# Patient Record
Sex: Male | Born: 1950 | Race: Black or African American | Hispanic: No | Marital: Married | Smoking: Former smoker
Health system: Southern US, Community
[De-identification: ages and names within clinical notes are randomized; demographics above are authoritative.]

## PROBLEM LIST (undated history)

## (undated) DIAGNOSIS — Z972 Presence of dental prosthetic device (complete) (partial): Secondary | ICD-10-CM

## (undated) DIAGNOSIS — R7303 Prediabetes: Secondary | ICD-10-CM

## (undated) DIAGNOSIS — K219 Gastro-esophageal reflux disease without esophagitis: Secondary | ICD-10-CM

## (undated) DIAGNOSIS — M1990 Unspecified osteoarthritis, unspecified site: Secondary | ICD-10-CM

## (undated) DIAGNOSIS — E7800 Pure hypercholesterolemia, unspecified: Secondary | ICD-10-CM

## (undated) DIAGNOSIS — E669 Obesity, unspecified: Secondary | ICD-10-CM

## (undated) DIAGNOSIS — I10 Essential (primary) hypertension: Secondary | ICD-10-CM

## (undated) DIAGNOSIS — K259 Gastric ulcer, unspecified as acute or chronic, without hemorrhage or perforation: Secondary | ICD-10-CM

## (undated) DIAGNOSIS — F1910 Other psychoactive substance abuse, uncomplicated: Secondary | ICD-10-CM

## (undated) DIAGNOSIS — H269 Unspecified cataract: Secondary | ICD-10-CM

## (undated) DIAGNOSIS — M2550 Pain in unspecified joint: Secondary | ICD-10-CM

## (undated) DIAGNOSIS — R1310 Dysphagia, unspecified: Secondary | ICD-10-CM

## (undated) DIAGNOSIS — K469 Unspecified abdominal hernia without obstruction or gangrene: Secondary | ICD-10-CM

## (undated) DIAGNOSIS — M549 Dorsalgia, unspecified: Secondary | ICD-10-CM

## (undated) DIAGNOSIS — T7840XA Allergy, unspecified, initial encounter: Secondary | ICD-10-CM

## (undated) DIAGNOSIS — F1010 Alcohol abuse, uncomplicated: Secondary | ICD-10-CM

## (undated) HISTORY — DX: Essential (primary) hypertension: I10

## (undated) HISTORY — DX: Other psychoactive substance abuse, uncomplicated: F19.10

## (undated) HISTORY — PX: JOINT REPLACEMENT: SHX530

## (undated) HISTORY — DX: Gastro-esophageal reflux disease without esophagitis: K21.9

## (undated) HISTORY — PX: REPLACEMENT TOTAL KNEE: SUR1224

## (undated) HISTORY — DX: Pain in unspecified joint: M25.50

## (undated) HISTORY — PX: UMBILICAL HERNIA REPAIR: SHX196

## (undated) HISTORY — DX: Prediabetes: R73.03

## (undated) HISTORY — DX: Unspecified osteoarthritis, unspecified site: M19.90

## (undated) HISTORY — DX: Dysphagia, unspecified: R13.10

## (undated) HISTORY — DX: Alcohol abuse, uncomplicated: F10.10

## (undated) HISTORY — DX: Gastric ulcer, unspecified as acute or chronic, without hemorrhage or perforation: K25.9

## (undated) HISTORY — PX: SHOULDER SURGERY: SHX246

## (undated) HISTORY — DX: Presence of dental prosthetic device (complete) (partial): Z97.2

## (undated) HISTORY — DX: Dorsalgia, unspecified: M54.9

## (undated) HISTORY — DX: Allergy, unspecified, initial encounter: T78.40XA

## (undated) HISTORY — PX: HERNIA REPAIR: SHX51

## (undated) HISTORY — DX: Unspecified abdominal hernia without obstruction or gangrene: K46.9

## (undated) HISTORY — PX: OTHER SURGICAL HISTORY: SHX169

---
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
## 2002-03-07 ENCOUNTER — Encounter: Payer: Self-pay | Admitting: Specialist

## 2002-03-11 ENCOUNTER — Inpatient Hospital Stay (HOSPITAL_COMMUNITY): Admission: RE | Admit: 2002-03-11 | Discharge: 2002-03-13 | Payer: Self-pay | Admitting: Orthopedic Surgery

## 2003-11-13 ENCOUNTER — Ambulatory Visit (HOSPITAL_COMMUNITY): Admission: RE | Admit: 2003-11-13 | Discharge: 2003-11-13 | Payer: Self-pay | Admitting: Gastroenterology

## 2003-11-13 ENCOUNTER — Encounter (INDEPENDENT_AMBULATORY_CARE_PROVIDER_SITE_OTHER): Payer: Self-pay | Admitting: *Deleted

## 2004-09-10 ENCOUNTER — Ambulatory Visit (HOSPITAL_COMMUNITY): Admission: RE | Admit: 2004-09-10 | Discharge: 2004-09-10 | Payer: Self-pay

## 2004-09-10 ENCOUNTER — Ambulatory Visit (HOSPITAL_BASED_OUTPATIENT_CLINIC_OR_DEPARTMENT_OTHER): Admission: RE | Admit: 2004-09-10 | Discharge: 2004-09-10 | Payer: Self-pay

## 2005-02-07 ENCOUNTER — Emergency Department (HOSPITAL_COMMUNITY): Admission: EM | Admit: 2005-02-07 | Discharge: 2005-02-07 | Payer: Self-pay | Admitting: Emergency Medicine

## 2005-04-07 ENCOUNTER — Encounter: Admission: RE | Admit: 2005-04-07 | Discharge: 2005-04-07 | Payer: Self-pay | Admitting: Orthopedic Surgery

## 2008-12-28 ENCOUNTER — Inpatient Hospital Stay (HOSPITAL_COMMUNITY): Admission: RE | Admit: 2008-12-28 | Discharge: 2008-12-31 | Payer: Self-pay | Admitting: Orthopedic Surgery

## 2010-03-01 ENCOUNTER — Emergency Department (HOSPITAL_COMMUNITY)
Admission: EM | Admit: 2010-03-01 | Discharge: 2010-03-01 | Payer: Self-pay | Source: Home / Self Care | Admitting: Family Medicine

## 2010-03-04 LAB — POCT I-STAT, CHEM 8
BUN: 12 mg/dL (ref 6–23)
Calcium, Ion: 1.19 mmol/L (ref 1.12–1.32)
Chloride: 104 mEq/L (ref 96–112)
Creatinine, Ser: 1 mg/dL (ref 0.4–1.5)
Glucose, Bld: 96 mg/dL (ref 70–99)
HCT: 46 % (ref 39.0–52.0)
Hemoglobin: 15.6 g/dL (ref 13.0–17.0)
Potassium: 4 mEq/L (ref 3.5–5.1)
Sodium: 141 mEq/L (ref 135–145)
TCO2: 27 mmol/L (ref 0–100)

## 2010-05-22 LAB — URINALYSIS, ROUTINE W REFLEX MICROSCOPIC
Bilirubin Urine: NEGATIVE
Glucose, UA: NEGATIVE mg/dL
Hgb urine dipstick: NEGATIVE
Ketones, ur: NEGATIVE mg/dL
Nitrite: NEGATIVE
Protein, ur: NEGATIVE mg/dL
Specific Gravity, Urine: 1.017 (ref 1.005–1.030)
Urobilinogen, UA: 0.2 mg/dL (ref 0.0–1.0)
pH: 5.5 (ref 5.0–8.0)

## 2010-05-22 LAB — CBC
HCT: 31.5 % — ABNORMAL LOW (ref 39.0–52.0)
HCT: 32.2 % — ABNORMAL LOW (ref 39.0–52.0)
HCT: 35.5 % — ABNORMAL LOW (ref 39.0–52.0)
HCT: 44.2 % (ref 39.0–52.0)
Hemoglobin: 10.8 g/dL — ABNORMAL LOW (ref 13.0–17.0)
Hemoglobin: 11.3 g/dL — ABNORMAL LOW (ref 13.0–17.0)
Hemoglobin: 12.2 g/dL — ABNORMAL LOW (ref 13.0–17.0)
Hemoglobin: 15 g/dL (ref 13.0–17.0)
MCHC: 33.9 g/dL (ref 30.0–36.0)
MCHC: 34.4 g/dL (ref 30.0–36.0)
MCHC: 34.4 g/dL (ref 30.0–36.0)
MCHC: 35.1 g/dL (ref 30.0–36.0)
MCV: 84.5 fL (ref 78.0–100.0)
MCV: 84.9 fL (ref 78.0–100.0)
MCV: 85.8 fL (ref 78.0–100.0)
MCV: 86.1 fL (ref 78.0–100.0)
Platelets: 159 10*3/uL (ref 150–400)
Platelets: 174 10*3/uL (ref 150–400)
Platelets: 174 10*3/uL (ref 150–400)
Platelets: 176 10*3/uL (ref 150–400)
RBC: 3.67 MIL/uL — ABNORMAL LOW (ref 4.22–5.81)
RBC: 3.81 MIL/uL — ABNORMAL LOW (ref 4.22–5.81)
RBC: 4.12 MIL/uL — ABNORMAL LOW (ref 4.22–5.81)
RBC: 5.2 MIL/uL (ref 4.22–5.81)
RDW: 13 % (ref 11.5–15.5)
RDW: 13.1 % (ref 11.5–15.5)
RDW: 13.1 % (ref 11.5–15.5)
RDW: 14 % (ref 11.5–15.5)
WBC: 13 10*3/uL — ABNORMAL HIGH (ref 4.0–10.5)
WBC: 13.3 10*3/uL — ABNORMAL HIGH (ref 4.0–10.5)
WBC: 13.9 10*3/uL — ABNORMAL HIGH (ref 4.0–10.5)
WBC: 8.1 10*3/uL (ref 4.0–10.5)

## 2010-05-22 LAB — BASIC METABOLIC PANEL
BUN: 9 mg/dL (ref 6–23)
BUN: 9 mg/dL (ref 6–23)
CO2: 28 mEq/L (ref 19–32)
CO2: 29 mEq/L (ref 19–32)
Calcium: 8.4 mg/dL (ref 8.4–10.5)
Calcium: 8.7 mg/dL (ref 8.4–10.5)
Chloride: 102 mEq/L (ref 96–112)
Chloride: 103 mEq/L (ref 96–112)
Creatinine, Ser: 0.98 mg/dL (ref 0.4–1.5)
Creatinine, Ser: 0.99 mg/dL (ref 0.4–1.5)
GFR calc Af Amer: >60 mL/min (ref >60)
GFR calc Af Amer: >60 mL/min (ref >60)
GFR calc non Af Amer: >60 mL/min (ref >60)
GFR calc non Af Amer: >60 mL/min (ref >60)
Glucose, Bld: 136 mg/dL — ABNORMAL HIGH (ref 70–99)
Glucose, Bld: 171 mg/dL — ABNORMAL HIGH (ref 70–99)
Potassium: 4.1 mEq/L (ref 3.5–5.1)
Potassium: 4.9 mEq/L (ref 3.5–5.1)
Sodium: 133 mEq/L — ABNORMAL LOW (ref 135–145)
Sodium: 137 mEq/L (ref 135–145)

## 2010-05-22 LAB — COMPREHENSIVE METABOLIC PANEL
ALT: 29 U/L (ref 0–53)
AST: 30 U/L (ref 0–37)
Albumin: 4.5 g/dL (ref 3.5–5.2)
Alkaline Phosphatase: 68 U/L (ref 39–117)
BUN: 11 mg/dL (ref 6–23)
CO2: 29 mEq/L (ref 19–32)
Calcium: 9.7 mg/dL (ref 8.4–10.5)
Chloride: 104 mEq/L (ref 96–112)
Creatinine, Ser: 1.02 mg/dL (ref 0.4–1.5)
GFR calc Af Amer: >60 mL/min (ref >60)
GFR calc non Af Amer: >60 mL/min (ref >60)
Glucose, Bld: 122 mg/dL — ABNORMAL HIGH (ref 70–99)
Potassium: 4 mEq/L (ref 3.5–5.1)
Sodium: 139 mEq/L (ref 135–145)
Total Bilirubin: 1 mg/dL (ref 0.3–1.2)
Total Protein: 7.8 g/dL (ref 6.0–8.3)

## 2010-05-22 LAB — PROTIME-INR
INR: 1.08 (ref 0.00–1.49)
INR: 1.18 (ref 0.00–1.49)
INR: 1.81 — ABNORMAL HIGH (ref 0.00–1.49)
INR: 1.95 — ABNORMAL HIGH (ref 0.00–1.49)
Prothrombin Time: 13.9 seconds (ref 11.6–15.2)
Prothrombin Time: 14.9 seconds (ref 11.6–15.2)
Prothrombin Time: 20.8 seconds — ABNORMAL HIGH (ref 11.6–15.2)
Prothrombin Time: 22.1 seconds — ABNORMAL HIGH (ref 11.6–15.2)

## 2010-05-22 LAB — TYPE AND SCREEN
ABO/RH(D): O POS
Antibody Screen: NEGATIVE

## 2010-05-22 LAB — APTT: aPTT: 26 seconds (ref 24–37)

## 2010-05-22 LAB — ABO/RH: ABO/RH(D): O POS

## 2010-07-05 NOTE — H&P (Signed)
NAME:  Marvin [name], Marvin [name] NO.:  [id number]   MEDICAL RECORD NO.:  [id number]                   PATIENT TYPE:   LOCATION:                                       FACILITY:   PHYSICIAN:  Ronnell Guadalajara, M.D.                DATE OF BIRTH:   DATE OF ADMISSION:  03/11/2002  DATE OF DISCHARGE:                                HISTORY & PHYSICAL   CHIEF COMPLAINT:  Right knee pain.   HISTORY OF PRESENT ILLNESS:  The patient is a 60 year old male with a long  history of right knee pain.  He had surgery on his right knee back in 1980,  where he had chips removed from his knee arthroscopically.  He has had  continued pain since then.  He has pain constantly, and it has been worse in  the last 3-4 years.  Risks and benefits of the surgery have been discussed  with the patient, and the patient wishes to proceed.   PAST MEDICAL HISTORY:  1. 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  2. Narcotic addiction, none recently.  3. Hypertension.  4. Hypercholesterolemia.  5. Peptic ulcer disease.   PAST SURGICAL HISTORY:  1. Right knee.  2. Right shoulder ruptured biceps.   MEDICATIONS:  1. Lisinopril/HCTZ 20/25 mg 1 p.o. daily  2. Lipitor 10 mg 1 p.o. daily.   ALLERGIES:  No known drug allergies.  The patient states that he does not  want any narcotics due to the previous addiction.   SOCIAL HISTORY:  The patient denies any alcohol or tobacco use.  He is  separated.  He lives in a [place] house with two steps entering the house.   FAMILY HISTORY:  Mother with hypertension.  Father alcoholism.   REVIEW OF SYSTEMS:  GENERAL:  Denies fever, chills, night sweats, bleeding  tendencies.  CNS:  Positive blurry vision.  Denies seizures, headaches,  paralysis, and double vision.  RESPIRATORY:  Denies shortness of breath,  productive cough, hemoptysis.  CV:  Positive chest pain with anxiety.  Denies angina, orthopnea.  GI:  Denies nausea, vomiting, diarrhea,  constipation, or bloody stools.  GU:  Denies dysuria, hematuria, or  discharge.  MUSCULOSKELETAL:  As pertinent to HPI.   PHYSICAL EXAMINATION:  VITAL SIGNS:  Blood pressure 90/50, pulse 88,  respirations 16.  GENERAL:  Well-developed, well-nourished, 60 year old black male.  HEENT:  Normocephalic, atraumatic.  Pupils equal, round, and reactive to  light.  NECK:  Supple.  No carotid bruit noted.  CHEST:  Clear to auscultation bilateral.  No wheezes or crackles.  HEART:  Regular rate and rhythm.  No murmurs, rubs, or gallops.  ABDOMEN:  Soft, nontender, nondistended, positive bowel sounds x 4.  EXTREMITIES:  The patient has bilateral varus knees, Lachman negative, range  of motion to full extension.  No flexion contracture _________ in the  patellofemoral joint region.  SKIN:  No rashes  or lesions.   LABORATORY DATA:  MRI reveals advanced degenerative arthropathy in the  medial compartment with complete hyaline cartilage loss, prominent  osteophyte formation, and marrow edema affecting the medial femoral condyle  and medial tibial plateau.  The patient has had extensive medial  meniscectomy.  Mild chondromalacia in the lateral compartment.  Mild to  moderate chondromalacia in the patellofemoral joint.  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  Intraarticular loose body.   IMPRESSION:  1. Osteoarthritis of the left knee.  2. Narcotics addiction, cocaine addiction.  3. Hypertension.  4. Hypercholesterolemia.  5. Peptic ulcer disease.   PLAN:  The patient will be admitted to [facility] on March 11, 2002, and undergo right Repicci unicondylar knee arthroplasty by Ronnell Guadalajara, M.D.     Clarene Reamer, P.A.-C.                   Ronnell Guadalajara, M.D.    SW/MEDQ  D:  02/28/2002  T:  02/28/2002  Job:  [id number]

## 2010-07-05 NOTE — Op Note (Signed)
NAMEGIFFORD, Marvin [name]             ACCOUNT NO.:  [id number]   MEDICAL RECORD NO.:  [id number]          PATIENT TYPE:  AMB   LOCATION:  ENDO                         FACILITY:  [facility]   PHYSICIAN:  Anselmo Rod, M.D.  DATE OF BIRTH:  04-02-1950   DATE OF PROCEDURE:  11/13/2003  DATE OF DISCHARGE:                                 OPERATIVE REPORT   PROCEDURE PERFORMED:  Colonoscopy with cold biopsies times three.   ENDOSCOPIST:  Charna Elizabeth, M.D.   INSTRUMENT USED:  Olympus video colonoscope.   INDICATIONS FOR PROCEDURE:  The patient is a 60 year old African-American  male undergoing screening colonoscopy to rule out colonic polyps, masses,  etc.   PREPROCEDURE PREPARATION:  Informed consent was procured from the patient.  The patient was fasted for eight hours prior to the procedure and prepped  with a bottle of magnesium citrate and a gallon of GoLYTELY the night prior  to the procedure.   PREPROCEDURE PHYSICAL:  The patient had stable vital signs.  Neck supple.  Chest clear to auscultation.  S1 and S2 regular.  Abdomen soft with normal  bowel sounds.   DESCRIPTION OF PROCEDURE:  The patient was placed in left lateral decubitus  position and sedated with 100 mg of Demerol and 10 mg of Versed in slow  incremental doses.  Once the patient was adequately sedated and maintained  on low flow oxygen and continuous cardiac monitoring, the Olympus video  colonoscope was advanced from the rectum to the cecum.  The appendicular  orifice and ileocecal valve were clearly visualized and photographed.  There  was a significant amount of residual stool in the colon.  Multiple washes  were done.  There was evidence of sigmoid diverticulosis.  Three small  sessile polyps were biopsied from the rectosigmoid colon.  Small internal  hemorrhoids were seen on retroflexion.  No large masses or polyps were  identified.   IMPRESSION:  1.  Small nonbleeding internal hemorrhoids.  2.  Three small  sessile polyps biopsied from rectosigmoid colon.  3.  Sigmoid diverticulosis.   RECOMMENDATIONS:  1.  Await pathology results.  2.  Avoid all nonsteroidals including aspirin for the next two weeks.  3.  Outpatient followup in the next two weeks or earlier if need be.  4.  Brochures on diverticulosis along with a high fiber diet have been given      to the patient for his education.       JNM/MEDQ  D:  11/13/2003  T:  11/13/2003  Job:  [id number]   cc:   Olene Craven, M.D.  [address]  [address]  [place]  [place] [address]  Fax: [phone]

## 2010-07-05 NOTE — Op Note (Signed)
NAME:  OSEI, ANGER NO.:  [id number]   MEDICAL RECORD NO.:  [id number]                   PATIENT TYPE:  INP   LOCATION:  X007                                 FACILITY:  [facility]   PHYSICIAN:  Ronnell Guadalajara, M.D.                DATE OF BIRTH:  1950-12-13   DATE OF PROCEDURE:  03/11/2002  DATE OF DISCHARGE:                                 OPERATIVE REPORT   PREOPERATIVE DIAGNOSIS:  Degenerative arthritis, right knee, primarily  medial compartment.   POSTOPERATIVE DIAGNOSIS:  Degenerative arthritis, right knee, primarily  medial compartment.   OPERATIVE PROCEDURE:  Repicci Unicondylar knee replacement, medial joint.   SURGEON:  Deloris Ping. Montez Morita, M.D.   ASSISTANT:  Georges Lynch. Gioffre, M.D.   DESCRIPTION OF PROCEDURE:  After suitable general anesthesia, he was  positioned in the arthroscopy leg holder and prepped and draped routinely.  After exsanguination with an Esmarch, an upper thigh tourniquet was inflated  to 375 degrees of mercury.  An anteromedial incision is made, parapatellar  in nature.  Skin and subcutaneous tissue was elevated.  A capsule incision  follows that, and a one inch incision towards the medial side of the vastus  medialis proximally to help in folding back the flap.  A very tight and  thick medial collateral ligament throughout and a very degenerated medial  meniscus partially removed right off in the front.  The patella was then  freed up with periosteum and soft tissue and the medial facet removed with a  saw.  A posterior 9 mm of distal femur were removed with a saw.  Remaining  meniscus was removed under direct vision with the aid of a cauterizing  Bovie.  At this point, drill hole was made in the femur and then the tibia  at a 45 degree angle with distraction device inserted and applied.  The end  of the femur was rounded.  The burs were removed; the spurs were removed,  and then we proceeded to bur out the proximal  tibia, electing to take a 37  mm wide as the ideal size.  After it was burred, considerable adjustments, a  nice fit was achieved.  We then checked the alignment on the femur where it  would hit the tibial prosthesis in extension and flexion communicating with  the blue line between, did a trial with the femur, felt that the 54 mm was  ideal, put the guide on and positioned, put the two fixing pins in and used  it as a chisel to deepen its position.  Prior to doing that, we did debride  the femur back an additional 2-3 mm throughout.  A central hole was then  drilled in the guide, and a trial reduction was then carried out, putting  the femur in first and then slipping the two component in.  We tried both  the 6.5 mm thick and a 7.5 and  went with the 6.5.  The wound was then  thoroughly irrigated.  The back of the posterior capsule was injected with  0.25% Marcaine with adrenalin.  There was noted to be a large spur that had  been knocked off the back of the femur, and this was removed.  The cement  was mixed; components were cemented with the tibia first with the knee in a  good bit of flexion to get good fixation into it, positioning into it with a  sponge pack posteriorly to catch the extra cement and the femur second.  After the cement was removed, it was brought out and held in full extension  for a count of 15 and brought back and cement was then removed.  Towards the  end, a large piece of cement was noted to be outside posteromedially and was  removed, all of the excess cement being removed during this setting stage.  Sponge was removed from posterior as well as one that had been placed in the  suprapatellar pouch.  The femur was checked for patella cracking and looked  quite good and had beautiful stability and excellent extension and flexion,  moved quite well.  Thorough  irrigation of the wound with antibiotic solution, repair of the cut in the  vastus medialis and the capsule  with #1 PDS, 2-0 in the subcu, and staples  in the skin.  Marcaine 25% was injected throughout the tissue.  Wound was  closed over a Hemovac.  Ice packs, compression dressing, and was to go  recovery in good condition.                                               Ronnell Guadalajara, M.D.    PC/MEDQ  D:  03/11/2002  T:  03/11/2002  Job:  [id number]

## 2010-07-05 NOTE — Op Note (Signed)
[name], Marvin [name] NO.:  [id number]   MEDICAL RECORD NO.:  [id number]          PATIENT TYPE:  AMB   LOCATION:  NESC                         FACILITY:  [facility]   PHYSICIAN:  Lorre Munroe., M.D.DATE OF BIRTH:  1950-08-17   DATE OF PROCEDURE:  09/10/2004  DATE OF DISCHARGE:                                 OPERATIVE REPORT   PREOPERATIVE DIAGNOSES:  Epigastric and umbilical hernia.   POSTOPERATIVE DIAGNOSES:  Epigastric and umbilical hernia.   OPERATION:  Repair of epigastric and umbilical hernia.   SURGEON:  Lebron Conners, M.D.   ANESTHESIA:  General and local.   DESCRIPTION OF PROCEDURE:  After the patient was monitored and anesthetized  and had routine preparation and draping of the upper abdomen, I made a short  midline incision just above the umbilicus long enough to get above the  palpable epigastric hernia as well as approached the umbilical hernia. I cut  down through the fat until I came to the hernias and found this with very  complex epigastric hernia with three separate defects and an umbilical  hernia as well. I separated all of the hernias from the surrounding  subcutaneous tissues and then connected all of the incisions together  turning the defect into one long defect. I then mobilized the skin and  subcutaneous tissues in the plane just above the fascia for adequate  distance laterally and superiorly and caudad as well so that I could close  the defect with a running 2-0 Prolene suture with minimal tension. There was  good fascia present at all dissected areas. I then cut a piece of  polypropylene mesh to fit the dissected defect and sewed that in place with  running basting 2-0 Prolene suture. I felt that produced a good hernia  repair. I thoroughly anesthetized the operative area with long-acting local  anesthetic. I sewed the umbilical skin down to the mesh with a single 3-0  Vicryl suture. I placed a suction drain through a separate  small stab  incision and laid it on top of the mesh. I then closed the subcutaneous  tissues with running 3-0 Vicryl suture and closed the skin with interrupted  buried intracuticular 4-0 Vicryl and Steri-Strips. He tolerated the  operation well.       WB/MEDQ  D:  09/10/2004  T:  09/10/2004  Job:  [id number]   cc:   Olene Craven, M.D.  [address]  [address]  [place]  [place] [address]  Fax: [phone]

## 2010-08-15 ENCOUNTER — Encounter (HOSPITAL_COMMUNITY)
Admission: RE | Admit: 2010-08-15 | Discharge: 2010-08-15 | Disposition: A | Discharge status: Home / Self Care | Payer: 59 | Source: Ambulatory Visit | Attending: General Surgery | Admitting: General Surgery

## 2010-08-15 ENCOUNTER — Other Ambulatory Visit (INDEPENDENT_AMBULATORY_CARE_PROVIDER_SITE_OTHER): Payer: Self-pay | Admitting: General Surgery

## 2010-08-15 ENCOUNTER — Ambulatory Visit (HOSPITAL_COMMUNITY)
Admission: RE | Admit: 2010-08-15 | Discharge: 2010-08-15 | Disposition: A | Discharge status: Home / Self Care | Payer: 59 | Source: Ambulatory Visit | Attending: General Surgery | Admitting: General Surgery

## 2010-08-15 DIAGNOSIS — K432 Incisional hernia without obstruction or gangrene: Secondary | ICD-10-CM

## 2010-08-15 DIAGNOSIS — Z01812 Encounter for preprocedural laboratory examination: Secondary | ICD-10-CM | POA: Insufficient documentation

## 2010-08-15 DIAGNOSIS — Z01818 Encounter for other preprocedural examination: Secondary | ICD-10-CM | POA: Insufficient documentation

## 2010-08-15 DIAGNOSIS — Z01811 Encounter for preprocedural respiratory examination: Secondary | ICD-10-CM | POA: Insufficient documentation

## 2010-08-15 LAB — BASIC METABOLIC PANEL
BUN: 16 mg/dL (ref 6–23)
CO2: 25 mEq/L (ref 19–32)
Calcium: 8.8 mg/dL (ref 8.4–10.5)
Chloride: 105 mEq/L (ref 96–112)
Creatinine, Ser: 1.05 mg/dL (ref 0.50–1.35)
GFR calc Af Amer: >60 mL/min (ref >60)
GFR calc non Af Amer: >60 mL/min (ref >60)
Glucose, Bld: 98 mg/dL (ref 70–99)
Potassium: 4.5 mEq/L (ref 3.5–5.1)
Sodium: 140 mEq/L (ref 135–145)

## 2010-08-15 LAB — SURGICAL PCR SCREEN
MRSA, PCR: NEGATIVE
Staphylococcus aureus: NEGATIVE

## 2010-08-15 LAB — CBC
HCT: 43 % (ref 39.0–52.0)
Hemoglobin: 15.4 g/dL (ref 13.0–17.0)
MCH: 28.8 pg (ref 26.0–34.0)
MCHC: 35.8 g/dL (ref 30.0–36.0)
MCV: 80.4 fL (ref 78.0–100.0)
Platelets: 203 10*3/uL (ref 150–400)
RBC: 5.35 MIL/uL (ref 4.22–5.81)
RDW: 13.8 % (ref 11.5–15.5)
WBC: 8.8 10*3/uL (ref 4.0–10.5)

## 2010-08-15 LAB — DIFFERENTIAL
Basophils Absolute: 0 10*3/uL (ref 0.0–0.1)
Basophils Relative: 0 % (ref 0–1)
Eosinophils Absolute: 0.2 10*3/uL (ref 0.0–0.7)
Eosinophils Relative: 2 % (ref 0–5)
Lymphocytes Relative: 22 % (ref 12–46)
Lymphs Abs: 1.9 10*3/uL (ref 0.7–4.0)
Monocytes Absolute: 0.5 10*3/uL (ref 0.1–1.0)
Monocytes Relative: 6 % (ref 3–12)
Neutro Abs: 6.2 10*3/uL (ref 1.7–7.7)
Neutrophils Relative %: 71 % (ref 43–77)

## 2010-08-19 ENCOUNTER — Ambulatory Visit (HOSPITAL_COMMUNITY)
Admission: RE | Admit: 2010-08-19 | Discharge: 2010-08-19 | Disposition: A | Discharge status: Home / Self Care | Payer: 59 | Source: Ambulatory Visit | Attending: General Surgery | Admitting: General Surgery

## 2010-08-19 DIAGNOSIS — K430 Incisional hernia with obstruction, without gangrene: Secondary | ICD-10-CM | POA: Insufficient documentation

## 2010-08-19 DIAGNOSIS — M109 Gout, unspecified: Secondary | ICD-10-CM | POA: Insufficient documentation

## 2010-08-19 DIAGNOSIS — K432 Incisional hernia without obstruction or gangrene: Secondary | ICD-10-CM

## 2010-08-19 DIAGNOSIS — Z01818 Encounter for other preprocedural examination: Secondary | ICD-10-CM | POA: Insufficient documentation

## 2010-08-19 DIAGNOSIS — Z87891 Personal history of nicotine dependence: Secondary | ICD-10-CM | POA: Insufficient documentation

## 2010-08-19 DIAGNOSIS — I10 Essential (primary) hypertension: Secondary | ICD-10-CM | POA: Insufficient documentation

## 2010-08-19 DIAGNOSIS — Z01812 Encounter for preprocedural laboratory examination: Secondary | ICD-10-CM | POA: Insufficient documentation

## 2010-08-19 DIAGNOSIS — Z01810 Encounter for preprocedural cardiovascular examination: Secondary | ICD-10-CM | POA: Insufficient documentation

## 2010-08-20 NOTE — Op Note (Signed)
NAMELAVARR, PRESIDENT NO.:  [id number]  MEDICAL RECORD NO.:  [id number]  LOCATION:  5151                         FACILITY:  [facility]  PHYSICIAN:  Cherylynn Ridges, M.D.    DATE OF BIRTH:  09-Jul-1950  DATE OF PROCEDURE:  08/19/2010 DATE OF DISCHARGE:  08/19/2010                              OPERATIVE REPORT   PREOPERATIVE DIAGNOSIS:  Incisional ventral hernia.  POSTOPERATIVE DIAGNOSIS:  Incisional ventral hernia.  PROCEDURE:  Laparoscopic ventral hernia repair x2.  SURGEON:  Cherylynn Ridges, MD  Physio mesh was used in repair.  ASSISTANT:  Dr. Andrey Campanile  ANESTHESIA:  General endotracheal.  ESTIMATED BLOOD LOSS:  Less than 50 mL.  COMPLICATIONS:  None.  CONDITION:  Stable.  FINDINGS:  The primary defect was in the midportion of the incision about 3 cm defect and in the superior aspect of the abdominal wall near the falciform ligament.  There was also 2 cm defect that was repaired separately.  INDICATIONS FOR OPERATION:  The patient is a 60 year old gentleman with a ventral hernia that is symptomatic with incarcerated fat.  OPERATION:  The patient was taken to the operating room, placed on table in supine position.  After an adequate general and endotracheal anesthetic was administered, he was prepped and draped in usual sterile manner exposing his entire abdomen.  After appropriate time-out was performed identifying the patient and the procedure to be performed, we made our initial left upper quadrant transverse incision about a 1.5 cm long into the subcutaneous tissue. We then used an OptiVu cannula 11 and 12 mm cannula with an inserter, laparoscope was attached with camera in light source and slowly entered the peritoneal cavity safely using the OptiVu.  Once we were in the peritoneal cavity, we insufflated carbon dioxide gas up to a pressure of 50 mmHg.  The right side was tilted down somewhat.  After we inserted the laparoscope with attached camera  in light source, we placed our right lower quadrant 5-mm cannula and the left lower quadrant 5-mm cannula under direct vision into the peritoneal cavity. We then dissected out the omentum and fat that was in the hernia defect in the midportion of the incision.  Electrocautery and laparoscopic scissors were used for this procedure.  Once we had done so, we also took down the falciform ligament up towards the liver in order to gain adequate access while placement of mesh.  After we identified the defect, there was still some omentum and fatty tissue attached to the posterior portion of what was previously placed a piece of mesh.  We dissected this and subsequently retrieved using an EndoCatch bag from the cannula site in the left upper quadrant.  Once we had cleaned off the previous fascia and also the defect, we measured the edges of the previous fascia using a spinal needle, marking pen and came out that we needed a 15 x 20 cm piece of Physio mesh in order to cover the defect.  It was not noticed at that time just above where we had measured there was a small 2 cm defect that we subsequently had to cover.  We used 0 Novafil sutures in aid, equally spaced placed on  the edge of the Physio mesh and then inserted it through the left upper quadrant cannula.  We then used a suture retriever through equally spaced transverse incision about 0.5 cm in size and pulled it up to the abdominal wall.  We tied these down and subsequently tacked it using a secure strap tacker circumferentially.  It was during the portion of the tacking in the upper portion that we noticed 2 cm defect.  This was indeed a separate hernia defect from the previous one.  We used a 6 x 4 cm piece of Physio mesh in order to cover this defect without any retention sutures.  We passed it through the left upper quadrant site and then tacked it superior lateral and circumferentially around the defect using a secure strap tacker.   With this all hernia defects were covered.  There was minimal bleeding.  As mentioned previously, we retrieved the fat from the left upper quadrant site.  Once that was done, we allowed the gas to escape and we closed.  The only skin suture was in the left upper quadrant site what we closed using running subcuticular stitch of 4-0 Monocryl.  We injected 0.25% Marcaine with epi at all sites.  We used Dermabond, Steri-Strips and Tegaderm to complete all dressings.  All needle counts, sponge counts and instrument counts were correct.     Cherylynn Ridges, M.D.     JOW/MEDQ  D:  08/19/2010  T:  08/19/2010  Job:  [id number]  cc:   Merlene Laughter. Renae Gloss, M.D.  Electronically Signed by Jimmye Norman M.D. on 08/20/2010 08:20:47 AM

## 2010-08-27 ENCOUNTER — Telehealth (INDEPENDENT_AMBULATORY_CARE_PROVIDER_SITE_OTHER): Payer: Self-pay

## 2010-08-27 MED ORDER — HYDROCODONE-ACETAMINOPHEN 5-325 MG PO TABS: 1.0000 | ORAL_TABLET | Freq: Four times a day (QID) | ORAL | Status: AC | PRN

## 2010-08-27 NOTE — Telephone Encounter (Signed)
Per protocol faxed to [facility] [phone]

## 2010-09-03 ENCOUNTER — Ambulatory Visit (INDEPENDENT_AMBULATORY_CARE_PROVIDER_SITE_OTHER): Payer: 59 | Admitting: General Surgery

## 2010-09-03 ENCOUNTER — Encounter (INDEPENDENT_AMBULATORY_CARE_PROVIDER_SITE_OTHER): Payer: Self-pay | Admitting: General Surgery

## 2010-09-03 VITALS — BP 170/116 | HR 88 | Temp 96.2°F

## 2010-09-03 DIAGNOSIS — Z09 Encounter for follow-up examination after completed treatment for conditions other than malignant neoplasm: Secondary | ICD-10-CM | POA: Insufficient documentation

## 2010-09-03 NOTE — Progress Notes (Signed)
HPI The patient is status post laparoscopic ventral hernia repair. On examination he has no evidence of recurrence. At the time of surgery he had 2 hernias one around the umbilicus and one in the subxiphoid area.  PE His exam is lifting for no evidence of recurrent hernia. He felt as on the right side the inferior periumbilical area they had a bulge of right did not see evidence of recurrent hernia in that area. I will prescribe him an abdominal binder again.  Studiy review There were no studies to review today.  Assessment Status post laparoscopic ventral hernia repair with no evidence for recurrence.  Plan Prescribed the patient another abdominal binder. He is to return to see me on a p.r.n. basis

## 2010-09-05 ENCOUNTER — Other Ambulatory Visit (INDEPENDENT_AMBULATORY_CARE_PROVIDER_SITE_OTHER): Payer: Self-pay

## 2010-09-05 DIAGNOSIS — K469 Unspecified abdominal hernia without obstruction or gangrene: Secondary | ICD-10-CM

## 2010-09-09 MED ORDER — OXYCODONE-ACETAMINOPHEN 5-325 MG PO TABS: 1.0000 | ORAL_TABLET | Freq: Four times a day (QID) | ORAL | Status: DC | PRN

## 2010-10-01 ENCOUNTER — Encounter (INDEPENDENT_AMBULATORY_CARE_PROVIDER_SITE_OTHER): Payer: Self-pay

## 2011-01-06 ENCOUNTER — Other Ambulatory Visit: Payer: Self-pay

## 2011-01-06 ENCOUNTER — Encounter (HOSPITAL_COMMUNITY): Payer: Self-pay | Admitting: *Deleted

## 2011-01-06 ENCOUNTER — Emergency Department (HOSPITAL_COMMUNITY): Payer: 59

## 2011-01-06 ENCOUNTER — Emergency Department (HOSPITAL_COMMUNITY)
Admission: EM | Admit: 2011-01-06 | Discharge: 2011-01-06 | Disposition: A | Discharge status: Home / Self Care | Payer: 59 | Attending: Emergency Medicine | Admitting: Emergency Medicine

## 2011-01-06 DIAGNOSIS — I10 Essential (primary) hypertension: Secondary | ICD-10-CM | POA: Insufficient documentation

## 2011-01-06 DIAGNOSIS — R079 Chest pain, unspecified: Secondary | ICD-10-CM | POA: Insufficient documentation

## 2011-01-06 DIAGNOSIS — R0602 Shortness of breath: Secondary | ICD-10-CM | POA: Insufficient documentation

## 2011-01-06 DIAGNOSIS — E7800 Pure hypercholesterolemia, unspecified: Secondary | ICD-10-CM | POA: Insufficient documentation

## 2011-01-06 HISTORY — DX: Pure hypercholesterolemia, unspecified: E78.00

## 2011-01-06 LAB — POCT I-STAT TROPONIN I
Troponin i, poc: 0 ng/mL (ref 0.00–0.08)
Troponin i, poc: 0.04 ng/mL (ref 0.00–0.08)
Troponin i, poc: 0.01 ng/mL (ref 0.00–0.08)

## 2011-01-06 LAB — BASIC METABOLIC PANEL
BUN: 14 mg/dL (ref 6–23)
CO2: 26 mEq/L (ref 19–32)
Calcium: 9.5 mg/dL (ref 8.4–10.5)
Chloride: 106 mEq/L (ref 96–112)
Creatinine, Ser: 0.97 mg/dL (ref 0.50–1.35)
GFR calc Af Amer: >90 mL/min (ref >90)
GFR calc non Af Amer: 90 mL/min — ABNORMAL LOW (ref >90)
Glucose, Bld: 96 mg/dL (ref 70–99)
Potassium: 4 mEq/L (ref 3.5–5.1)
Sodium: 139 mEq/L (ref 135–145)

## 2011-01-06 LAB — CBC
HCT: 40.9 % (ref 39.0–52.0)
Hemoglobin: 13.9 g/dL (ref 13.0–17.0)
MCH: 27.9 pg (ref 26.0–34.0)
MCHC: 34 g/dL (ref 30.0–36.0)
MCV: 82 fL (ref 78.0–100.0)
Platelets: 177 10*3/uL (ref 150–400)
RBC: 4.99 MIL/uL (ref 4.22–5.81)
RDW: 14.6 % (ref 11.5–15.5)
WBC: 9.5 10*3/uL (ref 4.0–10.5)

## 2011-01-06 LAB — APTT: aPTT: 27 seconds (ref 24–37)

## 2011-01-06 LAB — PRO B NATRIURETIC PEPTIDE: Pro B Natriuretic peptide (BNP): 17.1 pg/mL (ref 0–125)

## 2011-01-06 LAB — PROTIME-INR
INR: 1.06 (ref 0.00–1.49)
Prothrombin Time: 14 seconds (ref 11.6–15.2)

## 2011-01-06 LAB — D-DIMER, QUANTITATIVE: D-Dimer, Quant: 1.25 ug/mL-FEU — ABNORMAL HIGH (ref 0.00–0.48)

## 2011-01-06 MED ORDER — IOHEXOL 300 MG/ML  SOLN
100.0000 mL | Freq: Once | INTRAMUSCULAR | Status: AC | PRN
  Administered 2011-01-06: 100 mL via INTRAVENOUS

## 2011-01-06 MED ORDER — FENTANYL CITRATE 0.05 MG/ML IJ SOLN
50.0000 ug | Freq: Once | INTRAMUSCULAR | Status: AC
Start: 2011-01-06 — End: 2011-01-06
  Administered 2011-01-06: 50 ug via INTRAVENOUS
  Filled 2011-01-06: qty 2

## 2011-01-06 MED ORDER — ASPIRIN 81 MG PO CHEW
162.0000 mg | CHEWABLE_TABLET | Freq: Once | ORAL | Status: AC
  Administered 2011-01-06: 162 mg via ORAL
  Filled 2011-01-06: qty 2

## 2011-01-06 MED ORDER — OXYCODONE-ACETAMINOPHEN 5-325 MG PO TABS: ORAL_TABLET | ORAL | Status: DC

## 2011-01-06 MED ORDER — NITROGLYCERIN 0.4 MG SL SUBL
0.4000 mg | SUBLINGUAL_TABLET | SUBLINGUAL | Status: DC | PRN
  Administered 2011-01-06 (×2): 0.4 mg via SUBLINGUAL
  Filled 2011-01-06: qty 25

## 2011-01-06 MED ORDER — KETOROLAC TROMETHAMINE 30 MG/ML IJ SOLN
30.0000 mg | Freq: Once | INTRAMUSCULAR | Status: AC
  Administered 2011-01-06: 30 mg via INTRAVENOUS
  Filled 2011-01-06: qty 1

## 2011-01-06 MED ORDER — SODIUM CHLORIDE 0.9 % IV SOLN
Freq: Once | INTRAVENOUS | Status: AC
  Administered 2011-01-06 (×2): via INTRAVENOUS

## 2011-01-06 MED ORDER — NITROGLYCERIN 0.4 MG SL SUBL
SUBLINGUAL_TABLET | SUBLINGUAL | Status: AC
  Administered 2011-01-06: 0.4 mg via SUBLINGUAL
  Filled 2011-01-06: qty 25

## 2011-01-06 NOTE — Discharge Instructions (Signed)
 Chest Pain (Nonspecific) It is often hard to give a specific diagnosis for the cause of chest pain. There is always a chance that your pain could be related to something serious, such as a heart attack or a blood clot in the lungs. You need to follow up with your caregiver for further evaluation. CAUSES   Heartburn.   Pneumonia or bronchitis.   Anxiety and stress.   Inflammation around your heart (pericarditis) or lung (pleuritis or pleurisy).   A blood clot in the lung.   A collapsed lung (pneumothorax). It can develop suddenly on its own (spontaneous pneumothorax) or from injury (trauma) to the chest.  The chest wall is composed of bones, muscles, and cartilage. Any of these can be the source of the pain.  The bones can be bruised by injury.   The muscles or cartilage can be strained by coughing or overwork.   The cartilage can be affected by inflammation and become sore (costochondritis).  DIAGNOSIS  Lab tests or other studies, such as X-rays, an EKG, stress testing, or cardiac imaging, may be needed to find the cause of your pain.  TREATMENT   Treatment depends on what may be causing your chest pain. Treatment may include:   Acid blockers for heartburn.   Anti-inflammatory medicine.   Pain medicine for inflammatory conditions.   Antibiotics if an infection is present.   You may be advised to change lifestyle habits. This includes stopping smoking and avoiding caffeine and chocolate.   You may be advised to keep your head raised (elevated) when sleeping. This reduces the chance of acid going backward from your stomach into your esophagus.   Most of the time, nonspecific chest pain will improve within 2 to 3 days with rest and mild pain medicine.  HOME CARE INSTRUCTIONS   If antibiotics were prescribed, take the full amount even if you start to feel better.   For the next few days, avoid physical activities that bring on chest pain. Continue physical activities as  directed.   Do not smoke cigarettes or drink alcohol  until your symptoms are gone.   Only take over-the-counter or prescription medicine for pain, discomfort, or fever as directed by your caregiver.   Follow your caregiver's suggestions for further testing if your chest pain does not go away.   Keep any follow-up appointments you made. If you do not go to an appointment, you could develop lasting (chronic) problems with pain. If there is any problem keeping an appointment, you must call to reschedule.  SEEK MEDICAL CARE IF:   You think you are having problems from the medicine you are taking. Read your medicine instructions carefully.   Your chest pain does not go away, even after treatment.   You develop a rash with blisters on your chest.  SEEK IMMEDIATE MEDICAL CARE IF:   You have increased chest pain or pain that spreads to your arm, neck, jaw, back, or belly (abdomen).   You develop shortness of breath, an increasing cough, or you are coughing up blood.   You have severe back or abdominal pain, feel sick to your stomach (nauseous) or throw up (vomit).   You develop severe weakness, fainting, or chills.   You have an oral temperature above 102 F (38.9 C), not controlled by medicine.  THIS IS AN EMERGENCY. Do not wait to see if the pain will go away. Get medical help at once. Call your local emergency services (911 in [place] Do not drive yourself to  the hospital. MAKE SURE YOU:   Understand these instructions.   Will watch your condition.   Will get help right away if you are not doing well or get worse.  Document Released: 11/13/2004 Document Revised: 10/16/2010 Document Reviewed: 09/09/2007 [facility] Patient Information 2012 [place], [place].     Narcotic and benzodiazepine use may cause drowsiness, slowed breathing or dependence.  Please use with caution and do not drive, operate machinery or watch young children alone while taking them.  Taking combinations of these  medications or drinking alcohol  will potentiate these effects.      Your blood tests for heart damage and your CT scan showed no immediate abnormalities.

## 2011-01-06 NOTE — ED Notes (Signed)
Patient transported to X-ray 

## 2011-01-06 NOTE — ED Notes (Signed)
Pt c/o chest pain starting yesterday evening, shortness of breath starting @ midnight. Pt describes pain as sharp and shooting, radiating from lateral left chest to back.

## 2011-01-06 NOTE — ED Notes (Signed)
Ghim, MD at bedside. 

## 2011-01-06 NOTE — ED Notes (Signed)
Pt reports left upper chest pain radiating to back that began yesterday evening approx 22:00 - pt was at rest - admits to increased pain w/ movement and inspiration. Pt denies n/v, diaphoresis, or dizziness. Pt admits to recent domestic travel. Pt resting comfortably on bed in no acute distress, family at bedside.

## 2011-01-06 NOTE — ED Provider Notes (Addendum)
History     CSN: [id number] Arrival date & time: 01/06/2011  3:09 AM   First MD Initiated Contact with Patient 01/06/11 [phone]      Chief Complaint  Patient presents with  . Chest Pain    reproducible w/ palpation and inspiration  . Shortness of Breath    (Consider location/radiation/quality/duration/timing/severity/associated sxs/prior treatment) HPI Comments: Patient presents to the emergency department due to intermittent sharp left-sided chest pains. It does not radiate. He reports shortness of breath. There is no pleuritic component. No nausea vomiting or diarrhea. Patient has had some mild constipation for 2 days which is not any different from usual. Of note he did travel to [facility] to attend a funeral on Friday and arrived back home yesterday. He denies any calf tenderness, lower extremity with numbness or swelling. He denies history of cardiac disease. He denies any recent URI symptoms, cough. He denies any back pain. He did take one baby aspirin prior to arrival. He also recently had a ventral hernia surgery in July of this past year. He had a right knee replacement about 2 years ago. He denies smoking. He has significant history of hypertension and hyperlipidemia. He reports that his grandfather had a heart attack when he was approximately 60 years old but no other family members with a history of cardiac disease.  The history is provided by the patient.    Past Medical History  Diagnosis Date  . Wears dentures     bottom front  . Hypertension   . Hernia   . Arthritis   . Hypercholesteremia   . Gout     Past Surgical History  Procedure Date  . Shoulder surgery     right  . Replacement total knee     partial  . Hernia repair     History reviewed. No pertinent family history.  History  Substance Use Topics  . Smoking status: Former Games developer  . Smokeless tobacco: Not on file  . Alcohol Use: No      Review of Systems  Constitutional: Negative.   HENT:  Negative for congestion and rhinorrhea.   Respiratory: Positive for shortness of breath. Negative for cough.   Cardiovascular: Positive for chest pain. Negative for leg swelling.  Musculoskeletal: Negative for back pain.  All other systems reviewed and are negative.    Allergies  Morphine and related  Home Medications   Current Outpatient Rx  Name Route Sig Dispense Refill  . AMLODIPINE BESYLATE 5 MG PO TABS Oral Take 5 mg by mouth daily.      . ASPIRIN 81 MG PO TABS Oral Take 81 mg by mouth daily.      Marland Kitchen SIMVASTATIN 40 MG PO TABS Oral Take 40 mg by mouth at bedtime.      . ALLOPURINOL PO Oral Take by mouth daily.      Marland Kitchen LOTREL PO Oral Take by mouth daily.      . AMOXICILLIN 500 MG PO CAPS      . VYTORIN PO Oral Take by mouth daily.      Marland Kitchen FLUTICASONE PROPIONATE 50 MCG/ACT NA SUSP      . OXYCODONE-ACETAMINOPHEN 5-325 MG PO TABS Oral Take 1 tablet by mouth every 6 (six) hours as needed for pain. 20 tablet 0    BP 129/91  Pulse 58  Resp 23  Ht 5\' 11"  (1.803 m)  Wt 246 lb 0.5 oz (111.6 kg)  BMI 34.31 kg/m2  SpO2 100%  Physical Exam  Nursing note and  vitals reviewed. Constitutional: He is oriented to person, place, and time. He appears well-developed and well-nourished. No distress.  HENT:  Head: Normocephalic and atraumatic.  Eyes: Pupils are equal, round, and reactive to light.  Neck: Neck supple.  Cardiovascular: Normal rate and regular rhythm.   Pulmonary/Chest: Effort normal. He has no wheezes. He has no rales.  Abdominal: Soft. There is no tenderness.  Musculoskeletal: He exhibits no edema and no tenderness.  Neurological: He is alert and oriented to person, place, and time.  Skin: Skin is warm and dry. No rash noted. He is not diaphoretic.  Psychiatric: He has a normal mood and affect.    ED Course  Procedures (including critical care time)   Labs Reviewed  CBC  BASIC METABOLIC PANEL  PRO B NATRIURETIC PEPTIDE  I-STAT TROPONIN I  D-DIMER, QUANTITATIVE    APTT  PROTIME-INR   Dg Chest 2 View  01/06/2011  *RADIOLOGY REPORT*  Clinical Data: Sudden onset of chest pain.  CHEST - 2 VIEW  Comparison: 08/15/2010  Findings: No significant change from prior.  Hypoaeration results in interstitial crowding.  Mildly tortuous thoracic aorta.  The cardiac contour upper normal limits to mildly enlarged.  Elevated left hemidiaphragm.  No pleural effusion or pneumothorax.  Plate like opacity within the right mid lung may reflect atelectasis.  No acute osseous abnormality.  IMPRESSION: Hypoaeration results in interstitial crowding and mild atelectasis.  Heart size upper normal limits to mildly enlarged.  Original Report Authenticated By: Waneta Martins, M.D.     No diagnosis found.    MDM   Are reviewed patient's chest x-ray. Per radiologist there is no acute abnormality except for mild atelectasis. Heart size was noted to be slightly enlarged. Radiologist also mentioned a mildly tortuous thoracic aorta. Patient's symptoms are atypical in that it is sharp in nature. It occurred while he was at rest. His EKG here shows no acute abnormalities. The EKG was done at 03:17. It shows a sinus rhythm at rate 59 with normal axis, normal intervals, no ST or T wave changes. There no prior EKGs noted. This is a normal EKG. Although there is no pleuritic pain given his recent long distance travel, d-dimer is also added. His room air saturation was 100% which is normal. Patient is given additional aspirin and nitroglycerin and will continue to monitor him. He'll be reassessed for pain improvement. His primary care physician is Dr. Andi Devon.      6:52 AM CT chest angio shows no PE.  Initial troponin is neg.  Pt with some improvement with NTG and IV fentanyl.  Continued mild CP.  Some risk factors for CAD.  Pt's pain is improved and now seems worse with movement.  Pain seems to be more reproducible.  I think given this and the sharp nature of the pain, this is more  likely muscular.  Will get a second troponin, if neg, will d/c home and close follow up with PCP.     08:00 AM Pt's second troponin i stat is up from 0.0 to 0.04.  Slight rise, will get third troponin.  Suspicion is low as it is reproducible.  Pt discussed with Dr. Hyman Hopes.    Gavin Pound. Oletta Lamas, MD 01/08/11 1055  Gavin Pound. Oletta Lamas, MD 03/06/11 2027

## 2011-01-06 NOTE — ED Notes (Signed)
Assumption of care from Dr. Oletta Lamas.  60 y.o. male presents with  Chest pain unclear etiology. Dispo pending 3rd troponin. Trop negative x 3. Will discharge with plan set forth by Dr. Oletta Lamas- Percocet, PMD FU  BP 135/71  Pulse 60  Temp 97.6 F (36.4 C)  Resp 19  Ht 5\' 11"  (1.803 m)  Wt 246 lb 0.5 oz (111.6 kg)  BMI 34.31 kg/m2  SpO2 100%  Results for orders placed during the hospital encounter of 01/06/11  CBC      Component Value Range   WBC 9.5  4.0 - 10.5 (K/uL)   RBC 4.99  4.22 - 5.81 (MIL/uL)   Hemoglobin 13.9  13.0 - 17.0 (g/dL)   HCT 46.9  62.9 - 52.8 (%)   MCV 82.0  78.0 - 100.0 (fL)   MCH 27.9  26.0 - 34.0 (pg)   MCHC 34.0  30.0 - 36.0 (g/dL)   RDW 41.3  24.4 - 01.0 (%)   Platelets 177  150 - 400 (K/uL)  BASIC METABOLIC PANEL      Component Value Range   Sodium 139  135 - 145 (mEq/L)   Potassium 4.0  3.5 - 5.1 (mEq/L)   Chloride 106  96 - 112 (mEq/L)   CO2 26  19 - 32 (mEq/L)   Glucose, Bld 96  70 - 99 (mg/dL)   BUN 14  6 - 23 (mg/dL)   Creatinine, Ser 2.72  0.50 - 1.35 (mg/dL)   Calcium 9.5  8.4 - 53.6 (mg/dL)   GFR calc non Af Amer 90 (*) >90 (mL/min)   GFR calc Af Amer >90  >90 (mL/min)  PRO B NATRIURETIC PEPTIDE      Component Value Range   BNP, POC 17.1  0 - 125 (pg/mL)  D-DIMER, QUANTITATIVE      Component Value Range   D-Dimer, Quant 1.25 (*) 0.00 - 0.48 (ug/mL-FEU)  APTT      Component Value Range   aPTT 27  24 - 37 (seconds)  PROTIME-INR      Component Value Range   Prothrombin Time 14.0  11.6 - 15.2 (seconds)   INR 1.06  0.00 - 1.49   POCT I-STAT TROPONIN I      Component Value Range   Troponin i, poc 0.00  0.00 - 0.08 (ng/mL)   Comment 3           POCT I-STAT TROPONIN I      Component Value Range   Troponin i, poc 0.04  0.00 - 0.08 (ng/mL)   Comment 3           POCT I-STAT TROPONIN I      Component Value Range   Troponin i, poc 0.01  0.00 - 0.08 (ng/mL)   Comment 3            Dg Chest 2 View  01/06/2011  *RADIOLOGY REPORT*  Clinical  Data: Sudden onset of chest pain.  CHEST - 2 VIEW  Comparison: 08/15/2010  Findings: No significant change from prior.  Hypoaeration results in interstitial crowding.  Mildly tortuous thoracic aorta.  The cardiac contour upper normal limits to mildly enlarged.  Elevated left hemidiaphragm.  No pleural effusion or pneumothorax.  Plate like opacity within the right mid lung may reflect atelectasis.  No acute osseous abnormality.  IMPRESSION: Hypoaeration results in interstitial crowding and mild atelectasis.  Heart size upper normal limits to mildly enlarged.  Original Report Authenticated By: Waneta Martins, M.D.   Ct Angio  Chest W/cm &/or Wo Cm  01/06/2011  *RADIOLOGY REPORT*  Clinical Data:  Chest pain, shortness of breath.  CT ANGIOGRAPHY CHEST WITH CONTRAST  Technique:  Multidetector CT imaging of the chest was performed using the standard protocol during bolus administration of intravenous contrast.  Multiplanar CT image reconstructions including MIPs were obtained to evaluate the vascular anatomy.  Contrast: OMNIPAQUE IOHEXOL 300 MG/ML IV SOLN  Comparison:  01/06/2011 radiograph  Findings:   No main or lobar branch pulmonary embolism.  Suboptimal evaluation of segmental and subsegmental branches.  Tortuous thoracic aorta with scattered atherosclerotic calcification.  Heart size upper normal limits to mildly enlarged. Mild coronary artery calcification.  No pleural effusion.  No pericardial effusion.  No intrathoracic lymphadenopathy.  Central airways are patent.  Dependent atelectasis.  Mild interlobular septal prominence.  No pneumothorax. Mild apical emphysematous changes.  Limited images through the upper abdomen show no acute abnormality.  No acute osseous abnormality.  Review of the MIP images confirms the above findings.  IMPRESSION: No pulmonary embolism identified. Segmental and subsegmental branches are suboptimally evaluated due to contrast bolus timing.  Mild interlobular septal  prominence may represent chronic changes or mild edema pattern.  Mild bibasilar atelectasis.  Original Report Authenticated By: Waneta Martins, M.D.        Forbes Cellar, MD 01/06/11 1118

## 2011-01-06 NOTE — ED Notes (Signed)
Pt alert and oriented x4. Respirations even and unlabored, bilateral symmetrical rise and fall of chest. Skin warm and dry. In no acute distress. Denies needs.   

## 2011-01-06 NOTE — ED Notes (Signed)
Patient transported to CT 

## 2011-05-08 ENCOUNTER — Encounter (HOSPITAL_COMMUNITY): Payer: Self-pay

## 2011-05-08 ENCOUNTER — Emergency Department (INDEPENDENT_AMBULATORY_CARE_PROVIDER_SITE_OTHER)
Admission: EM | Admit: 2011-05-08 | Discharge: 2011-05-08 | Disposition: A | Discharge status: Home / Self Care | Payer: 59 | Source: Home / Self Care | Attending: Family Medicine | Admitting: Family Medicine

## 2011-05-08 DIAGNOSIS — L03019 Cellulitis of unspecified finger: Secondary | ICD-10-CM

## 2011-05-08 DIAGNOSIS — IMO0001 Reserved for inherently not codable concepts without codable children: Secondary | ICD-10-CM

## 2011-05-08 MED ORDER — DOXYCYCLINE HYCLATE 100 MG PO CAPS: 100.0000 mg | ORAL_CAPSULE | Freq: Two times a day (BID) | ORAL | Status: AC

## 2011-05-08 NOTE — ED Provider Notes (Signed)
History     CSN: [id number]  Arrival date & time 05/08/11  4098   First MD Initiated Contact with Patient 05/08/11 [phone]      Chief Complaint  Patient presents with  . Nail Problem    (Consider location/radiation/quality/duration/timing/severity/associated sxs/prior treatment) HPI Comments: The patient reports pulling a hangnail 2-3 days ago. Has swelling and pain of his left index finger tip x 2 days. Has been soaking in warm water. Denies injury. No hx of mrsa  The history is provided by the patient.    Past Medical History  Diagnosis Date  . Wears dentures     bottom front  . Hypertension   . Hernia   . Arthritis   . Hypercholesteremia   . Gout     Past Surgical History  Procedure Date  . Shoulder surgery     right  . Replacement total knee     partial  . Hernia repair     History reviewed. No pertinent family history.  History  Substance Use Topics  . Smoking status: Former Games developer  . Smokeless tobacco: Not on file  . Alcohol Use: No      Review of Systems  Constitutional: Negative.   HENT: Negative.   Respiratory: Negative.   Cardiovascular: Negative.   Gastrointestinal: Negative.   Genitourinary: Negative.     Allergies  Morphine and related  Home Medications   Current Outpatient Rx  Name Route Sig Dispense Refill  . AMLODIPINE BESYLATE 5 MG PO TABS Oral Take 5 mg by mouth daily.      . ASPIRIN 81 MG PO TABS Oral Take 81 mg by mouth daily.      Marland Kitchen FLUTICASONE PROPIONATE 50 MCG/ACT NA SUSP      . SIMVASTATIN 40 MG PO TABS Oral Take 40 mg by mouth at bedtime.      . ALLOPURINOL 300 MG PO TABS Oral Take 300 mg by mouth daily as needed. For gout episode     . LOTREL PO Oral Take by mouth daily.      . AMOXICILLIN 500 MG PO CAPS Oral Take 500 mg by mouth daily as needed. Before going to the dentist    . DOXYCYCLINE HYCLATE 100 MG PO CAPS Oral Take 1 capsule (100 mg total) by mouth 2 (two) times daily. 20 capsule 0  . VYTORIN PO Oral Take by  mouth daily.      . OXYCODONE-ACETAMINOPHEN 5-325 MG PO TABS  1-2 tablets po q 6 hours prn pain 20 tablet 0    BP 143/101  Pulse 87  Temp(Src) 98.9 F (37.2 C) (Oral)  Resp 16  SpO2 98%  Physical Exam  Nursing note and vitals reviewed. Constitutional: He appears well-developed and well-nourished.       Feel bp elevated due to pain and anxiety .  Cardiovascular: Normal rate.   Pulmonary/Chest: Effort normal and breath sounds normal.  Musculoskeletal:       Swelling and pain left index finger consistent with a paronychia. Opened with a sterile needle. Moderated amt of purulent discharge expressed. tol well. Dressing applied.     ED Course  Procedures (including critical care time)  Labs Reviewed - No data to display No results found.   1. Paronychia of second finger, left       MDM          Randa Spike, MD 05/08/11 [phone]

## 2011-05-08 NOTE — ED Notes (Signed)
C/o infection to left index finger for 2 days.  States red, painful and swollen.

## 2011-05-08 NOTE — Discharge Instructions (Signed)
Soak in warm water twice daily x 15 min. Apply a  Dressing as discussed. Follow up if any complication. Paronychia Paronychia is an inflammatory reaction involving the folds of the skin surrounding the fingernail. This is commonly caused by an infection in the skin around a nail. The most common cause of paronychia is frequent wetting of the hands (as seen with bartenders, food servers, nurses or others who wet their hands). This makes the skin around the fingernail susceptible to infection by bacteria (germs) or fungus. Other predisposing factors are:  Aggressive manicuring.   Nail biting.   Thumb sucking.  The most common cause is a staphylococcal (a type of germ) infection, or a fungal (Candida) infection. When caused by a germ, it usually comes on suddenly with redness, swelling, pus and is often painful. It may get under the nail and form an abscess (collection of pus), or form an abscess around the nail. If the nail itself is infected with a fungus, the treatment is usually prolonged and may require oral medicine for up to one year. Your caregiver will determine the length of time treatment is required. The paronychia caused by bacteria (germs) may largely be avoided by not pulling on hangnails or picking at cuticles. When the infection occurs at the tips of the finger it is called felon. When the cause of paronychia is from the herpes simplex virus (HSV) it is called herpetic whitlow. TREATMENT  When an abscess is present treatment is often incision and drainage. This means that the abscess must be cut open so the pus can get out. When this is done, the following home care instructions should be followed. HOME CARE INSTRUCTIONS   It is important to keep the affected fingers very dry. Rubber or plastic gloves over cotton gloves should be used whenever the hand must be placed in water.   Keep wound clean, dry and dressed as suggested by your caregiver between warm soaks or warm compresses.    Soak in warm water for fifteen to twenty minutes three to four times per day for bacterial infections. Fungal infections are very difficult to treat, so often require treatment for long periods of time.   For bacterial (germ) infections take antibiotics (medicine which kill germs) as directed and finish the prescription, even if the problem appears to be solved before the medicine is gone.   Only take over-the-counter or prescription medicines for pain, discomfort, or fever as directed by your caregiver.  SEEK IMMEDIATE MEDICAL CARE IF:  You have redness, swelling, or increasing pain in the wound.   You notice pus coming from the wound.   You have a fever.   You notice a bad smell coming from the wound or dressing.  Document Released: 07/30/2000 Document Revised: 01/23/2011 Document Reviewed: 03/31/2008 [facility] Patient Information 2012 [place], [place].

## 2011-10-09 ENCOUNTER — Encounter (HOSPITAL_COMMUNITY): Payer: Self-pay | Admitting: Emergency Medicine

## 2011-10-09 ENCOUNTER — Emergency Department (HOSPITAL_COMMUNITY): Payer: 59

## 2011-10-09 ENCOUNTER — Emergency Department (HOSPITAL_COMMUNITY)
Admission: EM | Admit: 2011-10-09 | Discharge: 2011-10-09 | Disposition: A | Discharge status: Home / Self Care | Payer: 59 | Attending: Emergency Medicine | Admitting: Emergency Medicine

## 2011-10-09 DIAGNOSIS — Z8739 Personal history of other diseases of the musculoskeletal system and connective tissue: Secondary | ICD-10-CM | POA: Insufficient documentation

## 2011-10-09 DIAGNOSIS — Z96659 Presence of unspecified artificial knee joint: Secondary | ICD-10-CM | POA: Insufficient documentation

## 2011-10-09 DIAGNOSIS — M109 Gout, unspecified: Secondary | ICD-10-CM | POA: Insufficient documentation

## 2011-10-09 DIAGNOSIS — R079 Chest pain, unspecified: Secondary | ICD-10-CM | POA: Insufficient documentation

## 2011-10-09 DIAGNOSIS — Z7982 Long term (current) use of aspirin: Secondary | ICD-10-CM | POA: Insufficient documentation

## 2011-10-09 DIAGNOSIS — Z79899 Other long term (current) drug therapy: Secondary | ICD-10-CM | POA: Insufficient documentation

## 2011-10-09 DIAGNOSIS — I10 Essential (primary) hypertension: Secondary | ICD-10-CM | POA: Insufficient documentation

## 2011-10-09 DIAGNOSIS — E7800 Pure hypercholesterolemia, unspecified: Secondary | ICD-10-CM | POA: Insufficient documentation

## 2011-10-09 LAB — BASIC METABOLIC PANEL
BUN: 15 mg/dL (ref 6–23)
CO2: 25 mEq/L (ref 19–32)
Calcium: 9 mg/dL (ref 8.4–10.5)
Chloride: 103 mEq/L (ref 96–112)
Creatinine, Ser: 0.94 mg/dL (ref 0.50–1.35)
GFR calc Af Amer: >90 mL/min (ref >90)
GFR calc non Af Amer: >90 mL/min (ref >90)
Glucose, Bld: 110 mg/dL — ABNORMAL HIGH (ref 70–99)
Potassium: 3.8 mEq/L (ref 3.5–5.1)
Sodium: 137 mEq/L (ref 135–145)

## 2011-10-09 LAB — CBC
HCT: 40.4 % (ref 39.0–52.0)
Hemoglobin: 14.2 g/dL (ref 13.0–17.0)
MCH: 28.2 pg (ref 26.0–34.0)
MCHC: 35.1 g/dL (ref 30.0–36.0)
MCV: 80.3 fL (ref 78.0–100.0)
Platelets: 187 10*3/uL (ref 150–400)
RBC: 5.03 MIL/uL (ref 4.22–5.81)
RDW: 14 % (ref 11.5–15.5)
WBC: 8.3 10*3/uL (ref 4.0–10.5)

## 2011-10-09 LAB — URINALYSIS, ROUTINE W REFLEX MICROSCOPIC
Bilirubin Urine: NEGATIVE
Glucose, UA: NEGATIVE mg/dL
Hgb urine dipstick: NEGATIVE
Ketones, ur: NEGATIVE mg/dL
Leukocytes, UA: NEGATIVE
Nitrite: NEGATIVE
Protein, ur: NEGATIVE mg/dL
Specific Gravity, Urine: 1.018 (ref 1.005–1.030)
Urobilinogen, UA: 0.2 mg/dL (ref 0.0–1.0)
pH: 5.5 (ref 5.0–8.0)

## 2011-10-09 LAB — TROPONIN I: Troponin I: <0.3 ng/mL (ref <0.30)

## 2011-10-09 LAB — LIPASE, BLOOD: Lipase: 15 U/L (ref 11–59)

## 2011-10-09 MED ORDER — PANTOPRAZOLE SODIUM 20 MG PO TBEC: 20.0000 mg | DELAYED_RELEASE_TABLET | Freq: Every day | ORAL | Status: DC

## 2011-10-09 MED ORDER — SODIUM CHLORIDE 0.9 % IV BOLUS (SEPSIS)
1000.0000 mL | Freq: Once | INTRAVENOUS | Status: AC
  Administered 2011-10-09: 1000 mL via INTRAVENOUS

## 2011-10-09 MED ORDER — FAMOTIDINE IN NACL 20-0.9 MG/50ML-% IV SOLN
20.0000 mg | Freq: Once | INTRAVENOUS | Status: AC
  Administered 2011-10-09: 20 mg via INTRAVENOUS
  Filled 2011-10-09: qty 50

## 2011-10-09 MED ORDER — GI COCKTAIL ~~LOC~~
30.0000 mL | Freq: Once | ORAL | Status: AC
  Administered 2011-10-09: 30 mL via ORAL
  Filled 2011-10-09: qty 30

## 2011-10-09 NOTE — ED Notes (Signed)
Pt wife states pt has been having chest pain for a couple of days. Tonight pt woke up 0230 from the pain which started in back between the shoulder blades and moved anterior central chest. Denies lightheadedness, N/V/D. Pt a little SOB. Pain does not radiate to anything.

## 2011-10-09 NOTE — ED Notes (Signed)
Pt transported to Xray. 

## 2011-10-09 NOTE — ED Notes (Signed)
Pt back from X-ray.  

## 2011-10-09 NOTE — ED Provider Notes (Signed)
History    60yM with CP. Intermittent. Onset about 3d ago. Notices when getting up from laying down for awhile or when bending over. Improves through out day. Pain is achy to sharp. Lower sternal area and sometimes feels in back. No othe appreciable exacerbating or relieving factors. No n/v. Denies significant etoh use. Triage note reviewed, but pt denying CP. No dizziness or lightheadedness. No unusual leg pain or swelling.  CSN: [id number]  Arrival date & time 10/09/11  6962   First MD Initiated Contact with Patient 10/09/11 0701      Chief Complaint  Patient presents with  . Chest Pain    (Consider location/radiation/quality/duration/timing/severity/associated sxs/prior treatment) HPI  Past Medical History  Diagnosis Date  . Wears dentures     bottom front  . Hypertension   . Hernia   . Arthritis   . Hypercholesteremia   . Gout     Past Surgical History  Procedure Date  . Shoulder surgery     right  . Replacement total knee     partial  . Hernia repair     No family history on file.  History  Substance Use Topics  . Smoking status: Former Games developer  . Smokeless tobacco: Not on file  . Alcohol Use: No      Review of Systems   Review of symptoms negative unless otherwise noted in HPI.   Allergies  Morphine and related  Home Medications   Current Outpatient Rx  Name Route Sig Dispense Refill  . ALLOPURINOL 300 MG PO TABS Oral Take 300 mg by mouth daily as needed. For gout episode     . AMLODIPINE BESYLATE 5 MG PO TABS Oral Take 5 mg by mouth daily.      . AMOXICILLIN 500 MG PO CAPS Oral Take 500 mg by mouth daily as needed. Before going to the dentist    . ASPIRIN 81 MG PO TABS Oral Take 81 mg by mouth daily.      . CYCLOBENZAPRINE HCL 10 MG PO TABS Oral Take 10 mg by mouth 2 (two) times daily as needed. Muscle relaxer    . FLUTICASONE PROPIONATE 50 MCG/ACT NA SUSP Nasal Place 2 sprays into the nose daily.     Marland Kitchen SIMVASTATIN 40 MG PO TABS Oral Take 40 mg  by mouth at bedtime.        BP 135/92  Pulse 61  Temp 98 F (36.7 C)  Resp 19  SpO2 97%  Physical Exam  Nursing note and vitals reviewed. Constitutional: He appears well-developed and well-nourished. No distress.  HENT:  Head: Normocephalic and atraumatic.  Mouth/Throat: Oropharynx is clear and moist.  Eyes: Conjunctivae are normal. Right eye exhibits no discharge. Left eye exhibits no discharge.  Neck: Neck supple.  Cardiovascular: Normal rate, regular rhythm and normal heart sounds.  Exam reveals no gallop and no friction rub.   No murmur heard. Pulmonary/Chest: Effort normal and breath sounds normal. No respiratory distress. He exhibits no tenderness.  Abdominal: Soft. He exhibits no distension. There is tenderness. There is no rebound and no guarding.       Moderate epigastric tenderness  Genitourinary:       No cva tenderness  Musculoskeletal: He exhibits no edema and no tenderness.       Lower extremities symmetric as compared to each other. No calf tenderness. Negative Homan's. No palpable cords.   Neurological: He is alert.  Skin: Skin is warm and dry.  Psychiatric: He has a normal mood and  affect. His behavior is normal. Thought content normal.    ED Course  Procedures (including critical care time)  Labs Reviewed  BASIC METABOLIC PANEL - Abnormal; Notable for the following:    Glucose, Bld 110 (*)     All other components within normal limits  CBC  TROPONIN I  URINALYSIS, ROUTINE W REFLEX MICROSCOPIC  LIPASE, BLOOD  LAB REPORT - SCANNED   Dg Chest 2 View  10/09/2011  *RADIOLOGY REPORT*  Clinical Data: Mid chest pain.  High blood pressure.  Nonsmoker.  CHEST - 2 VIEW  Comparison: 01/06/2011  Findings: Shallow inspiration with elevation of the left hemidiaphragm.  Heart size is obscured by the hemidiaphragms but appears prominent.  Vascular crowding without significant vascular congestion.  Linear atelectasis in the lung bases.  Tortuous and calcified aorta.  No  focal consolidation.  No pneumothorax. Similar appearance in comparison to previous study.  IMPRESSION: Shallow inspiration with elevation of the left hemidiaphragm and linear atelectasis in the lung bases.   Original Report Authenticated By: Marlon Pel, M.D.    EKG:  Rhythm: sinus brady Vent. rate 58 BPM PR interval 204 ms QRS duration 96 ms QT/QTc 436/428 ms Intervals/conduction: normal ST segments: NS ST changes. t wave flattening inferiorly and laterally.   1. Chest pain       MDM  60yM with CP. Suspect GI etiology given reproducibility with palpation in epigastrium. Consider ACS but doubt. EKG with no ischemic changes. Consider dissection with radiation to back but doubt. Doubt infectious. Plan PPI and close outpt fu. Return precautions discussed.        Raeford Razor, MD 10/12/11 [phone]

## 2011-10-09 NOTE — ED Notes (Signed)
Pt alert, arrives from home, c/o mid sternal sharp chest pain, onset several days ago, denies n/v, states "a little SOB", resp even unlabored, skin pwd, pain changes with movement

## 2011-10-09 NOTE — ED Notes (Signed)
Tia, PA informed pt has not been seen.

## 2011-12-08 ENCOUNTER — Emergency Department (INDEPENDENT_AMBULATORY_CARE_PROVIDER_SITE_OTHER)
Admission: EM | Admit: 2011-12-08 | Discharge: 2011-12-08 | Disposition: A | Discharge status: Home / Self Care | Payer: BC Managed Care – PPO | Source: Home / Self Care | Attending: Emergency Medicine | Admitting: Emergency Medicine

## 2011-12-08 ENCOUNTER — Encounter (HOSPITAL_COMMUNITY): Payer: Self-pay

## 2011-12-08 DIAGNOSIS — L42 Pityriasis rosea: Secondary | ICD-10-CM

## 2011-12-08 DIAGNOSIS — I10 Essential (primary) hypertension: Secondary | ICD-10-CM

## 2011-12-08 MED ORDER — TRIAMCINOLONE ACETONIDE 0.1 % EX CREA: TOPICAL_CREAM | Freq: Two times a day (BID) | CUTANEOUS | Status: DC

## 2011-12-08 NOTE — ED Notes (Signed)
Patient complains of a rash all over, started one week ago

## 2011-12-08 NOTE — ED Provider Notes (Signed)
History     CSN: [id number]  Arrival date & time 12/08/11  4098   First MD Initiated Contact with Patient 12/08/11 [phone]      Chief Complaint  Patient presents with  . Rash    (Consider location/radiation/quality/duration/timing/severity/associated sxs/prior treatment) HPI Comments: Patient presents urgent care describing for about a week he's been noticing a rash that has gotten progressively worse as he has developed new lesions on his upper torso and upper extremities. Describes minimal itchiness, denies any further symptoms when asked such as fevers, myalgias, arthralgias, changes in appetite or an intentional weight loss. He denies any history of having been working outdoors recently. Describes as some of the rashes have some type of "darkening aspect in the middle".  Patient is a 61 y.o. male presenting with rash. The history is provided by the patient.  Rash  This is a new problem. The current episode started more than 1 week ago. The problem has not changed since onset.The problem is associated with nothing. There has been no fever. The rash is present on the torso, right arm and left arm. He has tried nothing for the symptoms. The treatment provided no relief.    Past Medical History  Diagnosis Date  . Wears dentures     bottom front  . Hypertension   . Hernia   . Arthritis   . Hypercholesteremia   . Gout     Past Surgical History  Procedure Date  . Shoulder surgery     right  . Replacement total knee     partial  . Hernia repair     No family history on file.  History  Substance Use Topics  . Smoking status: Former Games developer  . Smokeless tobacco: Not on file  . Alcohol Use: No      Review of Systems  Constitutional: Negative for fever, chills, appetite change, fatigue and unexpected weight change.  HENT: Negative for drooling, mouth sores, trouble swallowing, neck stiffness, voice change, postnasal drip and sinus pressure.   Respiratory: Negative for  shortness of breath.   Musculoskeletal: Negative for arthralgias.  Skin: Positive for rash. Negative for color change and wound.    Allergies  Morphine and related  Home Medications   Current Outpatient Rx  Name Route Sig Dispense Refill  . ALLOPURINOL 300 MG PO TABS Oral Take 300 mg by mouth daily as needed. For gout episode     . AMLODIPINE BESYLATE 5 MG PO TABS Oral Take 5 mg by mouth daily.      . ASPIRIN 81 MG PO TABS Oral Take 81 mg by mouth daily.      . CYCLOBENZAPRINE HCL 10 MG PO TABS Oral Take 10 mg by mouth 2 (two) times daily as needed. Muscle relaxer    . FLUTICASONE PROPIONATE 50 MCG/ACT NA SUSP Nasal Place 2 sprays into the nose daily.     Marland Kitchen PANTOPRAZOLE SODIUM 20 MG PO TBEC Oral Take 1 tablet (20 mg total) by mouth daily. 30 tablet 0  . SIMVASTATIN 40 MG PO TABS Oral Take 40 mg by mouth at bedtime.      . TRIAMCINOLONE ACETONIDE 0.1 % EX CREA Topical Apply topically 2 (two) times daily. Apply bid affected areas for 2 weeks 30 g 0    BP 153/102  Pulse 88  Temp 98.6 F (37 C) (Oral)  Resp 20  SpO2 96%  Physical Exam  Nursing note and vitals reviewed. Constitutional: Vital signs are normal. He appears well-developed and well-nourished.  Non-toxic appearance. He does not have a sickly appearance. He does not appear ill. No distress.  Neurological: He is alert.  Skin: Rash noted. Rash is macular and maculopapular. No erythema. No pallor.       ED Course  Procedures (including critical care time)  Labs Reviewed - No data to display No results found.   1. Pityriasis rosea   2. Hypertension       MDM  Rash consistent with PDS process. Patient noted to be hypertensive. Describe that his doctors modified recently changes blood pressure medicine. We discussed tomorrow monitoring and followup with his primary care Dr. for blood pressure medicine adjustment if necessary. Patient will provide a blood pressure diary. Denies any symptoms besides mild pruritus  associated with this new onset rash. Was prescribed a triamcinolone cream to be used for pruritus if needed      Jimmie Molly, MD 12/08/11 1027

## 2012-04-04 ENCOUNTER — Emergency Department (INDEPENDENT_AMBULATORY_CARE_PROVIDER_SITE_OTHER): Payer: BC Managed Care – PPO

## 2012-04-04 ENCOUNTER — Emergency Department (HOSPITAL_COMMUNITY)
Admission: EM | Admit: 2012-04-04 | Discharge: 2012-04-04 | Disposition: A | Discharge status: Home / Self Care | Payer: BC Managed Care – PPO | Source: Home / Self Care

## 2012-04-04 ENCOUNTER — Encounter (HOSPITAL_COMMUNITY): Payer: Self-pay | Admitting: Emergency Medicine

## 2012-04-04 DIAGNOSIS — M25519 Pain in unspecified shoulder: Secondary | ICD-10-CM

## 2012-04-04 DIAGNOSIS — M25512 Pain in left shoulder: Secondary | ICD-10-CM

## 2012-04-04 MED ORDER — OXYCODONE-ACETAMINOPHEN 5-325 MG PO TABS: 1.0000 | ORAL_TABLET | Freq: Four times a day (QID) | ORAL | Status: DC | PRN

## 2012-04-04 NOTE — ED Notes (Signed)
Pt states that he slipped on ice this a.m around 8:30/9 a.m falling into car hitting left side. Pt c/o left shoulder pain. Pt states "heard tearing noise".

## 2012-04-04 NOTE — ED Provider Notes (Signed)
Medical screening examination/treatment/procedure(s) were performed by a resident physician and as supervising physician I was immediately available for consultation/collaboration.  Leslee Home, M.D.  Reuben Likes, MD 04/04/12 [phone]

## 2012-04-04 NOTE — ED Notes (Signed)
Waiting discharge papers 

## 2012-04-04 NOTE — ED Provider Notes (Signed)
Marvin [name] is a 62 y.o. male who presents to Urgent Care today for left shoulder pain starting this morning. Patient was getting out of the car slipped and fell landing with his lateral aspect of his upper arm.  He noted immediate left shoulder pain with inability to abduct or forward flex his arm.  He denies any radiating pain weakness or numbness fevers or chills. Has a pertinent orthopedic history for right rotator shoulder cuff repair by Dr. Lequita Halt in 2012.    PMH reviewed. Hypertension, hypercholesterolemia, gout.  History  Substance Use Topics  . Smoking status: Former Games developer  . Smokeless tobacco: Not on file  . Alcohol Use: No   ROS as above Medications reviewed. No current facility-administered medications for this encounter.   Current Outpatient Prescriptions  Medication Sig Dispense Refill  . amLODipine (NORVASC) 5 MG tablet Take 5 mg by mouth daily.        Marland Kitchen aspirin 81 MG tablet Take 81 mg by mouth daily.        . pantoprazole (PROTONIX) 20 MG tablet Take 1 tablet (20 mg total) by mouth daily.  30 tablet  0  . simvastatin (ZOCOR) 40 MG tablet Take 40 mg by mouth at bedtime.        Marland Kitchen allopurinol (ZYLOPRIM) 300 MG tablet Take 300 mg by mouth daily as needed. For gout episode       . cyclobenzaprine (FLEXERIL) 10 MG tablet Take 10 mg by mouth 2 (two) times daily as needed. Muscle relaxer      . fluticasone (FLONASE) 50 MCG/ACT nasal spray Place 2 sprays into the nose daily.       Marland Kitchen oxyCODONE-acetaminophen (ROXICET) 5-325 MG per tablet Take 1-2 tablets by mouth every 6 (six) hours as needed for pain.  15 tablet  0  . triamcinolone cream (KENALOG) 0.1 % Apply topically 2 (two) times daily. Apply bid affected areas for 2 weeks  30 g  0    Exam:  BP 159/101  Pulse 80  Temp(Src) 97.6 F (36.4 C) (Oral)  Resp 18  SpO2 100% Gen: Well NAD Lt shoulder : Normal-appearing tender over the bicipital groove Active abduction limited to about 70 active forward flexion limited to  about 70. Passive abduction 150 with a positive drop arm sign.  Negative Yergason's and speeds tests Grip strength is intact distally as are pulses capillary refill and sensation.   No results found for this or any previous visit (from the past 24 hour(s)). Dg Shoulder Left  04/04/2012  *RADIOLOGY REPORT*  Clinical Data: Left shoulder pain following fall.  LEFT SHOULDER - 2+ VIEW  Comparison: None  Findings: There is no evidence of fracture, subluxation or dislocation. No focal bony lesions are identified. Minimal degenerative changes of the [facility] joint noted. The visualized left bony thorax is unremarkable.  IMPRESSION: No evidence of acute bony abnormality.   Original Report Authenticated By: Harmon Pier, M.D.     Assessment and Plan: 62 y.o. male with acute left shoulder injury suspicious for rotator cuff tear.  Plan: Sling for comfort, Percocet for comfort, followup with [facility] orthopedics in one or 2 days for further evaluation and management.  Discuss my suspicion for acute rotator cuff tear. Patient expresses understanding and agreement.     Rodolph Bong, MD 04/04/12 [phone]

## 2012-07-12 ENCOUNTER — Emergency Department (HOSPITAL_COMMUNITY): Payer: BC Managed Care – PPO

## 2012-07-12 ENCOUNTER — Encounter (HOSPITAL_COMMUNITY): Payer: Self-pay | Admitting: Emergency Medicine

## 2012-07-12 ENCOUNTER — Emergency Department (HOSPITAL_COMMUNITY)
Admission: EM | Admit: 2012-07-12 | Discharge: 2012-07-12 | Disposition: A | Discharge status: Home / Self Care | Payer: BC Managed Care – PPO | Attending: Emergency Medicine | Admitting: Emergency Medicine

## 2012-07-12 DIAGNOSIS — Z7982 Long term (current) use of aspirin: Secondary | ICD-10-CM | POA: Insufficient documentation

## 2012-07-12 DIAGNOSIS — R071 Chest pain on breathing: Secondary | ICD-10-CM | POA: Insufficient documentation

## 2012-07-12 DIAGNOSIS — I10 Essential (primary) hypertension: Secondary | ICD-10-CM | POA: Insufficient documentation

## 2012-07-12 DIAGNOSIS — R0789 Other chest pain: Secondary | ICD-10-CM

## 2012-07-12 DIAGNOSIS — Z8739 Personal history of other diseases of the musculoskeletal system and connective tissue: Secondary | ICD-10-CM | POA: Insufficient documentation

## 2012-07-12 DIAGNOSIS — Z79899 Other long term (current) drug therapy: Secondary | ICD-10-CM | POA: Insufficient documentation

## 2012-07-12 DIAGNOSIS — Z8639 Personal history of other endocrine, nutritional and metabolic disease: Secondary | ICD-10-CM | POA: Insufficient documentation

## 2012-07-12 DIAGNOSIS — Z87891 Personal history of nicotine dependence: Secondary | ICD-10-CM | POA: Insufficient documentation

## 2012-07-12 DIAGNOSIS — Z87898 Personal history of other specified conditions: Secondary | ICD-10-CM | POA: Insufficient documentation

## 2012-07-12 DIAGNOSIS — Z862 Personal history of diseases of the blood and blood-forming organs and certain disorders involving the immune mechanism: Secondary | ICD-10-CM | POA: Insufficient documentation

## 2012-07-12 DIAGNOSIS — E7800 Pure hypercholesterolemia, unspecified: Secondary | ICD-10-CM | POA: Insufficient documentation

## 2012-07-12 DIAGNOSIS — M542 Cervicalgia: Secondary | ICD-10-CM | POA: Insufficient documentation

## 2012-07-12 LAB — CBC
HCT: 40.8 % (ref 39.0–52.0)
Hemoglobin: 14.1 g/dL (ref 13.0–17.0)
MCH: 27.6 pg (ref 26.0–34.0)
MCHC: 34.6 g/dL (ref 30.0–36.0)
MCV: 79.8 fL (ref 78.0–100.0)
Platelets: 204 10*3/uL (ref 150–400)
RBC: 5.11 MIL/uL (ref 4.22–5.81)
RDW: 14.3 % (ref 11.5–15.5)
WBC: 10.2 10*3/uL (ref 4.0–10.5)

## 2012-07-12 LAB — POCT I-STAT, CHEM 8
BUN: 11 mg/dL (ref 6–23)
Calcium, Ion: 1.23 mmol/L (ref 1.12–1.23)
Chloride: 106 mEq/L (ref 96–112)
Creatinine, Ser: 1 mg/dL (ref 0.50–1.35)
Glucose, Bld: 104 mg/dL — ABNORMAL HIGH (ref 70–99)
HCT: 45 % (ref 39.0–52.0)
Hemoglobin: 15.3 g/dL (ref 13.0–17.0)
Potassium: 4.1 mEq/L (ref 3.5–5.1)
Sodium: 140 mEq/L (ref 135–145)
TCO2: 28 mmol/L (ref 0–100)

## 2012-07-12 LAB — POCT I-STAT TROPONIN I: Troponin i, poc: 0.01 ng/mL (ref 0.00–0.08)

## 2012-07-12 LAB — D-DIMER, QUANTITATIVE (NOT AT ARMC): D-Dimer, Quant: 0.7 ug/mL-FEU — ABNORMAL HIGH (ref 0.00–0.48)

## 2012-07-12 MED ORDER — IOHEXOL 350 MG/ML SOLN
100.0000 mL | Freq: Once | INTRAVENOUS | Status: AC | PRN
  Administered 2012-07-12: 100 mL via INTRAVENOUS

## 2012-07-12 MED ORDER — OXYCODONE-ACETAMINOPHEN 5-325 MG PO TABS: 2.0000 | ORAL_TABLET | Freq: Four times a day (QID) | ORAL | Status: DC | PRN

## 2012-07-12 MED ORDER — OXYCODONE-ACETAMINOPHEN 5-325 MG PO TABS
2.0000 | ORAL_TABLET | Freq: Once | ORAL | Status: AC
  Administered 2012-07-12: 2 via ORAL
  Filled 2012-07-12: qty 2

## 2012-07-12 NOTE — ED Notes (Signed)
Pt alert and oriented x4. Respirations even and unlabored. Bilateral rise and fall of chest. Skin warm and dry. In no acute distress. Denies needs.  

## 2012-07-12 NOTE — ED Provider Notes (Signed)
History     CSN: [id number]  Arrival date & time 07/12/12  1021   First MD Initiated Contact with Patient 07/12/12 1052      Chief Complaint  Patient presents with  . Chest Pain  . Neck Pain    (Consider location/radiation/quality/duration/timing/severity/associated sxs/prior treatment) HPI Complains of left anterior chest pain onset gradually 1 PM on 07/10/2012. Pain is worse with yawning changing positions or moving his neck. Pain radiates to his posterior neck. He states he has to rotate his entire body when he wants to look to the side of the neck pain. He denies fever denies cough denies shortness of breath. No treatment prior to coming here. Pain is nonexertional Past Medical History  Diagnosis Date  . Wears dentures     bottom front  . Hypertension   . Hernia   . Arthritis   . Hypercholesteremia   . Gout     Past Surgical History  Procedure Laterality Date  . Shoulder surgery      right  . Replacement total knee      partial  . Hernia repair      No family history on file.  History  Substance Use Topics  . Smoking status: Former Games developer  . Smokeless tobacco: Not on file  . Alcohol Use: No      Review of Systems  Constitutional: Negative.   HENT: Positive for neck pain.   Respiratory: Negative.   Cardiovascular: Positive for chest pain.  Gastrointestinal: Negative.   Skin: Negative.   Neurological: Negative.   Psychiatric/Behavioral: Negative.   All other systems reviewed and are negative.    Allergies  Morphine and related  Home Medications   Current Outpatient Rx  Name  Route  Sig  Dispense  Refill  . allopurinol (ZYLOPRIM) 300 MG tablet   Oral   Take 300 mg by mouth daily as needed. For gout episode          . amLODipine (NORVASC) 5 MG tablet   Oral   Take 5 mg by mouth daily.           Marland Kitchen aspirin 81 MG tablet   Oral   Take 81 mg by mouth daily.           . cyclobenzaprine (FLEXERIL) 10 MG tablet   Oral   Take 10 mg by  mouth 2 (two) times daily as needed. Muscle relaxer         . fluticasone (FLONASE) 50 MCG/ACT nasal spray   Nasal   Place 2 sprays into the nose daily.          Marland Kitchen oxyCODONE-acetaminophen (ROXICET) 5-325 MG per tablet   Oral   Take 1-2 tablets by mouth every 6 (six) hours as needed for pain.   15 tablet   0   . pantoprazole (PROTONIX) 20 MG tablet   Oral   Take 1 tablet (20 mg total) by mouth daily.   30 tablet   0   . simvastatin (ZOCOR) 40 MG tablet   Oral   Take 40 mg by mouth at bedtime.           . triamcinolone cream (KENALOG) 0.1 %   Topical   Apply topically 2 (two) times daily. Apply bid affected areas for 2 weeks   30 g   0     BP 147/92  Pulse 72  Temp(Src) 97.9 F (36.6 C) (Oral)  Resp 19  SpO2 95%  Physical Exam  Nursing note  and vitals reviewed. Constitutional: He appears well-developed and well-nourished.  HENT:  Head: Normocephalic and atraumatic.  Eyes: Conjunctivae are normal. Pupils are equal, round, and reactive to light.  Neck: Neck supple. No tracheal deviation present. No thyromegaly present.  Cardiovascular: Normal rate and regular rhythm.   No murmur heard. Pulmonary/Chest: Effort normal and breath sounds normal.  Chest wall is exquisitely tender reproducing pain exactly pain is also reproduced when he sits up in bed from a supine position  Abdominal: Soft. Bowel sounds are normal. He exhibits no distension. There is no tenderness.  Musculoskeletal: Normal range of motion. He exhibits no edema and no tenderness.  Neurological: He is alert. Coordination normal.  Skin: Skin is warm and dry. No rash noted.  Psychiatric: He has a normal mood and affect.    ED Course  Procedures (including critical care time)  Labs Reviewed  CBC   No results found.  Date: 07/12/2012  Rate: 70  Rhythm: normal sinus rhythm  QRS Axis: normal  Intervals: normal  ST/T Wave abnormalities: nonspecific T wave changes  Conduction Disutrbances:none   Narrative Interpretation:   Old EKG Reviewed: Unchanged from 10/09/2011 interpreted by me  Results for orders placed during the hospital encounter of 07/12/12  CBC      Result Value Range   WBC 10.2  4.0 - 10.5 K/uL   RBC 5.11  4.22 - 5.81 MIL/uL   Hemoglobin 14.1  13.0 - 17.0 g/dL   HCT 81.1  91.4 - 78.2 %   MCV 79.8  78.0 - 100.0 fL   MCH 27.6  26.0 - 34.0 pg   MCHC 34.6  30.0 - 36.0 g/dL   RDW 95.6  21.3 - 08.6 %   Platelets 204  150 - 400 K/uL  D-DIMER, QUANTITATIVE      Result Value Range   D-Dimer, Quant 0.70 (*) 0.00 - 0.48 ug/mL-FEU  POCT I-STAT, CHEM 8      Result Value Range   Sodium 140  135 - 145 mEq/L   Potassium 4.1  3.5 - 5.1 mEq/L   Chloride 106  96 - 112 mEq/L   BUN 11  6 - 23 mg/dL   Creatinine, Ser 5.78  0.50 - 1.35 mg/dL   Glucose, Bld 469 (*) 70 - 99 mg/dL   Calcium, Ion 6.29  5.28 - 1.23 mmol/L   TCO2 28  0 - 100 mmol/L   Hemoglobin 15.3  13.0 - 17.0 g/dL   HCT 41.3  24.4 - 01.0 %  POCT I-STAT TROPONIN I      Result Value Range   Troponin i, poc 0.01  0.00 - 0.08 ng/mL   Comment 3            Dg Chest 2 View  07/12/2012   *RADIOLOGY REPORT*  Clinical Data: Cough.  Left-sided chest pain.  Current history of hypertension.  Former smoker.  CHEST - 2 VIEW  Comparison: Two-view chest x-ray 10/09/2011, 01/06/2011, 08/15/2010.  CTA chest 01/06/2011.  Findings: Cardiac silhouette mildly enlarged but stable.  Thoracic aorta tortuous, unchanged.  Hilar and mediastinal contours otherwise unremarkable.  Stable chronic elevation of the left hemidiaphragm and chronic scar/atelectasis in the left lower lobe and lingula.  New mild atelectasis in the right lower lobe. Pulmonary vascularity normal without evidence pulmonary edema. Lungs otherwise clear.  No visible pleural effusions.  Mild degenerative changes involving the thoracic spine.  IMPRESSION: Mild atelectasis in the right lower lobe.  No acute cardiopulmonary disease otherwise.  Stable chronic  elevation of the left  hemidiaphragm and chronic scar/atelectasis in the left lower lobe and lingula.  Stable mild cardiomegaly without pulmonary edema.   Original Report Authenticated By: Hulan Saas, M.D.   Ct Angio Chest Pe W/cm &/or Wo Cm  07/12/2012   *RADIOLOGY REPORT*  Clinical Data: Left pleuritic chest pain radiating to the back for 2 days.  History of left shoulder surgery 1 month ago.  Question of pulmonary embolus.  Hypertension.  CT ANGIOGRAPHY CHEST  Technique:  Multidetector CT imaging of the chest using the standard protocol during bolus administration of intravenous contrast. Multiplanar reconstructed images including MIPs were obtained and reviewed to evaluate the vascular anatomy.  Contrast: OMNIPAQUE IOHEXOL 350 MG/ML SOLN  Comparison: 01/06/2011  Findings: Pulmonary arteries are well opacified.  There is no evidence for acute pulmonary embolus.  There is elevation of the left hemidiaphragm, stable in appearance.  The heart is mildly enlarged.  There is minimal pulmonary edema.  Trace bilateral pleural effusions are present.  No focal consolidations are identified.  No pulmonary nodules. No mediastinal, hilar, or axillary adenopathy.  The visualized portion of the thyroid gland has a normal appearance.  IMPRESSION:  1.  Technically adequate exam showing no pulmonary embolus. 2.  Mild changes of pulmonary edema. 3.  Stable elevation of the left hemidiaphragm.   Original Report Authenticated By: Norva Pavlov, M.D.   Chest xray viewed by me No diagnosis found.  3:50 PM pain improved after treatment with Percocet.  MDM  Side doubt acute coronary syndrome highly atypical symptoms. Pain is easily reproducible on exam with palpation or changing position. Doubt pulmonary edema patient denies shortness of breath is able to lie flat without dyspnea has no neck vein distention, normal respiratory call socks and lung exam. Exam and history most consistent with musculoskeletal chest pain Plan prescription  Percocet Followup with Dr. Renae Gloss as needed in 3 days Diagnosis#1 musculoskeletal chest pain #2 neck pain        Doug Sou, MD 07/12/12 1605

## 2012-07-12 NOTE — ED Notes (Signed)
Pt escorted to discharge window. Verbalized understanding discharge instructions. In no acute distress. Vitals reviewed and WDL.  

## 2012-07-12 NOTE — ED Notes (Signed)
Patient transported to CT 

## 2012-07-12 NOTE — ED Notes (Signed)
Pt states that he has been having left chest pain radiating to his neck x 2 days.  Had surgery on his left shoulder about a month ago.  Denies NVD.  Denies heart problems.

## 2012-11-06 IMAGING — CR DG CHEST 2V
2 series · 2 of 2 positions shown · non-contrast
Comparison: 08/15/2010

CLINICAL DATA: Sudden onset of chest pain.

CHEST - 2 VIEW

[w chest pa]
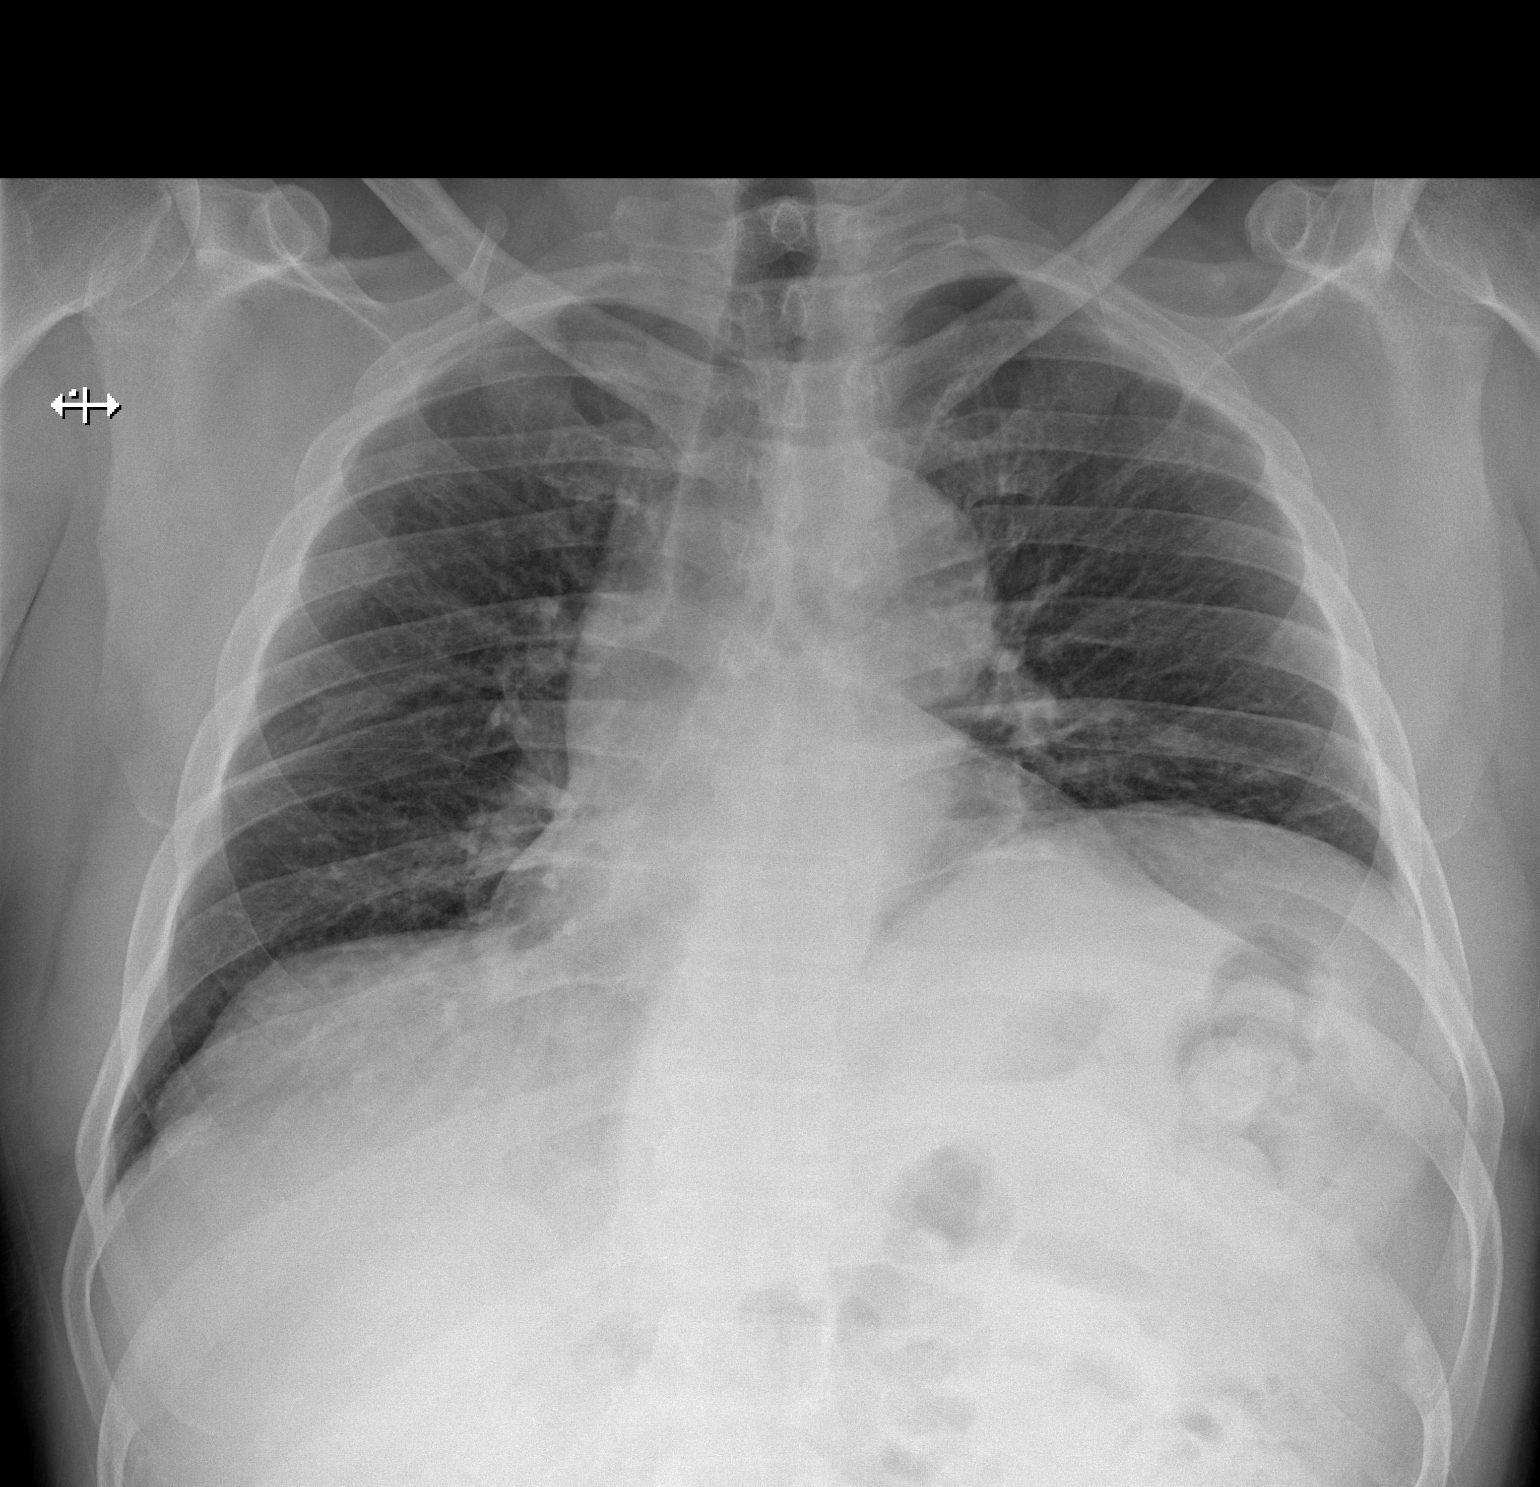

[w chest lat]
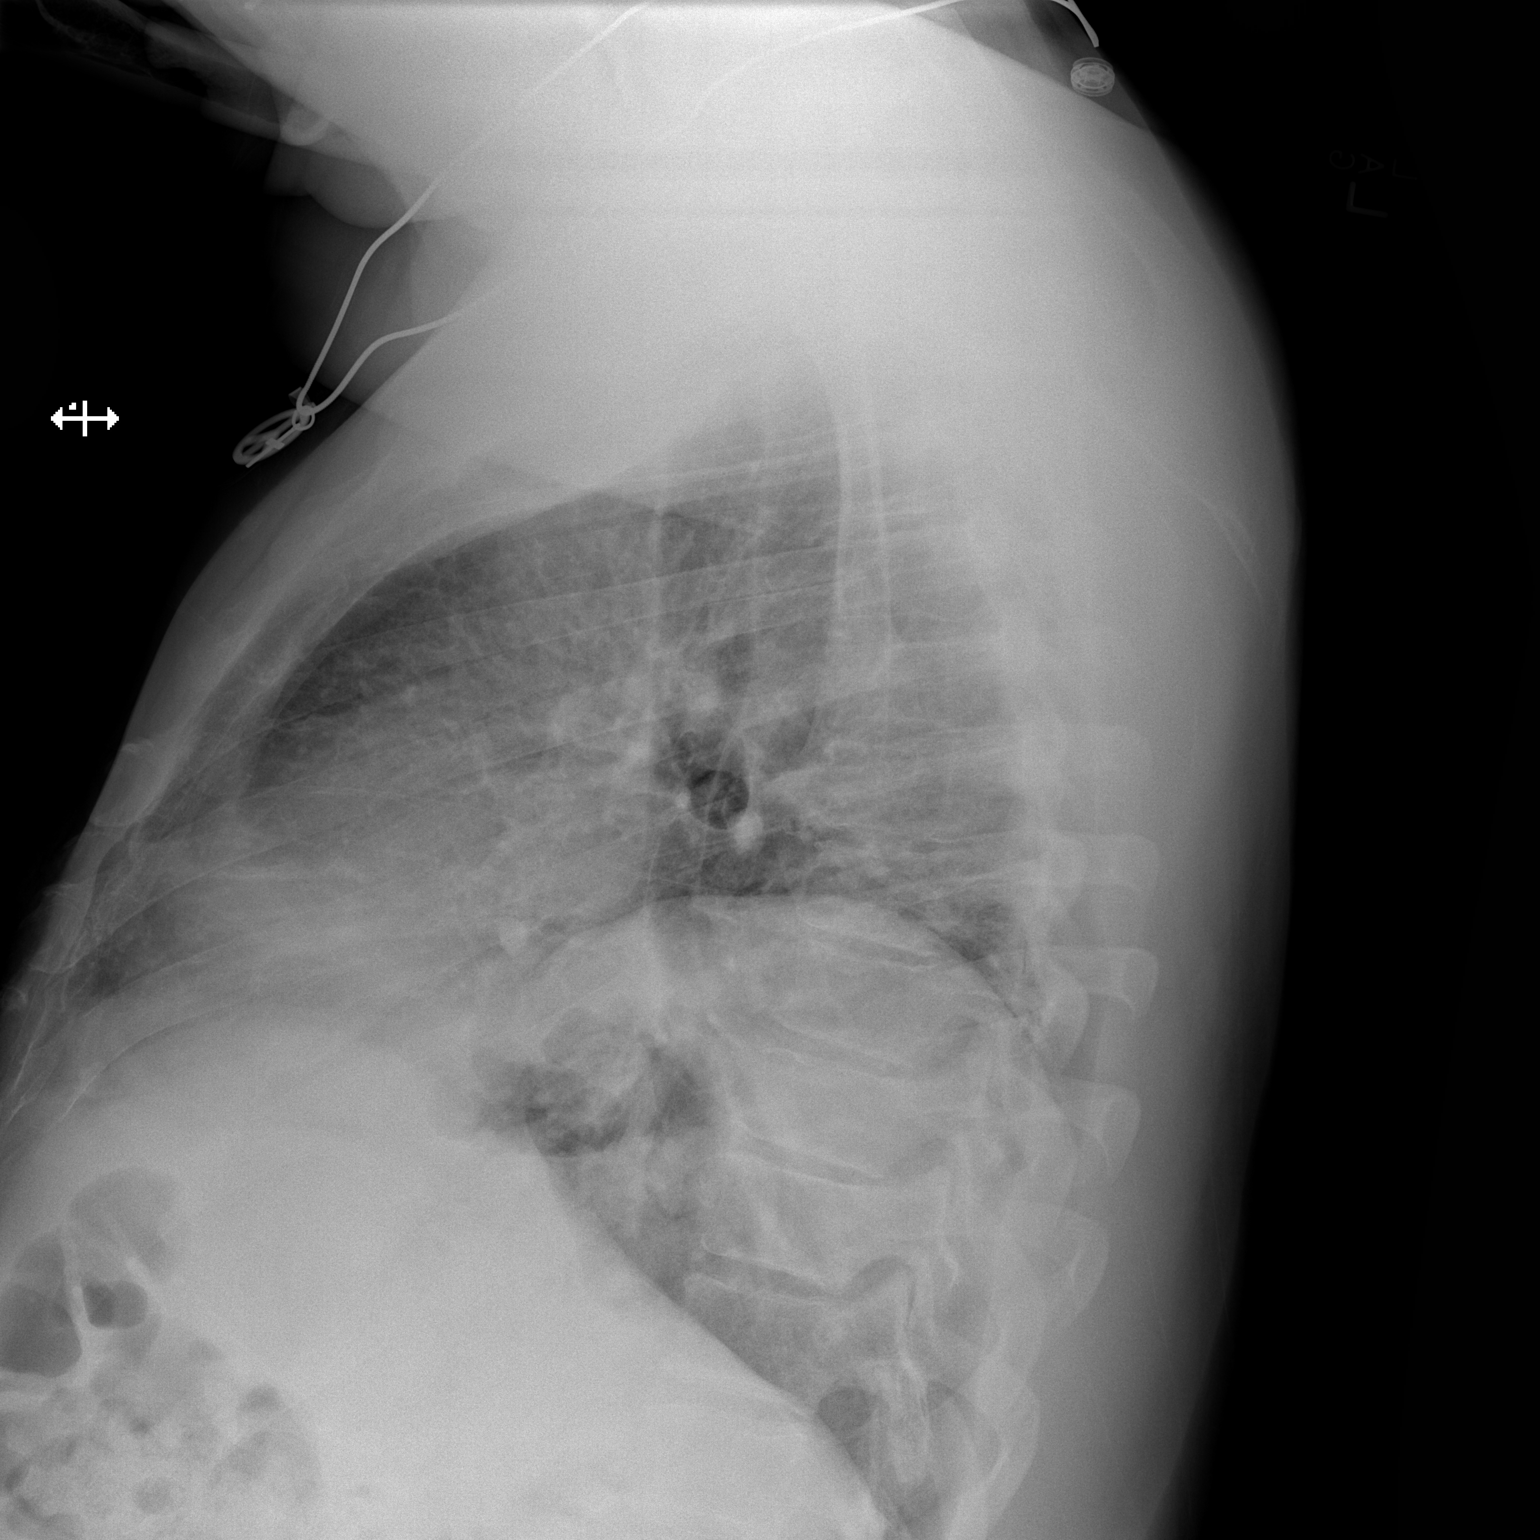

[2 of 2 positions shown; findings below may reference images not displayed]

FINDINGS: No significant change from prior.  Hypoaeration results
in interstitial crowding.  Mildly tortuous thoracic aorta.  The
cardiac contour upper normal limits to mildly enlarged.  Elevated
left hemidiaphragm.  No pleural effusion or pneumothorax.  Plate
like opacity within the right mid lung may reflect atelectasis.  No
acute osseous abnormality.
IMPRESSION: Hypoaeration results in interstitial crowding and mild atelectasis.

Heart size upper normal limits to mildly enlarged.

## 2013-01-06 ENCOUNTER — Emergency Department (HOSPITAL_BASED_OUTPATIENT_CLINIC_OR_DEPARTMENT_OTHER)
Admission: EM | Admit: 2013-01-06 | Discharge: 2013-01-06 | Disposition: A | Discharge status: Home / Self Care | Payer: BC Managed Care – PPO | Attending: Emergency Medicine | Admitting: Emergency Medicine

## 2013-01-06 ENCOUNTER — Encounter (HOSPITAL_BASED_OUTPATIENT_CLINIC_OR_DEPARTMENT_OTHER): Payer: Self-pay | Admitting: Emergency Medicine

## 2013-01-06 DIAGNOSIS — E7800 Pure hypercholesterolemia, unspecified: Secondary | ICD-10-CM | POA: Insufficient documentation

## 2013-01-06 DIAGNOSIS — M109 Gout, unspecified: Secondary | ICD-10-CM

## 2013-01-06 DIAGNOSIS — M129 Arthropathy, unspecified: Secondary | ICD-10-CM | POA: Insufficient documentation

## 2013-01-06 DIAGNOSIS — IMO0002 Reserved for concepts with insufficient information to code with codable children: Secondary | ICD-10-CM | POA: Insufficient documentation

## 2013-01-06 DIAGNOSIS — Z87891 Personal history of nicotine dependence: Secondary | ICD-10-CM | POA: Insufficient documentation

## 2013-01-06 DIAGNOSIS — Z7982 Long term (current) use of aspirin: Secondary | ICD-10-CM | POA: Insufficient documentation

## 2013-01-06 DIAGNOSIS — Z79899 Other long term (current) drug therapy: Secondary | ICD-10-CM | POA: Insufficient documentation

## 2013-01-06 DIAGNOSIS — I10 Essential (primary) hypertension: Secondary | ICD-10-CM | POA: Insufficient documentation

## 2013-01-06 MED ORDER — COLCHICINE 0.6 MG PO TABS: 0.6000 mg | ORAL_TABLET | Freq: Every day | ORAL | Status: DC

## 2013-01-06 MED ORDER — PREDNISONE 50 MG PO TABS: ORAL_TABLET | ORAL | Status: DC

## 2013-01-06 MED ORDER — TRIAMCINOLONE ACETONIDE 40 MG/ML IJ SUSP
60.0000 mg | Freq: Once | INTRAMUSCULAR | Status: AC
  Administered 2013-01-06: 60 mg via INTRAMUSCULAR
  Filled 2013-01-06: qty 5

## 2013-01-06 MED ORDER — IBUPROFEN 800 MG PO TABS: 800.0000 mg | ORAL_TABLET | Freq: Three times a day (TID) | ORAL | Status: DC | PRN

## 2013-01-06 NOTE — ED Provider Notes (Signed)
CSN: [id number]     Arrival date & time 01/06/13  3086 History   First MD Initiated Contact with Patient 01/06/13 0945     Chief Complaint  Patient presents with  . Gout   (Consider location/radiation/quality/duration/timing/severity/associated sxs/prior Treatment) Patient is a 62 y.o. male presenting with ankle pain.  Ankle Pain Location:  Ankle Time since incident:  2 days Injury: no   Ankle location:  L ankle Pain details:    Quality:  Aching, throbbing and pressure   Radiates to:  Does not radiate   Severity:  Severe   Onset quality:  Gradual   Duration:  2 days   Timing:  Constant   Progression:  Worsening Chronicity:  Recurrent Dislocation: no   Foreign body present:  No foreign bodies Prior injury to area:  No Relieved by:  Nothing Worsened by:  Adduction, abduction, bearing weight, exercise, rotation, flexion, extension and activity Ineffective treatments:  NSAIDs, ice and rest (allopurinol) Associated symptoms: decreased ROM   Associated symptoms: no back pain, no fatigue, no fever, no itching, no muscle weakness, no neck pain, no numbness, no stiffness, no swelling and no tingling    Patient has history of gout. 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 After this episode he restarted his allopurinol.  Past Medical History  Diagnosis Date  . Wears dentures     bottom front  . Hypertension   . Hernia   . Arthritis   . Hypercholesteremia   . Gout    Past Surgical History  Procedure Laterality Date  . Shoulder surgery      right  . Replacement total knee      partial  . Hernia repair     History reviewed. No pertinent family history. History  Substance Use Topics  . Smoking status: Former Games developer  . Smokeless tobacco: Not on file  . Alcohol Use: No    Review of Systems  Constitutional: Negative for fever and fatigue.  Musculoskeletal: Negative for back pain, neck pain and stiffness.  Skin: Negative for itching.     Allergies  Morphine and related  Home Medications   Current Outpatient Rx  Name  Route  Sig  Dispense  Refill  . allopurinol (ZYLOPRIM) 300 MG tablet   Oral   Take 300 mg by mouth daily as needed. For gout episode          . amLODipine (NORVASC) 5 MG tablet   Oral   Take 5 mg by mouth every morning.          Marland Kitchen aspirin EC 81 MG tablet   Oral   Take 81 mg by mouth every morning.         . fluticasone (FLONASE) 50 MCG/ACT nasal spray   Nasal   Place 2 sprays into the nose daily as needed for allergies.          Marland Kitchen HYDROcodone-acetaminophen (NORCO/VICODIN) 5-325 MG per tablet   Oral   Take 1 tablet by mouth every 4 (four) hours as needed for pain.         . methocarbamol (ROBAXIN) 500 MG tablet   Oral   Take 500 mg by mouth every 6 (six) hours as needed (FOR MUSCLE PAIN).         Marland Kitchen oxyCODONE-acetaminophen (PERCOCET/ROXICET) 5-325 MG per tablet   Oral   Take 2 tablets by mouth every 6 (six) hours as needed for pain.   10 tablet  0   . pantoprazole (PROTONIX) 40 MG tablet   Oral   Take 40 mg by mouth 2 (two) times daily.         . simvastatin (ZOCOR) 40 MG tablet   Oral   Take 40 mg by mouth every other day.           BP 144/75  Pulse 74  Temp(Src) 98.7 F (37.1 C) (Oral)  Resp 20  Ht 6' (1.829 m)  Wt 246 lb (111.585 kg)  BMI 33.36 kg/m2  SpO2 98% Physical Exam  Constitutional: He is oriented to person, place, and time. He appears well-developed and well-nourished. No distress.  HENT:  Head: Normocephalic.  Mouth/Throat: Oropharynx is clear and moist. No oropharyngeal exudate.  Eyes: EOM are normal. Pupils are equal, round, and reactive to light.  Cardiovascular: Normal rate, regular rhythm, normal heart sounds and intact distal pulses.  Exam reveals no gallop and no friction rub.   No murmur heard. Pulmonary/Chest: Effort normal and breath sounds normal. No respiratory distress. He has no wheezes. He has no rales.  Abdominal: Soft.  Bowel sounds are normal.  Musculoskeletal: He exhibits tenderness.  Swollen left ankle joint with effusion. Tender to palpation decreased ROM 2/2 pain. Warm to touch.  Neurological: He is alert and oriented to person, place, and time.  Skin: He is not diaphoretic.  Psychiatric: He has a normal mood and affect. His behavior is normal.    ED Course  Procedures (including critical care time) Labs Review Labs Reviewed - No data to display Imaging Review No results found.  EKG Interpretation   None       MDM   1. Gout     1. Gout  The patients ankle pain is likely secondary to gout. Septic joint is possible but unlikely. Administered steroids IM and discharged on short course of steroids, colchicine, allopurinol, and NSAIDs. Patient reports no history of Recommended f/u with PCP in 1 week. Instructed patient to return to ED for new or worsening symptoms.     Pleas Koch, MD 01/06/13 1112  Pleas Koch, MD 01/06/13 1112

## 2013-01-06 NOTE — ED Notes (Signed)
Pt to room 2 in w/c, able to stand and walk to bed. Pt laughing and smiling, reports 2 weeks of left toe pain, now in his left ankle. Pt states this is his usual gout presentation. Left ankle is swollen, warm to touch, painful and tender. Pt denies any injury, fevers or any other c/o.

## 2013-01-06 NOTE — ED Provider Notes (Signed)
I saw and evaluated the patient, reviewed the resident's note and I agree with the findings and plan.   .Face to face Exam:  General:  Awake HEENT:  Atraumatic Resp:  Normal effort Abd:  Nondistended Neuro:No focal weakness  Oakley Orban L Adore Kithcart, MD 01/06/13 1129 

## 2015-04-03 ENCOUNTER — Emergency Department (HOSPITAL_BASED_OUTPATIENT_CLINIC_OR_DEPARTMENT_OTHER): Payer: BLUE CROSS/BLUE SHIELD

## 2015-04-03 ENCOUNTER — Encounter (HOSPITAL_BASED_OUTPATIENT_CLINIC_OR_DEPARTMENT_OTHER): Payer: Self-pay | Admitting: *Deleted

## 2015-04-03 ENCOUNTER — Emergency Department (HOSPITAL_BASED_OUTPATIENT_CLINIC_OR_DEPARTMENT_OTHER)
Admission: EM | Admit: 2015-04-03 | Discharge: 2015-04-03 | Disposition: A | Discharge status: Home / Self Care | Payer: BLUE CROSS/BLUE SHIELD | Attending: Emergency Medicine | Admitting: Emergency Medicine

## 2015-04-03 DIAGNOSIS — Z79899 Other long term (current) drug therapy: Secondary | ICD-10-CM | POA: Diagnosis not present

## 2015-04-03 DIAGNOSIS — M1990 Unspecified osteoarthritis, unspecified site: Secondary | ICD-10-CM | POA: Diagnosis not present

## 2015-04-03 DIAGNOSIS — Z7982 Long term (current) use of aspirin: Secondary | ICD-10-CM | POA: Insufficient documentation

## 2015-04-03 DIAGNOSIS — Z87891 Personal history of nicotine dependence: Secondary | ICD-10-CM | POA: Insufficient documentation

## 2015-04-03 DIAGNOSIS — Z8719 Personal history of other diseases of the digestive system: Secondary | ICD-10-CM | POA: Insufficient documentation

## 2015-04-03 DIAGNOSIS — R05 Cough: Secondary | ICD-10-CM | POA: Diagnosis present

## 2015-04-03 DIAGNOSIS — M109 Gout, unspecified: Secondary | ICD-10-CM | POA: Insufficient documentation

## 2015-04-03 DIAGNOSIS — Z972 Presence of dental prosthetic device (complete) (partial): Secondary | ICD-10-CM | POA: Insufficient documentation

## 2015-04-03 DIAGNOSIS — Z7951 Long term (current) use of inhaled steroids: Secondary | ICD-10-CM | POA: Diagnosis not present

## 2015-04-03 DIAGNOSIS — J209 Acute bronchitis, unspecified: Secondary | ICD-10-CM

## 2015-04-03 DIAGNOSIS — E7800 Pure hypercholesterolemia, unspecified: Secondary | ICD-10-CM | POA: Insufficient documentation

## 2015-04-03 DIAGNOSIS — I10 Essential (primary) hypertension: Secondary | ICD-10-CM | POA: Insufficient documentation

## 2015-04-03 MED ORDER — AEROCHAMBER PLUS W/MASK MISC: Status: DC

## 2015-04-03 MED ORDER — ALBUTEROL SULFATE HFA 108 (90 BASE) MCG/ACT IN AERS: 2.0000 | INHALATION_SPRAY | RESPIRATORY_TRACT | Status: DC | PRN

## 2015-04-03 MED FILL — VENTOLIN HFA 90 MCG INHALER: 108 (90 BAS | 30 days supply | Qty: 18 | Fill #0

## 2015-04-03 NOTE — ED Provider Notes (Signed)
CSN: [id number]     Arrival date & time 04/03/15  1417 History   First MD Initiated Contact with Patient 04/03/15 1647     Chief Complaint  Patient presents with  . URI     (Consider location/radiation/quality/duration/timing/severity/associated sxs/prior Treatment) HPI Planes of cough productive of yellow sputum onset 4 days ago. Cough has since become nonproductive. Also complains of nasal congestion and sneezing. Denies any fever. No treatment prior to coming here. Other associated symptoms include shortness of breath, mild. Nothing makes symptoms better or worse. No other associated symptoms. Past Medical History  Diagnosis Date  . Wears dentures     bottom front  . Hypertension   . Hernia   . Arthritis   . Hypercholesteremia   . Gout    Past Surgical History  Procedure Laterality Date  . Shoulder surgery      right  . Replacement total knee      partial  . Hernia repair     No family history on file. Social History  Substance Use Topics  . Smoking status: Former Games developer  . Smokeless tobacco: None  . Alcohol Use: No   no alcohol tobacco or illegal drugs for 17 years  Review of Systems  Constitutional: Negative.   HENT: Positive for congestion and sneezing.   Respiratory: Positive for cough and shortness of breath.   Cardiovascular: Negative.   Gastrointestinal: Negative.   Musculoskeletal: Negative.   Skin: Negative.   Neurological: Negative.   Psychiatric/Behavioral: Negative.   All other systems reviewed and are negative.     Allergies  Morphine and related  Home Medications   Prior to Admission medications   Medication Sig Start Date End Date Taking? Authorizing Provider  allopurinol (ZYLOPRIM) 300 MG tablet Take 300 mg by mouth daily as needed. For gout episode     Historical Provider, MD  amLODipine (NORVASC) 5 MG tablet Take 5 mg by mouth every morning.     Historical Provider, MD  aspirin EC 81 MG tablet Take 81 mg by mouth every morning.     Historical Provider, MD  colchicine 0.6 MG tablet Take 1 tablet (0.6 mg total) by mouth daily. 01/06/13   Bobbye Charleston, MD  fluticasone (FLONASE) 50 MCG/ACT nasal spray Place 2 sprays into the nose daily as needed for allergies.  07/16/10   Historical Provider, MD  HYDROcodone-acetaminophen (NORCO/VICODIN) 5-325 MG per tablet Take 1 tablet by mouth every 4 (four) hours as needed for pain.    Historical Provider, MD  ibuprofen (ADVIL,MOTRIN) 800 MG tablet Take 1 tablet (800 mg total) by mouth every 8 (eight) hours as needed. 01/06/13   Bobbye Charleston, MD  methocarbamol (ROBAXIN) 500 MG tablet Take 500 mg by mouth every 6 (six) hours as needed (FOR MUSCLE PAIN).    Historical Provider, MD  oxyCODONE-acetaminophen (PERCOCET/ROXICET) 5-325 MG per tablet Take 2 tablets by mouth every 6 (six) hours as needed for pain. 07/12/12   Doug Sou, MD  pantoprazole (PROTONIX) 40 MG tablet Take 40 mg by mouth 2 (two) times daily.    Historical Provider, MD  predniSONE (DELTASONE) 50 MG tablet Once daily for 7 days 01/06/13   Bobbye Charleston, MD  simvastatin (ZOCOR) 40 MG tablet Take 40 mg by mouth every other day.     Historical Provider, MD   BP 139/90 mmHg  Pulse 69  Temp(Src) 98.2 F (36.8 C) (Oral)  Resp 20  Ht  (1.803 m)  Wt 254 lb (115.214 kg)  BMI 35.44 kg/m2  SpO2 99% Physical Exam  Constitutional: He appears well-developed and well-nourished. No distress.  HENT:  Head: Normocephalic and atraumatic.  Right Ear: External ear normal.  Left Ear: External ear normal.  Mouth/Throat: Oropharynx is clear and moist. No oropharyngeal exudate.  Bilateral tympanic membranes normal nasal congestion  Eyes: Conjunctivae are normal. Pupils are equal, round, and reactive to light.  Neck: Neck supple. No tracheal deviation present. No thyromegaly present.  Cardiovascular: Normal rate and regular rhythm.   No murmur heard. Pulmonary/Chest: Effort normal and breath sounds  normal.  Abdominal: Soft. Bowel sounds are normal. He exhibits no distension. There is no tenderness.  Musculoskeletal: Normal range of motion. He exhibits no edema or tenderness.  Neurological: He is alert. Coordination normal.  Skin: Skin is warm and dry. No rash noted.  Psychiatric: He has a normal mood and affect.  Nursing note and vitals reviewed.   ED Course  Procedures (including critical care time) Labs Review Labs Reviewed - No data to display  Imaging Review No results found. I have personally reviewed and evaluated these images and lab results as part of my medical decision-making.   EKG Interpretation None     Chest x-ray reviewed by me Results for orders placed or performed during the hospital encounter of 07/12/12  CBC  Result Value Ref Range   WBC 10.2 4.0 - 10.5 K/uL   RBC 5.11 4.22 - 5.81 MIL/uL   Hemoglobin 14.1 13.0 - 17.0 g/dL   HCT 56.2 13.0 - 86.5 %   MCV 79.8 78.0 - 100.0 fL   MCH 27.6 26.0 - 34.0 pg   MCHC 34.6 30.0 - 36.0 g/dL   RDW 78.4 69.6 - 29.5 %   Platelets 204 150 - 400 K/uL  D-dimer, quantitative  Result Value Ref Range   D-Dimer, Quant 0.70 (H) 0.00 - 0.48 ug/mL-FEU  I-STAT, chem 8  Result Value Ref Range   Sodium 140 135 - 145 mEq/L   Potassium 4.1 3.5 - 5.1 mEq/L   Chloride 106 96 - 112 mEq/L   BUN 11 6 - 23 mg/dL   Creatinine, Ser 2.84 0.50 - 1.35 mg/dL   Glucose, Bld 132 (H) 70 - 99 mg/dL   Calcium, Ion 4.40 1.02 - 1.23 mmol/L   TCO2 28 0 - 100 mmol/L   Hemoglobin 15.3 13.0 - 17.0 g/dL   HCT 72.5 36.6 - 44.0 %  POCT i-Stat troponin I  Result Value Ref Range   Troponin i, poc 0.01 0.00 - 0.08 ng/mL   Comment 3           Dg Chest 2 View  04/03/2015  CLINICAL DATA:  Cough, headache and runny nose EXAM: CHEST  2 VIEW COMPARISON:  07/12/2012. FINDINGS: There is asymmetric elevation of left hemidiaphragm. There is no pleural effusion or edema identified. The lungs are clear. No airspace consolidation. No focal bone abnormality  noted. IMPRESSION: 1. No acute cardiopulmonary abnormalities. 2. 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 Electronically Signed   By: Signa Kell M.D.   On: 04/03/2015 16:47   Chest xray viewed by me MDM  Plan prescription albuterol HFA with spacer to use 2 puffs every 4 hours when necessary shortness of breath or cough. Return if needed more than every 4 hours or if condition worsens. Follow-up with primary care physician if not better in 10 days Diagnosis acute bronchitis Final diagnoses:  None        Doug Sou, MD 04/03/15 1701

## 2015-04-03 NOTE — Discharge Instructions (Signed)
Acute Bronchitis Use your inhaler 2 puffs every 4 hours as needed for cough or shortness of breath. Return if bleeding more than every 4 hours or if concern for any reason. See your primary care physician if not getting better in 10 days. Bronchitis is when the airways that extend from the windpipe into the lungs get red, puffy, and painful (inflamed). Bronchitis often causes thick spit (mucus) to develop. This leads to a cough. A cough is the most common symptom of bronchitis. In acute bronchitis, the condition usually begins suddenly and goes away over time (usually in 2 weeks). Smoking, allergies, and asthma can make bronchitis worse. Repeated episodes of bronchitis may cause more lung problems. HOME CARE  Rest.  Drink enough fluids to keep your pee (urine) clear or pale yellow (unless you need to limit fluids as told by your doctor).  Only take over-the-counter or prescription medicines as told by your doctor.  Avoid smoking and secondhand smoke. These can make bronchitis worse. If you are a smoker, think about using nicotine gum or skin patches. Quitting smoking will help your lungs heal faster.  Reduce the chance of getting bronchitis again by:  Washing your hands often.  Avoiding people with cold symptoms.  Trying not to touch your hands to your mouth, nose, or eyes.  Follow up with your doctor as told. GET HELP IF: Your symptoms do not improve after 1 week of treatment. Symptoms include:  Cough.  Fever.  Coughing up thick spit.  Body aches.  Chest congestion.  Chills.  Shortness of breath.  Sore throat. GET HELP RIGHT AWAY IF:   You have an increased fever.  You have chills.  You have severe shortness of breath.  You have bloody thick spit (sputum).  You throw up (vomit) often.  You lose too much body fluid (dehydration).  You have a severe headache.  You faint. MAKE SURE YOU:   Understand these instructions.  Will watch your condition.  Will  get help right away if you are not doing well or get worse.   This information is not intended to replace advice given to you by your health care provider. Make sure you discuss any questions you have with your health care provider.   Document Released: 07/23/2007 Document Revised: 10/06/2012 Document Reviewed: 07/27/2012 Elsevier Interactive Patient Education [employer]

## 2015-04-03 NOTE — ED Notes (Signed)
Cough, headache, runny nose and sneezing.

## 2015-04-03 NOTE — ED Notes (Signed)
cxr offered at triage but pt states he will wait and see if the MD feels it is necessary.

## 2015-09-11 DIAGNOSIS — E785 Hyperlipidemia, unspecified: Secondary | ICD-10-CM | POA: Insufficient documentation

## 2015-09-11 DIAGNOSIS — I10 Essential (primary) hypertension: Secondary | ICD-10-CM | POA: Insufficient documentation

## 2015-09-11 DIAGNOSIS — E1169 Type 2 diabetes mellitus with other specified complication: Secondary | ICD-10-CM | POA: Insufficient documentation

## 2016-05-20 DIAGNOSIS — M109 Gout, unspecified: Secondary | ICD-10-CM

## 2016-05-20 HISTORY — DX: Gout, unspecified: M10.9

## 2017-01-26 ENCOUNTER — Ambulatory Visit: Payer: BLUE CROSS/BLUE SHIELD | Admitting: Nurse Practitioner

## 2017-02-23 ENCOUNTER — Ambulatory Visit (INDEPENDENT_AMBULATORY_CARE_PROVIDER_SITE_OTHER): Payer: Managed Care, Other (non HMO) | Admitting: Nurse Practitioner

## 2017-02-23 ENCOUNTER — Encounter: Payer: Self-pay | Admitting: Nurse Practitioner

## 2017-02-23 VITALS — BP 114/76 | HR 75 | Temp 98.0°F | Ht 71.0 in | Wt 263.0 lb

## 2017-02-23 DIAGNOSIS — Z125 Encounter for screening for malignant neoplasm of prostate: Secondary | ICD-10-CM | POA: Diagnosis not present

## 2017-02-23 DIAGNOSIS — Z0001 Encounter for general adult medical examination with abnormal findings: Secondary | ICD-10-CM

## 2017-02-23 DIAGNOSIS — E790 Hyperuricemia without signs of inflammatory arthritis and tophaceous disease: Secondary | ICD-10-CM

## 2017-02-23 DIAGNOSIS — M109 Gout, unspecified: Secondary | ICD-10-CM | POA: Insufficient documentation

## 2017-02-23 DIAGNOSIS — I10 Essential (primary) hypertension: Secondary | ICD-10-CM | POA: Diagnosis not present

## 2017-02-23 DIAGNOSIS — E782 Mixed hyperlipidemia: Secondary | ICD-10-CM

## 2017-02-23 DIAGNOSIS — Z1211 Encounter for screening for malignant neoplasm of colon: Secondary | ICD-10-CM

## 2017-02-23 DIAGNOSIS — K219 Gastro-esophageal reflux disease without esophagitis: Secondary | ICD-10-CM | POA: Insufficient documentation

## 2017-02-23 DIAGNOSIS — R1013 Epigastric pain: Secondary | ICD-10-CM | POA: Insufficient documentation

## 2017-02-23 DIAGNOSIS — E119 Type 2 diabetes mellitus without complications: Secondary | ICD-10-CM | POA: Diagnosis not present

## 2017-02-23 MED ORDER — PANTOPRAZOLE SODIUM 40 MG PO TBEC: 40.0000 mg | DELAYED_RELEASE_TABLET | Freq: Every day | ORAL | 1 refills | Status: DC

## 2017-02-23 MED ORDER — OMEPRAZOLE 20 MG PO CPDR: 20.0000 mg | DELAYED_RELEASE_CAPSULE | Freq: Every day | ORAL | 3 refills | Status: DC

## 2017-02-23 NOTE — Patient Instructions (Addendum)
Normal cbc, TSH, and PSA. CMP indicates normal renal and liver function, but elevated glucose with HgbA1c of 6.5. This indicates diabetes. I recommend to continue heart healthy diet and regular exercise. Will labs in 6months, if no improvement, will need medication. Uric acid is elevated at 8.6. Need to take allopurinol 100mg  twice a day. New rx sent. Lipid panel indicates elevated LDL and low HDL. Need to continue simvastatin  Health Maintenance, Male A healthy lifestyle and preventive care is important for your health and wellness. Ask your health care provider about what schedule of regular examinations is right for you. What should I know about weight and diet? Eat a Healthy Diet  Eat plenty of vegetables, fruits, whole grains, low-fat dairy products, and lean protein.  Do not eat a lot of foods high in solid fats, added sugars, or salt.  Maintain a Healthy Weight Regular exercise can help you achieve or maintain a healthy weight. You should:  Do at least 150 minutes of exercise each week. The exercise should increase your heart rate and make you sweat (moderate-intensity exercise).  Do strength-training exercises at least twice a week.  Watch Your Levels of Cholesterol and Blood Lipids  Have your blood tested for lipids and cholesterol every 5 years starting at 65 years of age. If you are at high risk for heart disease, you should start having your blood tested when you are 65 years old. You may need to have your cholesterol levels checked more often if: ? Your lipid or cholesterol levels are high. ? You are older than 65 years of age. ? You are at high risk for heart disease.  What should I know about cancer screening? Many types of cancers can be detected early and may often be prevented. Lung Cancer  You should be screened every year for lung cancer if: ? You are a current smoker who has smoked for at least 30 years. ? You are a former smoker who has quit within the past 15  years.  Talk to your health care provider about your screening options, when you should start screening, and how often you should be screened.  Colorectal Cancer  Routine colorectal cancer screening usually begins at 65 years of age and should be repeated every 5-10 years until you are 65 years old. You may need to be screened more often if early forms of precancerous polyps or small growths are found. Your health care provider may recommend screening at an earlier age if you have risk factors for colon cancer.  Your health care provider may recommend using home test kits to check for hidden blood in the stool.  A small camera at the end of a tube can be used to examine your colon (sigmoidoscopy or colonoscopy). This checks for the earliest forms of colorectal cancer.  Prostate and Testicular Cancer  Depending on your age and overall health, your health care provider may do certain tests to screen for prostate and testicular cancer.  Talk to your health care provider about any symptoms or concerns you have about testicular or prostate cancer.  Skin Cancer  Check your skin from head to toe regularly.  Tell your health care provider about any new moles or changes in moles, especially if: ? There is a change in a mole's size, shape, or color. ? You have a mole that is larger than a pencil eraser.  Always use sunscreen. Apply sunscreen liberally and repeat throughout the day.  Protect yourself by wearing long sleeves,  pants, a wide-brimmed hat, and sunglasses when outside.  What should I know about heart disease, diabetes, and high blood pressure?  If you are 70-66 years of age, have your blood pressure checked every 3-5 years. If you are 55 years of age or older, have your blood pressure checked every year. You should have your blood pressure measured twice-once when you are at a hospital or clinic, and once when you are not at a hospital or clinic. Record the average of the two  measurements. To check your blood pressure when you are not at a hospital or clinic, you can use: ? An automated blood pressure machine at a pharmacy. ? A home blood pressure monitor.  Talk to your health care provider about your target blood pressure.  If you are between 16-58 years old, ask your health care provider if you should take aspirin to prevent heart disease.  Have regular diabetes screenings by checking your fasting blood sugar level. ? If you are at a normal weight and have a low risk for diabetes, have this test once every three years after the age of 83. ? If you are overweight and have a high risk for diabetes, consider being tested at a younger age or more often.  A one-time screening for abdominal aortic aneurysm (AAA) by ultrasound is recommended for men aged 23-75 years who are current or former smokers. What should I know about preventing infection? Hepatitis B If you have a higher risk for hepatitis B, you should be screened for this virus. Talk with your health care provider to find out if you are at risk for hepatitis B infection. Hepatitis C Blood testing is recommended for:  Everyone born from [birth year] through 1963.  Anyone with known risk factors for hepatitis C.  Sexually Transmitted Diseases (STDs)  You should be screened each year for STDs including gonorrhea and chlamydia if: ? You are sexually active and are younger than 65 years of age. ? You are older than 65 years of age and your health care provider tells you that you are at risk for this type of infection. ? Your sexual activity has changed since you were last screened and you are at an increased risk for chlamydia or gonorrhea. Ask your health care provider if you are at risk.  Talk with your health care provider about whether you are at high risk of being infected with HIV. Your health care provider may recommend a prescription medicine to help prevent HIV infection.  What else can I do?  Schedule  regular health, dental, and eye exams.  Stay current with your vaccines (immunizations).  Do not use any tobacco products, such as cigarettes, chewing tobacco, and e-cigarettes. If you need help quitting, ask your health care provider.  Limit alcohol intake to no more than 2 drinks per day. One drink equals 12 ounces of beer, 5 ounces of wine, or 1 ounces of hard liquor.  Do not use street drugs.  Do not share needles.  Ask your health care provider for help if you need support or information about quitting drugs.  Tell your health care provider if you often feel depressed.  Tell your health care provider if you have ever been abused or do not feel safe at home. This information is not intended to replace advice given to you by your health care provider. Make sure you discuss any questions you have with your health care provider. Document Released: 08/02/2007 Document Revised: 10/03/2015 Document Reviewed: 11/07/2014 Elsevier  Interactive Patient Education  [employer].

## 2017-02-23 NOTE — Progress Notes (Signed)
Subjective:    Patient ID: Marvin [name], male    DOB: [birth year], 67 y.o.   MRN: [id number]  Patient presents today for complete physical  HPI  No CPE in  2years. Previous pcp with [facility]: Dr. Olga Millers.  HTN: Stable per patient Does not check BP at home. BP Readings from Last 3 Encounters:  02/23/17 114/76  04/03/15 139/90  01/06/13 122/79    GERD: Epigastric pain with use of NSAIDs. Has not been using protonix as prescribed.  Gout: No joint pain or swelling since 05/2016. Use of colchicine prn.  Immunizations: (TDAP, Hep C screen, Pneumovax, Influenza, zoster)  Health Maintenance  Topic Date Due  . Hemoglobin A1C  [birth year]  .  Hepatitis C: One time screening is recommended by Center for Disease Control  (CDC) for  adults born from [birth year] through 1963.   Apr 04, 1950  . Pneumococcal vaccine (1) 02/05/1955  . Complete foot exam   02/05/1963  . Eye exam for diabetics  02/05/1963  . HIV Screening  02/05/1968  . Tetanus Vaccine  02/05/1972  . Colon Cancer Screening  02/05/2003  . Flu Shot  Completed   Diet:heart healthy.  Weight:  Wt Readings from Last 3 Encounters:  02/23/17 263 lb (119.3 kg)  04/03/15 254 lb (115.2 kg)  01/06/13 246 lb (111.6 kg)    Exercise:waking daily  Fall Risk: Fall Risk  02/23/2017  Falls in the past year? No   Home Safety: home with wife.  Depression/Suicide: Depression screen [facility] 2/9 02/23/2017  Decreased Interest 0  Down, Depressed, Hopeless 0  PHQ - 2 Score 0   No flowsheet data found. Colonoscopy (every 5-34yrs, >50-46yrs): last done by Dr. Loreta Ave (normal per patient), last done 2015 per patient, needs repeat.  Vision:up to date.  Dental:up to date.  Advanced Directive: Advanced Directives 04/03/2015  Does Patient Have a Medical Advance Directive? No     Medications and allergies reviewed with patient and updated if appropriate.  Patient Active Problem List   Diagnosis Date Noted  .  Gastroesophageal reflux disease 02/23/2017  . Gouty arthritis 02/23/2017  . Essential hypertension 02/23/2017  . Postop check 09/03/2010    Current Outpatient Medications on File Prior to Visit  Medication Sig Dispense Refill  . aspirin EC 81 MG tablet Take 81 mg by mouth every morning.    . Cyclobenzaprine HCl (FLEXERIL PO) Take by mouth.    . Dentifrices (FLUORIDE TOOTHPASTE DT) Place onto teeth.    . fluticasone (FLONASE) 50 MCG/ACT nasal spray Place 2 sprays into the nose daily as needed for allergies.     Marland Kitchen ibuprofen (ADVIL,MOTRIN) 800 MG tablet Take 1 tablet (800 mg total) by mouth every 8 (eight) hours as needed. 30 tablet 0  . Spacer/Aero-Holding Chambers (AEROCHAMBER PLUS WITH MASK) inhaler Use as instructed 1 each 2  . albuterol (PROVENTIL HFA;VENTOLIN HFA) 108 (90 Base) MCG/ACT inhaler Inhale 2 puffs into the lungs every 2 (two) hours as needed for wheezing or shortness of breath (cough). (Patient not taking: Reported on 02/23/2017) 1 Inhaler 0  . HYDROcodone-acetaminophen (NORCO/VICODIN) 5-325 MG per tablet Take 1 tablet by mouth every 4 (four) hours as needed for pain.    . methocarbamol (ROBAXIN) 500 MG tablet Take 500 mg by mouth every 6 (six) hours as needed (FOR MUSCLE PAIN).     No current facility-administered medications on file prior to visit.     Past Medical History:  Diagnosis Date  . Alcohol abuse   . Allergy   .  Arthritis   . Drug abuse (HCC)   . Gout   . Hernia   . Hypercholesteremia   . Hypertension   . Wears dentures    bottom front    Past Surgical History:  Procedure Laterality Date  . HERNIA REPAIR    . REPLACEMENT TOTAL KNEE     partial  . SHOULDER SURGERY     right    Social History   Socioeconomic History  . Marital status: Married    Spouse name: None  . Number of children: None  . Years of education: None  . Highest education level: None  Social Needs  . Financial resource strain: None  . Food insecurity - worry: None  . Food  insecurity - inability: None  . Transportation needs - medical: None  . Transportation needs - non-medical: None  Occupational History  . None  Tobacco Use  . Smoking status: Former Smoker    Types: Cigarettes  . Smokeless tobacco: Former NeurosurgeonUser    Types: Chew  Substance and Sexual Activity  . Alcohol use: No  . Drug use: No  . Sexual activity: Yes    Birth control/protection: Condom  Other Topics Concern  . None  Social History Narrative  . None    Family History  Problem Relation Age of Onset  . Cancer Brother        bone cancer  . Cancer Maternal Aunt        breast cancer  . Cancer Maternal Uncle        lukemia  . Cancer Cousin        unknown  . Cancer Maternal Uncle        lukemia  . Cancer Paternal Uncle        prostate  . Hearing loss Maternal Grandfather 70        Review of Systems  Constitutional: Negative for fever, malaise/fatigue and weight loss.  HENT: Negative for congestion and sore throat.   Eyes:       Negative for visual changes  Respiratory: Negative for cough and shortness of breath.   Cardiovascular: Negative for chest pain, palpitations and leg swelling.  Gastrointestinal: Positive for heartburn. Negative for blood in stool, constipation, diarrhea and melena.  Genitourinary: Negative for dysuria, frequency and urgency.  Musculoskeletal: Negative for falls, joint pain and myalgias.  Skin: Negative for rash.  Neurological: Negative for dizziness, sensory change and headaches.  Endo/Heme/Allergies: Does not bruise/bleed easily.  Psychiatric/Behavioral: Negative for depression, substance abuse and suicidal ideas. The patient is not nervous/anxious and does not have insomnia.     Objective:   Vitals:   02/23/17 1420  BP: 114/76  Pulse: 75  Temp: 98 F (36.7 C)  SpO2: 96%    Body mass index is 36.68 kg/m.   Physical Examination:  Physical Exam  Constitutional: He is oriented to person, place, and time and well-developed,  well-nourished, and in no distress. No distress.  HENT:  Right Ear: External ear normal.  Left Ear: External ear normal.  Nose: Nose normal.  Mouth/Throat: Oropharynx is clear and moist. No oropharyngeal exudate.  Eyes: Conjunctivae and EOM are normal. Pupils are equal, round, and reactive to light. No scleral icterus.  Neck: Normal range of motion. Neck supple. No thyromegaly present.  Cardiovascular: Normal rate, regular rhythm, normal heart sounds and intact distal pulses.  Pulmonary/Chest: Effort normal and breath sounds normal.  Abdominal: Bowel sounds are normal. He exhibits no distension. There is no tenderness.  Genitourinary: Prostate  normal. Rectal exam shows external hemorrhoid. Rectal exam shows no laceration, no tenderness and guaiac negative stool.  Musculoskeletal: Normal range of motion. He exhibits no edema or tenderness.  Lymphadenopathy:    He has no cervical adenopathy.  Neurological: He is alert and oriented to person, place, and time. He has normal reflexes. No cranial nerve deficit. Gait normal.  Skin: Skin is warm and dry.  Psychiatric: Affect and judgment normal.  Vitals reviewed.   ASSESSMENT and PLAN:  Braeson was seen today for establish care.  Diagnoses and all orders for this visit:  Encounter for preventative adult health care exam with abnormal findings -     Ambulatory referral to Gastroenterology -     CBC with Differential/Platelet -     Comprehensive metabolic panel -     Hemoglobin A1c -     TSH -     Lipid panel -     IFOBT POC (occult bld, rslt in office); Future -     PSA  Colon cancer screening -     Ambulatory referral to Gastroenterology -     IFOBT POC (occult bld, rslt in office); Future  Essential hypertension -     amLODipine (NORVASC) 5 MG tablet; Take 1 tablet (5 mg total) by mouth every morning.  Mixed hyperlipidemia -     Lipid panel -     simvastatin (ZOCOR) 40 MG tablet; Take 1 tablet (40 mg total) by mouth every other  day.  Prostate cancer screening  Type 2 diabetes mellitus without complication, without long-term current use of insulin (HCC) -     Hemoglobin A1c  Gastroesophageal reflux disease, esophagitis presence not specified -     Discontinue: omeprazole (PRILOSEC) 20 MG capsule; Take 1 capsule (20 mg total) by mouth daily. -     pantoprazole (PROTONIX) 40 MG tablet; Take 1 tablet (40 mg total) by mouth daily.  Hyperuricemia -     Uric acid -     allopurinol (ZYLOPRIM) 100 MG tablet; Take 1 tablet (100 mg total) by mouth 2 (two) times daily.   No problem-specific Assessment & Plan notes found for this encounter.     Follow up: Return in about 6 months (around 08/23/2017) for HTN.  Alysia Penna, NP

## 2017-02-24 ENCOUNTER — Encounter: Payer: Self-pay | Admitting: Nurse Practitioner

## 2017-02-24 ENCOUNTER — Telehealth: Payer: Self-pay | Admitting: Nurse Practitioner

## 2017-02-24 LAB — CBC WITH DIFFERENTIAL/PLATELET
Basophils Absolute: 31 cells/uL (ref 0–200)
Basophils Relative: 0.3 %
Eosinophils Absolute: 112 cells/uL (ref 15–500)
Eosinophils Relative: 1.1 %
HCT: 47.4 % (ref 38.5–50.0)
Hemoglobin: 15.7 g/dL (ref 13.2–17.1)
Lymphs Abs: 2295 cells/uL (ref 850–3900)
MCH: 27.1 pg (ref 27.0–33.0)
MCHC: 33.1 g/dL (ref 32.0–36.0)
MCV: 81.9 fL (ref 80.0–100.0)
MPV: 11.6 fL (ref 7.5–12.5)
Monocytes Relative: 7.9 %
Neutro Abs: 6956 cells/uL (ref 1500–7800)
Neutrophils Relative %: 68.2 %
Platelets: 228 10*3/uL (ref 140–400)
RBC: 5.79 10*6/uL (ref 4.20–5.80)
RDW: 14.6 % (ref 11.0–15.0)
Total Lymphocyte: 22.5 %
WBC mixed population: 806 cells/uL (ref 200–950)
WBC: 10.2 10*3/uL (ref 3.8–10.8)

## 2017-02-24 LAB — HEMOGLOBIN A1C
Hgb A1c MFr Bld: 6.5 % of total Hgb — ABNORMAL HIGH (ref <5.7)
Mean Plasma Glucose: 140 (calc)
eAG (mmol/L): 7.7 (calc)

## 2017-02-24 LAB — LIPID PANEL
Cholesterol: 227 mg/dL — ABNORMAL HIGH (ref <200)
HDL: 36 mg/dL — ABNORMAL LOW (ref >40)
LDL Cholesterol (Calc): 163 mg/dL (calc) — ABNORMAL HIGH
Non-HDL Cholesterol (Calc): 191 mg/dL (calc) — ABNORMAL HIGH (ref <130)
Total CHOL/HDL Ratio: 6.3 (calc) — ABNORMAL HIGH (ref <5.0)
Triglycerides: 141 mg/dL (ref <150)

## 2017-02-24 LAB — COMPREHENSIVE METABOLIC PANEL
AG Ratio: 1.5 (calc) (ref 1.0–2.5)
ALT: 21 U/L (ref 9–46)
AST: 18 U/L (ref 10–35)
Albumin: 4.6 g/dL (ref 3.6–5.1)
Alkaline phosphatase (APISO): 84 U/L (ref 40–115)
BUN: 13 mg/dL (ref 7–25)
CO2: 22 mmol/L (ref 20–32)
Calcium: 9.7 mg/dL (ref 8.6–10.3)
Chloride: 102 mmol/L (ref 98–110)
Creat: 1.03 mg/dL (ref 0.70–1.25)
Globulin: 3.1 g/dL (calc) (ref 1.9–3.7)
Glucose, Bld: 101 mg/dL — ABNORMAL HIGH (ref 65–99)
Potassium: 4.3 mmol/L (ref 3.5–5.3)
Sodium: 140 mmol/L (ref 135–146)
Total Bilirubin: 0.5 mg/dL (ref 0.2–1.2)
Total Protein: 7.7 g/dL (ref 6.1–8.1)

## 2017-02-24 LAB — URIC ACID: Uric Acid, Serum: 8.6 mg/dL — ABNORMAL HIGH (ref 4.0–8.0)

## 2017-02-24 LAB — TSH: TSH: 1.48 mIU/L (ref 0.40–4.50)

## 2017-02-24 LAB — PSA: PSA: 0.5 ng/mL (ref <=4.0)

## 2017-02-24 MED ORDER — SIMVASTATIN 40 MG PO TABS: 40.0000 mg | ORAL_TABLET | ORAL | 1 refills | Status: DC

## 2017-02-24 MED ORDER — ALLOPURINOL 100 MG PO TABS: 100.0000 mg | ORAL_TABLET | Freq: Two times a day (BID) | ORAL | 1 refills | Status: DC

## 2017-02-24 MED ORDER — AMLODIPINE BESYLATE 5 MG PO TABS: 5.0000 mg | ORAL_TABLET | Freq: Every morning | ORAL | 1 refills | Status: DC

## 2017-02-24 NOTE — Telephone Encounter (Signed)
Copied from CRM [id number]. Topic: Quick Communication - See Telephone Encounter >> Feb 24, 2017  1:28 PM Oneal GroutSebastian, Jennifer S wrote: CRM for notification. See Telephone encounter for: Would like to speak to someone regarding allopurinol (ZYLOPRIM) 100 MG tablet, he does not want to take it  02/24/17.

## 2017-02-24 NOTE — Telephone Encounter (Signed)
Spoke with the pt.  

## 2017-04-20 ENCOUNTER — Encounter: Payer: Self-pay | Admitting: Nurse Practitioner

## 2017-04-20 ENCOUNTER — Ambulatory Visit (INDEPENDENT_AMBULATORY_CARE_PROVIDER_SITE_OTHER): Payer: Managed Care, Other (non HMO) | Admitting: Nurse Practitioner

## 2017-04-20 ENCOUNTER — Ambulatory Visit: Payer: Self-pay | Admitting: *Deleted

## 2017-04-20 VITALS — BP 122/78 | HR 75 | Temp 97.5°F | Ht 71.0 in | Wt 271.0 lb

## 2017-04-20 DIAGNOSIS — E790 Hyperuricemia without signs of inflammatory arthritis and tophaceous disease: Secondary | ICD-10-CM

## 2017-04-20 DIAGNOSIS — M109 Gout, unspecified: Secondary | ICD-10-CM | POA: Diagnosis not present

## 2017-04-20 MED ORDER — METHYLPREDNISOLONE ACETATE 40 MG/ML IJ SUSP
40.0000 mg | Freq: Once | INTRAMUSCULAR | Status: AC
  Administered 2017-04-20: 40 mg via INTRAMUSCULAR

## 2017-04-20 MED ORDER — ALLOPURINOL 100 MG PO TABS: 100.0000 mg | ORAL_TABLET | Freq: Every day | ORAL | 1 refills | Status: DC

## 2017-04-20 MED ORDER — PREDNISONE 20 MG PO TABS
40.0000 mg | ORAL_TABLET | Freq: Every day | ORAL | 0 refills | Status: DC
Start: 2017-04-21 — End: 2017-07-16

## 2017-04-20 MED ORDER — TRAMADOL HCL 50 MG PO TABS: 50.0000 mg | ORAL_TABLET | Freq: Two times a day (BID) | ORAL | 0 refills | Status: DC | PRN

## 2017-04-20 MED ORDER — COLCHICINE 0.6 MG PO TABS: ORAL_TABLET | ORAL | 0 refills | Status: DC

## 2017-04-20 NOTE — Patient Instructions (Addendum)
Start oral prednisone tomorrow.  Start colchicine if no improvement with oral prednisone.  Do not take colchicine  And simvastatin at same time. Hold simvastatin x 2days while using colchicine.  Push oral hydration. Apply cold compress as needed. Elevate foot as much as possible.  Go to lab for urine collection.

## 2017-04-20 NOTE — Telephone Encounter (Signed)
FYI

## 2017-04-20 NOTE — Telephone Encounter (Signed)
Pt called for a gout flare and is requesting prednisone. He states the allopurinol is making it worst. [place]  His c/o this morning and is the swollen left ankle that is painful. Appointment made for today with his pcp.  Reason for Disposition . [1] Very swollen joint AND [2] no fever  Answer Assessment - Initial Assessment Questions 1. LOCATION: "Which joint is swollen?"     Left ankle 2. ONSET: "When did the swelling start?"     2 days ago 3. SIZE: "How large is the swelling?"     Size of a marble 4. PAIN: "Is there any pain?" If so, ask: "How bad is it?" (Scale 1-10; or mild, moderate, severe)     Yes #8 pain 5. CAUSE: "What do you think caused the swollen joint?"     gout 6. OTHER SYMPTOMS: "Do you have any other symptoms?" (e.g., fever, chest pain, difficulty breathing, calf pain)     no 7. PREGNANCY: "Is there any chance you are pregnant?" "When was your last menstrual period?"     n/a  Protocols used: ANKLE SWELLING-A-AH

## 2017-04-20 NOTE — Assessment & Plan Note (Signed)
Reports hx of increase gouty arthritis with use of allopurinol 300mg  once a day (use medication for 2751yrs, during that course he experience 2 gout exacerbation per year, experienced 1 gout exacerbation per year after stopping medication). Reports adequate oral hydration with water mostly. Reports other gout triggers (cheese and luncheon meats). Denies any change in diet since last office visit.  Decrease allopurinol dose to 100mg  once a day. Depo 40mg  given today. Oral prednisone 40mg  x 2days. Colchicine rx given to use only if no improvement with prednisone and tramadol.

## 2017-04-20 NOTE — Progress Notes (Signed)
Subjective:  Patient ID: SELAH ZELMAN, male    DOB: 03/24/1950  Age: 67 y.o. MRN: [id number]  CC: Gout (left ankle pain--cant work due to [employer] allopurinol/4 days/cochicine and prednison works in the past)   Ankle Pain   The incident occurred 2 days ago. There was no injury mechanism. The pain is present in the left ankle. The quality of the pain is described as aching and shooting. The pain is severe. The pain has been constant since onset. Associated symptoms include an inability to bear weight. Pertinent negatives include no loss of motion, loss of sensation, muscle weakness, numbness or tingling. The symptoms are aggravated by palpation, weight bearing and movement. He has tried acetaminophen and rest for the symptoms. The treatment provided no relief.  reports hx of increase gouty arthritis with use of allopurinol (use medication for 68yrs, during that course he experience 2 gout exacerbation per year, experienced 1 gout exacerbation per year after stopping medication). Reports adequate oral hydration with water mostly. Reports other gout triggers (cheese and luncheon meats). Denies any change in diet since last office visit.  Outpatient Medications Prior to Visit  Medication Sig Dispense Refill  . albuterol (PROVENTIL HFA;VENTOLIN HFA) 108 (90 Base) MCG/ACT inhaler Inhale 2 puffs into the lungs every 2 (two) hours as needed for wheezing or shortness of breath (cough). 1 Inhaler 0  . amLODipine (NORVASC) 5 MG tablet Take 1 tablet (5 mg total) by mouth every morning. 90 tablet 1  . aspirin EC 81 MG tablet Take 81 mg by mouth every morning.    . Cyclobenzaprine HCl (FLEXERIL PO) Take by mouth.    . Dentifrices (FLUORIDE TOOTHPASTE DT) Place onto teeth.    . fluticasone (FLONASE) 50 MCG/ACT nasal spray Place 2 sprays into the nose daily as needed for allergies.     Marland Kitchen ibuprofen (ADVIL,MOTRIN) 800 MG tablet Take 1 tablet (800 mg total) by mouth every 8 (eight) hours as needed. 30  tablet 0  . methocarbamol (ROBAXIN) 500 MG tablet Take 500 mg by mouth every 6 (six) hours as needed (FOR MUSCLE PAIN).    Marland Kitchen pantoprazole (PROTONIX) 40 MG tablet Take 1 tablet (40 mg total) by mouth daily. 90 tablet 1  . simvastatin (ZOCOR) 40 MG tablet Take 1 tablet (40 mg total) by mouth every other day. 90 tablet 1  . Spacer/Aero-Holding Chambers (AEROCHAMBER PLUS WITH MASK) inhaler Use as instructed 1 each 2  . HYDROcodone-acetaminophen (NORCO/VICODIN) 5-325 MG per tablet Take 1 tablet by mouth every 4 (four) hours as needed for pain.    Marland Kitchen allopurinol (ZYLOPRIM) 100 MG tablet Take 1 tablet (100 mg total) by mouth 2 (two) times daily. (Patient not taking: Reported on 04/20/2017) 180 tablet 1   No facility-administered medications prior to visit.     ROS See HPI  Objective:  BP 122/78   Pulse 75   Temp (!) 97.5 F (36.4 C)   Ht 5\' 11"  (1.803 m)   Wt 271 lb (122.9 kg)   SpO2 96%   BMI 37.80 kg/m   BP Readings from Last 3 Encounters:  04/20/17 122/78  02/23/17 114/76  04/03/15 139/90    Wt Readings from Last 3 Encounters:  04/20/17 271 lb (122.9 kg)  02/23/17 263 lb (119.3 kg)  04/03/15 254 lb (115.2 kg)    Physical Exam  Constitutional: He is oriented to person, place, and time.  Musculoskeletal: He exhibits edema and tenderness.       Right ankle: Normal.  Left ankle: He exhibits swelling. He exhibits no ecchymosis, no laceration and normal pulse. Tenderness. Medial malleolus tenderness found. No posterior TFL, no head of 5th metatarsal and no proximal fibula tenderness found. Achilles tendon normal.       Right foot: Normal.       Left foot: Normal.  Neurological: He is alert and oriented to person, place, and time.  Skin: Skin is warm and dry. No erythema.    Lab Results  Component Value Date   WBC 10.2 02/23/2017   HGB 15.7 02/23/2017   HCT 47.4 02/23/2017   PLT 228 02/23/2017   GLUCOSE 101 (H) 02/23/2017   CHOL 227 (H) 02/23/2017   TRIG 141 02/23/2017     HDL 36 (L) 02/23/2017   ALT 21 02/23/2017   AST 18 02/23/2017   NA 140 02/23/2017   K 4.3 02/23/2017   CL 102 02/23/2017   CREATININE 1.03 02/23/2017   BUN 13 02/23/2017   CO2 22 02/23/2017   TSH 1.48 02/23/2017   PSA 0.5 02/23/2017   INR 1.06 01/06/2011   HGBA1C 6.5 (H) 02/23/2017    Dg Chest 2 View  Result Date: 04/03/2015 CLINICAL DATA:  Cough, headache and runny nose EXAM: CHEST  2 VIEW COMPARISON:  07/12/2012. FINDINGS: There is asymmetric elevation of left hemidiaphragm. There is no pleural effusion or edema identified. The lungs are clear. No airspace consolidation. No focal bone abnormality noted. IMPRESSION: 1. No acute cardiopulmonary abnormalities. 2. 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 Electronically Signed   By: Signa Kell M.D.   On: 04/03/2015 16:47    Assessment & Plan:   Hayzen was seen today for gout.  Diagnoses and all orders for this visit:  Acute gout of left ankle, unspecified cause -     methylPREDNISolone acetate (DEPO-MEDROL) injection 40 mg -     predniSONE (DELTASONE) 20 MG tablet; Take 2 tablets (40 mg total) by mouth daily with breakfast. -     colchicine 0.6 MG tablet; Take 2tabs once, then 1tab 1hr later. Repeat dosing the next day if no improvement. -     traMADol (ULTRAM) 50 MG tablet; Take 1 tablet (50 mg total) by mouth every 12 (twelve) hours as needed.  Hyperuricemia -     allopurinol (ZYLOPRIM) 100 MG tablet; Take 1 tablet (100 mg total) by mouth daily. -     Uric acid, random urine  Gouty arthritis   I have discontinued Alfonse Alpers. Barraclough's HYDROcodone-acetaminophen. I have also changed his allopurinol. Additionally, I am having him start on predniSONE, colchicine, and traMADol. Lastly, I am having him maintain his fluticasone, aspirin EC, methocarbamol, ibuprofen, albuterol, aerochamber plus with mask, Cyclobenzaprine HCl (FLEXERIL PO), Dentifrices (FLUORIDE TOOTHPASTE DT), pantoprazole, amLODipine, and  simvastatin. We administered methylPREDNISolone acetate.  Meds ordered this encounter  Medications  . allopurinol (ZYLOPRIM) 100 MG tablet    Sig: Take 1 tablet (100 mg total) by mouth daily.    Dispense:  180 tablet    Refill:  1    Order Specific Question:   Supervising Provider    Answer:   Dianne Dun [[id number]]  . methylPREDNISolone acetate (DEPO-MEDROL) injection 40 mg  . predniSONE (DELTASONE) 20 MG tablet    Sig: Take 2 tablets (40 mg total) by mouth daily with breakfast.    Dispense:  4 tablet    Refill:  0    Order Specific Question:   Supervising Provider    Answer:   Dianne Dun [[id number]]  .  colchicine 0.6 MG tablet    Sig: Take 2tabs once, then 1tab 1hr later. Repeat dosing the next day if no improvement.    Dispense:  6 tablet    Refill:  0    Order Specific Question:   Supervising Provider    Answer:   Dianne DunARON, TALIA M [[id number]]  . traMADol (ULTRAM) 50 MG tablet    Sig: Take 1 tablet (50 mg total) by mouth every 12 (twelve) hours as needed.    Dispense:  4 tablet    Refill:  0    Order Specific Question:   Supervising Provider    Answer:   Dianne DunARON, TALIA M [[id number]]    Follow-up: Return if symptoms worsen or fail to improve.  Alysia Pennaharlotte Itxel Wickard, NP

## 2017-04-21 LAB — URIC ACID, RANDOM URINE: Uric Acid, Urine: 22 mg/dL

## 2017-07-16 ENCOUNTER — Other Ambulatory Visit: Payer: Self-pay | Admitting: Family Medicine

## 2017-07-16 ENCOUNTER — Ambulatory Visit: Payer: Self-pay | Admitting: *Deleted

## 2017-07-16 DIAGNOSIS — M109 Gout, unspecified: Secondary | ICD-10-CM

## 2017-07-16 MED ORDER — PREDNISONE 20 MG PO TABS: 40.0000 mg | ORAL_TABLET | Freq: Every day | ORAL | 0 refills | Status: AC

## 2017-07-16 NOTE — Telephone Encounter (Signed)
Please advise in Nche absence.

## 2017-07-16 NOTE — Telephone Encounter (Signed)
Pt having a flare up of gout in his left knee. He is requesting a prescription for prednisone. He states the allopurinol is making it worst. He has also staking colchicine and that has really helped in the past but has not helped this time. So he would like to try the prednisone.  He does not want an office visit but would like a call back from the provider. Will route this to flow at [facility]. Pt advised with home care, states he is already doing that.  Reason for Disposition . [1] MODERATE pain (e.g., interferes with normal activities, limping) AND [2] present > 3 days  Answer Assessment - Initial Assessment Questions 1. LOCATION and RADIATION: "Where is the pain located?"      Left knee 2. QUALITY: "What does the pain feel like?"  (e.g., sharp, dull, aching, burning)     sharp 3. SEVERITY: "How bad is the pain?" "What does it keep you from doing?"   (Scale 1-10; or mild, moderate, severe)   -  MILD (1-3): doesn't interfere with normal activities    -  MODERATE (4-7): interferes with normal activities (e.g., work or school) or awakens from sleep, limping    -  SEVERE (8-10): excruciating pain, unable to do any normal activities, unable to walk     Pain # 7 or 8 4. ONSET: "When did the pain start?" "Does it come and go, or is it there all the time?"     A week ago 5. RECURRENT: "Have you had this pain before?" If so, ask: "When, and what happened then?"     Yes, took meds for it 6. SETTING: "Has there been any recent work, exercise or other activity that involved that part of the body?"      Yes, work 7. AGGRAVATING FACTORS: "What makes the knee pain worse?" (e.g., walking, climbing stairs, running)     Pain when goes from sitting to walking 8. ASSOCIATED SYMPTOMS: "Is there any swelling or redness of the knee?"     swelling 9. OTHER SYMPTOMS: "Do you have any other symptoms?" (e.g., chest pain, difficulty breathing, fever, calf pain)     no 10. PREGNANCY: "Is there  any chance you are pregnant?" "When was your last menstrual period?"       n/a  Protocols used: KNEE PAIN-A-AH

## 2017-07-16 NOTE — Telephone Encounter (Signed)
Pt is aware and verbalized understand to make an appt if not better.

## 2017-07-16 NOTE — Telephone Encounter (Signed)
Allopurinol can worsen gout initially until uric acid levels are therapeutic.  Given that he is not responding well to colchicine, it would be reasonable to try prednisone.  Chart reviewed and it appears he has used this previously and I renewed this prior prescription.  No history of uncontrolled diabetes noted.   He should be seen if not improving with this.

## 2017-07-28 LAB — HM COLONOSCOPY

## 2017-07-29 ENCOUNTER — Encounter: Payer: Self-pay | Admitting: Nurse Practitioner

## 2017-07-29 NOTE — Progress Notes (Signed)
Abstracted result and sent to scan  

## 2017-08-24 ENCOUNTER — Ambulatory Visit (INDEPENDENT_AMBULATORY_CARE_PROVIDER_SITE_OTHER): Payer: Managed Care, Other (non HMO) | Admitting: Nurse Practitioner

## 2017-08-24 ENCOUNTER — Encounter: Payer: Self-pay | Admitting: Nurse Practitioner

## 2017-08-24 VITALS — BP 122/76 | HR 72 | Temp 98.3°F | Ht 71.0 in | Wt 262.0 lb

## 2017-08-24 DIAGNOSIS — E119 Type 2 diabetes mellitus without complications: Secondary | ICD-10-CM

## 2017-08-24 DIAGNOSIS — E782 Mixed hyperlipidemia: Secondary | ICD-10-CM

## 2017-08-24 DIAGNOSIS — Z23 Encounter for immunization: Secondary | ICD-10-CM

## 2017-08-24 DIAGNOSIS — Z1159 Encounter for screening for other viral diseases: Secondary | ICD-10-CM

## 2017-08-24 DIAGNOSIS — K219 Gastro-esophageal reflux disease without esophagitis: Secondary | ICD-10-CM

## 2017-08-24 DIAGNOSIS — I10 Essential (primary) hypertension: Secondary | ICD-10-CM | POA: Diagnosis not present

## 2017-08-24 DIAGNOSIS — Z114 Encounter for screening for human immunodeficiency virus [HIV]: Secondary | ICD-10-CM

## 2017-08-24 DIAGNOSIS — E790 Hyperuricemia without signs of inflammatory arthritis and tophaceous disease: Secondary | ICD-10-CM | POA: Diagnosis not present

## 2017-08-24 DIAGNOSIS — M109 Gout, unspecified: Secondary | ICD-10-CM

## 2017-08-24 LAB — BASIC METABOLIC PANEL
BUN: 18 mg/dL (ref 6–23)
CO2: 28 mEq/L (ref 19–32)
Calcium: 9.8 mg/dL (ref 8.4–10.5)
Chloride: 103 mEq/L (ref 96–112)
Creatinine, Ser: 1.18 mg/dL (ref 0.40–1.50)
GFR: 79.79 mL/min (ref >60.00)
Glucose, Bld: 133 mg/dL — ABNORMAL HIGH (ref 70–99)
Potassium: 4.2 mEq/L (ref 3.5–5.1)
Sodium: 138 mEq/L (ref 135–145)

## 2017-08-24 LAB — LIPID PANEL
Cholesterol: 208 mg/dL — ABNORMAL HIGH (ref 0–200)
HDL: 35.4 mg/dL — ABNORMAL LOW (ref >39.00)
LDL Cholesterol: 147 mg/dL — ABNORMAL HIGH (ref 0–99)
NonHDL: 172.16
Total CHOL/HDL Ratio: 6
Triglycerides: 125 mg/dL (ref 0.0–149.0)
VLDL: 25 mg/dL (ref 0.0–40.0)

## 2017-08-24 LAB — MICROALBUMIN / CREATININE URINE RATIO
Creatinine,U: 66.9 mg/dL
Microalb Creat Ratio: 2 mg/g (ref 0.0–30.0)
Microalb, Ur: 1.3 mg/dL (ref 0.0–1.9)

## 2017-08-24 LAB — URIC ACID: Uric Acid, Serum: 8.8 mg/dL — ABNORMAL HIGH (ref 4.0–7.8)

## 2017-08-24 LAB — HEMOGLOBIN A1C: Hgb A1c MFr Bld: 7.1 % — ABNORMAL HIGH (ref 4.6–6.5)

## 2017-08-24 NOTE — Progress Notes (Signed)
Subjective:  Patient ID: Marvin [name], male    DOB: 1950/07/07  Age: 67 y.o. MRN: [id number]  CC: Follow-up (6 mo fu/fasting/)  HPI  HTN: Controlled with amlodipine. BP Readings from Last 3 Encounters:  08/24/17 122/76  04/20/17 122/78  02/23/17 114/76   Prediabetes: hgbA1c of 6.5 6months ago.  Has made changes to diet (low carb and portion control). Stays active by walking daily.  Gout arthritis with hyperuricemia: Last gout flare 07/16/2017. Resolved with oral prednisone and colchicine.   denies any acute complain.  Reviewed [facility].  Outpatient Medications Prior to Visit  Medication Sig Dispense Refill  . albuterol (PROVENTIL HFA;VENTOLIN HFA) 108 (90 Base) MCG/ACT inhaler Inhale 2 puffs into the lungs every 2 (two) hours as needed for wheezing or shortness of breath (cough). 1 Inhaler 0  . aspirin EC 81 MG tablet Take 81 mg by mouth every morning.    . Cyclobenzaprine HCl (FLEXERIL PO) Take by mouth.    . Dentifrices (FLUORIDE TOOTHPASTE DT) Place onto teeth.    . fluticasone (FLONASE) 50 MCG/ACT nasal spray Place 2 sprays into the nose daily as needed for allergies.     Marland Kitchen ibuprofen (ADVIL,MOTRIN) 800 MG tablet Take 1 tablet (800 mg total) by mouth every 8 (eight) hours as needed. 30 tablet 0  . Spacer/Aero-Holding Chambers (AEROCHAMBER PLUS WITH MASK) inhaler Use as instructed 1 each 2  . allopurinol (ZYLOPRIM) 100 MG tablet Take 1 tablet (100 mg total) by mouth daily. 180 tablet 1  . amLODipine (NORVASC) 5 MG tablet Take 1 tablet (5 mg total) by mouth every morning. 90 tablet 1  . colchicine 0.6 MG tablet Take 2tabs once, then 1tab 1hr later. Repeat dosing the next day if no improvement. 6 tablet 0  . pantoprazole (PROTONIX) 40 MG tablet Take 1 tablet (40 mg total) by mouth daily. 90 tablet 1  . simvastatin (ZOCOR) 40 MG tablet Take 1 tablet (40 mg total) by mouth every other day. 90 tablet 1  . methocarbamol (ROBAXIN) 500 MG tablet Take 500 mg by mouth every 6  (six) hours as needed (FOR MUSCLE PAIN).    Marland Kitchen traMADol (ULTRAM) 50 MG tablet Take 1 tablet (50 mg total) by mouth every 12 (twelve) hours as needed. (Patient not taking: Reported on 08/24/2017) 4 tablet 0   No facility-administered medications prior to visit.     ROS See HPI  Objective:  BP 122/76   Pulse 72   Temp 98.3 F (36.8 C) (Oral)   Ht 5\' 11"  (1.803 m)   Wt 262 lb (118.8 kg)   SpO2 95%   BMI 36.54 kg/m   BP Readings from Last 3 Encounters:  08/24/17 122/76  04/20/17 122/78  02/23/17 114/76    Wt Readings from Last 3 Encounters:  08/24/17 262 lb (118.8 kg)  04/20/17 271 lb (122.9 kg)  02/23/17 263 lb (119.3 kg)    Physical Exam  Constitutional: He is oriented to person, place, and time. No distress.  Cardiovascular: Normal rate and regular rhythm.  Pulmonary/Chest: Effort normal and breath sounds normal.  Musculoskeletal: Normal range of motion.  Neurological: He is alert and oriented to person, place, and time.  Skin: Skin is warm and dry. No pallor.  Vitals reviewed.   Lab Results  Component Value Date   WBC 10.2 02/23/2017   HGB 15.7 02/23/2017   HCT 47.4 02/23/2017   PLT 228 02/23/2017   GLUCOSE 133 (H) 08/24/2017   CHOL 208 (H) 08/24/2017   TRIG  125.0 08/24/2017   HDL 35.40 (L) 08/24/2017   LDLCALC 147 (H) 08/24/2017   ALT 21 02/23/2017   AST 18 02/23/2017   NA 138 08/24/2017   K 4.2 08/24/2017   CL 103 08/24/2017   CREATININE 1.18 08/24/2017   BUN 18 08/24/2017   CO2 28 08/24/2017   TSH 1.48 02/23/2017   PSA 0.5 02/23/2017   INR 1.06 01/06/2011   HGBA1C 7.1 (H) 08/24/2017   MICROALBUR 1.3 08/24/2017    Dg Chest 2 View  Result Date: 04/03/2015 CLINICAL DATA:  Cough, headache and runny nose EXAM: CHEST  2 VIEW COMPARISON:  07/12/2012. FINDINGS: There is asymmetric elevation of left hemidiaphragm. There is no pleural effusion or edema identified. The lungs are clear. No airspace consolidation. No focal bone abnormality noted. IMPRESSION:  1. No acute cardiopulmonary abnormalities. 2. 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 Electronically Signed   By: Signa Kell M.D.   On: 04/03/2015 16:47    Assessment & Plan:   Marvin [name] was seen today for follow-up.  Diagnoses and all orders for this visit:  Essential hypertension -     Basic metabolic panel -     amLODipine (NORVASC) 5 MG tablet; Take 1 tablet (5 mg total) by mouth every morning.  Hyperuricemia -     Basic metabolic panel -     Uric acid -     allopurinol (ZYLOPRIM) 100 MG tablet; Take 1 tablet (100 mg total) by mouth 2 (two) times daily.  Mixed hyperlipidemia -     Lipid panel -     simvastatin (ZOCOR) 20 MG tablet; Take 1 tablet (20 mg total) by mouth daily at 6 PM.  Type 2 diabetes mellitus without complication, without long-term current use of insulin (HCC) -     Hemoglobin A1c -     Basic metabolic panel -     Microalbumin / creatinine urine ratio -     metFORMIN (GLUCOPHAGE-XR) 500 MG 24 hr tablet; Take 1 tablet (500 mg total) by mouth daily with breakfast.  Encounter for screening for human immunodeficiency virus (HIV) -     HIV antibody  Encounter for hepatitis C screening test for low risk patient -     Hepatitis C Antibody  Need for diphtheria-tetanus-pertussis (Tdap) vaccine -     Tdap vaccine greater than or equal to 7yo IM  Gastroesophageal reflux disease, esophagitis presence not specified -     pantoprazole (PROTONIX) 40 MG tablet; Take 1 tablet (40 mg total) by mouth daily.  Gouty arthritis   I have discontinued Marvin [name]. Marvin [name]'s colchicine. I have also changed his allopurinol and simvastatin. Additionally, I am having him start on metFORMIN. Lastly, I am having him maintain his fluticasone, aspirin EC, methocarbamol, ibuprofen, albuterol, aerochamber plus with mask, Cyclobenzaprine HCl (FLEXERIL PO), Dentifrices (FLUORIDE TOOTHPASTE DT), traMADol, amLODipine, and pantoprazole.  Meds ordered this encounter    Medications  . metFORMIN (GLUCOPHAGE-XR) 500 MG 24 hr tablet    Sig: Take 1 tablet (500 mg total) by mouth daily with breakfast.    Dispense:  90 tablet    Refill:  1    Order Specific Question:   Supervising Provider    Answer:   Dianne Dun [[id number]]  . allopurinol (ZYLOPRIM) 100 MG tablet    Sig: Take 1 tablet (100 mg total) by mouth 2 (two) times daily.    Dispense:  180 tablet    Refill:  1    Order Specific Question:   Supervising  Provider    Answer:   Dianne DunARON, TALIA M [[id number]]  . amLODipine (NORVASC) 5 MG tablet    Sig: Take 1 tablet (5 mg total) by mouth every morning.    Dispense:  90 tablet    Refill:  3    Order Specific Question:   Supervising Provider    Answer:   Dianne DunARON, TALIA M [[id number]]  . pantoprazole (PROTONIX) 40 MG tablet    Sig: Take 1 tablet (40 mg total) by mouth daily.    Dispense:  90 tablet    Refill:  3    Order Specific Question:   Supervising Provider    Answer:   Dianne DunARON, TALIA M [[id number]]  . simvastatin (ZOCOR) 20 MG tablet    Sig: Take 1 tablet (20 mg total) by mouth daily at 6 PM.    Dispense:  90 tablet    Refill:  3    Order Specific Question:   Supervising Provider    Answer:   Dianne DunARON, TALIA M [[id number]]    Follow-up: Return in about 6 months (around 02/24/2018) for DM and HTN, hyperlipidemia.  Alysia Pennaharlotte Nche, NP

## 2017-08-24 NOTE — Patient Instructions (Addendum)
Normal urine microalbumin. Stable renal function Increase in HgbA1c 6.5 to 7.1. Added metformin prescription Persistent Abnormal lipid panel: high LDL and low HDL. Continue simvastatin. Decreased dose to 20mg  due to DDI with amlodipine. Persistent elevated uric acid. Increased allopurinol to 100mg  BID. It is imperative that you make diet changes as discussed. F/up in 6months (fasting)  Please schedule annual diabetic eye exam. Have report faxed to me.

## 2017-08-25 DIAGNOSIS — E119 Type 2 diabetes mellitus without complications: Secondary | ICD-10-CM | POA: Insufficient documentation

## 2017-08-25 DIAGNOSIS — E790 Hyperuricemia without signs of inflammatory arthritis and tophaceous disease: Secondary | ICD-10-CM | POA: Insufficient documentation

## 2017-08-25 DIAGNOSIS — E782 Mixed hyperlipidemia: Secondary | ICD-10-CM | POA: Insufficient documentation

## 2017-08-25 LAB — HIV ANTIBODY (ROUTINE TESTING W REFLEX): HIV 1&2 Ab, 4th Generation: NONREACTIVE

## 2017-08-25 LAB — HEPATITIS C ANTIBODY
Hepatitis C Ab: NONREACTIVE
SIGNAL TO CUT-OFF: 0.05 (ref <1.00)

## 2017-08-25 MED ORDER — ALLOPURINOL 100 MG PO TABS: 100.0000 mg | ORAL_TABLET | Freq: Two times a day (BID) | ORAL | 1 refills | Status: DC

## 2017-08-25 MED ORDER — PANTOPRAZOLE SODIUM 40 MG PO TBEC: 40.0000 mg | DELAYED_RELEASE_TABLET | Freq: Every day | ORAL | 3 refills | Status: DC

## 2017-08-25 MED ORDER — METFORMIN HCL ER 500 MG PO TB24: 500.0000 mg | ORAL_TABLET | Freq: Every day | ORAL | 1 refills | Status: DC

## 2017-08-25 MED ORDER — AMLODIPINE BESYLATE 5 MG PO TABS: 5.0000 mg | ORAL_TABLET | Freq: Every morning | ORAL | 3 refills | Status: DC

## 2017-08-25 MED ORDER — SIMVASTATIN 20 MG PO TABS: 20.0000 mg | ORAL_TABLET | Freq: Every day | ORAL | 3 refills | Status: DC

## 2017-08-25 NOTE — Assessment & Plan Note (Signed)
Decreased simvastatin to 20mg  daily due to Ddi with amlodipine. LDL and HDl not at goal. Advised about need for lifestyle changes (diet and exercise) Lipid Panel     Component Value Date/Time   CHOL 208 (H) 08/24/2017 0943   TRIG 125.0 08/24/2017 0943   HDL 35.40 (L) 08/24/2017 0943   CHOLHDL 6 08/24/2017 0943   VLDL 25.0 08/24/2017 0943   LDLCALC 147 (H) 08/24/2017 0943   LDLCALC 163 (H) 02/23/2017 1538

## 2017-08-25 NOTE — Assessment & Plan Note (Signed)
Controlled with amlodipine. BP Readings from Last 3 Encounters:  08/24/17 122/76  04/20/17 122/78  02/23/17 114/76

## 2017-08-25 NOTE — Assessment & Plan Note (Signed)
Increase in HgbA1c 6.5 to 7.1. Added metformin prescription. Advised about need for life style changes (diet and exercise).

## 2017-08-25 NOTE — Assessment & Plan Note (Signed)
Persistent elevated uric acid at 8.8. Increased allopurinol to 100mg  BID. Minimize use of colchicine due to DDI with simvastatin.

## 2017-08-25 NOTE — Assessment & Plan Note (Signed)
Controlled with protonix

## 2017-09-18 LAB — HM DIABETES EYE EXAM

## 2017-10-05 ENCOUNTER — Telehealth: Payer: Self-pay | Admitting: Nurse Practitioner

## 2017-10-05 DIAGNOSIS — M109 Gout, unspecified: Secondary | ICD-10-CM

## 2017-10-05 MED ORDER — PREDNISONE 20 MG PO TABS
40.0000 mg | ORAL_TABLET | Freq: Every day | ORAL | 0 refills | Status: DC
Start: 2017-10-05 — End: 2018-02-24

## 2017-10-05 NOTE — Telephone Encounter (Signed)
Copied from CRM [phone]. Topic: Quick Communication - See Telephone Encounter >> Oct 05, 2017  9:06 AM Arlyss Gandyichardson, Aileena Iglesia N, NT wrote: CRM for notification. See Telephone encounter for: 10/05/17. Pt states he has a gout flare up and would like to see if prednisone can be sent in to his pharmacy. [facility] [address] - [place], [place] [id number] [phone] (Phone) [phone] (Fax)

## 2017-10-05 NOTE — Telephone Encounter (Signed)
Please advise, pt saw you in 08/24/17 had lab work done as well.

## 2017-10-05 NOTE — Telephone Encounter (Signed)
Pt is aware.  

## 2017-10-29 LAB — HM DIABETES EYE EXAM

## 2017-11-27 ENCOUNTER — Other Ambulatory Visit: Payer: Self-pay | Admitting: Nurse Practitioner

## 2017-11-27 MED ORDER — FLUTICASONE PROPIONATE 50 MCG/ACT NA SUSP: 2.0000 | Freq: Every day | NASAL | 1 refills | Status: DC | PRN

## 2017-11-27 NOTE — Telephone Encounter (Signed)
Copied from CRM [id number]. Topic: Quick Communication - Rx Refill/Question >> Nov 27, 2017  2:45 PM Marvin [name] wrote: Medication: fluticasone (FLONASE) 50 MCG/ACT nasal spray  Pt states [facility] has never written this Rx for him, done by another dr.  Rock Nephew requesting send to [facility] [address] [place] [address] [phone] (Phone) [phone] (Fax)

## 2018-01-21 ENCOUNTER — Other Ambulatory Visit: Payer: Self-pay | Admitting: Nurse Practitioner

## 2018-01-21 MED ORDER — FLUTICASONE PROPIONATE 50 MCG/ACT NA SUSP: 2.0000 | Freq: Every day | NASAL | 0 refills | Status: DC | PRN

## 2018-01-21 NOTE — Telephone Encounter (Signed)
Pt is requesting refill for flonase. Rx sent.

## 2018-01-25 DIAGNOSIS — M9903 Segmental and somatic dysfunction of lumbar region: Secondary | ICD-10-CM | POA: Diagnosis not present

## 2018-01-25 DIAGNOSIS — M791 Myalgia, unspecified site: Secondary | ICD-10-CM | POA: Diagnosis not present

## 2018-01-25 DIAGNOSIS — M5127 Other intervertebral disc displacement, lumbosacral region: Secondary | ICD-10-CM | POA: Diagnosis not present

## 2018-01-25 DIAGNOSIS — M9901 Segmental and somatic dysfunction of cervical region: Secondary | ICD-10-CM | POA: Diagnosis not present

## 2018-01-25 DIAGNOSIS — M5126 Other intervertebral disc displacement, lumbar region: Secondary | ICD-10-CM | POA: Diagnosis not present

## 2018-01-25 DIAGNOSIS — M545 Low back pain: Secondary | ICD-10-CM | POA: Diagnosis not present

## 2018-01-25 DIAGNOSIS — M5137 Other intervertebral disc degeneration, lumbosacral region: Secondary | ICD-10-CM | POA: Diagnosis not present

## 2018-01-25 DIAGNOSIS — M4726 Other spondylosis with radiculopathy, lumbar region: Secondary | ICD-10-CM | POA: Diagnosis not present

## 2018-01-25 DIAGNOSIS — M6240 Contracture of muscle, unspecified site: Secondary | ICD-10-CM | POA: Diagnosis not present

## 2018-01-25 DIAGNOSIS — M5136 Other intervertebral disc degeneration, lumbar region: Secondary | ICD-10-CM | POA: Diagnosis not present

## 2018-02-01 DIAGNOSIS — M9901 Segmental and somatic dysfunction of cervical region: Secondary | ICD-10-CM | POA: Diagnosis not present

## 2018-02-01 DIAGNOSIS — M5126 Other intervertebral disc displacement, lumbar region: Secondary | ICD-10-CM | POA: Diagnosis not present

## 2018-02-01 DIAGNOSIS — M791 Myalgia, unspecified site: Secondary | ICD-10-CM | POA: Diagnosis not present

## 2018-02-01 DIAGNOSIS — M4726 Other spondylosis with radiculopathy, lumbar region: Secondary | ICD-10-CM | POA: Diagnosis not present

## 2018-02-01 DIAGNOSIS — M5136 Other intervertebral disc degeneration, lumbar region: Secondary | ICD-10-CM | POA: Diagnosis not present

## 2018-02-01 DIAGNOSIS — M6240 Contracture of muscle, unspecified site: Secondary | ICD-10-CM | POA: Diagnosis not present

## 2018-02-01 DIAGNOSIS — M545 Low back pain: Secondary | ICD-10-CM | POA: Diagnosis not present

## 2018-02-01 DIAGNOSIS — M9903 Segmental and somatic dysfunction of lumbar region: Secondary | ICD-10-CM | POA: Diagnosis not present

## 2018-02-08 DIAGNOSIS — M5126 Other intervertebral disc displacement, lumbar region: Secondary | ICD-10-CM | POA: Diagnosis not present

## 2018-02-08 DIAGNOSIS — M6240 Contracture of muscle, unspecified site: Secondary | ICD-10-CM | POA: Diagnosis not present

## 2018-02-08 DIAGNOSIS — M5136 Other intervertebral disc degeneration, lumbar region: Secondary | ICD-10-CM | POA: Diagnosis not present

## 2018-02-08 DIAGNOSIS — M545 Low back pain: Secondary | ICD-10-CM | POA: Diagnosis not present

## 2018-02-08 DIAGNOSIS — M9903 Segmental and somatic dysfunction of lumbar region: Secondary | ICD-10-CM | POA: Diagnosis not present

## 2018-02-08 DIAGNOSIS — M9901 Segmental and somatic dysfunction of cervical region: Secondary | ICD-10-CM | POA: Diagnosis not present

## 2018-02-08 DIAGNOSIS — M4726 Other spondylosis with radiculopathy, lumbar region: Secondary | ICD-10-CM | POA: Diagnosis not present

## 2018-02-08 DIAGNOSIS — M791 Myalgia, unspecified site: Secondary | ICD-10-CM | POA: Diagnosis not present

## 2018-02-15 DIAGNOSIS — M545 Low back pain: Secondary | ICD-10-CM | POA: Diagnosis not present

## 2018-02-15 DIAGNOSIS — M5136 Other intervertebral disc degeneration, lumbar region: Secondary | ICD-10-CM | POA: Diagnosis not present

## 2018-02-15 DIAGNOSIS — M791 Myalgia, unspecified site: Secondary | ICD-10-CM | POA: Diagnosis not present

## 2018-02-15 DIAGNOSIS — M4726 Other spondylosis with radiculopathy, lumbar region: Secondary | ICD-10-CM | POA: Diagnosis not present

## 2018-02-15 DIAGNOSIS — M9901 Segmental and somatic dysfunction of cervical region: Secondary | ICD-10-CM | POA: Diagnosis not present

## 2018-02-15 DIAGNOSIS — M6240 Contracture of muscle, unspecified site: Secondary | ICD-10-CM | POA: Diagnosis not present

## 2018-02-15 DIAGNOSIS — M5126 Other intervertebral disc displacement, lumbar region: Secondary | ICD-10-CM | POA: Diagnosis not present

## 2018-02-15 DIAGNOSIS — M9903 Segmental and somatic dysfunction of lumbar region: Secondary | ICD-10-CM | POA: Diagnosis not present

## 2018-02-17 ENCOUNTER — Other Ambulatory Visit: Payer: Self-pay | Admitting: Nurse Practitioner

## 2018-02-17 DIAGNOSIS — E119 Type 2 diabetes mellitus without complications: Secondary | ICD-10-CM

## 2018-02-22 DIAGNOSIS — M4726 Other spondylosis with radiculopathy, lumbar region: Secondary | ICD-10-CM | POA: Diagnosis not present

## 2018-02-22 DIAGNOSIS — M6240 Contracture of muscle, unspecified site: Secondary | ICD-10-CM | POA: Diagnosis not present

## 2018-02-22 DIAGNOSIS — M9903 Segmental and somatic dysfunction of lumbar region: Secondary | ICD-10-CM | POA: Diagnosis not present

## 2018-02-22 DIAGNOSIS — M5126 Other intervertebral disc displacement, lumbar region: Secondary | ICD-10-CM | POA: Diagnosis not present

## 2018-02-22 DIAGNOSIS — M791 Myalgia, unspecified site: Secondary | ICD-10-CM | POA: Diagnosis not present

## 2018-02-22 DIAGNOSIS — M5136 Other intervertebral disc degeneration, lumbar region: Secondary | ICD-10-CM | POA: Diagnosis not present

## 2018-02-22 DIAGNOSIS — M9901 Segmental and somatic dysfunction of cervical region: Secondary | ICD-10-CM | POA: Diagnosis not present

## 2018-02-24 ENCOUNTER — Other Ambulatory Visit: Payer: Medicare HMO

## 2018-02-24 ENCOUNTER — Encounter: Payer: Self-pay | Admitting: Nurse Practitioner

## 2018-02-24 ENCOUNTER — Ambulatory Visit (INDEPENDENT_AMBULATORY_CARE_PROVIDER_SITE_OTHER): Payer: Medicare HMO | Admitting: Nurse Practitioner

## 2018-02-24 VITALS — BP 144/96 | HR 76 | Temp 98.0°F | Ht 71.0 in | Wt 261.2 lb

## 2018-02-24 DIAGNOSIS — Z23 Encounter for immunization: Secondary | ICD-10-CM | POA: Diagnosis not present

## 2018-02-24 DIAGNOSIS — E119 Type 2 diabetes mellitus without complications: Secondary | ICD-10-CM

## 2018-02-24 DIAGNOSIS — M109 Gout, unspecified: Secondary | ICD-10-CM

## 2018-02-24 DIAGNOSIS — I10 Essential (primary) hypertension: Secondary | ICD-10-CM

## 2018-02-24 DIAGNOSIS — E790 Hyperuricemia without signs of inflammatory arthritis and tophaceous disease: Secondary | ICD-10-CM | POA: Diagnosis not present

## 2018-02-24 LAB — URIC ACID: Uric Acid, Serum: 5.6 mg/dL (ref 4.0–7.8)

## 2018-02-24 LAB — BASIC METABOLIC PANEL
BUN: 12 mg/dL (ref 6–23)
CO2: 22 mEq/L (ref 19–32)
Calcium: 9.3 mg/dL (ref 8.4–10.5)
Chloride: 110 mEq/L (ref 96–112)
Creatinine, Ser: 0.97 mg/dL (ref 0.40–1.50)
GFR: 99.88 mL/min (ref >60.00)
Glucose, Bld: 98 mg/dL (ref 70–99)
Potassium: 4 mEq/L (ref 3.5–5.1)
Sodium: 142 mEq/L (ref 135–145)

## 2018-02-24 NOTE — Progress Notes (Signed)
Subjective:  Patient ID: Marvin [name], male    DOB: 17-Aug-1950  Age: 68 y.o. MRN: [id number]  CC: Follow-up (F/u DM, HTN / not fasting (ate 4 hours) no complaints/pneumonia shot?)  HPI  DM: Last HgbA1c of 7.1 No adverse effects with metformin. eye exam: done 10/2017, normal per patient. Need report No neuropathy. Has not made changes to diet Walks 22,000steps per day.  HTN: Elevated today. Takes amlodipine as prescribed.  Reviewed past Medical, Social and Family history today.  Outpatient Medications Prior to Visit  Medication Sig Dispense Refill  . albuterol (PROVENTIL HFA;VENTOLIN HFA) 108 (90 Base) MCG/ACT inhaler Inhale 2 puffs into the lungs every 2 (two) hours as needed for wheezing or shortness of breath (cough). 1 Inhaler 0  . allopurinol (ZYLOPRIM) 100 MG tablet Take 1 tablet (100 mg total) by mouth 2 (two) times daily. 180 tablet 1  . amLODipine (NORVASC) 5 MG tablet Take 1 tablet (5 mg total) by mouth every morning. 90 tablet 3  . aspirin EC 81 MG tablet Take 81 mg by mouth every morning.    . Cyclobenzaprine HCl (FLEXERIL PO) Take by mouth.    . Dentifrices (FLUORIDE TOOTHPASTE DT) Place onto teeth.    . fluticasone (FLONASE) 50 MCG/ACT nasal spray Place 2 sprays into both nostrils daily as needed for allergies. 16 g 0  . ibuprofen (ADVIL,MOTRIN) 800 MG tablet Take 1 tablet (800 mg total) by mouth every 8 (eight) hours as needed. 30 tablet 0  . metFORMIN (GLUCOPHAGE-XR) 500 MG 24 hr tablet TAKE ONE TABLET BY MOUTH DAILY WITH BREAKFAST 90 tablet 1  . methocarbamol (ROBAXIN) 500 MG tablet Take 500 mg by mouth every 6 (six) hours as needed (FOR MUSCLE PAIN).    Marland Kitchen pantoprazole (PROTONIX) 40 MG tablet Take 1 tablet (40 mg total) by mouth daily. 90 tablet 3  . simvastatin (ZOCOR) 20 MG tablet Take 1 tablet (20 mg total) by mouth daily at 6 PM. 90 tablet 3  . Spacer/Aero-Holding Chambers (AEROCHAMBER PLUS WITH MASK) inhaler Use as instructed 1 each 2  . predniSONE  (DELTASONE) 20 MG tablet Take 2 tablets (40 mg total) by mouth daily with breakfast. (Patient not taking: Reported on 02/24/2018) 10 tablet 0  . traMADol (ULTRAM) 50 MG tablet Take 1 tablet (50 mg total) by mouth every 12 (twelve) hours as needed. (Patient not taking: Reported on 08/24/2017) 4 tablet 0   No facility-administered medications prior to visit.     ROS See HPI  Objective:  BP (!) 144/96   Pulse 76   Temp 98 F (36.7 C) (Oral)   Ht 5\' 11"  (1.803 m)   Wt 261 lb 3.2 oz (118.5 kg)   SpO2 94%   BMI 36.43 kg/m   BP Readings from Last 3 Encounters:  02/24/18 (!) 144/96  08/24/17 122/76  04/20/17 122/78    Wt Readings from Last 3 Encounters:  02/24/18 261 lb 3.2 oz (118.5 kg)  08/24/17 262 lb (118.8 kg)  04/20/17 271 lb (122.9 kg)    Physical Exam Vitals signs reviewed.  Constitutional:      Appearance: He is obese.  Cardiovascular:     Rate and Rhythm: Normal rate and regular rhythm.     Pulses: Normal pulses.     Heart sounds: Normal heart sounds.  Musculoskeletal:     Right lower leg: No edema.     Left lower leg: No edema.  Skin:    Comments: Normal diabetic foot exam  Neurological:  Mental Status: He is alert and oriented to person, place, and time.  Psychiatric:        Mood and Affect: Mood normal.        Behavior: Behavior normal.     Lab Results  Component Value Date   WBC 10.2 02/23/2017   HGB 15.7 02/23/2017   HCT 47.4 02/23/2017   PLT 228 02/23/2017   GLUCOSE 133 (H) 08/24/2017   CHOL 208 (H) 08/24/2017   TRIG 125.0 08/24/2017   HDL 35.40 (L) 08/24/2017   LDLCALC 147 (H) 08/24/2017   ALT 21 02/23/2017   AST 18 02/23/2017   NA 138 08/24/2017   K 4.2 08/24/2017   CL 103 08/24/2017   CREATININE 1.18 08/24/2017   BUN 18 08/24/2017   CO2 28 08/24/2017   TSH 1.48 02/23/2017   PSA 0.5 02/23/2017   INR 1.06 01/06/2011   HGBA1C 7.1 (H) 08/24/2017   MICROALBUR 1.3 08/24/2017    Dg Chest 2 View  Result Date: 04/03/2015 CLINICAL DATA:   Cough, headache and runny nose EXAM: CHEST  2 VIEW COMPARISON:  07/12/2012. FINDINGS: There is asymmetric elevation of left hemidiaphragm. There is no pleural effusion or edema identified. The lungs are clear. No airspace consolidation. No focal bone abnormality noted. IMPRESSION: 1. No acute cardiopulmonary abnormalities. 2. 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 Electronically Signed   By: Signa Kell M.D.   On: 04/03/2015 16:47    Assessment & Plan:   Jayleon was seen today for follow-up.  Diagnoses and all orders for this visit:  Essential hypertension -     Basic metabolic panel  Hyperuricemia -     Uric acid -     Basic metabolic panel  Gouty arthritis -     Uric acid  Type 2 diabetes mellitus without complication, without long-term current use of insulin (HCC) -     Hemoglobin A1c -     Basic metabolic panel  Need for pneumococcal vaccination -     Pneumococcal conjugate vaccine 13-valent   I have discontinued Alfonse Alpers. Reffett's traMADol and predniSONE. I am also having him maintain his aspirin EC, methocarbamol, ibuprofen, albuterol, aerochamber plus with mask, Cyclobenzaprine HCl (FLEXERIL PO), Dentifrices (FLUORIDE TOOTHPASTE DT), allopurinol, amLODipine, pantoprazole, simvastatin, fluticasone, and metFORMIN.  No orders of the defined types were placed in this encounter.   Problem List Items Addressed This Visit      Cardiovascular and Mediastinum   Essential hypertension - Primary   Relevant Orders   Basic metabolic panel     Endocrine   Type 2 diabetes mellitus without complication, without long-term current use of insulin (HCC)   Relevant Orders   Hemoglobin A1c   Basic metabolic panel     Musculoskeletal and Integument   Gouty arthritis   Relevant Orders   Uric acid     Other   Hyperuricemia   Relevant Orders   Uric acid   Basic metabolic panel    Other Visit Diagnoses    Need for pneumococcal vaccination       Relevant  Orders   Pneumococcal conjugate vaccine 13-valent (Completed)       Follow-up: Return in about 3 months (around 05/26/2018) for DM and HTN.  Alysia Penna, NP

## 2018-02-24 NOTE — Patient Instructions (Addendum)
Sign medical release to get records from ophthalmology.  Go to lab for blood draw.   Diabetes Mellitus and Nutrition, Adult When you have diabetes (diabetes mellitus), it is very important to have healthy eating habits because your blood sugar (glucose) levels are greatly affected by what you eat and drink. Eating healthy foods in the appropriate amounts, at about the same times every day, can help you:  Control your blood glucose.  Lower your risk of heart disease.  Improve your blood pressure.  Reach or maintain a healthy weight. Every person with diabetes is different, and each person has different needs for a meal plan. Your health care provider may recommend that you work with a diet and nutrition specialist (dietitian) to make a meal plan that is best for you. Your meal plan may vary depending on factors such as:  The calories you need.  The medicines you take.  Your weight.  Your blood glucose, blood pressure, and cholesterol levels.  Your activity level.  Other health conditions you have, such as heart or kidney disease. How do carbohydrates affect me? Carbohydrates, also called carbs, affect your blood glucose level more than any other type of food. Eating carbs naturally raises the amount of glucose in your blood. Carb counting is a method for keeping track of how many carbs you eat. Counting carbs is important to keep your blood glucose at a healthy level, especially if you use insulin or take certain oral diabetes medicines. It is important to know how many carbs you can safely have in each meal. This is different for every person. Your dietitian can help you calculate how many carbs you should have at each meal and for each snack. Foods that contain carbs include:  Bread, cereal, rice, pasta, and crackers.  Potatoes and corn.  Peas, beans, and lentils.  Milk and yogurt.  Fruit and juice.  Desserts, such as cakes, cookies, ice cream, and candy. How does alcohol  affect me? Alcohol can cause a sudden decrease in blood glucose (hypoglycemia), especially if you use insulin or take certain oral diabetes medicines. Hypoglycemia can be a life-threatening condition. Symptoms of hypoglycemia (sleepiness, dizziness, and confusion) are similar to symptoms of having too much alcohol. If your health care provider says that alcohol is safe for you, follow these guidelines:  Limit alcohol intake to no more than 1 drink per day for nonpregnant women and 2 drinks per day for men. One drink equals 12 oz of beer, 5 oz of wine, or 1 oz of hard liquor.  Do not drink on an empty stomach.  Keep yourself hydrated with water, diet soda, or unsweetened iced tea.  Keep in mind that regular soda, juice, and other mixers may contain a lot of sugar and must be counted as carbs. What are tips for following this plan?  Reading food labels  Start by checking the serving size on the "Nutrition Facts" label of packaged foods and drinks. The amount of calories, carbs, fats, and other nutrients listed on the label is based on one serving of the item. Many items contain more than one serving per package.  Check the total grams (g) of carbs in one serving. You can calculate the number of servings of carbs in one serving by dividing the total carbs by 15. For example, if a food has 30 g of total carbs, it would be equal to 2 servings of carbs.  Check the number of grams (g) of saturated and trans fats in one  serving. Choose foods that have low or no amount of these fats.  Check the number of milligrams (mg) of salt (sodium) in one serving. Most people should limit total sodium intake to less than 2,300 mg per day.  Always check the nutrition information of foods labeled as "low-fat" or "nonfat". These foods may be higher in added sugar or refined carbs and should be avoided.  Talk to your dietitian to identify your daily goals for nutrients listed on the label. Shopping  Avoid buying  canned, premade, or processed foods. These foods tend to be high in fat, sodium, and added sugar.  Shop around the outside edge of the grocery store. This includes fresh fruits and vegetables, bulk grains, fresh meats, and fresh dairy. Cooking  Use low-heat cooking methods, such as baking, instead of high-heat cooking methods like deep frying.  Cook using healthy oils, such as olive, canola, or sunflower oil.  Avoid cooking with butter, cream, or high-fat meats. Meal planning  Eat meals and snacks regularly, preferably at the same times every day. Avoid going long periods of time without eating.  Eat foods high in fiber, such as fresh fruits, vegetables, beans, and whole grains. Talk to your dietitian about how many servings of carbs you can eat at each meal.  Eat 4-6 ounces (oz) of lean protein each day, such as lean meat, chicken, fish, eggs, or tofu. One oz of lean protein is equal to: ? 1 oz of meat, chicken, or fish. ? 1 egg. ?  cup of tofu.  Eat some foods each day that contain healthy fats, such as avocado, nuts, seeds, and fish. Lifestyle  Check your blood glucose regularly.  Exercise regularly as told by your health care provider. This may include: ? 150 minutes of moderate-intensity or vigorous-intensity exercise each week. This could be brisk walking, biking, or water aerobics. ? Stretching and doing strength exercises, such as yoga or weightlifting, at least 2 times a week.  Take medicines as told by your health care provider.  Do not use any products that contain nicotine or tobacco, such as cigarettes and e-cigarettes. If you need help quitting, ask your health care provider.  Work with a Social worker or diabetes educator to identify strategies to manage stress and any emotional and social challenges. Questions to ask a health care provider  Do I need to meet with a diabetes educator?  Do I need to meet with a dietitian?  What number can I call if I have  questions?  When are the best times to check my blood glucose? Where to find more information:  American Diabetes Association: diabetes.org  Academy of Nutrition and Dietetics: www.eatright.[employer] of Diabetes and Digestive and Kidney Diseases (NIH): [employer] Summary  A healthy meal plan will help you control your blood glucose and maintain a healthy lifestyle.  Working with a diet and nutrition specialist (dietitian) can help you make a meal plan that is best for you.  Keep in mind that carbohydrates (carbs) and alcohol have immediate effects on your blood glucose levels. It is important to count carbs and to use alcohol carefully. This information is not intended to replace advice given to you by your health care provider. Make sure you discuss any questions you have with your health care provider. Document Released: 10/31/2004 Document Revised: 09/03/2016 Document Reviewed: 03/10/2016 Elsevier Interactive Patient Education  2019 [employer].

## 2018-02-25 ENCOUNTER — Encounter: Payer: Self-pay | Admitting: Nurse Practitioner

## 2018-02-25 LAB — HEMOGLOBIN A1C
Hgb A1c MFr Bld: 6.7 % of total Hgb — ABNORMAL HIGH (ref <5.7)
Mean Plasma Glucose: 146 (calc)
eAG (mmol/L): 8.1 (calc)

## 2018-03-01 DIAGNOSIS — M9901 Segmental and somatic dysfunction of cervical region: Secondary | ICD-10-CM | POA: Diagnosis not present

## 2018-03-01 DIAGNOSIS — M5136 Other intervertebral disc degeneration, lumbar region: Secondary | ICD-10-CM | POA: Diagnosis not present

## 2018-03-01 DIAGNOSIS — M791 Myalgia, unspecified site: Secondary | ICD-10-CM | POA: Diagnosis not present

## 2018-03-01 DIAGNOSIS — M5126 Other intervertebral disc displacement, lumbar region: Secondary | ICD-10-CM | POA: Diagnosis not present

## 2018-03-01 DIAGNOSIS — M6240 Contracture of muscle, unspecified site: Secondary | ICD-10-CM | POA: Diagnosis not present

## 2018-03-01 DIAGNOSIS — M9903 Segmental and somatic dysfunction of lumbar region: Secondary | ICD-10-CM | POA: Diagnosis not present

## 2018-03-01 DIAGNOSIS — M4726 Other spondylosis with radiculopathy, lumbar region: Secondary | ICD-10-CM | POA: Diagnosis not present

## 2018-03-08 DIAGNOSIS — M5136 Other intervertebral disc degeneration, lumbar region: Secondary | ICD-10-CM | POA: Diagnosis not present

## 2018-03-08 DIAGNOSIS — M9903 Segmental and somatic dysfunction of lumbar region: Secondary | ICD-10-CM | POA: Diagnosis not present

## 2018-03-08 DIAGNOSIS — M9901 Segmental and somatic dysfunction of cervical region: Secondary | ICD-10-CM | POA: Diagnosis not present

## 2018-03-08 DIAGNOSIS — M4726 Other spondylosis with radiculopathy, lumbar region: Secondary | ICD-10-CM | POA: Diagnosis not present

## 2018-03-08 DIAGNOSIS — M791 Myalgia, unspecified site: Secondary | ICD-10-CM | POA: Diagnosis not present

## 2018-03-08 DIAGNOSIS — M6240 Contracture of muscle, unspecified site: Secondary | ICD-10-CM | POA: Diagnosis not present

## 2018-03-08 DIAGNOSIS — M5126 Other intervertebral disc displacement, lumbar region: Secondary | ICD-10-CM | POA: Diagnosis not present

## 2018-03-11 ENCOUNTER — Other Ambulatory Visit: Payer: Self-pay | Admitting: Nurse Practitioner

## 2018-03-11 MED ORDER — FLUTICASONE PROPIONATE 50 MCG/ACT NA SUSP: 2.0000 | Freq: Every day | NASAL | 0 refills | Status: DC | PRN

## 2018-03-11 NOTE — Telephone Encounter (Signed)
Copied from CRM [id number]. Topic: Quick Communication - Rx Refill/Question >> Mar 11, 2018  3:53 PM Jaquita Rector A wrote: Medication: fluticasone (FLONASE) 50 MCG/ACT nasal spray  Has the patient contacted their pharmacy? Yes.   (Agent: If no, request that the patient contact the pharmacy for the refill.) (Agent: If yes, when and what did the pharmacy advise?)  Preferred Pharmacy (with phone number or street name): [facility] [address], [place] - [address] [place] Maroon [phone] (Phone) [phone] (Fax)    Agent: Please be advised that RX refills may take up to 3 business days. We ask that you follow-up with your pharmacy.

## 2018-03-15 DIAGNOSIS — M9903 Segmental and somatic dysfunction of lumbar region: Secondary | ICD-10-CM | POA: Diagnosis not present

## 2018-03-15 DIAGNOSIS — M9901 Segmental and somatic dysfunction of cervical region: Secondary | ICD-10-CM | POA: Diagnosis not present

## 2018-03-15 DIAGNOSIS — M5126 Other intervertebral disc displacement, lumbar region: Secondary | ICD-10-CM | POA: Diagnosis not present

## 2018-03-15 DIAGNOSIS — M6240 Contracture of muscle, unspecified site: Secondary | ICD-10-CM | POA: Diagnosis not present

## 2018-03-15 DIAGNOSIS — M5136 Other intervertebral disc degeneration, lumbar region: Secondary | ICD-10-CM | POA: Diagnosis not present

## 2018-03-15 DIAGNOSIS — M791 Myalgia, unspecified site: Secondary | ICD-10-CM | POA: Diagnosis not present

## 2018-03-15 DIAGNOSIS — M4726 Other spondylosis with radiculopathy, lumbar region: Secondary | ICD-10-CM | POA: Diagnosis not present

## 2018-03-19 ENCOUNTER — Other Ambulatory Visit: Payer: Self-pay | Admitting: Nurse Practitioner

## 2018-03-19 ENCOUNTER — Telehealth: Payer: Self-pay

## 2018-03-19 DIAGNOSIS — E790 Hyperuricemia without signs of inflammatory arthritis and tophaceous disease: Secondary | ICD-10-CM

## 2018-03-19 NOTE — Telephone Encounter (Signed)
Allopurinol request sent to pharmacy.

## 2018-03-22 DIAGNOSIS — M6240 Contracture of muscle, unspecified site: Secondary | ICD-10-CM | POA: Diagnosis not present

## 2018-03-22 DIAGNOSIS — M9903 Segmental and somatic dysfunction of lumbar region: Secondary | ICD-10-CM | POA: Diagnosis not present

## 2018-03-22 DIAGNOSIS — M5126 Other intervertebral disc displacement, lumbar region: Secondary | ICD-10-CM | POA: Diagnosis not present

## 2018-03-22 DIAGNOSIS — M4726 Other spondylosis with radiculopathy, lumbar region: Secondary | ICD-10-CM | POA: Diagnosis not present

## 2018-03-22 DIAGNOSIS — M5136 Other intervertebral disc degeneration, lumbar region: Secondary | ICD-10-CM | POA: Diagnosis not present

## 2018-03-22 DIAGNOSIS — M9901 Segmental and somatic dysfunction of cervical region: Secondary | ICD-10-CM | POA: Diagnosis not present

## 2018-03-22 DIAGNOSIS — M791 Myalgia, unspecified site: Secondary | ICD-10-CM | POA: Diagnosis not present

## 2018-03-29 DIAGNOSIS — M9903 Segmental and somatic dysfunction of lumbar region: Secondary | ICD-10-CM | POA: Diagnosis not present

## 2018-03-29 DIAGNOSIS — M4726 Other spondylosis with radiculopathy, lumbar region: Secondary | ICD-10-CM | POA: Diagnosis not present

## 2018-03-29 DIAGNOSIS — M6240 Contracture of muscle, unspecified site: Secondary | ICD-10-CM | POA: Diagnosis not present

## 2018-03-29 DIAGNOSIS — M791 Myalgia, unspecified site: Secondary | ICD-10-CM | POA: Diagnosis not present

## 2018-03-29 DIAGNOSIS — M9901 Segmental and somatic dysfunction of cervical region: Secondary | ICD-10-CM | POA: Diagnosis not present

## 2018-03-29 DIAGNOSIS — M5136 Other intervertebral disc degeneration, lumbar region: Secondary | ICD-10-CM | POA: Diagnosis not present

## 2018-03-29 DIAGNOSIS — M5126 Other intervertebral disc displacement, lumbar region: Secondary | ICD-10-CM | POA: Diagnosis not present

## 2018-04-05 DIAGNOSIS — M9903 Segmental and somatic dysfunction of lumbar region: Secondary | ICD-10-CM | POA: Diagnosis not present

## 2018-04-05 DIAGNOSIS — M9901 Segmental and somatic dysfunction of cervical region: Secondary | ICD-10-CM | POA: Diagnosis not present

## 2018-04-05 DIAGNOSIS — M5126 Other intervertebral disc displacement, lumbar region: Secondary | ICD-10-CM | POA: Diagnosis not present

## 2018-04-05 DIAGNOSIS — M6240 Contracture of muscle, unspecified site: Secondary | ICD-10-CM | POA: Diagnosis not present

## 2018-04-05 DIAGNOSIS — M5136 Other intervertebral disc degeneration, lumbar region: Secondary | ICD-10-CM | POA: Diagnosis not present

## 2018-04-05 DIAGNOSIS — M791 Myalgia, unspecified site: Secondary | ICD-10-CM | POA: Diagnosis not present

## 2018-04-05 DIAGNOSIS — M4726 Other spondylosis with radiculopathy, lumbar region: Secondary | ICD-10-CM | POA: Diagnosis not present

## 2018-04-12 DIAGNOSIS — M9901 Segmental and somatic dysfunction of cervical region: Secondary | ICD-10-CM | POA: Diagnosis not present

## 2018-04-12 DIAGNOSIS — M6240 Contracture of muscle, unspecified site: Secondary | ICD-10-CM | POA: Diagnosis not present

## 2018-04-12 DIAGNOSIS — M5126 Other intervertebral disc displacement, lumbar region: Secondary | ICD-10-CM | POA: Diagnosis not present

## 2018-04-12 DIAGNOSIS — M9903 Segmental and somatic dysfunction of lumbar region: Secondary | ICD-10-CM | POA: Diagnosis not present

## 2018-04-12 DIAGNOSIS — M5136 Other intervertebral disc degeneration, lumbar region: Secondary | ICD-10-CM | POA: Diagnosis not present

## 2018-04-12 DIAGNOSIS — M791 Myalgia, unspecified site: Secondary | ICD-10-CM | POA: Diagnosis not present

## 2018-04-12 DIAGNOSIS — M4726 Other spondylosis with radiculopathy, lumbar region: Secondary | ICD-10-CM | POA: Diagnosis not present

## 2018-04-26 ENCOUNTER — Telehealth: Payer: Self-pay | Admitting: Nurse Practitioner

## 2018-04-26 MED ORDER — FLUTICASONE PROPIONATE 50 MCG/ACT NA SUSP: 2.0000 | Freq: Every day | NASAL | 0 refills | Status: DC | PRN

## 2018-04-26 NOTE — Telephone Encounter (Signed)
Rx sent 

## 2018-04-26 NOTE — Telephone Encounter (Signed)
Copied from CRM [id number]. Topic: Quick Communication - Rx Refill/Question >> Apr 26, 2018  8:58 AM Jens Som A wrote: Medication: fluticasone (FLONASE) 50 MCG/ACT nasal spray [[id number]]   Has the patient contacted their pharmacy? Yes (Agent: If no, request that the patient contact the pharmacy for the refill.) (Agent: If yes, when and what did the pharmacy advise?)  Preferred Pharmacy (with phone number or street name): [facility] [address] [place] - [address] [place] [phone] (Phone) [phone] (Fax)    Agent: Please be advised that RX refills may take up to 3 business days. We ask that you follow-up with your pharmacy.

## 2018-05-24 ENCOUNTER — Ambulatory Visit: Payer: Medicare HMO | Admitting: Nurse Practitioner

## 2018-06-09 ENCOUNTER — Other Ambulatory Visit: Payer: Self-pay | Admitting: Nurse Practitioner

## 2018-06-09 MED ORDER — FLUTICASONE PROPIONATE 50 MCG/ACT NA SUSP: 2.0000 | Freq: Every day | NASAL | 0 refills | Status: DC | PRN

## 2018-06-09 NOTE — Telephone Encounter (Signed)
Requested Prescriptions  Pending Prescriptions Disp Refills  . fluticasone (FLONASE) 50 MCG/ACT nasal spray 16 g 0    Sig: Place 2 sprays into both nostrils daily as needed for allergies.     Ear, Nose, and Throat: Nasal Preparations - Corticosteroids Passed - 06/09/2018 10:55 AM      Passed - Valid encounter within last 12 months    Recent Outpatient Visits          3 months ago Essential hypertension   [facility], Bonna Gains, NP   9 months ago Essential hypertension   [facility], Bonna Gains, NP   1 year ago Acute gout of left ankle, unspecified cause   [facility], Bonna Gains, NP   1 year ago Encounter for preventative adult health care exam with abnormal findings   [facility], Bonna Gains, NP

## 2018-06-21 ENCOUNTER — Encounter (HOSPITAL_COMMUNITY): Payer: Self-pay

## 2018-06-21 ENCOUNTER — Other Ambulatory Visit: Payer: Self-pay

## 2018-06-21 ENCOUNTER — Ambulatory Visit (HOSPITAL_COMMUNITY)
Admission: EM | Admit: 2018-06-21 | Discharge: 2018-06-21 | Disposition: A | Discharge status: Home / Self Care | Payer: Medicare HMO | Attending: Family Medicine | Admitting: Family Medicine

## 2018-06-21 ENCOUNTER — Ambulatory Visit (INDEPENDENT_AMBULATORY_CARE_PROVIDER_SITE_OTHER): Payer: Medicare HMO

## 2018-06-21 DIAGNOSIS — S92502A Displaced unspecified fracture of left lesser toe(s), initial encounter for closed fracture: Secondary | ICD-10-CM | POA: Diagnosis not present

## 2018-06-21 DIAGNOSIS — W2209XA Striking against other stationary object, initial encounter: Secondary | ICD-10-CM | POA: Diagnosis not present

## 2018-06-21 DIAGNOSIS — S92512A Displaced fracture of proximal phalanx of left lesser toe(s), initial encounter for closed fracture: Secondary | ICD-10-CM | POA: Diagnosis not present

## 2018-06-21 MED ORDER — IBUPROFEN 800 MG PO TABS: 800.0000 mg | ORAL_TABLET | Freq: Three times a day (TID) | ORAL | 0 refills | Status: DC | PRN

## 2018-06-21 NOTE — ED Triage Notes (Signed)
Patient presents to Urgent Care with complaints of little toe pain on his left foot since he banged it into a step next to his fireplace. Obvious deformity noted. Patient reports he took 1500 mg acetaminophen at 0900 today.

## 2018-06-21 NOTE — ED Provider Notes (Signed)
[facility]    CSN: [id number] Arrival date & time: 06/21/18  1850     History   Chief Complaint Chief Complaint  Patient presents with  . Toe Pain    HPI Marvin [name] is a 68 y.o. male.   HPI Caught left fifth toe on a brick while walking, has immediate pain and pop.  Deformity.  Thinks he has a broken toe. Is otherwise compliant with his medical care and feels like he is "in good shape" History of alcohol and drug abuse.  Specifically tells me he does not want pain medicine  Past Medical History:  Diagnosis Date  . Alcohol abuse   . Allergy   . Arthritis   . Drug abuse (HCC)   . Gout   . Hernia   . Hypercholesteremia   . Hypertension   . Wears dentures    bottom front    Patient Active Problem List   Diagnosis Date Noted  . Type 2 diabetes mellitus without complication, without long-term current use of insulin (HCC) 08/25/2017  . Hyperuricemia 08/25/2017  . Mixed hyperlipidemia 08/25/2017  . Gastroesophageal reflux disease 02/23/2017  . Gouty arthritis 02/23/2017  . Essential hypertension 02/23/2017    Past Surgical History:  Procedure Laterality Date  . HERNIA REPAIR    . REPLACEMENT TOTAL KNEE     partial  . SHOULDER SURGERY     right       Home Medications    Prior to Admission medications   Medication Sig Start Date End Date Taking? Authorizing Provider  albuterol (PROVENTIL HFA;VENTOLIN HFA) 108 (90 Base) MCG/ACT inhaler Inhale 2 puffs into the lungs every 2 (two) hours as needed for wheezing or shortness of breath (cough). 04/03/15   Doug SouJacubowitz, Sam, MD  allopurinol (ZYLOPRIM) 100 MG tablet TAKE ONE TABLET BY MOUTH TWICE A DAY 03/19/18   [facility], Bonna Gainsharlotte Lum, NP  amLODipine (NORVASC) 5 MG tablet Take 1 tablet (5 mg total) by mouth every morning. 08/25/17   Nche, Bonna Gainsharlotte Lum, NP  aspirin EC 81 MG tablet Take 81 mg by mouth every morning.    [provider]  Cyclobenzaprine HCl (FLEXERIL PO) Take by mouth.    [provider]  Dentifrices (FLUORIDE TOOTHPASTE DT) Place onto teeth.    [provider]  fluticasone (FLONASE) 50 MCG/ACT nasal spray Place 2 sprays into both nostrils daily as needed for allergies. 06/09/18   N[facility], Bonna Gainsharlotte Lum, NP  ibuprofen (ADVIL) 800 MG tablet Take 1 tablet (800 mg total) by mouth every 8 (eight) hours as needed. 06/21/18   Eustace MooreNelson, Danija Gosa Sue, MD  metFORMIN (GLUCOPHAGE-XR) 500 MG 24 hr tablet TAKE ONE TABLET BY MOUTH DAILY WITH BREAKFAST 02/17/18   Nche, Bonna Gainsharlotte Lum, NP  methocarbamol (ROBAXIN) 500 MG tablet Take 500 mg by mouth every 6 (six) hours as needed (FOR MUSCLE PAIN).    [provider]  pantoprazole (PROTONIX) 40 MG tablet Take 1 tablet (40 mg total) by mouth daily. 08/25/17   Nche, Bonna Gainsharlotte Lum, NP  simvastatin (ZOCOR) 20 MG tablet Take 1 tablet (20 mg total) by mouth daily at 6 PM. 08/25/17   Nche, Bonna Gainsharlotte Lum, NP  Spacer/Aero-Holding Chambers (AEROCHAMBER PLUS WITH MASK) inhaler Use as instructed 04/03/15   Doug SouJacubowitz, Sam, MD    Family History Family History  Problem Relation Age of Onset  . Cancer Brother        bone cancer  . Cancer Maternal Aunt        breast cancer  .  Cancer Maternal Uncle        lukemia  . Cancer Cousin        unknown  . Cancer Maternal Uncle        lukemia  . Cancer Paternal Uncle        prostate  . Hearing loss Maternal Grandfather 79    Social History Social History   Tobacco Use  . Smoking status: Former Smoker    Types: Cigarettes  . Smokeless tobacco: Former Neurosurgeon    Types: Chew  Substance Use Topics  . Alcohol use: No  . Drug use: No     Allergies   Buprenorphine hcl and Morphine and related   Review of Systems Review of Systems  Constitutional: Negative for chills and fever.  HENT: Negative for ear pain and sore throat.   Eyes: Negative for pain and visual disturbance.  Respiratory: Negative for cough and shortness of breath.   Cardiovascular: Negative for chest pain and palpitations.   Gastrointestinal: Negative for abdominal pain and vomiting.  Genitourinary: Negative for dysuria and hematuria.  Musculoskeletal: Positive for gait problem. Negative for arthralgias and back pain.  Skin: Negative for color change and rash.  Neurological: Negative for seizures and syncope.  All other systems reviewed and are negative.    Physical Exam Triage Vital Signs ED Triage Vitals  Enc Vitals Group     BP 06/21/18 1921 (!) 175/98     Pulse Rate 06/21/18 1921 94     Resp 06/21/18 1921 19     Temp 06/21/18 1921 98.6 F (37 C)     Temp Source 06/21/18 1921 Oral     SpO2 06/21/18 1921 99 %     Weight --      Height --      Head Circumference --      Peak Flow --      Pain Score 06/21/18 1918 8     Pain Loc --      Pain Edu? --      Excl. in GC? --    No data found.  Updated Vital Signs BP (!) 175/98 (BP Location: Left Arm)   Pulse 94   Temp 98.6 F (37 C) (Oral)   Resp 19   SpO2 99%   Visual Acuity Right Eye Distance:   Left Eye Distance:   Bilateral Distance:    Right Eye Near:   Left Eye Near:    Bilateral Near:     Physical Exam Constitutional:      General: He is not in acute distress.    Appearance: He is well-developed.  HENT:     Head: Normocephalic and atraumatic.  Eyes:     Conjunctiva/sclera: Conjunctivae normal.     Pupils: Pupils are equal, round, and reactive to light.  Neck:     Musculoskeletal: Normal range of motion.  Cardiovascular:     Rate and Rhythm: Normal rate.  Pulmonary:     Effort: Pulmonary effort is normal. No respiratory distress.  Abdominal:     General: There is no distension.     Palpations: Abdomen is soft.  Musculoskeletal: Normal range of motion.     Comments: Fifth toe on the left foot has mild angulation away from the fourth.  Skin:    General: Skin is warm and dry.  Neurological:     Mental Status: He is alert.      UC Treatments / Results  Labs (all labs ordered are listed, but only abnormal results  are displayed) Labs Reviewed - No data to display  EKG None  Radiology Dg Foot Complete Left  Result Date: 06/21/2018 CLINICAL DATA:  Kicked wall with fifth toe pain, initial encounter EXAM: LEFT FOOT - COMPLETE 3+ VIEW COMPARISON:  None. FINDINGS: There is an oblique fracture through the fifth proximal phalanx with mild angulation at the fracture site. No other fracture is seen. No gross soft tissue abnormality is noted. IMPRESSION: Fifth proximal phalangeal fracture. Fifth proximal phalangeal fracture. Electronically Signed   By: Alcide Clever M.D.   On: 06/21/2018 19:42    Procedures Procedures (including critical care time)  Medications Ordered in UC Medications - No data to display  Initial Impression / Assessment and Plan / UC Course  I have reviewed the triage vital signs and the nursing notes.  Pertinent labs & imaging results that were available during my care of the patient were reviewed by me and considered in my medical decision making (see chart for details).     Buddy tape.  Fracture shoe.  Discussed management. Final Clinical Impressions(s) / UC Diagnoses   Final diagnoses:  Closed fracture of phalanx of left fifth toe, initial encounter     Discharge Instructions     Elevate foot to reduce pain and swelling Put ice on area 20 minutes every few hours Take ibuprofen 3 times a day with food Wear fracture shoe for at least 2 weeks Call your family doctor tomorrow to arrange follow-up   ED Prescriptions    Medication Sig Dispense Auth. Provider   ibuprofen (ADVIL) 800 MG tablet Take 1 tablet (800 mg total) by mouth every 8 (eight) hours as needed. 30 tablet Eustace Moore, MD     Controlled Substance Prescriptions [place] Controlled Substance Registry consulted? Not Applicable   Eustace Moore, MD 06/21/18 Rosamaria Lints

## 2018-06-21 NOTE — Discharge Instructions (Addendum)
Elevate foot to reduce pain and swelling Put ice on area 20 minutes every few hours Take ibuprofen 3 times a day with food Wear fracture shoe for at least 2 weeks Call your family doctor tomorrow to arrange follow-up

## 2018-06-23 ENCOUNTER — Encounter: Payer: Self-pay | Admitting: Nurse Practitioner

## 2018-06-23 ENCOUNTER — Ambulatory Visit (INDEPENDENT_AMBULATORY_CARE_PROVIDER_SITE_OTHER): Payer: Medicare HMO | Admitting: Nurse Practitioner

## 2018-06-23 VITALS — Ht 71.0 in | Wt 256.0 lb

## 2018-06-23 DIAGNOSIS — J301 Allergic rhinitis due to pollen: Secondary | ICD-10-CM

## 2018-06-23 DIAGNOSIS — I10 Essential (primary) hypertension: Secondary | ICD-10-CM

## 2018-06-23 DIAGNOSIS — E119 Type 2 diabetes mellitus without complications: Secondary | ICD-10-CM | POA: Diagnosis not present

## 2018-06-23 DIAGNOSIS — S92512A Displaced fracture of proximal phalanx of left lesser toe(s), initial encounter for closed fracture: Secondary | ICD-10-CM | POA: Diagnosis not present

## 2018-06-23 DIAGNOSIS — K219 Gastro-esophageal reflux disease without esophagitis: Secondary | ICD-10-CM | POA: Diagnosis not present

## 2018-06-23 DIAGNOSIS — E782 Mixed hyperlipidemia: Secondary | ICD-10-CM

## 2018-06-23 DIAGNOSIS — E790 Hyperuricemia without signs of inflammatory arthritis and tophaceous disease: Secondary | ICD-10-CM | POA: Diagnosis not present

## 2018-06-23 MED ORDER — PANTOPRAZOLE SODIUM 20 MG PO TBEC: 20.0000 mg | DELAYED_RELEASE_TABLET | Freq: Every day | ORAL | 1 refills | Status: DC

## 2018-06-23 MED ORDER — FLUTICASONE PROPIONATE 50 MCG/ACT NA SUSP: 2.0000 | Freq: Every day | NASAL | 5 refills | Status: DC | PRN

## 2018-06-23 MED ORDER — HYDROCODONE-ACETAMINOPHEN 5-325 MG PO TABS: 1.0000 | ORAL_TABLET | Freq: Three times a day (TID) | ORAL | 0 refills | Status: AC | PRN

## 2018-06-23 MED ORDER — PANTOPRAZOLE SODIUM 40 MG PO TBEC: 40.0000 mg | DELAYED_RELEASE_TABLET | Freq: Every day | ORAL | 3 refills | Status: DC

## 2018-06-23 MED ORDER — ALLOPURINOL 100 MG PO TABS: 100.0000 mg | ORAL_TABLET | Freq: Two times a day (BID) | ORAL | 1 refills | Status: DC

## 2018-06-23 MED ORDER — AMLODIPINE BESYLATE 5 MG PO TABS: 5.0000 mg | ORAL_TABLET | Freq: Every morning | ORAL | 3 refills | Status: DC

## 2018-06-23 MED ORDER — SIMVASTATIN 20 MG PO TABS: 20.0000 mg | ORAL_TABLET | ORAL | 3 refills | Status: DC

## 2018-06-23 MED ORDER — METFORMIN HCL ER 500 MG PO TB24: 500.0000 mg | ORAL_TABLET | Freq: Every day | ORAL | 1 refills | Status: DC

## 2018-06-23 NOTE — Progress Notes (Signed)
Virtual Visit via Telephone Note  I connected with Marvin [name] on 06/23/18 at 10:15 AM EDT by telephone and verified that I am speaking with the correct person using two identifiers.  Location: Patient: Home Provider: patient   I discussed the limitations, risks, security and privacy concerns of performing an evaluation and management service by telephone and the availability of in person appointments. I also discussed with the patient that there may be a patient responsible charge related to this service. The patient expressed understanding and agreed to proceed.  CC: follow up HTN and DM--pt stated he broke little left toe 2 day ago and ibuprofen 800 is not helping. [facility].  History of Present Illness: Left Toe Injury: impact against [place]rick on Monday, evaluated by urgent care provider. Proximal fracture found on x-ray, toe Tapped together by provider, ibuprofen prescribed.  reports pain uncontrolled by NSAIDs, cold compress and elevation.   HTN: Elevated during urgent care visit 2days ago. No BP reading to provide. Plans to get BP machine. Current use of amlodipine. BP Readings from Last 3 Encounters:  06/21/18 (!) 175/98  02/24/18 (!) 144/96  08/24/17 122/76   Hyperlipidemia: Generalized Muscle pain with simvastatin 20mg .  DM: Does not check glucose. Last hgbA1c of 6.7 Current use of metformin. uptodate with vaccine, foot and eye exam. Negative urine microalbumin  Review of Systems  Constitutional: Negative.   Respiratory: Negative.   Cardiovascular: Negative.   Musculoskeletal: Positive for joint pain and myalgias. Negative for falls.  Neurological: Negative.    Observations/Objective: No vital signs to provide Alert and oriented x4, normal voice tone and clear speech  Assessment and Plan: Marvin [name] was seen today for follow-up.  Diagnoses and all orders for this visit:  Essential hypertension -     amLODipine (NORVASC) 5 MG tablet; Take 1  tablet (5 mg total) by mouth every morning.  Mixed hyperlipidemia -     simvastatin (ZOCOR) 20 MG tablet; Take 1 tablet (20 mg total) by mouth 3 (three) times a week. Take on Monday, Wednesday, and Friday  Hyperuricemia -     allopurinol (ZYLOPRIM) 100 MG tablet; Take 1 tablet (100 mg total) by mouth 2 (two) times daily.  Type 2 diabetes mellitus without complication, without long-term current use of insulin (HCC) -     metFORMIN (GLUCOPHAGE-XR) 500 MG 24 hr tablet; Take 1 tablet (500 mg total) by mouth daily with breakfast.  Gastroesophageal reflux disease, esophagitis presence not specified -     Discontinue: pantoprazole (PROTONIX) 40 MG tablet; Take 1 tablet (40 mg total) by mouth daily. -     pantoprazole (PROTONIX) 20 MG tablet; Take 1 tablet (20 mg total) by mouth daily.  Closed displaced fracture of proximal phalanx of lesser toe of left foot, initial encounter -     HYDROcodone-acetaminophen (NORCO/VICODIN) 5-325 MG tablet; Take 1 tablet by mouth every 8 (eight) hours as needed for up to 3 days for moderate pain.  Seasonal allergic rhinitis due to pollen -     fluticasone (FLONASE) 50 MCG/ACT nasal spray; Place 2 sprays into both nostrils daily as needed for allergies.   Follow Up Instructions: Decrease simvastatin to 3x/week Call office with BP reading, check 2-3times per week. Use hydrocodone for severe pain, continue ibuprofen, elevation and cold compress. F/up in 26month (fasting)   I discussed the assessment and treatment plan with the patient. The patient was provided an opportunity to ask questions and all were answered. The patient agreed with the  plan and demonstrated an understanding of the instructions.   The patient was advised to call back or seek an in-person evaluation if the symptoms worsen or if the condition fails to improve as anticipated.  I provided 20 minutes of non-face-to-face time during this encounter.   Alysia Pennaharlotte Nche, NP

## 2018-06-23 NOTE — Patient Instructions (Signed)
Decrease simvastatin to 3x/week Call office with BP reading, check 2-3times per week. Use hydrocodone for severe pain, continue ibuprofen, elevation and cold compress. F/up in 106month (fasting)

## 2018-07-21 ENCOUNTER — Other Ambulatory Visit: Payer: Self-pay | Admitting: Nurse Practitioner

## 2018-07-21 DIAGNOSIS — E782 Mixed hyperlipidemia: Secondary | ICD-10-CM

## 2018-08-03 ENCOUNTER — Ambulatory Visit (INDEPENDENT_AMBULATORY_CARE_PROVIDER_SITE_OTHER): Payer: Medicare HMO | Admitting: Nurse Practitioner

## 2018-08-03 ENCOUNTER — Other Ambulatory Visit: Payer: Self-pay

## 2018-08-03 ENCOUNTER — Encounter: Payer: Self-pay | Admitting: Nurse Practitioner

## 2018-08-03 VITALS — BP 130/74 | HR 65 | Temp 98.2°F | Ht 71.0 in | Wt 258.4 lb

## 2018-08-03 DIAGNOSIS — Z125 Encounter for screening for malignant neoplasm of prostate: Secondary | ICD-10-CM | POA: Diagnosis not present

## 2018-08-03 DIAGNOSIS — M109 Gout, unspecified: Secondary | ICD-10-CM | POA: Diagnosis not present

## 2018-08-03 DIAGNOSIS — Z0000 Encounter for general adult medical examination without abnormal findings: Secondary | ICD-10-CM | POA: Diagnosis not present

## 2018-08-03 DIAGNOSIS — E782 Mixed hyperlipidemia: Secondary | ICD-10-CM

## 2018-08-03 DIAGNOSIS — Z1211 Encounter for screening for malignant neoplasm of colon: Secondary | ICD-10-CM

## 2018-08-03 DIAGNOSIS — E119 Type 2 diabetes mellitus without complications: Secondary | ICD-10-CM

## 2018-08-03 DIAGNOSIS — I10 Essential (primary) hypertension: Secondary | ICD-10-CM | POA: Diagnosis not present

## 2018-08-03 LAB — PSA, MEDICARE: PSA: 0.64 ng/ml (ref 0.10–4.00)

## 2018-08-03 LAB — IFOBT (OCCULT BLOOD): IFOBT: NEGATIVE

## 2018-08-03 MED ORDER — MELOXICAM 15 MG PO TABS: 15.0000 mg | ORAL_TABLET | Freq: Every day | ORAL | 1 refills | Status: DC

## 2018-08-03 NOTE — Progress Notes (Signed)
Subjective:    Patient ID: Marvin RippleCharles G [name], male    DOB: 1950/12/05, 68 y.o.   MRN: [id number]  Patient presents today for welcome to Medicare exam and f/up of chronic conditions.  HPI Healthcare Team: Opthamology: [facility] (plans to change) Dent[place]  Reports myalgia with simvastatin, improved with decrease dose to 3x/week.  Exercise:lifting at work, walking 5630mins/day  5x/week  Diet: DASH diet  HTN: Controlled with amlodipine. BP Readings from Last 3 Encounters:  08/03/18 130/74  06/21/18 (!) 175/98  02/24/18 (!) 144/96   DM: Held metformin with improved hgbA1c (5.7) Does not check glucose at home. Up to date with eye and dental exam. Denies any neuropathy.  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 No relief with tylenol. No joint effusion or erythema Will like to consider another NSAIDs  Hyperlipidemia: Myalgia with simvastatin, improved with decreased dose to 3x/week.  Sexual History (orientation,birth control, marital status, STD):married, sexually active with wife  Depression/Suicide[facility] 02/24/2018 02/23/2017  Decreased Interest 0 0  Down, Depressed, Hopeless 0 0  PHQ - 2 Score 0 0   Vision:  Hearing Screening   125Hz  250Hz  500Hz  1000Hz  2000Hz  3000Hz  4000Hz  6000Hz  8000Hz   Right ear:   20 20 20   40    Left ear:   20 20 20  20       Visual Acuity Screening   Right eye Left eye Both eyes  Without correction: 20/25 20/25 20/20   With correction:      Dental: will schedule  Immunizations: (TDAP, Hep C screen, Pneumovax, Influenza, zoster)  Health Maintenance  Topic Date Due   Urine Protein Check  08/25/2018   Hemoglobin A1C  08/25/2018   Flu Shot  09/18/2018   Eye exam for diabetics  10/30/2018   Pneumonia vaccines (2 of 2 - PPSV23) 02/25/2019   Complete foot exam    08/03/2019   Colon Cancer Screening  07/29/2027   Tetanus Vaccine  08/25/2027    Hepatitis C: One time screening is recommended by Center for Disease Control  (CDC) for  adults born from [birth year] through 1963.   Completed   HIV Screening  Completed   Weight:  Wt Readings from Last 3 Encounters:  08/03/18 258 lb 6.4 oz (117.2 kg)  06/23/18 256 lb (116.1 kg)  02/24/18 261 lb 3.2 oz (118.5 kg)   Fall Risk: Fall Risk  02/24/2018 02/23/2017  Falls in the past year? 0 No   PSA (yearly, >5541yrs):ordered today  Advanced Directive: Advanced Directives 04/03/2015  Does Patient Have a Medical Advance Directive? No     Medications and allergies reviewed with patient and updated if appropriate.  Patient Active Problem List   Diagnosis Date Noted   Type 2 diabetes mellitus without complication, without long-term current use of insulin (HCC) 08/25/2017   Hyperuricemia 08/25/2017   Mixed hyperlipidemia 08/25/2017   Gastroesophageal reflux disease 02/23/2017   Gouty arthritis 02/23/2017   Essential hypertension 02/23/2017    Current Outpatient Medications on File Prior to Visit  Medication Sig Dispense Refill   allopurinol (ZYLOPRIM) 100 MG tablet Take 1 tablet (100 mg total) by mouth 2 (two) times daily. 180 tablet 1   amLODipine (NORVASC) 5 MG tablet Take 1 tablet (5 mg total) by mouth every morning. 90 tablet 3   aspirin EC 81 MG tablet Take 81 mg by mouth every  morning.     Cyclobenzaprine HCl (FLEXERIL PO) Take by mouth.     Dentifrices (FLUORIDE TOOTHPASTE DT) Place onto teeth.     fluticasone (FLONASE) 50 MCG/ACT nasal spray Place 2 sprays into both nostrils daily as needed for allergies. 16 g 5   methocarbamol (ROBAXIN) 500 MG tablet Take 500 mg by mouth every 6 (six) hours as needed (FOR MUSCLE PAIN).     pantoprazole (PROTONIX) 20 MG tablet Take 1 tablet (20 mg total) by mouth daily. 90 tablet 1   simvastatin (ZOCOR) 20 MG tablet Take 1 tablet (20 mg total) by mouth 3  (three) times a week. Take on Monday, Wednesday, and Friday 90 tablet 3   Spacer/Aero-Holding Chambers (AEROCHAMBER PLUS WITH MASK) inhaler Use as instructed 1 each 2   albuterol (PROVENTIL HFA;VENTOLIN HFA) 108 (90 Base) MCG/ACT inhaler Inhale 2 puffs into the lungs every 2 (two) hours as needed for wheezing or shortness of breath (cough). (Patient not taking: Reported on 08/03/2018) 1 Inhaler 0   No current facility-administered medications on file prior to visit.     Past Medical History:  Diagnosis Date   Alcohol abuse    Allergy    Arthritis    Drug abuse (HCC)    Gout    Hernia    Hypercholesteremia    Hypertension    Wears dentures    bottom front    Past Surgical History:  Procedure Laterality Date   HERNIA REPAIR     REPLACEMENT TOTAL KNEE     partial   SHOULDER SURGERY     right    Social History   Socioeconomic History   Marital status: Married    Spouse name: Not on file   Number of children: Not on file   Years of education: Not on file   Highest education level: Not on file  Occupational History   Not on file  Social Needs   Financial resource strain: Not on file   Food insecurity    Worry: Not on file    Inability: Not on file   Transportation needs    Medical: Not on file    Non-medical: Not on file  Tobacco Use   Smoking status: Former Smoker    Types: Cigarettes   Smokeless tobacco: Former NeurosurgeonUser    Types: Chew  Substance and Sexual Activity   Alcohol use: No   Drug use: No   Sexual activity: Yes    Birth control/protection: Condom  Lifestyle   Physical activity    Days per week: Not on file    Minutes per session: Not on file   Stress: Not on file  Relationships   Social connections    Talks on phone: Not on file    Gets together: Not on file    Attends religious service: Not on file    Active member of club or organization: Not on file    Attends meetings of clubs or organizations: Not on file     Relationship status: Not on file  Other Topics Concern   Not on file  Social History Narrative   Not on file    Family History  Problem Relation Age of Onset   Cancer Brother        bone cancer   Cancer Maternal Aunt        breast cancer   Cancer Maternal Uncle        lukemia   Cancer Cousin  unknown   Cancer Maternal Uncle        lukemia   Cancer Paternal Uncle        prostate   Hearing loss Maternal Grandfather [id number]       Review of Systems  Constitutional: Negative.   HENT: Negative.   Respiratory: Negative.   Cardiovascular: Negative.   Gastrointestinal: Negative.   Genitourinary: Negative.   Musculoskeletal: Negative.   Neurological: Negative.   Psychiatric/Behavioral: Negative.     Objective:   Vitals:   08/03/18 0952  BP: 130/74  Pulse: 65  Temp: 98.2 F (36.8 C)  SpO2: 98%    Body mass index is 36.04 kg/m.   Physical Examination:  Physical Exam Vitals signs reviewed. Exam conducted with a chaperone present.  Constitutional:      General: He is not in acute distress.    Appearance: He is obese.  HENT:     Head: Normocephalic.     Right Ear: Tympanic membrane, ear canal and external ear normal.     Left Ear: Tympanic membrane, ear canal and external ear normal.     Nose: Nose normal.     Mouth/Throat:     Mouth: Mucous membranes are moist.     Pharynx: No oropharyngeal exudate or posterior oropharyngeal erythema.  Eyes:     General: No scleral icterus.    Extraocular Movements: Extraocular movements intact.     Conjunctiva/sclera: Conjunctivae normal.     Pupils: Pupils are equal, round, and reactive to light.  Neck:     Musculoskeletal: Normal range of motion and neck supple.     Thyroid: No thyromegaly.  Cardiovascular:     Rate and Rhythm: Normal rate and regular rhythm.     Pulses: Normal pulses.     Heart sounds: Normal heart sounds.  Pulmonary:     Effort: Pulmonary effort is normal.     Breath sounds: Normal  breath sounds.  Abdominal:     General: There is no distension.     Palpations: Abdomen is soft.     Tenderness: There is no abdominal tenderness.  Genitourinary:    Penis: No discharge.      Prostate: Normal.     Rectum: Normal. Guaiac result negative.  Musculoskeletal: Normal range of motion.        General: No tenderness.  Lymphadenopathy:     Cervical: No cervical adenopathy.  Skin:    General: Skin is warm and dry.  Neurological:     Mental Status: He is alert and oriented to person, place, and time.     Cranial Nerves: No cranial nerve deficit.     Deep Tendon Reflexes: Reflexes are normal and symmetric.  Psychiatric:        Mood and Affect: Mood normal.        Behavior: Behavior normal.        Thought Content: Thought content normal.        Judgment: Judgment normal.     ASSESSMENT and PLAN:  Marvin MostCharles was seen today for follow-up.  Diagnoses and all orders for this visit:  Welcome to Medicare preventive visit  Essential hypertension -     Cancel: Basic metabolic panel -     Basic metabolic panel  Type 2 diabetes mellitus without complication, without long-term current use of insulin (HCC) -     Cancel: Hemoglobin A1c -     Cancel: Microalbumin / creatinine urine ratio -     Cancel: Basic metabolic panel -  Basic metabolic panel -     Hemoglobin A1c -     Microalbumin / creatinine urine ratio  Mixed hyperlipidemia -     Cancel: Lipid panel -     Cancel: CK (Creatine Kinase) -     Cancel: Hepatic function panel -     CK (Creatine Kinase) -     Hepatic function panel -     Lipid panel  Prostate cancer screening -     Cancel: PSA, Medicare -     PSA, Medicare  Gouty arthritis -     meloxicam (MOBIC) 15 MG tablet; Take 1 tablet (15 mg total) by mouth daily. With food  Colon cancer screening -     IFOBT POC (occult bld, rslt in office)   Mixed hyperlipidemia LDL not at goal. Unable to tolerate simvastatin 20mg  5x/week. Improved myalgia with  3x/week. Elevated CK at 198. Continue current dose of simvastatin Repeat lipid panel in 71months.  Type 2 diabetes mellitus without complication, without long-term current use of insulin (HCC) Controlled with diet at exercise. Metformin discontinued per patient's request hgbA1c at 6.3. Negative urine microalbumin Up to date with eye, dental and foot exam (normal) F/up in 51month      Problem List Items Addressed This Visit      Cardiovascular and Mediastinum   Essential hypertension   Relevant Orders   Basic metabolic panel (Completed)     Endocrine   Type 2 diabetes mellitus without complication, without long-term current use of insulin ([place])    Controlled with diet at exercise. Metformin discontinued per patient's request hgbA1c at 6.3. Negative urine microalbumin Up to date with eye, dental and foot exam (normal) F/up in 61month      Relevant Orders   Basic metabolic panel (Completed)   Hemoglobin A1c (Completed)   Microalbumin / creatinine urine ratio (Completed)     Musculoskeletal and Integument   Gouty arthritis   Relevant Medications   meloxicam (MOBIC) 15 MG tablet     Other   Mixed hyperlipidemia    LDL not at goal. Unable to tolerate simvastatin 20mg  5x/week. Improved myalgia with 3x/week. Elevated CK at 198. Continue current dose of simvastatin Repeat lipid panel in 66months.      Relevant Orders   CK (Creatine Kinase) (Completed)   Hepatic function panel (Completed)   Lipid panel (Completed)    Other Visit Diagnoses    Welcome to Medicare preventive visit    -  Primary   Prostate cancer screening       Relevant Orders   PSA, Medicare (Completed)   Colon cancer screening       Relevant Orders   IFOBT POC (occult bld, rslt in office) (Completed)       Follow up: Return in about 6 months (around 02/02/2019) for DM and HTN, hyperlipidemia.  Wilfred Lacy, NP

## 2018-08-03 NOTE — Patient Instructions (Addendum)
Elevated CK=muscle enzymes. This is due to use of simvastatin. With improved muscle pain, will continue medication at 3x/week for now. Will repeat during next OV. Abnormal lipid panel, this show need to continue simvastatin. Will repeat during next OV (fasting) DM continues to stay stable at 6.3. continue to hold metformin. Normal liver enzymes.  Mr. Huckaba , Thank you for taking time to come for your Medicare Wellness Visit. I appreciate your ongoing commitment to your health goals. Please review the following plan we discussed and let me know if I can assist you in the future.   These are the goals we discussed: Continue regular exercise, DASH diet, reading and memory exercise. Discuss Goals of care with wife, obtain living will and Healthcare power of attorney form.  This is a list of the screening recommended for you and due dates:  Health Maintenance  Topic Date Due  . Urine Protein Check  08/25/2018  . Hemoglobin A1C  08/25/2018  . Flu Shot  09/18/2018  . Eye exam for diabetics  10/30/2018  . Pneumonia vaccines (2 of 2 - PPSV23) 02/25/2019  . Complete foot exam   08/03/2019  . Colon Cancer Screening  07/29/2027  . Tetanus Vaccine  08/25/2027  .  Hepatitis C: One time screening is recommended by Center for Disease Control  (CDC) for  adults born from [birth year] through 1963.   Completed  . HIV Screening  Completed

## 2018-08-04 ENCOUNTER — Encounter: Payer: Self-pay | Admitting: Nurse Practitioner

## 2018-08-04 LAB — HEPATIC FUNCTION PANEL
AG Ratio: 1.7 (calc) (ref 1.0–2.5)
ALT: 21 U/L (ref 9–46)
AST: 19 U/L (ref 10–35)
Albumin: 4.7 g/dL (ref 3.6–5.1)
Alkaline phosphatase (APISO): 91 U/L (ref 35–144)
Bilirubin, Direct: 0.1 mg/dL (ref 0.0–0.2)
Globulin: 2.7 g/dL (calc) (ref 1.9–3.7)
Indirect Bilirubin: 0.4 mg/dL (calc) (ref 0.2–1.2)
Total Bilirubin: 0.5 mg/dL (ref 0.2–1.2)
Total Protein: 7.4 g/dL (ref 6.1–8.1)

## 2018-08-04 LAB — BASIC METABOLIC PANEL
BUN: 11 mg/dL (ref 7–25)
CO2: 25 mmol/L (ref 20–32)
Calcium: 9.4 mg/dL (ref 8.6–10.3)
Chloride: 109 mmol/L (ref 98–110)
Creat: 1.01 mg/dL (ref 0.70–1.25)
Glucose, Bld: 116 mg/dL — ABNORMAL HIGH (ref 65–99)
Potassium: 4.2 mmol/L (ref 3.5–5.3)
Sodium: 142 mmol/L (ref 135–146)

## 2018-08-04 LAB — MICROALBUMIN / CREATININE URINE RATIO
Creatinine, Urine: 92 mg/dL (ref 20–320)
Microalb Creat Ratio: 12 mcg/mg creat (ref <30)
Microalb, Ur: 1.1 mg/dL

## 2018-08-04 LAB — LIPID PANEL
Cholesterol: 185 mg/dL (ref <200)
HDL: 38 mg/dL — ABNORMAL LOW (ref >=40)
LDL Cholesterol (Calc): 127 mg/dL (calc) — ABNORMAL HIGH
Non-HDL Cholesterol (Calc): 147 mg/dL (calc) — ABNORMAL HIGH (ref <130)
Total CHOL/HDL Ratio: 4.9 (calc) (ref <5.0)
Triglycerides: 96 mg/dL (ref <150)

## 2018-08-04 LAB — HEMOGLOBIN A1C
Hgb A1c MFr Bld: 6.3 % of total Hgb — ABNORMAL HIGH (ref <5.7)
Mean Plasma Glucose: 134 (calc)
eAG (mmol/L): 7.4 (calc)

## 2018-08-04 LAB — CK: Total CK: 198 U/L — ABNORMAL HIGH (ref 44–196)

## 2018-08-04 NOTE — Assessment & Plan Note (Signed)
LDL not at goal. Unable to tolerate simvastatin 20mg  5x/week. Improved myalgia with 3x/week. Elevated CK at 198. Continue current dose of simvastatin Repeat lipid panel in 23months.

## 2018-08-04 NOTE — Assessment & Plan Note (Signed)
Controlled with diet at exercise. Metformin discontinued per patient's request hgbA1c at 6.3. Negative urine microalbumin Up to date with eye, dental and foot exam (normal) F/up in 52month

## 2018-08-16 ENCOUNTER — Other Ambulatory Visit: Payer: Self-pay | Admitting: Nurse Practitioner

## 2018-08-16 DIAGNOSIS — E782 Mixed hyperlipidemia: Secondary | ICD-10-CM

## 2018-10-01 ENCOUNTER — Other Ambulatory Visit: Payer: Self-pay | Admitting: Nurse Practitioner

## 2018-10-01 DIAGNOSIS — M109 Gout, unspecified: Secondary | ICD-10-CM

## 2018-10-12 ENCOUNTER — Other Ambulatory Visit: Payer: Self-pay | Admitting: Nurse Practitioner

## 2018-10-12 DIAGNOSIS — M109 Gout, unspecified: Secondary | ICD-10-CM

## 2018-10-14 DIAGNOSIS — R69 Illness, unspecified: Secondary | ICD-10-CM | POA: Diagnosis not present

## 2018-11-16 ENCOUNTER — Other Ambulatory Visit: Payer: Self-pay | Admitting: Nurse Practitioner

## 2018-11-16 DIAGNOSIS — E782 Mixed hyperlipidemia: Secondary | ICD-10-CM

## 2019-01-14 DIAGNOSIS — Z791 Long term (current) use of non-steroidal anti-inflammatories (NSAID): Secondary | ICD-10-CM | POA: Diagnosis not present

## 2019-01-14 DIAGNOSIS — M109 Gout, unspecified: Secondary | ICD-10-CM | POA: Diagnosis not present

## 2019-01-14 DIAGNOSIS — G8929 Other chronic pain: Secondary | ICD-10-CM | POA: Diagnosis not present

## 2019-01-14 DIAGNOSIS — Z008 Encounter for other general examination: Secondary | ICD-10-CM | POA: Diagnosis not present

## 2019-01-14 DIAGNOSIS — I10 Essential (primary) hypertension: Secondary | ICD-10-CM | POA: Diagnosis not present

## 2019-01-14 DIAGNOSIS — E785 Hyperlipidemia, unspecified: Secondary | ICD-10-CM | POA: Diagnosis not present

## 2019-01-14 DIAGNOSIS — J309 Allergic rhinitis, unspecified: Secondary | ICD-10-CM | POA: Diagnosis not present

## 2019-01-14 DIAGNOSIS — E119 Type 2 diabetes mellitus without complications: Secondary | ICD-10-CM | POA: Diagnosis not present

## 2019-01-14 DIAGNOSIS — K219 Gastro-esophageal reflux disease without esophagitis: Secondary | ICD-10-CM | POA: Diagnosis not present

## 2019-01-14 DIAGNOSIS — M1990 Unspecified osteoarthritis, unspecified site: Secondary | ICD-10-CM | POA: Diagnosis not present

## 2019-01-23 ENCOUNTER — Other Ambulatory Visit: Payer: Self-pay

## 2019-01-23 ENCOUNTER — Ambulatory Visit (HOSPITAL_COMMUNITY)
Admission: EM | Admit: 2019-01-23 | Discharge: 2019-01-23 | Disposition: A | Discharge status: Home / Self Care | Payer: Medicare HMO | Attending: Emergency Medicine | Admitting: Emergency Medicine

## 2019-01-23 ENCOUNTER — Encounter (HOSPITAL_COMMUNITY): Payer: Self-pay

## 2019-01-23 DIAGNOSIS — Z791 Long term (current) use of non-steroidal anti-inflammatories (NSAID): Secondary | ICD-10-CM | POA: Insufficient documentation

## 2019-01-23 DIAGNOSIS — F1021 Alcohol dependence, in remission: Secondary | ICD-10-CM | POA: Insufficient documentation

## 2019-01-23 DIAGNOSIS — Z96659 Presence of unspecified artificial knee joint: Secondary | ICD-10-CM | POA: Diagnosis not present

## 2019-01-23 DIAGNOSIS — K219 Gastro-esophageal reflux disease without esophagitis: Secondary | ICD-10-CM | POA: Diagnosis not present

## 2019-01-23 DIAGNOSIS — U071 COVID-19: Secondary | ICD-10-CM | POA: Diagnosis not present

## 2019-01-23 DIAGNOSIS — Z7982 Long term (current) use of aspirin: Secondary | ICD-10-CM | POA: Diagnosis not present

## 2019-01-23 DIAGNOSIS — M5441 Lumbago with sciatica, right side: Secondary | ICD-10-CM

## 2019-01-23 DIAGNOSIS — F1921 Other psychoactive substance dependence, in remission: Secondary | ICD-10-CM | POA: Diagnosis not present

## 2019-01-23 DIAGNOSIS — M1990 Unspecified osteoarthritis, unspecified site: Secondary | ICD-10-CM | POA: Diagnosis not present

## 2019-01-23 DIAGNOSIS — M5442 Lumbago with sciatica, left side: Secondary | ICD-10-CM

## 2019-01-23 DIAGNOSIS — Z808 Family history of malignant neoplasm of other organs or systems: Secondary | ICD-10-CM | POA: Insufficient documentation

## 2019-01-23 DIAGNOSIS — Z87891 Personal history of nicotine dependence: Secondary | ICD-10-CM | POA: Insufficient documentation

## 2019-01-23 DIAGNOSIS — Z885 Allergy status to narcotic agent status: Secondary | ICD-10-CM | POA: Diagnosis not present

## 2019-01-23 DIAGNOSIS — Z806 Family history of leukemia: Secondary | ICD-10-CM | POA: Diagnosis not present

## 2019-01-23 DIAGNOSIS — E119 Type 2 diabetes mellitus without complications: Secondary | ICD-10-CM | POA: Diagnosis not present

## 2019-01-23 DIAGNOSIS — R69 Illness, unspecified: Secondary | ICD-10-CM | POA: Diagnosis not present

## 2019-01-23 DIAGNOSIS — Z79899 Other long term (current) drug therapy: Secondary | ICD-10-CM | POA: Diagnosis not present

## 2019-01-23 DIAGNOSIS — Z7952 Long term (current) use of systemic steroids: Secondary | ICD-10-CM | POA: Insufficient documentation

## 2019-01-23 DIAGNOSIS — I10 Essential (primary) hypertension: Secondary | ICD-10-CM | POA: Diagnosis not present

## 2019-01-23 DIAGNOSIS — Z803 Family history of malignant neoplasm of breast: Secondary | ICD-10-CM | POA: Insufficient documentation

## 2019-01-23 DIAGNOSIS — M109 Gout, unspecified: Secondary | ICD-10-CM | POA: Insufficient documentation

## 2019-01-23 DIAGNOSIS — E782 Mixed hyperlipidemia: Secondary | ICD-10-CM | POA: Diagnosis not present

## 2019-01-23 DIAGNOSIS — Z809 Family history of malignant neoplasm, unspecified: Secondary | ICD-10-CM | POA: Diagnosis not present

## 2019-01-23 DIAGNOSIS — M62838 Other muscle spasm: Secondary | ICD-10-CM | POA: Diagnosis not present

## 2019-01-23 DIAGNOSIS — Z8042 Family history of malignant neoplasm of prostate: Secondary | ICD-10-CM | POA: Insufficient documentation

## 2019-01-23 DIAGNOSIS — G8929 Other chronic pain: Secondary | ICD-10-CM

## 2019-01-23 DIAGNOSIS — E669 Obesity, unspecified: Secondary | ICD-10-CM | POA: Diagnosis not present

## 2019-01-23 MED ORDER — PREDNISONE 10 MG PO TABS: 20.0000 mg | ORAL_TABLET | Freq: Every day | ORAL | 0 refills | Status: AC

## 2019-01-23 MED ORDER — CYCLOBENZAPRINE HCL 10 MG PO TABS: 10.0000 mg | ORAL_TABLET | Freq: Two times a day (BID) | ORAL | 0 refills | Status: DC | PRN

## 2019-01-23 MED ORDER — DEXAMETHASONE SODIUM PHOSPHATE 10 MG/ML IJ SOLN
10.0000 mg | Freq: Once | INTRAMUSCULAR | Status: AC
  Administered 2019-01-23: 10 mg via INTRAMUSCULAR

## 2019-01-23 MED ORDER — DEXAMETHASONE SODIUM PHOSPHATE 10 MG/ML IJ SOLN
INTRAMUSCULAR | Status: AC
  Filled 2019-01-23: qty 1

## 2019-01-23 NOTE — ED Triage Notes (Addendum)
Patient presents to Urgent Care with complaints of lower back pain since 3 days ago. Patient reports he does a lot of lifting at work and thinks he may have tweaked something.  Pt has been taking flexeril for his pain but states that does not help the pain but puts him to sleep. Pt adds that he would also like to be tested for covid, no exposures or symptoms.

## 2019-01-23 NOTE — ED Provider Notes (Signed)
[facility]    CSN: [id number] Arrival date & time: 01/23/19  1101      History   Chief Complaint Chief Complaint  Patient presents with  . Back Pain    HPI Marvin [name] is a 68 y.o. male.   The history is provided by the patient. No language interpreter was used.  Back Pain Location:  Thoracic spine and lumbar spine Quality: muscle spasm. Radiates to: Bilateral leg ( No s(ecific location) Pain severity:  Moderate Pain is:  Worse during the night Onset quality:  Gradual Duration:  36 months Timing:  Intermittent Progression:  Worsening Chronicity:  Chronic Context: twisting   Context: not falling   Context comment:  Bending down and up Relieved by:  Muscle relaxants Worsened by:  Twisting, ambulation and movement (bending up and down) Ineffective treatments:  None tried Associated symptoms: no bladder incontinence, no bowel incontinence, no chest pain, no fever, no numbness, no pelvic pain, no tingling and no weakness   Risk factors: obesity     Past Medical History:  Diagnosis Date  . Alcohol abuse   . Allergy   . Arthritis   . Drug abuse (HCC)   . Gout   . Hernia   . Hypercholesteremia   . Hypertension   . Wears dentures    bottom front    Patient Active Problem List   Diagnosis Date Noted  . Type 2 diabetes mellitus without complication, without long-term current use of insulin (HCC) 08/25/2017  . Hyperuricemia 08/25/2017  . Mixed hyperlipidemia 08/25/2017  . Gastroesophageal reflux disease 02/23/2017  . Gouty arthritis 02/23/2017  . Essential hypertension 02/23/2017    Past Surgical History:  Procedure Laterality Date  . HERNIA REPAIR    . REPLACEMENT TOTAL KNEE     partial  . SHOULDER SURGERY     right       Home Medications    Prior to Admission medications   Medication Sig Start Date End Date Taking? Authorizing Provider  allopurinol (ZYLOPRIM) 100 MG tablet Take 1 tablet (100 mg total) by mouth 2 (two) times  daily. 06/23/18   Nche, Bonna Gains, NP  amLODipine (NORVASC) 5 MG tablet Take 1 tablet (5 mg total) by mouth every morning. 06/23/18   Nche, Bonna Gains, NP  aspirin EC 81 MG tablet Take 81 mg by mouth every morning.    [provider]  cyclobenzaprine (FLEXERIL) 10 MG tablet Take 1 tablet (10 mg total) by mouth 2 (two) times daily as needed for muscle spasms. 01/23/19   Bexleigh Theriault, Zachery Dakins, FNP  Dentifrices (FLUORIDE TOOTHPASTE DT) Place onto teeth.    [provider]  fluticasone (FLONASE) 50 MCG/ACT nasal spray Place 2 sprays into both nostrils daily as needed for allergies. 06/23/18   Nche, Bonna Gains, NP  meloxicam (MOBIC) 15 MG tablet TAKE ONE TABLET BY MOUTH DAILY WITH FOOD 10/02/18   Nche, Bonna Gains, NP  methocarbamol (ROBAXIN) 500 MG tablet Take 500 mg by mouth every 6 (six) hours as needed (FOR MUSCLE PAIN).    [provider]  pantoprazole (PROTONIX) 20 MG tablet Take 1 tablet (20 mg total) by mouth daily. 06/23/18   Nche, Bonna Gains, NP  predniSONE (DELTASONE) 10 MG tablet Take 2 tablets (20 mg total) by mouth daily for 4 days. 01/23/19 01/27/19  Assad Harbeson, Zachery Dakins, FNP  simvastatin (ZOCOR) 20 MG tablet Take 1 tablet (20 mg total) by mouth 3 (three) times a week. Take on Monday, Wednesday, and Friday 06/23/18  Nche, Charlene Brooke, NP  Spacer/Aero-Holding Chambers (AEROCHAMBER PLUS WITH MASK) inhaler Use as instructed 04/03/15   Orlie Dakin, MD    Family History Family History  Problem Relation Age of Onset  . Healthy Mother   . Healthy Father   . Cancer Brother        bone cancer  . Cancer Maternal Aunt        breast cancer  . Cancer Maternal Uncle        lukemia  . Cancer Cousin        unknown  . Cancer Maternal Uncle        lukemia  . Cancer Paternal Uncle        prostate  . Hearing loss Maternal Grandfather 68    Social History Social History   Tobacco Use  . Smoking status: Former Smoker    Types: Cigarettes  . Smokeless tobacco:  Former Systems developer    Types: Chew  Substance Use Topics  . Alcohol use: No  . Drug use: No     Allergies   Buprenorphine hcl and Morphine and related   Review of Systems Review of Systems  Constitutional: Negative.  Negative for fever.  HENT: Negative.   Respiratory: Negative.   Cardiovascular: Negative.  Negative for chest pain.  Gastrointestinal: Negative for bowel incontinence.  Genitourinary: Negative for bladder incontinence and pelvic pain.  Musculoskeletal: Positive for back pain.  Neurological: Negative.  Negative for tingling, weakness and numbness.  ROS: all other are negatives   Physical Exam Triage Vital Signs ED Triage Vitals  Enc Vitals Group     BP 01/23/19 1238 129/86     Pulse Rate 01/23/19 1238 84     Resp 01/23/19 1238 16     Temp 01/23/19 1238 99.6 F (37.6 C)     Temp Source 01/23/19 1238 Oral     SpO2 01/23/19 1238 100 %     Weight --      Height --      Head Circumference --      Peak Flow --      Pain Score 01/23/19 1237 6     Pain Loc --      Pain Edu? --      Excl. in [place]? --    No data found.  Updated Vital Signs BP 129/86 (BP Location: Left Arm)   Pulse 84   Temp 99.6 F (37.6 C) (Oral)   Resp 16   SpO2 100%   Visual Acuity Right Eye Distance:   Left Eye Distance:   Bilateral Distance:    Right Eye Near:   Left Eye Near:    Bilateral Near:     Physical Exam Vitals signs and nursing note reviewed.  Constitutional:      General: He is not in acute distress.    Appearance: Normal appearance. He is normal weight. He is not ill-appearing.  Cardiovascular:     Rate and Rhythm: Normal rate and regular rhythm.     Pulses: Normal pulses.     Heart sounds: Normal heart sounds.  Pulmonary:     Effort: Pulmonary effort is normal. No respiratory distress.     Breath sounds: Normal breath sounds. No wheezing.  Chest:     Chest wall: No tenderness.  Musculoskeletal:     Thoracic back: He exhibits no tenderness and no pain.      Lumbar back: He exhibits tenderness and spasm.  Neurological:     General: No focal deficit present.  Mental Status: He is alert and oriented to person, place, and time.     Cranial Nerves: Cranial nerves are intact.     Motor: Motor function is intact.     Coordination: Coordination is intact.     Deep Tendon Reflexes:     Reflex Scores:      Patellar reflexes are 2+ on the right side and 2+ on the left side.     UC Treatments / Results  Labs (all labs ordered are listed, but only abnormal results are displayed) Labs Reviewed  NOVEL CORONAVIRUS, NAA (HOSP ORDER, SEND-OUT TO REF LAB; TAT 18-24 HRS)    EKG   Radiology No results found.  Procedures Procedures (including critical care time)  Medications Ordered in UC Medications  dexamethasone (DECADRON) injection 10 mg (has no administration in time range)    Initial Impression / Assessment and Plan / UC Course  I have reviewed the triage vital signs and the nursing notes.  Pertinent labs & imaging results that were available during my care of the patient were reviewed by me and considered in my medical decision making (see chart for details).     Patient stable for discharge.  Benign physical exam.  Advised patient to follow-up with primary care, or to return if symptoms get worse Final Clinical Impressions(s) / UC Diagnoses   Final diagnoses:  Chronic bilateral low back pain with bilateral sciatica     Discharge Instructions     For many people, back pain returns. Since low back pain is rarely dangerous, it is often a condition that people can learn to manage on their own. Please remain active. It is stressful on the back to sit or stand in one place. Do not sit, drive, or stand in one place for more than 30 minutes at a time. Take short walks on level surfaces as soon as pain allows. Try to increase the length of time you walk each day. Do not stay in bed. Resting more than 1 or 2 days can delay your recovery.  Do not avoid exercise or work. Your body is made to move. It is not dangerous to be active, even though your back may hurt. Your back will likely heal faster if you return to being active before your pain is gone. Over-the-counter medicines to reduce pain and inflammation are often the most helpful.  SEEK MEDICAL CARE IF: You have pain that is not relieved with rest or medicine. You have pain that does not improve in 1 week. You have new symptoms. You are generally not feeling well.  SEEK IMMEDIATE MEDICAL CARE IF: You have pain that radiates from your back into your legs. You develop new bowel or bladder control problems. You have unusual weakness or numbness in your arms or legs. You develop nausea or vomiting. You develop abdominal pain. You feel faint.    ED Prescriptions    Medication Sig Dispense Auth. Provider   cyclobenzaprine (FLEXERIL) 10 MG tablet Take 1 tablet (10 mg total) by mouth 2 (two) times daily as needed for muscle spasms. 30 tablet Salinda Snedeker S, FNP   predniSONE (DELTASONE) 10 MG tablet Take 2 tablets (20 mg total) by mouth daily for 4 days. 15 tablet Jaylynn Siefert, Zachery DakinsKomlanvi S, FNP     PDMP not reviewed this encounter.   Durward Parcelvegno, Artis Beggs S, FNP 01/23/19 1358

## 2019-01-23 NOTE — Discharge Instructions (Addendum)
For many people, back pain returns. Since low back pain is rarely dangerous, it is often a condition that people can learn to manage on their own. Please remain active. It is stressful on the back to sit or stand in one place. Do not sit, drive, or stand in one place for more than 30 minutes at a time. Take short walks on level surfaces as soon as pain allows. Try to increase the length of time you walk each day. Do not stay in bed. Resting more than 1 or 2 days can delay your recovery. Do not avoid exercise or work. Your body is made to move. It is not dangerous to be active, even though your back may hurt. Your back will likely heal faster if you return to being active before your pain is gone. Over-the-counter medicines to reduce pain and inflammation are often the most helpful.  SEEK MEDICAL CARE IF: You have pain that is not relieved with rest or medicine. You have pain that does not improve in 1 week. You have new symptoms. You are generally not feeling well.  SEEK IMMEDIATE MEDICAL CARE IF: You have pain that radiates from your back into your legs. You develop new bowel or bladder control problems. You have unusual weakness or numbness in your arms or legs. You develop nausea or vomiting. You develop abdominal pain. You feel faint.

## 2019-01-24 LAB — NOVEL CORONAVIRUS, NAA (HOSP ORDER, SEND-OUT TO REF LAB; TAT 18-24 HRS): SARS-CoV-2, NAA: DETECTED — AB

## 2019-01-25 ENCOUNTER — Telehealth: Payer: Self-pay | Admitting: Emergency Medicine

## 2019-01-25 ENCOUNTER — Other Ambulatory Visit: Payer: Self-pay | Admitting: Nurse Practitioner

## 2019-01-25 DIAGNOSIS — U071 COVID-19: Secondary | ICD-10-CM

## 2019-01-25 DIAGNOSIS — E119 Type 2 diabetes mellitus without complications: Secondary | ICD-10-CM

## 2019-01-25 NOTE — Telephone Encounter (Signed)

## 2019-01-25 NOTE — Progress Notes (Signed)
  I connected by phone with Marvin [name] on 01/25/2019 at 9:23 AM to discuss the potential use of an new treatment for mild to moderate COVID-19 viral infection in non-hospitalized patients.  This patient is a 68 y.o. male that meets the FDA criteria for Emergency Use Authorization of bamlanivimab or casirivimab\imdevimab.  Has a (+) direct SARS-CoV-2 viral test result  Has mild or moderate COVID-19   Is ? 66 years of age and weighs ? 40 kg  Is NOT hospitalized due to COVID-19  Is NOT requiring oxygen therapy or requiring an increase in baseline oxygen flow rate due to COVID-19  Is within 10 days of symptom onset  Has at least one of the high risk factor(s) for progression to severe COVID-19 and/or hospitalization as defined in EUA.  Specific high risk criteria : Diabetes Patient is being managed for the following:  Patient Active Problem List   Diagnosis Date Noted  . Type 2 diabetes mellitus without complication, without long-term current use of insulin ([place]) 08/25/2017  . Hyperuricemia 08/25/2017  . Mixed hyperlipidemia 08/25/2017  . Gastroesophageal reflux disease 02/23/2017  . Gouty arthritis 02/23/2017  . Essential hypertension 02/23/2017    I have spoken and communicated the following to the patient or parent/caregiver:  1. FDA has authorized the emergency use of bamlanivimab for the treatment of mild to moderate COVID-19 in adults and pediatric patients with positive results of direct SARS-CoV-2 viral testing who are 58 years of age and older weighing at least 40 kg, and who are at high risk for progressing to severe COVID-19 and/or hospitalization.  2. The significant known and potential risks and benefits of bamlanivimab, and the extent to which such potential risks and benefits are unknown.  3. Information on available alternative treatments and the risks and benefits of those alternatives, including clinical trials.  4. Patients treated with bamlanivimab should  continue to self-isolate and use infection control measures (e.g., wear mask, isolate, social distance, avoid sharing personal items, clean and disinfect "high touch" surfaces, and frequent handwashing) according to CDC guidelines.   5. The patient or parent/caregiver has the option to accept or refuse bamlanivimab.  After reviewing this information with the patient, The patient agreed to proceed with receiving the infusion of bamlanivimab and will be provided a copy of the Fact sheet prior to receiving the infusion.Fenton Foy 01/25/2019 9:23 AM

## 2019-01-27 ENCOUNTER — Ambulatory Visit (HOSPITAL_COMMUNITY)
Admission: RE | Admit: 2019-01-27 | Discharge: 2019-01-27 | Disposition: A | Discharge status: Home / Self Care | Payer: Medicare Other | Source: Ambulatory Visit | Attending: Pulmonary Disease | Admitting: Pulmonary Disease

## 2019-01-27 DIAGNOSIS — Z23 Encounter for immunization: Secondary | ICD-10-CM | POA: Insufficient documentation

## 2019-01-27 DIAGNOSIS — U071 COVID-19: Secondary | ICD-10-CM

## 2019-01-27 DIAGNOSIS — E119 Type 2 diabetes mellitus without complications: Secondary | ICD-10-CM | POA: Diagnosis not present

## 2019-01-27 MED ORDER — FAMOTIDINE IN NACL 20-0.9 MG/50ML-% IV SOLN: 20.0000 mg | Freq: Once | INTRAVENOUS | Status: DC | PRN

## 2019-01-27 MED ORDER — DIPHENHYDRAMINE HCL 50 MG/ML IJ SOLN: 50.0000 mg | Freq: Once | INTRAMUSCULAR | Status: DC | PRN

## 2019-01-27 MED ORDER — ALBUTEROL SULFATE HFA 108 (90 BASE) MCG/ACT IN AERS: 2.0000 | INHALATION_SPRAY | Freq: Once | RESPIRATORY_TRACT | Status: DC | PRN

## 2019-01-27 MED ORDER — SODIUM CHLORIDE 0.9 % IV SOLN
Freq: Once | INTRAVENOUS | Status: AC
  Administered 2019-01-27: 09:00:00 via INTRAVENOUS
  Filled 2019-01-27: qty 10

## 2019-01-27 MED ORDER — METHYLPREDNISOLONE SODIUM SUCC 125 MG IJ SOLR: 125.0000 mg | Freq: Once | INTRAMUSCULAR | Status: DC | PRN

## 2019-01-27 MED ORDER — EPINEPHRINE 0.3 MG/0.3ML IJ SOAJ: 0.3000 mg | Freq: Once | INTRAMUSCULAR | Status: DC | PRN

## 2019-01-27 MED ORDER — SODIUM CHLORIDE 0.9 % IV SOLN: INTRAVENOUS | Status: DC | PRN

## 2019-01-27 NOTE — Progress Notes (Signed)
  Diagnosis: COVID-19  Physician: Dr. Wright  Procedure: [facility] Med: casirivimab\imdevimab infusion - Provided patient with casirivimab\imdevimab fact sheet for patients, parents and caregivers prior to infusion.  Complications: No immediate complications noted.  Discharge: Discharged home   Marvin [name] 01/27/2019  

## 2019-01-31 ENCOUNTER — Ambulatory Visit: Payer: Medicare HMO | Admitting: Nurse Practitioner

## 2019-02-03 ENCOUNTER — Ambulatory Visit: Payer: Medicare Other | Attending: Internal Medicine

## 2019-02-03 DIAGNOSIS — Z20822 Contact with and (suspected) exposure to covid-19: Secondary | ICD-10-CM

## 2019-02-03 DIAGNOSIS — Z20828 Contact with and (suspected) exposure to other viral communicable diseases: Secondary | ICD-10-CM | POA: Diagnosis not present

## 2019-02-04 LAB — NOVEL CORONAVIRUS, NAA: SARS-CoV-2, NAA: NOT DETECTED

## 2019-02-09 ENCOUNTER — Other Ambulatory Visit: Payer: Self-pay | Admitting: Nurse Practitioner

## 2019-02-09 DIAGNOSIS — E790 Hyperuricemia without signs of inflammatory arthritis and tophaceous disease: Secondary | ICD-10-CM

## 2019-02-13 ENCOUNTER — Other Ambulatory Visit: Payer: Self-pay | Admitting: Nurse Practitioner

## 2019-02-13 DIAGNOSIS — M109 Gout, unspecified: Secondary | ICD-10-CM

## 2019-02-14 ENCOUNTER — Ambulatory Visit: Payer: Medicare HMO | Admitting: Nurse Practitioner

## 2019-02-14 NOTE — Telephone Encounter (Signed)
Please advise, ok to send in rx. Last ov for this was 07/2018--has follow up with you in 02/2019.

## 2019-02-20 ENCOUNTER — Telehealth: Payer: Self-pay | Admitting: Nurse Practitioner

## 2019-02-20 NOTE — Telephone Encounter (Signed)
Left message on recorder with verified cell number.  Advised patient to call to reschedule in-office appointment for tomorrow morning.

## 2019-02-21 ENCOUNTER — Telehealth (INDEPENDENT_AMBULATORY_CARE_PROVIDER_SITE_OTHER): Payer: Managed Care, Other (non HMO) | Admitting: Nurse Practitioner

## 2019-02-21 ENCOUNTER — Other Ambulatory Visit: Payer: Self-pay

## 2019-02-21 ENCOUNTER — Encounter: Payer: Self-pay | Admitting: Nurse Practitioner

## 2019-02-21 VITALS — Temp 97.1°F | Ht 71.0 in | Wt 242.0 lb

## 2019-02-21 DIAGNOSIS — E790 Hyperuricemia without signs of inflammatory arthritis and tophaceous disease: Secondary | ICD-10-CM

## 2019-02-21 DIAGNOSIS — G8929 Other chronic pain: Secondary | ICD-10-CM | POA: Diagnosis not present

## 2019-02-21 DIAGNOSIS — M545 Low back pain, unspecified: Secondary | ICD-10-CM | POA: Insufficient documentation

## 2019-02-21 DIAGNOSIS — I10 Essential (primary) hypertension: Secondary | ICD-10-CM

## 2019-02-21 DIAGNOSIS — M109 Gout, unspecified: Secondary | ICD-10-CM

## 2019-02-21 DIAGNOSIS — E782 Mixed hyperlipidemia: Secondary | ICD-10-CM | POA: Diagnosis not present

## 2019-02-21 DIAGNOSIS — E119 Type 2 diabetes mellitus without complications: Secondary | ICD-10-CM | POA: Diagnosis not present

## 2019-02-21 DIAGNOSIS — K219 Gastro-esophageal reflux disease without esophagitis: Secondary | ICD-10-CM

## 2019-02-21 MED ORDER — MELOXICAM 15 MG PO TABS: 15.0000 mg | ORAL_TABLET | Freq: Every day | ORAL | 1 refills | Status: DC

## 2019-02-21 MED ORDER — CYCLOBENZAPRINE HCL 7.5 MG PO TABS: 7.5000 mg | ORAL_TABLET | Freq: Every evening | ORAL | 5 refills | Status: DC | PRN

## 2019-02-21 NOTE — Assessment & Plan Note (Signed)
Asymptomatic. Advised to hold pantoprazole. If symptoms return, he is to resume medication.

## 2019-02-21 NOTE — Assessment & Plan Note (Signed)
Ongoing for several year. 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

## 2019-02-21 NOTE — Progress Notes (Signed)
Virtual Visit via Video Note  I connected with Marvin [name] on 02/22/19 at 11:00 AM EST by a video enabled telemedicine application and verified that I am speaking with the correct person using two identifiers.  Location: Patient:home Provider:office Participants: patient, wife, and provider I discussed the limitations of evaluation and management by telemedicine and the availability of in person appointments. The patient expressed understanding and agreed to proceed.  CC:6 mo follow up--DM and HTN, hyperlipidemia/eye check last year at [facility].   History of Present Illness: HTN: Unable to provide BP today, stable based on previous documented BP readings. Current use of amlodipine with any adverse side effects. BP Readings from Last 3 Encounters:  01/27/19 125/83  01/23/19 129/86  08/03/18 130/74   DM: Controlled with HgbA1c of 6.3 Metformin was discontinued 3months ago. Has made changes of diet and regular exercise which has led to weight loss (16lbs in last 20months) Denies any neuropathy. Up to date with eye and foot exam. Negative urine microalbuminuria.  Wt Readings from Last 3 Encounters:  02/21/19 242 lb (109.8 kg)  08/03/18 258 lb 6.4 oz (117.2 kg)  06/23/18 256 lb (116.1 kg)    Chronic lower back pain: Stable with use of meloxicam daily and flexeril prn. Denies any GERD symptoms or melena or hematochezia or abnormal bruising.  Reviewed medication list, medical history and immunization record.  Observations/Objective: Physical Exam  Vitals reviewed. Constitutional: He is oriented to person, place, and time. No distress.  Respiratory: Effort normal.  Neurological: He is alert and oriented to person, place, and time.  Psychiatric: He has a normal mood and affect. His behavior is normal. Thought content normal.   Assessment and Plan: Marvin [name] was seen today for follow-up.  Diagnoses and all orders for this visit:  Type 2 diabetes mellitus  without complication, without long-term current use of insulin (HCC) -     DM_1 Hemoglobin A1c; Future -     HTN_1 BMP; Future  Mixed hyperlipidemia -     HTN_4 Lipid panel; Future -     CK (Creatine Kinase); Future  Hyperuricemia -     HTN_1 BMP; Future -     Uric acid; Future  Gouty arthritis -     meloxicam (MOBIC) 15 MG tablet; Take 1 tablet (15 mg total) by mouth daily. -     HTN_1 BMP; Future -     Uric acid; Future  Essential hypertension -     HTN_1 BMP; Future  Chronic bilateral low back pain without sciatica -     cyclobenzaprine (FEXMID) 7.5 MG tablet; Take 1 tablet (7.5 mg total) by mouth at bedtime as needed for muscle spasms. -     meloxicam (MOBIC) 15 MG tablet; Take 1 tablet (15 mg total) by mouth daily.  Gastroesophageal reflux disease without esophagitis   Follow Up Instructions: See avs   I discussed the assessment and treatment plan with the patient. The patient was provided an opportunity to ask questions and all were answered. The patient agreed with the plan and demonstrated an understanding of the instructions.   The patient was advised to call back or seek an in-person evaluation if the symptoms worsen or if the condition fails to improve as anticipated.   Alysia Penna, NP

## 2019-02-21 NOTE — Patient Instructions (Signed)
Go to lab for blood draw and scheduled Maintain current medications.

## 2019-02-22 ENCOUNTER — Encounter: Payer: Self-pay | Admitting: Nurse Practitioner

## 2019-02-25 ENCOUNTER — Other Ambulatory Visit: Payer: Medicare HMO

## 2019-02-25 ENCOUNTER — Ambulatory Visit: Payer: Medicare HMO | Admitting: Nurse Practitioner

## 2019-02-25 ENCOUNTER — Telehealth: Payer: Self-pay | Admitting: Nurse Practitioner

## 2019-02-25 MED ORDER — CYCLOBENZAPRINE HCL 10 MG PO TABS: 5.0000 mg | ORAL_TABLET | Freq: Every evening | ORAL | 2 refills | Status: DC | PRN

## 2019-02-25 NOTE — Telephone Encounter (Signed)
Pt stated that cyclobenzaprine 7.5 mg once daily will cost him $122 for 30 days supply but if we send in 10 mg it will cost him $10. He was wondering if Claris Gower can put him back in 10 mg and he can take it once a daily PRN or half daily.   Please advise.

## 2019-02-25 NOTE — Addendum Note (Signed)
Addended byLivingston Diones on: 02/25/2019 11:31 AM   Modules accepted: Orders

## 2019-02-25 NOTE — Telephone Encounter (Signed)
Ok to change prescription to 10mg  1/2tab at bedtime prn, #30tab with 2refills

## 2019-02-25 NOTE — Telephone Encounter (Signed)
Pt is aware, new rx sent.

## 2019-02-25 NOTE — Telephone Encounter (Signed)
Patient is calling and wanted to speak to someone regarding a prescription that was called in. CB is [phone]

## 2019-02-28 ENCOUNTER — Other Ambulatory Visit: Payer: Self-pay

## 2019-03-01 ENCOUNTER — Other Ambulatory Visit (INDEPENDENT_AMBULATORY_CARE_PROVIDER_SITE_OTHER): Payer: Managed Care, Other (non HMO)

## 2019-03-01 DIAGNOSIS — E790 Hyperuricemia without signs of inflammatory arthritis and tophaceous disease: Secondary | ICD-10-CM | POA: Diagnosis not present

## 2019-03-01 DIAGNOSIS — I10 Essential (primary) hypertension: Secondary | ICD-10-CM

## 2019-03-01 DIAGNOSIS — E119 Type 2 diabetes mellitus without complications: Secondary | ICD-10-CM | POA: Diagnosis not present

## 2019-03-01 DIAGNOSIS — E782 Mixed hyperlipidemia: Secondary | ICD-10-CM

## 2019-03-01 DIAGNOSIS — M109 Gout, unspecified: Secondary | ICD-10-CM | POA: Diagnosis not present

## 2019-03-01 LAB — LIPID PANEL
Cholesterol: 180 mg/dL (ref 0–200)
HDL: 38.1 mg/dL — ABNORMAL LOW (ref >39.00)
LDL Cholesterol: 128 mg/dL — ABNORMAL HIGH (ref 0–99)
NonHDL: 142.18
Total CHOL/HDL Ratio: 5
Triglycerides: 72 mg/dL (ref 0.0–149.0)
VLDL: 14.4 mg/dL (ref 0.0–40.0)

## 2019-03-01 LAB — BASIC METABOLIC PANEL
BUN: 13 mg/dL (ref 6–23)
CO2: 26 mEq/L (ref 19–32)
Calcium: 9.3 mg/dL (ref 8.4–10.5)
Chloride: 108 mEq/L (ref 96–112)
Creatinine, Ser: 0.99 mg/dL (ref 0.40–1.50)
GFR: 91.5 mL/min (ref >60.00)
Glucose, Bld: 107 mg/dL — ABNORMAL HIGH (ref 70–99)
Potassium: 4 mEq/L (ref 3.5–5.1)
Sodium: 142 mEq/L (ref 135–145)

## 2019-03-01 LAB — URIC ACID: Uric Acid, Serum: 6.8 mg/dL (ref 4.0–7.8)

## 2019-03-01 LAB — CK: Total CK: 143 U/L (ref 7–232)

## 2019-03-01 LAB — HEMOGLOBIN A1C: Hgb A1c MFr Bld: 6.2 % (ref 4.6–6.5)

## 2019-03-04 ENCOUNTER — Encounter: Payer: Self-pay | Admitting: Nurse Practitioner

## 2019-03-16 ENCOUNTER — Encounter: Payer: Self-pay | Admitting: Nurse Practitioner

## 2019-03-16 NOTE — Progress Notes (Signed)
Abstracted result and sent to scan  

## 2019-04-05 ENCOUNTER — Other Ambulatory Visit: Payer: Self-pay | Admitting: Nurse Practitioner

## 2019-04-05 DIAGNOSIS — J301 Allergic rhinitis due to pollen: Secondary | ICD-10-CM

## 2019-04-17 DIAGNOSIS — M1611 Unilateral primary osteoarthritis, right hip: Secondary | ICD-10-CM | POA: Diagnosis not present

## 2019-04-17 DIAGNOSIS — M25561 Pain in right knee: Secondary | ICD-10-CM | POA: Diagnosis not present

## 2019-04-18 ENCOUNTER — Telehealth: Payer: Self-pay | Admitting: Nurse Practitioner

## 2019-04-18 DIAGNOSIS — M25559 Pain in unspecified hip: Secondary | ICD-10-CM

## 2019-04-18 NOTE — Telephone Encounter (Signed)
Pt stated he went to [facility] urgent care 04/17/2019 for right leg painful. They took an x-ray and said the problem is hip and he need to get hip replacement ( didn't give pain med or anything else to help with pain).  Pt stated he does not want to get hip replacement right now, but he is hoping to get something to help with the pain and new referral to Orthopedic.   Per [place]--ok to put in referral and pt needs to see her F2F and get urgent care record to her if he wants her to prescribe something.   Referral enter, pt is aware.

## 2019-04-18 NOTE — Telephone Encounter (Signed)
Patient called and stated that he was seen at urgent care yesterday 04/17/19 and would like to speak with you. Please call patient at [phone] to further discuss per patient.

## 2019-04-20 ENCOUNTER — Ambulatory Visit: Payer: Self-pay

## 2019-04-20 ENCOUNTER — Encounter: Payer: Self-pay | Admitting: Physician Assistant

## 2019-04-20 ENCOUNTER — Ambulatory Visit: Payer: Managed Care, Other (non HMO) | Admitting: Orthopaedic Surgery

## 2019-04-20 ENCOUNTER — Other Ambulatory Visit: Payer: Self-pay

## 2019-04-20 DIAGNOSIS — M1611 Unilateral primary osteoarthritis, right hip: Secondary | ICD-10-CM

## 2019-04-20 NOTE — Progress Notes (Signed)
Subjective: Patient is here for ultrasound-guided intra-articular right hip injection.   Thigh pain with activity.  Objective:  Pain with passive IR.  Walks with a limp.  Procedure: Ultrasound-guided right hip injection: After sterile prep with Betadine, injected 8 cc 1% lidocaine without epinephrine and 40 mg methylprednisolone using a 22-gauge spinal needle, passing the needle through the iliofemoral ligament into the femoral head/neck junction.  Injectate seen filling capsule.  Very good immediate relief.

## 2019-04-20 NOTE — Progress Notes (Signed)
Office Visit Note   Patient: Marvin [name]           Date of Birth: 1951/02/12           MRN: [id number] Visit Date: 04/20/2019              Requested by: Flossie Buffy, NP [place],  [place] [address] PCP: Flossie Buffy, NP   Assessment & Plan: Visit Diagnoses:  1. Unilateral primary osteoarthritis, right hip     Plan: Impression is advanced generative joint disease right hip.  We have discussed cortisone injection versus surgical intervention to include right total hip replacement.  The patient would like to proceed with cortisone injection.  We will refer him to Dr. Junius Roads for this.  He will follow-up with [place] as needed.  Follow-Up Instructions: Return if symptoms worsen or fail to improve.   Orders:  No orders of the defined types were placed in this encounter.  No orders of the defined types were placed in this encounter.     Procedures: No procedures performed   Clinical Data: No additional findings.   Subjective: No chief complaint on file.   HPI patient is a pleasant 69 year old gentleman who comes in today with right hip pain.  He has had this for little over a year.  The pain he has is to the right groin, anterior and lateral thigh as well as into the knee.  No known injury or change in activity.  He does work for [employer] where he stands for long periods of time on concrete floors.  His pain has worsened recently.  His pain is aggravated with walking.  He has pain at night as well.  He has been taking meloxicam and Tylenol without significant relief of symptoms.  He does note occasional burning to the medial knee.  No previous hip injection.  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  He was recently seen by Su Grand, PA at [facility] where x-rays of the right hip and right knee were obtained.  It was suggested that he undergo right total hip replacement.  Review of Systems as detailed in  HPI.  All other reviewed and are negative.   Objective: Vital Signs: There were no vitals taken for this visit.  Physical Exam well-developed well-nourished gentleman in no acute distress.  Alert and oriented x3.  Ortho Exam examination of his right hip reveals a significantly positive logroll and FADIR.  Negative straight leg raise.  Right knee exam shows range of motion from 0 to 120 degrees.  He is stable valgus varus stress.  There is no effusion.  He is neurovascularly intact distally.  Specialty Comments:  No specialty comments available.  Imaging: X-rays reviewed by me on CD brought in by the patient show advanced degenerative changes to the right hip.   PMFS History: Patient Active Problem List   Diagnosis Date Noted  . Chronic bilateral low back pain without sciatica 02/21/2019  . Type 2 diabetes mellitus without complication, without long-term current use of insulin ([place]) 08/25/2017  . Gastroesophageal reflux disease 02/23/2017  . Gout 05/20/2016  . HTN (hypertension) 09/11/2015  . Hyperlipidemia 09/11/2015   Past Medical History:  Diagnosis Date  . Alcohol abuse   . Allergy   . Arthritis   . Drug abuse ([place])   . Gout   . Hernia   . Hypercholesteremia   . Hypertension   . Wears dentures  bottom front    Family History  Problem Relation Age of Onset  . Healthy Mother   . Healthy Father   . Cancer Brother        bone cancer  . Cancer Maternal Aunt        breast cancer  . Cancer Maternal Uncle        lukemia  . Cancer Cousin        unknown  . Cancer Maternal Uncle        lukemia  . Cancer Paternal Uncle        prostate  . Hearing loss Maternal Grandfather 48    Past Surgical History:  Procedure Laterality Date  . HERNIA REPAIR    . REPLACEMENT TOTAL KNEE     partial  . SHOULDER SURGERY     right   Social History   Occupational History  . Not on file  Tobacco Use  . Smoking status: Former Smoker    Types: Cigarettes  . Smokeless  tobacco: Former Neurosurgeon    Types: Chew  Substance and Sexual Activity  . Alcohol use: No  . Drug use: No  . Sexual activity: Yes    Birth control/protection: Condom

## 2019-04-28 ENCOUNTER — Ambulatory Visit: Payer: Medicare Other | Admitting: Orthopaedic Surgery

## 2019-07-07 ENCOUNTER — Other Ambulatory Visit: Payer: Self-pay | Admitting: Nurse Practitioner

## 2019-07-07 DIAGNOSIS — J301 Allergic rhinitis due to pollen: Secondary | ICD-10-CM

## 2019-07-19 NOTE — Progress Notes (Deleted)
Subjective:   Marvin [name] is a 69 y.o. male who presents for Medicare Annual/Subsequent preventive examination.  Review of Systems:  ***       Objective:    Vitals: There were no vitals taken for this visit.  There is no height or weight on file to calculate BMI.  Advanced Directives 04/03/2015  Does Patient Have a Medical Advance Directive? No    Tobacco Social History   Tobacco Use  Smoking Status Former Smoker  . Types: Cigarettes  Smokeless Tobacco Former Neurosurgeon  . Types: Chew     Counseling given: Not Answered   Clinical Intake:                       Past Medical History:  Diagnosis Date  . Alcohol abuse   . Allergy   . Arthritis   . Drug abuse (HCC)   . Gout   . Hernia   . Hypercholesteremia   . Hypertension   . Wears dentures    bottom front   Past Surgical History:  Procedure Laterality Date  . HERNIA REPAIR    . REPLACEMENT TOTAL KNEE     partial  . SHOULDER SURGERY     right   Family History  Problem Relation Age of Onset  . Healthy Mother   . Healthy Father   . Cancer Brother        bone cancer  . Cancer Maternal Aunt        breast cancer  . Cancer Maternal Uncle        lukemia  . Cancer Cousin        unknown  . Cancer Maternal Uncle        lukemia  . Cancer Paternal Uncle        prostate  . Hearing loss Maternal Grandfather 79   Social History   Socioeconomic History  . Marital status: Married    Spouse name: Not on file  . Number of children: Not on file  . Years of education: Not on file  . Highest education level: Not on file  Occupational History  . Not on file  Tobacco Use  . Smoking status: Former Smoker    Types: Cigarettes  . Smokeless tobacco: Former Neurosurgeon    Types: Chew  Substance and Sexual Activity  . Alcohol use: No  . Drug use: No  . Sexual activity: Yes    Birth control/protection: Condom  Other Topics Concern  . Not on file  Social History Narrative  . Not on file   Social  Determinants of Health   Financial Resource Strain:   . Difficulty of Paying Living Expenses:   Food Insecurity:   . Worried About Programme researcher, broadcasting/film/video in the Last Year:   . Barista in the Last Year:   Transportation Needs:   . Freight forwarder (Medical):   Marland Kitchen Lack of Transportation (Non-Medical):   Physical Activity:   . Days of Exercise per Week:   . Minutes of Exercise per Session:   Stress:   . Feeling of Stress :   Social Connections:   . Frequency of Communication with Friends and Family:   . Frequency of Social Gatherings with Friends and Family:   . Attends Religious Services:   . Active Member of Clubs or Organizations:   . Attends Banker Meetings:   Marland Kitchen Marital Status:     Outpatient Encounter Medications as of  07/20/2019  Medication Sig  . allopurinol (ZYLOPRIM) 100 MG tablet Take 1 tablet (100 mg total) by mouth 2 (two) times daily.  Marland Kitchen amLODipine (NORVASC) 5 MG tablet Take 1 tablet (5 mg total) by mouth every morning.  Marland Kitchen aspirin EC 81 MG tablet Take 81 mg by mouth every morning.  . cyclobenzaprine (FLEXERIL) 10 MG tablet Take 0.5 tablets (5 mg total) by mouth at bedtime as needed for muscle spasms.  . Dentifrices (FLUORIDE TOOTHPASTE DT) Place onto teeth.  . fluticasone (FLONASE) 50 MCG/ACT nasal spray USE TWO SPRAYS INTO EACH NOSTRIL DAILY AS DIRECTED  . meloxicam (MOBIC) 15 MG tablet Take 1 tablet (15 mg total) by mouth daily.  . pantoprazole (PROTONIX) 20 MG tablet Take 1 tablet (20 mg total) by mouth daily as needed.  . simvastatin (ZOCOR) 20 MG tablet Take 1 tablet (20 mg total) by mouth 3 (three) times a week. Take on Monday, Wednesday, and Friday  . Spacer/Aero-Holding Chambers (AEROCHAMBER PLUS WITH MASK) inhaler Use as instructed   No facility-administered encounter medications on file as of 07/20/2019.    Activities of Daily Living No flowsheet data found.  Patient Care Team: Nche, Charlene Brooke, NP as PCP - General (Internal  Medicine)   Assessment:   This is a routine wellness examination for [place].  Exercise Activities and Dietary recommendations    Goals    .  Acknowledge receipt of Advanced Directive package     He plans to have discussion with wife first    . Client understands the importance of follow-up with providers by attending scheduled visits     For DM, Gout, Hyperlipidemia, and HTN    . Client will report abillity to obtain Medications within ***    . Client will verbalize knowledge of self management of Hypertension as evidences by BP reading of 140/90 or less; or as defined by provider    . DIET - REDUCE SODIUM INTAKE    . Obtain Annual Eye (retinal)  Exam        Fall Risk Fall Risk  02/21/2019 02/24/2018 02/23/2017  Falls in the past year? 0 0 No   Is the patient's home free of loose throw rugs in walkways, pet beds, electrical cords, etc?   {Blank single:19197::"yes","no"}      Grab bars in the bathroom? {Blank single:19197::"yes","no"}      Handrails on the stairs?   {Blank single:19197::"yes","no"}      Adequate lighting?   {Blank single:19197::"yes","no"}  Timed Get Up and Go Performed: ***  Depression Screen PHQ 2/9 Scores 02/21/2019 02/24/2018 02/23/2017  PHQ - 2 Score 0 0 0    Cognitive Function        Immunization History  Administered Date(s) Administered  . Influenza, High Dose Seasonal PF 12/28/2016, 10/14/2018  . Influenza,inj,Quad PF,6+ Mos 11/27/2016  . Influenza-Unspecified 11/12/2015, 11/17/2017  . Pneumococcal Conjugate-13 02/24/2018  . Pneumococcal Polysaccharide-23 10/14/2018  . Tdap 08/24/2017  . Zoster 12/06/2013    Qualifies for Shingles Vaccine? ***  Screening Tests Health Maintenance  Topic Date Due  . COVID-19 Vaccine (1) Never done  . OPHTHALMOLOGY EXAM  10/30/2018  . URINE MICROALBUMIN  08/03/2019  . FOOT EXAM  08/03/2019  . HEMOGLOBIN A1C  08/29/2019  . INFLUENZA VACCINE  09/18/2019  . COLONOSCOPY  07/29/2027  . TETANUS/TDAP  08/25/2027    . Hepatitis C Screening  Completed  . PNA vac Low Risk Adult  Completed   Cancer Screenings: Lung: Low Dose CT Chest recommended if Age 41-80  years, 30 pack-year currently smoking OR have quit w/in 15years. Patient {DOES NOT does:27190::"does not"} qualify. Colorectal: ***  Additional Screenings: *** Hepatitis C Screening:      Plan:   ***  I have personally reviewed and noted the following in the patient's chart:   . Medical and social history . Use of alcohol, tobacco or illicit drugs  . Current medications and supplements . Functional ability and status . Nutritional status . Physical activity . Advanced directives . List of other physicians . Hospitalizations, surgeries, and ER visits in previous 12 months . Vitals . Screenings to include cognitive, depression, and falls . Referrals and appointments  In addition, I have reviewed and discussed with patient certain preventive protocols, quality metrics, and best practice recommendations. A written personalized care plan for preventive services as well as general preventive health recommendations were provided to patient.     [place], [place]  07/19/2019

## 2019-07-20 ENCOUNTER — Ambulatory Visit: Payer: Medicare HMO | Admitting: *Deleted

## 2019-08-08 ENCOUNTER — Other Ambulatory Visit: Payer: Self-pay | Admitting: Nurse Practitioner

## 2019-08-08 DIAGNOSIS — E790 Hyperuricemia without signs of inflammatory arthritis and tophaceous disease: Secondary | ICD-10-CM

## 2019-08-08 DIAGNOSIS — I10 Essential (primary) hypertension: Secondary | ICD-10-CM

## 2019-08-08 DIAGNOSIS — K219 Gastro-esophageal reflux disease without esophagitis: Secondary | ICD-10-CM

## 2019-08-09 ENCOUNTER — Other Ambulatory Visit: Payer: Self-pay

## 2019-08-09 NOTE — Progress Notes (Signed)
Subjective:   Marvin [name] is a 69 y.o. male who presents for Medicare Annual/Subsequent preventive examination.  Review of Systems:   Cardiac Risk Factors include: advanced age (>66men, >65 women);diabetes mellitus;dyslipidemia;male gender;obesity (BMI >30kg/m2)     Objective:    Vitals: BP 138/88 (BP Location: Right Arm, Patient Position: Sitting, Cuff Size: Normal)   Pulse 70   Temp 97.8 F (36.6 C) (Temporal)   Resp 16   Ht 6' (1.829 m)   Wt 248 lb (112.5 kg)   SpO2 97%   BMI 33.63 kg/m   Body mass index is 33.63 kg/m.  Advanced Directives 08/10/2019 04/03/2015  Does Patient Have a Medical Advance Directive? No No  Would patient like information on creating a medical advance directive? Yes (ED - Information included in AVS) -    Tobacco Social History   Tobacco Use  Smoking Status Former Smoker  . Types: Cigarettes  . Quit date: 03/20/1998  . Years since quitting: 21.4  Smokeless Tobacco Former Systems developer  . Types: Chew     Counseling given: Not Answered   Clinical Intake:  Pre-visit preparation completed: Yes  Pain : 0-10 Pain Score: 8  Pain Location: Hip Pain Orientation: Right Pain Onset: More than a month ago Pain Frequency: Constant Pain Relieving Factors: Meloxicam, Tylenol  Pain Relieving Factors: Meloxicam, Tylenol  Nutritional Status: BMI > 30  Obese Nutritional Risks: None Diabetes: Yes CBG done?: No Did pt. bring in CBG monitor from home?: No  How often do you need to have someone help you when you read instructions, pamphlets, or other written materials from your doctor or pharmacy?: 1 - Never What is the last grade level you completed in school?: Associate's degree  Nutrition Risk Assessment:  Has the patient had any N/V/D within the last 2 months?  No  Does the patient have any non-healing wounds?  No  Has the patient had any unintentional weight loss or weight gain?  No   Diabetes:  Is the patient diabetic?  Yes  If diabetic,  was a CBG obtained today?  No  Did the patient bring in their glucometer from home?  No  How often do you monitor your CBG's? never.   Financial Strains and Diabetes Management:  Are you having any financial strains with the device, your supplies or your medication? No .  Does the patient want to be seen by Chronic Care Management for management of their diabetes?  No  Would the patient like to be referred to a Nutritionist or for Diabetic Management?  No   Diabetic Exams:  Diabetic Eye Exam: Completed 10/29/2017. Overdue for diabetic eye exam. Pt has been advised about the importance in completing this exam. Patient states he plans to make an appointment soon.  Diabetic Foot Exam: Completed 08/03/2018. To be completed by PCP at next office visit.  Interpreter Needed?: No  Information entered by :: Caroleen Hamman LPN  Past Medical History:  Diagnosis Date  . Alcohol abuse   . Allergy   . Arthritis   . Drug abuse ([place])   . Gout   . Hernia   . Hypercholesteremia   . Hypertension   . Wears dentures    bottom front   Past Surgical History:  Procedure Laterality Date  . HERNIA REPAIR    . REPLACEMENT TOTAL KNEE     partial  . SHOULDER SURGERY     right   Family History  Problem Relation Age of Onset  . Healthy Mother   .  Healthy Father   . Cancer Brother        bone cancer  . Cancer Maternal Aunt        breast cancer  . Cancer Maternal Uncle        lukemia  . Cancer Cousin        unknown  . Cancer Maternal Uncle        lukemia  . Cancer Paternal Uncle        prostate  . Hearing loss Maternal Grandfather 60   Social History   Socioeconomic History  . Marital status: Married    Spouse name: Not on file  . Number of children: Not on file  . Years of education: Not on file  . Highest education level: Not on file  Occupational History  . Not on file  Tobacco Use  . Smoking status: Former Smoker    Types: Cigarettes    Quit date: 03/20/1998    Years since  quitting: 21.4  . Smokeless tobacco: Former Neurosurgeon    Types: Engineer, drilling  . Vaping Use: Never used  Substance and Sexual Activity  . Alcohol use: No  . Drug use: No  . Sexual activity: Yes    Birth control/protection: Condom  Other Topics Concern  . Not on file  Social History Narrative  . Not on file   Social Determinants of Health   Financial Resource Strain:   . Difficulty of Paying Living Expenses:   Food Insecurity:   . Worried About Programme researcher, broadcasting/film/video in the Last Year:   . Barista in the Last Year:   Transportation Needs:   . Freight forwarder (Medical):   Marland Kitchen Lack of Transportation (Non-Medical):   Physical Activity:   . Days of Exercise per Week:   . Minutes of Exercise per Session:   Stress:   . Feeling of Stress :   Social Connections:   . Frequency of Communication with Friends and Family:   . Frequency of Social Gatherings with Friends and Family:   . Attends Religious Services:   . Active Member of Clubs or Organizations:   . Attends Banker Meetings:   Marland Kitchen Marital Status:     Outpatient Encounter Medications as of 08/10/2019  Medication Sig  . allopurinol (ZYLOPRIM) 100 MG tablet Take 1 tablet (100 mg total) by mouth 2 (two) times daily. Needs to schedule f/up appt  . amLODipine (NORVASC) 5 MG tablet Take 1 tablet (5 mg total) by mouth every morning. Needs to schedule f/up appt  . aspirin EC 81 MG tablet Take 81 mg by mouth every morning.  . cyclobenzaprine (FLEXERIL) 10 MG tablet Take 0.5 tablets (5 mg total) by mouth at bedtime as needed for muscle spasms.  . Dentifrices (FLUORIDE TOOTHPASTE DT) Place onto teeth.  . fluticasone (FLONASE) 50 MCG/ACT nasal spray USE TWO SPRAYS INTO EACH NOSTRIL DAILY AS DIRECTED  . meloxicam (MOBIC) 15 MG tablet Take 1 tablet (15 mg total) by mouth daily.  . pantoprazole (PROTONIX) 20 MG tablet Take 1 tablet (20 mg total) by mouth daily as needed.  Marland Kitchen Spacer/Aero-Holding Chambers (AEROCHAMBER PLUS  WITH MASK) inhaler Use as instructed  . simvastatin (ZOCOR) 20 MG tablet Take 1 tablet (20 mg total) by mouth 3 (three) times a week. Take on Monday, Wednesday, and Friday (Patient not taking: Reported on 08/10/2019)  . [DISCONTINUED] pantoprazole (PROTONIX) 20 MG tablet Take 1 tablet (20 mg total) by mouth daily. Needs to  schedule f/up appt   No facility-administered encounter medications on file as of 08/10/2019.    Activities of Daily Living In your present state of health, do you have any difficulty performing the following activities: 08/10/2019  Hearing? N  Vision? N  Difficulty concentrating or making decisions? N  Walking or climbing stairs? N  Dressing or bathing? N  Doing errands, shopping? N  Preparing Food and eating ? N  Using the Toilet? N  In the past six months, have you accidently leaked urine? N  Do you have problems with loss of bowel control? N  Managing your Medications? N  Managing your Finances? N  Housekeeping or managing your Housekeeping? N  Some recent data might be hidden    Patient Care Team: Nche, Bonna Gains, NP as PCP - General (Internal Medicine)   Assessment:   This is a routine wellness examination for [place].  Exercise Activities and Dietary recommendations Current Exercise Habits: The patient has a physically strenuous job, but has no regular exercise apart from work., Exercise limited by: orthopedic condition(s)  Goals Addressed            This Visit's Progress   . Patient Stated       Eat a healthier diet, increase excercise       Fall Risk Fall Risk  08/10/2019 02/21/2019 02/24/2018 02/23/2017  Falls in the past year? 0 0 0 No  Number falls in past yr: 0 - - -  Injury with Fall? 0 - - -  Risk for fall due to : No Fall Risks - - -  Follow up Falls prevention discussed - - -   FALL RISK PREVENTION PERTAINING TO THE HOME:  Any stairs in or around the home? No    Home free of loose throw rugs in walkways, pet beds, electrical  cords, etc? Yes  Adequate lighting in your home to reduce risk of falls? Yes   ASSISTIVE DEVICES UTILIZED TO PREVENT FALLS:  Life alert? No  Use of a cane, walker or w/c? No  Grab bars in the bathroom? Yes  Shower chair or bench in shower? No  Elevated toilet seat or a handicapped toilet? No    TIMED UP AND GO:  Was the test performed? Yes .  Length of time to ambulate 10 feet: 9 sec.   GAIT:  Appearance of gait:Gait slow and steady without the use of an assistive device.  Education: Fall risk prevention has been discussed.  Intervention(s) required? No   DME/home health order needed?  No    Depression Screen PHQ 2/9 Scores 08/10/2019 02/21/2019 02/24/2018 02/23/2017  PHQ - 2 Score 0 0 0 0    Cognitive Function     6CIT Screen 08/10/2019  What Year? 0 points  What month? 0 points  What time? 0 points  Count back from 20 0 points  Months in reverse 0 points  Repeat phrase 0 points  Total Score 0    Immunization History  Administered Date(s) Administered  . Influenza, High Dose Seasonal PF 12/28/2016, 10/14/2018  . Influenza,inj,Quad PF,6+ Mos 11/27/2016  . Influenza-Unspecified 11/12/2015, 11/17/2017  . Pneumococcal Conjugate-13 02/24/2018  . Pneumococcal Polysaccharide-23 10/14/2018  . Tdap 08/24/2017  . Zoster 12/06/2013    Qualifies for Shingles Vaccine? Yes  Zostavax completed 12/06/2013 . Due for Shingrix. Education has been provided regarding the importance of this vaccine. Pt has been advised to call insurance company to determine out of pocket expense. Advised may also receive vaccine at local  pharmacy or Health Dept. Verbalized acceptance and understanding.  Tdap:  Completed 08/24/2017  Flu Vaccine: Due 10/2019  Pneumococcal Vaccine: Prevnar-13 completed 02/24/2018 Pneumovax-23 completed 10/14/2018  Covid-19 Vaccine:  Completed vaccines  Screening Tests Health Maintenance  Topic Date Due  . COVID-19 Vaccine (1) Never done  . OPHTHALMOLOGY EXAM   10/30/2018  . FOOT EXAM  08/03/2019  . URINE MICROALBUMIN  08/03/2019  . HEMOGLOBIN A1C  08/29/2019  . INFLUENZA VACCINE  09/18/2019  . COLONOSCOPY  07/29/2027  . TETANUS/TDAP  08/25/2027  . Hepatitis C Screening  Completed  . PNA vac Low Risk Adult  Completed   Cancer Screenings:  Colorectal Screening: Completed 07/28/2017. Repeat every 10 years.  Lung Cancer Screening: (Low Dose CT Chest recommended if Age 42-80 years, 30 pack-year currently smoking OR have quit w/in 15years.) does not qualify.    Additional Screening:  Hepatitis C Screening:  Completed 08/24/2017  Dental Screening: Recommended annual dental exams for proper oral hygiene  Community Resource Referral:  CRR required this visit?  No        Plan:  I have personally reviewed and addressed the Medicare Annual Wellness questionnaire and have noted the following in the patient's chart:  A. Medical and social history B. Use of alcohol, tobacco or illicit drugs  C. Current medications and supplements D. Functional ability and status E.  Nutritional status F.  Physical activity G. Advance directives H. List of other physicians I.  Hospitalizations, surgeries, and ER visits in previous 12 months J.  Vitals K. Screenings such as hearing and vision if needed, cognitive and depression L. Referrals and appointments   In addition, I have reviewed and discussed with patient certain preventive protocols, quality metrics, and best practice recommendations. A written personalized care plan for preventive services as well as general preventive health recommendations were provided to patient.   Patient to access AVS via my chart.   Signed,   Roanna Raider, LPN  10/18/5174 Nurse Health Advisor  Nurse Notes: None

## 2019-08-10 ENCOUNTER — Ambulatory Visit: Payer: Medicare HMO | Admitting: *Deleted

## 2019-08-10 ENCOUNTER — Other Ambulatory Visit: Payer: Self-pay | Admitting: Nurse Practitioner

## 2019-08-10 ENCOUNTER — Telehealth: Payer: Self-pay | Admitting: Nurse Practitioner

## 2019-08-10 ENCOUNTER — Ambulatory Visit (INDEPENDENT_AMBULATORY_CARE_PROVIDER_SITE_OTHER): Payer: Medicare HMO

## 2019-08-10 VITALS — BP 138/88 | HR 70 | Temp 97.8°F | Resp 16 | Ht 72.0 in | Wt 248.0 lb

## 2019-08-10 DIAGNOSIS — Z0000 Encounter for general adult medical examination without abnormal findings: Secondary | ICD-10-CM | POA: Diagnosis not present

## 2019-08-10 DIAGNOSIS — J301 Allergic rhinitis due to pollen: Secondary | ICD-10-CM

## 2019-08-10 NOTE — Telephone Encounter (Signed)
[place] please advise.  Handicap placard forms was received and left on your desk to review.

## 2019-08-10 NOTE — Patient Instructions (Signed)
Marvin [name] , Thank you for taking time to come for your Medicare Wellness Visit. I appreciate your ongoing commitment to your health goals. Please review the following plan we discussed and let me know if I can assist you in the future.   Screening recommendations/referrals: Colonoscopy: Completed 07/28/2017-Due 07/29/2027 Recommended yearly ophthalmology/optometry visit for glaucoma screening and checkup Recommended yearly dental visit for hygiene and checkup  Vaccinations: Influenza vaccine: Up to date-Due-10/2019 Pneumococcal vaccine: Completed vaccines Tdap vaccine: Up to date-08/24/2017-Next due-08/24/2027 Shingles vaccine: Discuss with pharmacy   Covid-19: Completed vaccines  Advanced directives: Discussed. Information given.  Conditions/risks identified: See problem list  Next appointment: Follow up in one year for your annual wellness visit. 08/15/2020 @ 9am  Preventive Care 65 Years and Older, Male Preventive care refers to lifestyle choices and visits with your health care provider that can promote health and wellness. What does preventive care include?  A yearly physical exam. This is also called an annual well check.  Dental exams once or twice a year.  Routine eye exams. Ask your health care provider how often you should have your eyes checked.  Personal lifestyle choices, including:  Daily care of your teeth and gums.  Regular physical activity.  Eating a healthy diet.  Avoiding tobacco and drug use.  Limiting alcohol use.  Practicing safe sex.  Taking low doses of aspirin every day.  Taking vitamin and mineral supplements as recommended by your health care provider. What happens during an annual well check? The services and screenings done by your health care provider during your annual well check will depend on your age, overall health, lifestyle risk factors, and family history of disease. Counseling  Your health care provider may ask you questions about  your:  Alcohol use.  Tobacco use.  Drug use.  Emotional well-being.  Home and relationship well-being.  Sexual activity.  Eating habits.  History of falls.  Memory and ability to understand (cognition).  Work and work Astronomer. Screening  You may have the following tests or measurements:  Height, weight, and BMI.  Blood pressure.  Lipid and cholesterol levels. These may be checked every 5 years, or more frequently if you are over 70 years old.  Skin check.  Lung cancer screening. You may have this screening every year starting at age 8 if you have a 30-pack-year history of smoking and currently smoke or have quit within the past 15 years.  Fecal occult blood test (FOBT) of the stool. You may have this test every year starting at age 54.  Flexible sigmoidoscopy or colonoscopy. You may have a sigmoidoscopy every 5 years or a colonoscopy every 10 years starting at age 75.  Prostate cancer screening. Recommendations will vary depending on your family history and other risks.  Hepatitis C blood test.  Hepatitis B blood test.  Sexually transmitted disease (STD) testing.  Diabetes screening. This is done by checking your blood sugar (glucose) after you have not eaten for a while (fasting). You may have this done every 1-3 years.  Abdominal aortic aneurysm (AAA) screening. You may need this if you are a current or former smoker.  Osteoporosis. You may be screened starting at age 45 if you are at high risk. Talk with your health care provider about your test results, treatment options, and if necessary, the need for more tests. Vaccines  Your health care provider may recommend certain vaccines, such as:  Influenza vaccine. This is recommended every year.  Tetanus, diphtheria, and acellular pertussis (Tdap,  Td) vaccine. You may need a Td booster every 10 years.  Zoster vaccine. You may need this after age 83.  Pneumococcal 13-valent conjugate (PCV13) vaccine.  One dose is recommended after age 9.  Pneumococcal polysaccharide (PPSV23) vaccine. One dose is recommended after age 72. Talk to your health care provider about which screenings and vaccines you need and how often you need them. This information is not intended to replace advice given to you by your health care provider. Make sure you discuss any questions you have with your health care provider. Document Released: 03/02/2015 Document Revised: 10/24/2015 Document Reviewed: 12/05/2014 Elsevier Interactive Patient Education  2017 [employer].  Fall Prevention in the Home Falls can cause injuries. They can happen to people of all ages. There are many things you can do to make your home safe and to help prevent falls. What can I do on the outside of my home?  Regularly fix the edges of walkways and driveways and fix any cracks.  Remove anything that might make you trip as you walk through a door, such as a raised step or threshold.  Trim any bushes or trees on the path to your home.  Use bright outdoor lighting.  Clear any walking paths of anything that might make someone trip, such as rocks or tools.  Regularly check to see if handrails are loose or broken. Make sure that both sides of any steps have handrails.  Any raised decks and porches should have guardrails on the edges.  Have any leaves, snow, or ice cleared regularly.  Use sand or salt on walking paths during winter.  Clean up any spills in your garage right away. This includes oil or grease spills. What can I do in the bathroom?  Use night lights.  Install grab bars by the toilet and in the tub and shower. Do not use towel bars as grab bars.  Use non-skid mats or decals in the tub or shower.  If you need to sit down in the shower, use a plastic, non-slip stool.  Keep the floor dry. Clean up any water that spills on the floor as soon as it happens.  Remove soap buildup in the tub or shower regularly.  Attach bath  mats securely with double-sided non-slip rug tape.  Do not have throw rugs and other things on the floor that can make you trip. What can I do in the bedroom?  Use night lights.  Make sure that you have a light by your bed that is easy to reach.  Do not use any sheets or blankets that are too big for your bed. They should not hang down onto the floor.  Have a firm chair that has side arms. You can use this for support while you get dressed.  Do not have throw rugs and other things on the floor that can make you trip. What can I do in the kitchen?  Clean up any spills right away.  Avoid walking on wet floors.  Keep items that you use a lot in easy-to-reach places.  If you need to reach something above you, use a strong step stool that has a grab bar.  Keep electrical cords out of the way.  Do not use floor polish or wax that makes floors slippery. If you must use wax, use non-skid floor wax.  Do not have throw rugs and other things on the floor that can make you trip. What can I do with my stairs?  Do not  leave any items on the stairs.  Make sure that there are handrails on both sides of the stairs and use them. Fix handrails that are broken or loose. Make sure that handrails are as long as the stairways.  Check any carpeting to make sure that it is firmly attached to the stairs. Fix any carpet that is loose or worn.  Avoid having throw rugs at the top or bottom of the stairs. If you do have throw rugs, attach them to the floor with carpet tape.  Make sure that you have a light switch at the top of the stairs and the bottom of the stairs. If you do not have them, ask someone to add them for you. What else can I do to help prevent falls?  Wear shoes that:  Do not have high heels.  Have rubber bottoms.  Are comfortable and fit you well.  Are closed at the toe. Do not wear sandals.  If you use a stepladder:  Make sure that it is fully opened. Do not climb a closed  stepladder.  Make sure that both sides of the stepladder are locked into place.  Ask someone to hold it for you, if possible.  Clearly mark and make sure that you can see:  Any grab bars or handrails.  First and last steps.  Where the edge of each step is.  Use tools that help you move around (mobility aids) if they are needed. These include:  Canes.  Walkers.  Scooters.  Crutches.  Turn on the lights when you go into a dark area. Replace any light bulbs as soon as they burn out.  Set up your furniture so you have a clear path. Avoid moving your furniture around.  If any of your floors are uneven, fix them.  If there are any pets around you, be aware of where they are.  Review your medicines with your doctor. Some medicines can make you feel dizzy. This can increase your chance of falling. Ask your doctor what other things that you can do to help prevent falls. This information is not intended to replace advice given to you by your health care provider. Make sure you discuss any questions you have with your health care provider. Document Released: 11/30/2008 Document Revised: 07/12/2015 Document Reviewed: 03/10/2014 Elsevier Interactive Patient Education  2017 [employer].

## 2019-08-10 NOTE — Telephone Encounter (Signed)
Patient dropped off a form with the Nurse Health Advisor for a handicap placard. Placed in providers folder to be completed. Please call patient to let him know it is ready to be picked up once completed.

## 2019-08-11 ENCOUNTER — Telehealth: Payer: Self-pay

## 2019-08-11 DIAGNOSIS — Z0279 Encounter for issue of other medical certificate: Secondary | ICD-10-CM

## 2019-08-11 NOTE — Telephone Encounter (Signed)
What is reason for handicap placard?

## 2019-08-11 NOTE — Telephone Encounter (Signed)
[place] please advise.  Pt said that his mobility has decreased since his hip problems/injections and states he is getting the placard for the knee replacement an hip replacement.

## 2019-08-11 NOTE — Telephone Encounter (Signed)
Pt was notified of handicap placard was completed and left up front for him to pick up.

## 2019-08-12 NOTE — Telephone Encounter (Signed)
Form completed and signed

## 2019-08-12 NOTE — Telephone Encounter (Signed)
Forms signed and left up front an pt was notified and said he would pick up today 08/12/19.

## 2019-08-15 DIAGNOSIS — Z0100 Encounter for examination of eyes and vision without abnormal findings: Secondary | ICD-10-CM | POA: Diagnosis not present

## 2019-08-15 DIAGNOSIS — H524 Presbyopia: Secondary | ICD-10-CM | POA: Diagnosis not present

## 2019-08-15 LAB — HM DIABETES EYE EXAM

## 2019-08-30 ENCOUNTER — Encounter: Payer: Self-pay | Admitting: Orthopaedic Surgery

## 2019-08-30 ENCOUNTER — Ambulatory Visit (INDEPENDENT_AMBULATORY_CARE_PROVIDER_SITE_OTHER): Payer: Managed Care, Other (non HMO) | Admitting: Orthopaedic Surgery

## 2019-08-30 ENCOUNTER — Ambulatory Visit (INDEPENDENT_AMBULATORY_CARE_PROVIDER_SITE_OTHER): Payer: Managed Care, Other (non HMO)

## 2019-08-30 VITALS — Ht 71.0 in | Wt 252.0 lb

## 2019-08-30 DIAGNOSIS — M1611 Unilateral primary osteoarthritis, right hip: Secondary | ICD-10-CM

## 2019-08-30 MED ORDER — TRAMADOL HCL 50 MG PO TABS: 50.0000 mg | ORAL_TABLET | Freq: Every day | ORAL | 0 refills | Status: DC | PRN

## 2019-08-30 NOTE — Progress Notes (Signed)
Office Visit Note   Patient: Marvin [name]           Date of Birth: 07-14-1950           MRN: [id number] Visit Date: 08/30/2019              Requested by: Anne Ng, NP [address] [place],  [place] [address] PCP: Anne Ng, NP   Assessment & Plan: Visit Diagnoses:  1. Primary osteoarthritis of right hip     Plan: Impression is end-stage right hip DJD.  After consideration of his options patient would like to move forward with scheduling a right total hip replacement.  Risk benefits rehab recovery reviewed in detail including infection, DVT, dislocation, leg length discrepancy.  He would like to have this done ASAP.  Tramadol prescribed today.  Follow-Up Instructions: Return if symptoms worsen or fail to improve.   Orders:  Orders Placed This Encounter  Procedures  . XR HIP UNILAT W OR W/O PELVIS 2-3 VIEWS RIGHT   Meds ordered this encounter  Medications  . traMADol (ULTRAM) 50 MG tablet    Sig: Take 1-2 tablets (50-100 mg total) by mouth daily as needed.    Dispense:  20 tablet    Refill:  0      Procedures: No procedures performed   Clinical Data: No additional findings.   Subjective: Chief Complaint  Patient presents with  . Right Hip - Pain    Bhavya is a very pleasant 69 year old gentleman who comes in for follow-up of a right hip DJD.  He had a cortisone injection in March with Dr. Prince Rome which did help but has worn off.  He mainly wanted to exhaust all conservative treatment before considering surgery.  He works part-time at [employer] and he feels that the hip pain is too excruciating and the injection just did not give him the relief that he was looking for.  He currently takes Tylenol and meloxicam for the pain.  Denies history of diabetes.  His right hip is affecting his quality of life and his ability to work and perform ADLs.   Review of Systems  Constitutional: Negative.   All other systems reviewed and are  negative.    Objective: Vital Signs: Ht 5\' 11"  (1.803 m)   Wt 252 lb (114.3 kg)   BMI 35.15 kg/m   Physical Exam Vitals and nursing note reviewed.  Constitutional:      Appearance: He is well-developed.  Pulmonary:     Effort: Pulmonary effort is normal.  Abdominal:     Palpations: Abdomen is soft.  Skin:    General: Skin is warm.  Neurological:     Mental Status: He is alert and oriented to person, place, and time.  Psychiatric:        Behavior: Behavior normal.        Thought Content: Thought content normal.        Judgment: Judgment normal.     Ortho Exam Right hip shows limited internal and external rotation secondary to pain.  Positive Stinchfield sign.  Exam is unchanged otherwise. Specialty Comments:  No specialty comments available.  Imaging: XR HIP UNILAT W OR W/O PELVIS 2-3 VIEWS RIGHT  Result Date: 08/30/2019 Advanced right hip DJD    PMFS History: Patient Active Problem List   Diagnosis Date Noted  . Chronic bilateral low back pain without sciatica 02/21/2019  . Gastroesophageal reflux disease 02/23/2017  . Gout 05/20/2016  . HTN (hypertension) 09/11/2015  .  Hyperlipidemia 09/11/2015   Past Medical History:  Diagnosis Date  . Alcohol abuse   . Allergy   . Arthritis   . Drug abuse (HCC)   . Gout   . Hernia   . Hypercholesteremia   . Hypertension   . Wears dentures    bottom front    Family History  Problem Relation Age of Onset  . Healthy Mother   . Healthy Father   . Cancer Brother        bone cancer  . Cancer Maternal Aunt        breast cancer  . Cancer Maternal Uncle        lukemia  . Cancer Cousin        unknown  . Cancer Maternal Uncle        lukemia  . Cancer Paternal Uncle        prostate  . Hearing loss Maternal Grandfather 12    Past Surgical History:  Procedure Laterality Date  . HERNIA REPAIR    . REPLACEMENT TOTAL KNEE     partial  . SHOULDER SURGERY     right   Social History   Occupational History  .  Not on file  Tobacco Use  . Smoking status: Former Smoker    Types: Cigarettes    Quit date: 03/20/1998    Years since quitting: 21.4  . Smokeless tobacco: Former Neurosurgeon    Types: Engineer, drilling  . Vaping Use: Never used  Substance and Sexual Activity  . Alcohol use: No  . Drug use: No  . Sexual activity: Yes    Birth control/protection: Condom

## 2019-09-09 ENCOUNTER — Other Ambulatory Visit: Payer: Self-pay

## 2019-09-12 ENCOUNTER — Telehealth: Payer: Self-pay | Admitting: Orthopaedic Surgery

## 2019-09-12 NOTE — Telephone Encounter (Signed)
Yes that's fine 

## 2019-09-12 NOTE — Telephone Encounter (Signed)
Patient called requesting out of work doctor's note for upcoming surgery on Aug. 16, 2021. Patient would like the note starting date to begin 09/28/2119 date of Covid test and quarantine before surgery. Patient would like to pick not put note up as soon as possible to turn in to employer. Patient asked for a call back when note is ready for pick up.

## 2019-09-16 NOTE — Telephone Encounter (Signed)
How long would you like patient OOW for after SU?

## 2019-09-18 NOTE — Telephone Encounter (Signed)
6-12 weeks 

## 2019-09-19 ENCOUNTER — Telehealth: Payer: Self-pay | Admitting: Orthopaedic Surgery

## 2019-09-19 NOTE — Telephone Encounter (Signed)
Note made.  

## 2019-09-19 NOTE — Telephone Encounter (Signed)
Pt called checking on that note that was supposed to stating how long he'll be out of work pt would like a CB so he can come pick up.   [phone]

## 2019-09-19 NOTE — Telephone Encounter (Signed)
Ready for pick up. Patient aware.  

## 2019-09-19 NOTE — Telephone Encounter (Signed)
See other msg

## 2019-09-28 ENCOUNTER — Other Ambulatory Visit (HOSPITAL_COMMUNITY): Payer: Managed Care, Other (non HMO)

## 2019-09-29 NOTE — Patient Instructions (Signed)
DUE TO COVID-19 ONLY ONE VISITOR IS ALLOWED TO COME WITH YOU AND STAY IN THE WAITING ROOM ONLY DURING PRE OP AND PROCEDURE DAY OF SURGERY. THE 1 VISITOR  MAY VISIT WITH YOU AFTER SURGERY IN YOUR PRIVATE ROOM DURING VISITING HOURS ONLY!  YOU NEED TO HAVE A COVID 19 TEST ON: 09/30/19 @ 9:35 am THIS TEST MUST BE DONE BEFORE SURGERY,  COVID TESTING SITE [address] [place] [address], IT IS ON THE RIGHT GOING OUT [address] APPROXIMATELY  2 MINUTES PAST [employer] ON THE RIGHT. ONCE YOUR COVID TEST IS COMPLETED,  PLEASE BEGIN THE QUARANTINE INSTRUCTIONS AS OUTLINED IN YOUR HANDOUT.                Marvin [name]   Your procedure is scheduled on: 10/03/19   Report to [facility] Main  Entrance   Report to admitting at: 7:15 AM     Call this number if you have problems the morning of surgery [phone]    Remember:   NO SOLID FOOD AFTER MIDNIGHT THE NIGHT PRIOR TO SURGERY. NOTHING BY MOUTH EXCEPT CLEAR LIQUIDS UNTIL: 6:45 am . PLEASE FINISH ENSURE DRINK PER SURGEON ORDER  WHICH NEEDS TO BE COMPLETED AT: 6:45 am .  CLEAR LIQUID DIET   Foods Allowed                                                                     Foods Excluded  Coffee and tea, regular and decaf                             liquids that you cannot  Plain Jell-O any favor except red or purple                                           see through such as: Fruit ices (not with fruit pulp)                                     milk, soups, orange juice  Iced Popsicles                                    All solid food Carbonated beverages, regular and diet                                    Cranberry, grape and apple juices Sports drinks like Gatorade Lightly seasoned clear broth or consume(fat free) Sugar, honey syrup  Sample Menu Breakfast                                Lunch  Supper Cranberry juice                    Beef broth                             Chicken broth Jell-O                                     Grape juice                           Apple juice Coffee or tea                        Jell-O                                      Popsicle                                                Coffee or tea                        Coffee or tea  _____________________________________________________________________  BRUSH YOUR TEETH MORNING OF SURGERY AND RINSE YOUR MOUTH OUT, NO CHEWING GUM CANDY OR MINTS.     Take these medicines the morning of surgery with A SIP OF WATER: allopurinol,amlodipine,pantoprazole.Use inhaler and Flonase as usual.                                You may not have any metal on your body including hair pins and              piercings  Do not wear jewelry, lotions, powders or perfumes, deodorant             Men may shave face and neck.   Do not bring valuables to the hospital. [facility] IS NOT             RESPONSIBLE   FOR VALUABLES.  Contacts, dentures or bridgework may not be worn into surgery.  Leave suitcase in the car. After surgery it may be brought to your room.     Patients discharged the day of surgery will not be allowed to drive home. IF YOU ARE HAVING SURGERY AND GOING HOME THE SAME DAY, YOU MUST HAVE AN ADULT TO DRIVE YOU HOME AND BE WITH YOU FOR 24 HOURS. YOU MAY GO HOME BY TAXI OR UBER OR ORTHERWISE, BUT AN ADULT MUST ACCOMPANY YOU HOME AND STAY WITH YOU FOR 24 HOURS.  Name and phone number of your driver:  Special Instructions: N/A              Please read over the following fact sheets you were given: _____________________________________________________________________          [facility] - Preparing for Surgery Before surgery, you can play an important role.  Because skin is not sterile, your skin needs to be as free of germs as possible.  You can reduce the number of germs on your  skin by washing with CHG (chlorahexidine gluconate) soap before surgery.  CHG is an antiseptic cleaner which  kills germs and bonds with the skin to continue killing germs even after washing. Please DO NOT use if you have an allergy to CHG or antibacterial soaps.  If your skin becomes reddened/irritated stop using the CHG and inform your nurse when you arrive at Short Stay. Do not shave (including legs and underarms) for at least 48 hours prior to the first CHG shower.  You may shave your face/neck. Please follow these instructions carefully:  1.  Shower with CHG Soap the night before surgery and the  morning of Surgery.  2.  If you choose to wash your hair, wash your hair first as usual with your  normal  shampoo.  3.  After you shampoo, rinse your hair and body thoroughly to remove the  shampoo.                           4.  Use CHG as you would any other liquid soap.  You can apply chg directly  to the skin and wash                       Gently with a scrungie or clean washcloth.  5.  Apply the CHG Soap to your body ONLY FROM THE NECK DOWN.   Do not use on face/ open                           Wound or open sores. Avoid contact with eyes, ears mouth and genitals (private parts).                       Wash face,  Genitals (private parts) with your normal soap.             6.  Wash thoroughly, paying special attention to the area where your surgery  will be performed.  7.  Thoroughly rinse your body with warm water from the neck down.  8.  DO NOT shower/wash with your normal soap after using and rinsing off  the CHG Soap.                9.  Pat yourself dry with a clean towel.            10.  Wear clean pajamas.            11.  Place clean sheets on your bed the night of your first shower and do not  sleep with pets. Day of Surgery : Do not apply any lotions/deodorants the morning of surgery.  Please wear clean clothes to the hospital/surgery center.  FAILURE TO FOLLOW THESE INSTRUCTIONS MAY RESULT IN THE CANCELLATION OF YOUR SURGERY PATIENT SIGNATURE_________________________________  NURSE  SIGNATURE__________________________________  ________________________________________________________________________   Marvin [name]  An incentive spirometer is a tool that can help keep your lungs clear and active. This tool measures how well you are filling your lungs with each breath. Taking long deep breaths may help reverse or decrease the chance of developing breathing (pulmonary) problems (especially infection) following:  A long period of time when you are unable to move or be active. BEFORE THE PROCEDURE   If the spirometer includes an indicator to show your best effort, your nurse or respiratory therapist will set it to a desired goal.  If possible,  sit up straight or lean slightly forward. Try not to slouch.  Hold the incentive spirometer in an upright position. INSTRUCTIONS FOR USE  1. Sit on the edge of your bed if possible, or sit up as far as you can in bed or on a chair. 2. Hold the incentive spirometer in an upright position. 3. Breathe out normally. 4. Place the mouthpiece in your mouth and seal your lips tightly around it. 5. Breathe in slowly and as deeply as possible, raising the piston or the ball toward the top of the column. 6. Hold your breath for 3-5 seconds or for as long as possible. Allow the piston or ball to fall to the bottom of the column. 7. Remove the mouthpiece from your mouth and breathe out normally. 8. Rest for a few seconds and repeat Steps 1 through 7 at least 10 times every 1-2 hours when you are awake. Take your time and take a few normal breaths between deep breaths. 9. The spirometer may include an indicator to show your best effort. Use the indicator as a goal to work toward during each repetition. 10. After each set of 10 deep breaths, practice coughing to be sure your lungs are clear. If you have an incision (the cut made at the time of surgery), support your incision when coughing by placing a pillow or rolled up towels firmly  against it. Once you are able to get out of bed, walk around indoors and cough well. You may stop using the incentive spirometer when instructed by your caregiver.  RISKS AND COMPLICATIONS  Take your time so you do not get dizzy or light-headed.  If you are in pain, you may need to take or ask for pain medication before doing incentive spirometry. It is harder to take a deep breath if you are having pain. AFTER USE  Rest and breathe slowly and easily.  It can be helpful to keep track of a log of your progress. Your caregiver can provide you with a simple table to help with this. If you are using the spirometer at home, follow these instructions: [place] IF:   You are having difficultly using the spirometer.  You have trouble using the spirometer as often as instructed.  Your pain medication is not giving enough relief while using the spirometer.  You develop fever of 100.5 F (38.1 C) or higher. SEEK IMMEDIATE MEDICAL CARE IF:   You cough up bloody sputum that had not been present before.  You develop fever of 102 F (38.9 C) or greater.  You develop worsening pain at or near the incision site. MAKE SURE YOU:   Understand these instructions.  Will watch your condition.  Will get help right away if you are not doing well or get worse. Document Released: 06/16/2006 Document Revised: 04/28/2011 Document Reviewed: 08/17/2006 [facility] Patient Information 2014 [place], [place].   ________________________________________________________________________

## 2019-09-30 ENCOUNTER — Encounter (HOSPITAL_COMMUNITY)
Admission: RE | Admit: 2019-09-30 | Discharge: 2019-09-30 | Disposition: A | Discharge status: Home / Self Care | Payer: Managed Care, Other (non HMO) | Source: Ambulatory Visit | Attending: Orthopaedic Surgery | Admitting: Orthopaedic Surgery

## 2019-09-30 ENCOUNTER — Encounter (HOSPITAL_COMMUNITY): Payer: Self-pay

## 2019-09-30 ENCOUNTER — Other Ambulatory Visit (HOSPITAL_COMMUNITY)
Admission: RE | Admit: 2019-09-30 | Discharge: 2019-09-30 | Disposition: A | Discharge status: Home / Self Care | Payer: Managed Care, Other (non HMO) | Source: Ambulatory Visit | Attending: Orthopaedic Surgery | Admitting: Orthopaedic Surgery

## 2019-09-30 ENCOUNTER — Telehealth: Payer: Self-pay | Admitting: Nurse Practitioner

## 2019-09-30 ENCOUNTER — Other Ambulatory Visit: Payer: Self-pay

## 2019-09-30 ENCOUNTER — Ambulatory Visit (HOSPITAL_COMMUNITY)
Admission: RE | Admit: 2019-09-30 | Discharge: 2019-09-30 | Disposition: A | Discharge status: Home / Self Care | Payer: Managed Care, Other (non HMO) | Source: Ambulatory Visit | Attending: Physician Assistant | Admitting: Physician Assistant

## 2019-09-30 ENCOUNTER — Other Ambulatory Visit (HOSPITAL_COMMUNITY): Payer: Managed Care, Other (non HMO)

## 2019-09-30 DIAGNOSIS — Z01818 Encounter for other preprocedural examination: Secondary | ICD-10-CM | POA: Insufficient documentation

## 2019-09-30 DIAGNOSIS — Z20822 Contact with and (suspected) exposure to covid-19: Secondary | ICD-10-CM | POA: Diagnosis not present

## 2019-09-30 DIAGNOSIS — I10 Essential (primary) hypertension: Secondary | ICD-10-CM

## 2019-09-30 DIAGNOSIS — M1611 Unilateral primary osteoarthritis, right hip: Secondary | ICD-10-CM

## 2019-09-30 LAB — CBC WITH DIFFERENTIAL/PLATELET
Abs Immature Granulocytes: 0.03 10*3/uL (ref 0.00–0.07)
Basophils Absolute: 0 10*3/uL (ref 0.0–0.1)
Basophils Relative: 0 %
Eosinophils Absolute: 0.2 10*3/uL (ref 0.0–0.5)
Eosinophils Relative: 2 %
HCT: 45.6 % (ref 39.0–52.0)
Hemoglobin: 15.3 g/dL (ref 13.0–17.0)
Immature Granulocytes: 0 %
Lymphocytes Relative: 23 %
Lymphs Abs: 1.9 10*3/uL (ref 0.7–4.0)
MCH: 28.7 pg (ref 26.0–34.0)
MCHC: 33.6 g/dL (ref 30.0–36.0)
MCV: 85.6 fL (ref 80.0–100.0)
Monocytes Absolute: 0.6 10*3/uL (ref 0.1–1.0)
Monocytes Relative: 8 %
Neutro Abs: 5.4 10*3/uL (ref 1.7–7.7)
Neutrophils Relative %: 67 %
Platelets: 199 10*3/uL (ref 150–400)
RBC: 5.33 MIL/uL (ref 4.22–5.81)
RDW: 14 % (ref 11.5–15.5)
WBC: 8.1 10*3/uL (ref 4.0–10.5)
nRBC: 0 % (ref 0.0–0.2)

## 2019-09-30 LAB — URINALYSIS, ROUTINE W REFLEX MICROSCOPIC
Bilirubin Urine: NEGATIVE
Glucose, UA: NEGATIVE mg/dL
Hgb urine dipstick: NEGATIVE
Ketones, ur: NEGATIVE mg/dL
Leukocytes,Ua: NEGATIVE
Nitrite: NEGATIVE
Protein, ur: NEGATIVE mg/dL
Specific Gravity, Urine: 1.013 (ref 1.005–1.030)
pH: 5 (ref 5.0–8.0)

## 2019-09-30 LAB — SURGICAL PCR SCREEN
MRSA, PCR: NEGATIVE
Staphylococcus aureus: NEGATIVE

## 2019-09-30 LAB — PROTIME-INR
INR: 1.1 (ref 0.8–1.2)
Prothrombin Time: 14.1 seconds (ref 11.4–15.2)

## 2019-09-30 LAB — COMPREHENSIVE METABOLIC PANEL
ALT: 25 U/L (ref 0–44)
AST: 22 U/L (ref 15–41)
Albumin: 4.7 g/dL (ref 3.5–5.0)
Alkaline Phosphatase: 85 U/L (ref 38–126)
Anion gap: 9 (ref 5–15)
BUN: 13 mg/dL (ref 8–23)
CO2: 27 mmol/L (ref 22–32)
Calcium: 9.3 mg/dL (ref 8.9–10.3)
Chloride: 105 mmol/L (ref 98–111)
Creatinine, Ser: 1.05 mg/dL (ref 0.61–1.24)
GFR calc Af Amer: >60 mL/min (ref >60)
GFR calc non Af Amer: >60 mL/min (ref >60)
Glucose, Bld: 120 mg/dL — ABNORMAL HIGH (ref 70–99)
Potassium: 4.3 mmol/L (ref 3.5–5.1)
Sodium: 141 mmol/L (ref 135–145)
Total Bilirubin: 0.6 mg/dL (ref 0.3–1.2)
Total Protein: 8.1 g/dL (ref 6.5–8.1)

## 2019-09-30 LAB — HEMOGLOBIN A1C
Hgb A1c MFr Bld: 6.3 % — ABNORMAL HIGH (ref 4.8–5.6)
Mean Plasma Glucose: 134.11 mg/dL

## 2019-09-30 LAB — APTT: aPTT: 26 seconds (ref 24–36)

## 2019-09-30 LAB — SARS CORONAVIRUS 2 (TAT 6-24 HRS): SARS Coronavirus 2: NEGATIVE

## 2019-09-30 MED ORDER — AMLODIPINE BESYLATE 5 MG PO TABS: 10.0000 mg | ORAL_TABLET | Freq: Every morning | ORAL | 3 refills | Status: DC

## 2019-09-30 NOTE — Telephone Encounter (Signed)
Called pt and informed him about new orders below. Advised to monitor B/P qAM, then call on Monday (10/03/19) with readings. Medication list updated to reflect change in dosage. Declined refills at this time.  Wilkie Aye - if pt has not called, please call to f/u and obtain readings.

## 2019-09-30 NOTE — Progress Notes (Addendum)
COVID Vaccine Completed:yes Date COVID Vaccine completed:04/29/19 COVID vaccine manufacturer: Pfizer    *[employer]   PCP - Alysia Penna. NP Cardiologist - NO  Chest x-ray -  EKG -  Stress Test -  ECHO -  Cardiac Cath -   Sleep Study -  CPAP -   Fasting Blood Sugar -  Checks Blood Sugar _____ times a day  Blood Thinner Instructions: Aspirin Instructions: To continue taking it as per Dr. Roda Shutters. Last Dose:  Anesthesia review: Pt. BP elevated(142/115,148/108). Shanda Bumps PA. Informed. Alysia Penna NP was notified as well.  Patient denies shortness of breath, fever, cough and chest pain at PAT appointment   Patient verbalized understanding of instructions that were given to them at the PAT appointment. Patient was also instructed that they will need to review over the PAT instructions again at home before surgery.

## 2019-09-30 NOTE — Telephone Encounter (Signed)
Doree Fudge is calling and stated that patient is at his pre admitting appointment before surgery and his BP was 142/115. Caller is requesting last office visit notes be faxed to [phone], please advise. CB is [phone]

## 2019-09-30 NOTE — Telephone Encounter (Signed)
Please advise to increase amlodipine to 10mg  daily (can be take at once or 5mg  BID). He should monitor BP at home (once in AM), and call office with readings on Monday. Thank you

## 2019-09-30 NOTE — Telephone Encounter (Signed)
Called pt to f/u. Confirmed he did take Amlodipine 5mg  this morning and confirmed he does take this every day without missing a dose. Denies any dyspnea, chest pain, dizziness, headache or numbness to extremities. Advised I will forward this message to PCP for further review and orders. Last office note forwarded to preop as requested. Will also call [facility] Preop with new orders as well for continuity of care purposes.

## 2019-10-02 MED ORDER — BUPIVACAINE LIPOSOME 1.3 % IJ SUSP
20.0000 mL | Freq: Once | INTRAMUSCULAR | Status: DC
  Filled 2019-10-02: qty 20

## 2019-10-02 MED ORDER — TRANEXAMIC ACID 1000 MG/10ML IV SOLN
2000.0000 mg | INTRAVENOUS | Status: DC
  Filled 2019-10-02: qty 20

## 2019-10-03 ENCOUNTER — Ambulatory Visit (HOSPITAL_COMMUNITY): Payer: Managed Care, Other (non HMO) | Admitting: Anesthesiology

## 2019-10-03 ENCOUNTER — Other Ambulatory Visit: Payer: Self-pay

## 2019-10-03 ENCOUNTER — Encounter (HOSPITAL_COMMUNITY): Payer: Self-pay | Admitting: Orthopaedic Surgery

## 2019-10-03 ENCOUNTER — Ambulatory Visit (HOSPITAL_COMMUNITY): Payer: Managed Care, Other (non HMO)

## 2019-10-03 ENCOUNTER — Ambulatory Visit (HOSPITAL_COMMUNITY): Payer: Managed Care, Other (non HMO) | Admitting: Physician Assistant

## 2019-10-03 ENCOUNTER — Observation Stay (HOSPITAL_COMMUNITY): Payer: Managed Care, Other (non HMO)

## 2019-10-03 ENCOUNTER — Encounter (HOSPITAL_COMMUNITY)
Admission: RE | Disposition: A | Discharge status: Home / Self Care | Payer: Self-pay | Source: Home / Self Care | Attending: Orthopaedic Surgery

## 2019-10-03 ENCOUNTER — Encounter: Payer: Self-pay | Admitting: Nurse Practitioner

## 2019-10-03 ENCOUNTER — Observation Stay (HOSPITAL_COMMUNITY)
Admission: RE | Admit: 2019-10-03 | Discharge: 2019-10-04 | Disposition: A | Discharge status: Home / Self Care | Payer: Managed Care, Other (non HMO) | Attending: Orthopaedic Surgery | Admitting: Orthopaedic Surgery

## 2019-10-03 DIAGNOSIS — M1611 Unilateral primary osteoarthritis, right hip: Principal | ICD-10-CM | POA: Insufficient documentation

## 2019-10-03 DIAGNOSIS — Z7982 Long term (current) use of aspirin: Secondary | ICD-10-CM | POA: Diagnosis not present

## 2019-10-03 DIAGNOSIS — Z96641 Presence of right artificial hip joint: Secondary | ICD-10-CM | POA: Diagnosis not present

## 2019-10-03 DIAGNOSIS — Z419 Encounter for procedure for purposes other than remedying health state, unspecified: Secondary | ICD-10-CM

## 2019-10-03 DIAGNOSIS — Z87891 Personal history of nicotine dependence: Secondary | ICD-10-CM | POA: Insufficient documentation

## 2019-10-03 DIAGNOSIS — Z96649 Presence of unspecified artificial hip joint: Secondary | ICD-10-CM

## 2019-10-03 DIAGNOSIS — I10 Essential (primary) hypertension: Secondary | ICD-10-CM | POA: Insufficient documentation

## 2019-10-03 DIAGNOSIS — Z79899 Other long term (current) drug therapy: Secondary | ICD-10-CM | POA: Diagnosis not present

## 2019-10-03 DIAGNOSIS — Z471 Aftercare following joint replacement surgery: Secondary | ICD-10-CM | POA: Diagnosis not present

## 2019-10-03 DIAGNOSIS — M25551 Pain in right hip: Secondary | ICD-10-CM | POA: Diagnosis present

## 2019-10-03 HISTORY — PX: TOTAL HIP ARTHROPLASTY: SHX124

## 2019-10-03 LAB — TYPE AND SCREEN
ABO/RH(D): O POS
Antibody Screen: NEGATIVE

## 2019-10-03 LAB — PREALBUMIN: Prealbumin: 30.3 mg/dL (ref 18–38)

## 2019-10-03 SURGERY — ARTHROPLASTY, HIP, TOTAL, ANTERIOR APPROACH
Anesthesia: Monitor Anesthesia Care | Site: Hip | Laterality: Right

## 2019-10-03 MED ORDER — DEXAMETHASONE SODIUM PHOSPHATE 10 MG/ML IJ SOLN
10.0000 mg | Freq: Once | INTRAMUSCULAR | Status: AC
  Administered 2019-10-04: 10 mg via INTRAVENOUS
  Filled 2019-10-03: qty 1

## 2019-10-03 MED ORDER — ONDANSETRON HCL 4 MG PO TABS: 4.0000 mg | ORAL_TABLET | Freq: Three times a day (TID) | ORAL | 0 refills | Status: DC | PRN

## 2019-10-03 MED ORDER — POVIDONE-IODINE 10 % EX SWAB
2.0000 "application " | Freq: Once | CUTANEOUS | Status: AC
  Administered 2019-10-03: 2 via TOPICAL

## 2019-10-03 MED ORDER — PHENYLEPHRINE 40 MCG/ML (10ML) SYRINGE FOR IV PUSH (FOR BLOOD PRESSURE SUPPORT)
PREFILLED_SYRINGE | INTRAVENOUS | Status: DC | PRN
  Administered 2019-10-03: 120 ug via INTRAVENOUS

## 2019-10-03 MED ORDER — PHENYLEPHRINE 40 MCG/ML (10ML) SYRINGE FOR IV PUSH (FOR BLOOD PRESSURE SUPPORT)
PREFILLED_SYRINGE | INTRAVENOUS | Status: AC
  Filled 2019-10-03: qty 10

## 2019-10-03 MED ORDER — OXYCODONE HCL 5 MG PO TABS: 10.0000 mg | ORAL_TABLET | ORAL | Status: DC | PRN

## 2019-10-03 MED ORDER — VANCOMYCIN HCL 1 G IV SOLR
INTRAVENOUS | Status: DC | PRN
  Administered 2019-10-03: 1000 mg

## 2019-10-03 MED ORDER — FENTANYL CITRATE (PF) 100 MCG/2ML IJ SOLN
INTRAMUSCULAR | Status: AC
  Filled 2019-10-03: qty 2

## 2019-10-03 MED ORDER — CHLORHEXIDINE GLUCONATE 0.12 % MT SOLN
15.0000 mL | Freq: Once | OROMUCOSAL | Status: AC
  Administered 2019-10-03: 15 mL via OROMUCOSAL

## 2019-10-03 MED ORDER — OXYCODONE HCL 5 MG PO TABS
5.0000 mg | ORAL_TABLET | ORAL | Status: DC | PRN
  Administered 2019-10-03 – 2019-10-04 (×4): 5 mg via ORAL
  Filled 2019-10-03 (×4): qty 1

## 2019-10-03 MED ORDER — DIPHENHYDRAMINE HCL 12.5 MG/5ML PO ELIX: 25.0000 mg | ORAL_SOLUTION | ORAL | Status: DC | PRN

## 2019-10-03 MED ORDER — POLYETHYLENE GLYCOL 3350 17 G PO PACK: 17.0000 g | PACK | Freq: Every day | ORAL | Status: DC | PRN

## 2019-10-03 MED ORDER — PANTOPRAZOLE SODIUM 20 MG PO TBEC
20.0000 mg | DELAYED_RELEASE_TABLET | Freq: Every day | ORAL | Status: DC
  Administered 2019-10-04: 20 mg via ORAL
  Filled 2019-10-03: qty 1

## 2019-10-03 MED ORDER — GABAPENTIN 300 MG PO CAPS
300.0000 mg | ORAL_CAPSULE | Freq: Three times a day (TID) | ORAL | Status: DC
  Administered 2019-10-03 – 2019-10-04 (×3): 300 mg via ORAL
  Filled 2019-10-03 (×3): qty 1

## 2019-10-03 MED ORDER — LACTATED RINGERS IV SOLN: INTRAVENOUS | Status: DC

## 2019-10-03 MED ORDER — KETOROLAC TROMETHAMINE 15 MG/ML IJ SOLN
15.0000 mg | Freq: Four times a day (QID) | INTRAMUSCULAR | Status: AC
  Administered 2019-10-03 – 2019-10-04 (×4): 15 mg via INTRAVENOUS
  Filled 2019-10-03 (×4): qty 1

## 2019-10-03 MED ORDER — FENTANYL CITRATE (PF) 100 MCG/2ML IJ SOLN
INTRAMUSCULAR | Status: DC | PRN
  Administered 2019-10-03 (×2): 50 ug via INTRAVENOUS
  Administered 2019-10-03: 25 ug via INTRAVENOUS
  Administered 2019-10-03 (×2): 50 ug via INTRAVENOUS
  Administered 2019-10-03: 25 ug via INTRAVENOUS
  Administered 2019-10-03: 50 ug via INTRAVENOUS

## 2019-10-03 MED ORDER — PROPOFOL 10 MG/ML IV BOLUS
INTRAVENOUS | Status: AC
  Filled 2019-10-03: qty 20

## 2019-10-03 MED ORDER — MIDAZOLAM HCL 2 MG/2ML IJ SOLN
INTRAMUSCULAR | Status: DC | PRN
  Administered 2019-10-03: 2 mg via INTRAVENOUS

## 2019-10-03 MED ORDER — ALUM & MAG HYDROXIDE-SIMETH 200-200-20 MG/5ML PO SUSP: 30.0000 mL | ORAL | Status: DC | PRN

## 2019-10-03 MED ORDER — OXYCODONE HCL ER 10 MG PO T12A
10.0000 mg | EXTENDED_RELEASE_TABLET | Freq: Two times a day (BID) | ORAL | Status: DC
  Administered 2019-10-03 – 2019-10-04 (×2): 10 mg via ORAL
  Filled 2019-10-03 (×2): qty 1

## 2019-10-03 MED ORDER — DEXAMETHASONE SODIUM PHOSPHATE 10 MG/ML IJ SOLN
INTRAMUSCULAR | Status: DC | PRN
  Administered 2019-10-03: 8 mg via INTRAVENOUS

## 2019-10-03 MED ORDER — TRANEXAMIC ACID-NACL 1000-0.7 MG/100ML-% IV SOLN
1000.0000 mg | INTRAVENOUS | Status: AC
  Administered 2019-10-03: 1000 mg via INTRAVENOUS
  Filled 2019-10-03: qty 100

## 2019-10-03 MED ORDER — FENTANYL CITRATE (PF) 100 MCG/2ML IJ SOLN: 25.0000 ug | INTRAMUSCULAR | Status: DC | PRN

## 2019-10-03 MED ORDER — SODIUM CHLORIDE (PF) 0.9 % IJ SOLN
INTRAMUSCULAR | Status: DC | PRN
  Administered 2019-10-03: 20 mL

## 2019-10-03 MED ORDER — METOCLOPRAMIDE HCL 5 MG/ML IJ SOLN: 5.0000 mg | Freq: Three times a day (TID) | INTRAMUSCULAR | Status: DC | PRN

## 2019-10-03 MED ORDER — ASPIRIN 81 MG PO CHEW
81.0000 mg | CHEWABLE_TABLET | Freq: Two times a day (BID) | ORAL | Status: DC
  Administered 2019-10-03 – 2019-10-04 (×2): 81 mg via ORAL
  Filled 2019-10-03 (×2): qty 1

## 2019-10-03 MED ORDER — CEFAZOLIN SODIUM-DEXTROSE 2-4 GM/100ML-% IV SOLN
2.0000 g | Freq: Four times a day (QID) | INTRAVENOUS | Status: AC
  Administered 2019-10-03 – 2019-10-04 (×3): 2 g via INTRAVENOUS
  Filled 2019-10-03 (×3): qty 100

## 2019-10-03 MED ORDER — METOCLOPRAMIDE HCL 5 MG PO TABS: 5.0000 mg | ORAL_TABLET | Freq: Three times a day (TID) | ORAL | Status: DC | PRN

## 2019-10-03 MED ORDER — ORAL CARE MOUTH RINSE: 15.0000 mL | Freq: Once | OROMUCOSAL | Status: AC

## 2019-10-03 MED ORDER — SODIUM CHLORIDE 0.9 % IV SOLN: INTRAVENOUS | Status: DC

## 2019-10-03 MED ORDER — MAGNESIUM CITRATE PO SOLN: 1.0000 | Freq: Once | ORAL | Status: DC | PRN

## 2019-10-03 MED ORDER — BUPIVACAINE HCL (PF) 0.25 % IJ SOLN
INTRAMUSCULAR | Status: DC | PRN
  Administered 2019-10-03: 20 mL

## 2019-10-03 MED ORDER — OXYCODONE HCL ER 10 MG PO T12A: 10.0000 mg | EXTENDED_RELEASE_TABLET | Freq: Two times a day (BID) | ORAL | 0 refills | Status: DC

## 2019-10-03 MED ORDER — OXYCODONE-ACETAMINOPHEN 5-325 MG PO TABS: 1.0000 | ORAL_TABLET | Freq: Four times a day (QID) | ORAL | 0 refills | Status: DC | PRN

## 2019-10-03 MED ORDER — HYDROMORPHONE HCL 1 MG/ML IJ SOLN
0.5000 mg | INTRAMUSCULAR | Status: DC | PRN
  Administered 2019-10-03: 1 mg via INTRAVENOUS
  Filled 2019-10-03: qty 1

## 2019-10-03 MED ORDER — PROMETHAZINE HCL 25 MG/ML IJ SOLN: 6.2500 mg | INTRAMUSCULAR | Status: DC | PRN

## 2019-10-03 MED ORDER — SORBITOL 70 % SOLN
30.0000 mL | Freq: Every day | Status: DC | PRN
  Filled 2019-10-03: qty 30

## 2019-10-03 MED ORDER — ACETAMINOPHEN 325 MG PO TABS: 325.0000 mg | ORAL_TABLET | Freq: Four times a day (QID) | ORAL | Status: DC | PRN

## 2019-10-03 MED ORDER — AMLODIPINE BESYLATE 10 MG PO TABS
10.0000 mg | ORAL_TABLET | Freq: Every morning | ORAL | Status: DC
  Administered 2019-10-04: 10 mg via ORAL
  Filled 2019-10-03: qty 1

## 2019-10-03 MED ORDER — PROPOFOL 10 MG/ML IV BOLUS
INTRAVENOUS | Status: DC | PRN
  Administered 2019-10-03: 100 mg via INTRAVENOUS
  Administered 2019-10-03 (×5): 10 mg via INTRAVENOUS
  Administered 2019-10-03: 150 mg via INTRAVENOUS

## 2019-10-03 MED ORDER — SIMVASTATIN 20 MG PO TABS
20.0000 mg | ORAL_TABLET | ORAL | Status: DC
  Filled 2019-10-03: qty 1

## 2019-10-03 MED ORDER — MIDAZOLAM HCL 2 MG/2ML IJ SOLN
INTRAMUSCULAR | Status: AC
  Filled 2019-10-03: qty 2

## 2019-10-03 MED ORDER — BUPIVACAINE HCL (PF) 0.25 % IJ SOLN
INTRAMUSCULAR | Status: AC
  Filled 2019-10-03: qty 30

## 2019-10-03 MED ORDER — PHENOL 1.4 % MT LIQD: 1.0000 | OROMUCOSAL | Status: DC | PRN

## 2019-10-03 MED ORDER — CEFAZOLIN SODIUM-DEXTROSE 2-4 GM/100ML-% IV SOLN
2.0000 g | INTRAVENOUS | Status: AC
  Administered 2019-10-03: 2 g via INTRAVENOUS
  Filled 2019-10-03: qty 100

## 2019-10-03 MED ORDER — ASPIRIN EC 81 MG PO TBEC: 81.0000 mg | DELAYED_RELEASE_TABLET | Freq: Two times a day (BID) | ORAL | 11 refills | Status: DC

## 2019-10-03 MED ORDER — 0.9 % SODIUM CHLORIDE (POUR BTL) OPTIME
TOPICAL | Status: DC | PRN
  Administered 2019-10-03: 1000 mL

## 2019-10-03 MED ORDER — ONDANSETRON HCL 4 MG/2ML IJ SOLN
INTRAMUSCULAR | Status: AC
  Filled 2019-10-03: qty 2

## 2019-10-03 MED ORDER — HYDROMORPHONE HCL 2 MG/ML IJ SOLN
INTRAMUSCULAR | Status: AC
  Filled 2019-10-03: qty 1

## 2019-10-03 MED ORDER — TRANEXAMIC ACID 1000 MG/10ML IV SOLN
INTRAVENOUS | Status: DC | PRN
  Administered 2019-10-03: 2000 mg via TOPICAL

## 2019-10-03 MED ORDER — BUPIVACAINE LIPOSOME 1.3 % IJ SUSP
INTRAMUSCULAR | Status: DC | PRN
  Administered 2019-10-03: 20 mL

## 2019-10-03 MED ORDER — SODIUM CHLORIDE 0.9 % IR SOLN
Status: DC | PRN
  Administered 2019-10-03: 1000 mL

## 2019-10-03 MED ORDER — ISOPROPYL ALCOHOL 70 % SOLN
Status: DC | PRN
  Administered 2019-10-03: 1 via TOPICAL

## 2019-10-03 MED ORDER — DOCUSATE SODIUM 100 MG PO CAPS
100.0000 mg | ORAL_CAPSULE | Freq: Two times a day (BID) | ORAL | Status: DC
  Administered 2019-10-03 – 2019-10-04 (×2): 100 mg via ORAL
  Filled 2019-10-03 (×2): qty 1

## 2019-10-03 MED ORDER — STERILE WATER FOR IRRIGATION IR SOLN
Status: DC | PRN
  Administered 2019-10-03: 2000 mL

## 2019-10-03 MED ORDER — HYDROMORPHONE HCL 1 MG/ML IJ SOLN
INTRAMUSCULAR | Status: DC | PRN
  Administered 2019-10-03: .6 mg via INTRAVENOUS

## 2019-10-03 MED ORDER — MENTHOL 3 MG MT LOZG: 1.0000 | LOZENGE | OROMUCOSAL | Status: DC | PRN

## 2019-10-03 MED ORDER — METHOCARBAMOL 500 MG PO TABS: 500.0000 mg | ORAL_TABLET | Freq: Three times a day (TID) | ORAL | 0 refills | Status: DC | PRN

## 2019-10-03 MED ORDER — DEXAMETHASONE SODIUM PHOSPHATE 10 MG/ML IJ SOLN
INTRAMUSCULAR | Status: AC
  Filled 2019-10-03: qty 1

## 2019-10-03 MED ORDER — ALLOPURINOL 100 MG PO TABS
100.0000 mg | ORAL_TABLET | Freq: Two times a day (BID) | ORAL | Status: DC
  Administered 2019-10-03 – 2019-10-04 (×2): 100 mg via ORAL
  Filled 2019-10-03 (×2): qty 1

## 2019-10-03 MED ORDER — ONDANSETRON HCL 4 MG/2ML IJ SOLN: 4.0000 mg | Freq: Four times a day (QID) | INTRAMUSCULAR | Status: DC | PRN

## 2019-10-03 MED ORDER — VANCOMYCIN HCL 1000 MG IV SOLR
INTRAVENOUS | Status: AC
  Filled 2019-10-03: qty 1000

## 2019-10-03 MED ORDER — ONDANSETRON HCL 4 MG/2ML IJ SOLN
INTRAMUSCULAR | Status: DC | PRN
  Administered 2019-10-03: 4 mg via INTRAVENOUS

## 2019-10-03 MED ORDER — HYDROMORPHONE HCL 1 MG/ML IJ SOLN
0.2500 mg | INTRAMUSCULAR | Status: DC | PRN
  Administered 2019-10-03: 0.5 mg via INTRAVENOUS

## 2019-10-03 MED ORDER — ONDANSETRON HCL 4 MG PO TABS: 4.0000 mg | ORAL_TABLET | Freq: Four times a day (QID) | ORAL | Status: DC | PRN

## 2019-10-03 MED ORDER — ACETAMINOPHEN 500 MG PO TABS
1000.0000 mg | ORAL_TABLET | Freq: Four times a day (QID) | ORAL | Status: AC
  Administered 2019-10-03 – 2019-10-04 (×4): 1000 mg via ORAL
  Filled 2019-10-03 (×4): qty 2

## 2019-10-03 MED ORDER — HYDROMORPHONE HCL 1 MG/ML IJ SOLN
INTRAMUSCULAR | Status: AC
  Administered 2019-10-03: 0.5 mg via INTRAVENOUS
  Filled 2019-10-03: qty 1

## 2019-10-03 MED ORDER — SODIUM CHLORIDE (PF) 0.9 % IJ SOLN
INTRAMUSCULAR | Status: AC
  Filled 2019-10-03: qty 50

## 2019-10-03 SURGICAL SUPPLY — 66 items
ADH SKN CLS APL DERMABOND .7 (GAUZE/BANDAGES/DRESSINGS) ×1
BAG DECANTER FOR FLEXI CONT (MISCELLANEOUS) ×2 IMPLANT
BAG SPEC THK2 15X12 ZIP CLS (MISCELLANEOUS) ×1
BAG ZIPLOCK 12X15 (MISCELLANEOUS) ×2 IMPLANT
CELLS DAT CNTRL 66122 CELL SVR (MISCELLANEOUS) IMPLANT
COVER PERINEAL POST (MISCELLANEOUS) ×2 IMPLANT
COVER SURGICAL LIGHT HANDLE (MISCELLANEOUS) ×2 IMPLANT
COVER WAND RF STERILE (DRAPES) IMPLANT
CUP ACET PNNCL SECTR W/GRIP 56 (Hips) IMPLANT
DECANTER SPIKE VIAL GLASS SM (MISCELLANEOUS) ×1 IMPLANT
DERMABOND ADVANCED (GAUZE/BANDAGES/DRESSINGS) ×1
DERMABOND ADVANCED .7 DNX12 (GAUZE/BANDAGES/DRESSINGS) IMPLANT
DRAPE IMP U-DRAPE 54X76 (DRAPES) ×4 IMPLANT
DRAPE POUCH INSTRU U-SHP 10X18 (DRAPES) ×2 IMPLANT
DRAPE STERI IOBAN 125X83 (DRAPES) ×2 IMPLANT
DRAPE U-SHAPE 47X51 STRL (DRAPES) ×4 IMPLANT
DRESSING AQUACEL AG SP 3.5X10 (GAUZE/BANDAGES/DRESSINGS) IMPLANT
DRSG AQUACEL AG SP 3.5X10 (GAUZE/BANDAGES/DRESSINGS) ×2
DRSG MEPILEX BORDER 4X8 (GAUZE/BANDAGES/DRESSINGS) ×1 IMPLANT
DURAPREP 26ML APPLICATOR (WOUND CARE) ×3 IMPLANT
ELECT BLADE TIP CTD 4 INCH (ELECTRODE) ×3 IMPLANT
ELECT REM PT RETURN 15FT ADLT (MISCELLANEOUS) ×2 IMPLANT
FEM STEM 12/14 TAPER SZ 4 HIP (Orthopedic Implant) ×2 IMPLANT
FEMORAL STEM 12/14 TPR SZ4 HIP (Orthopedic Implant) IMPLANT
GLOVE BIOGEL PI IND STRL 7.0 (GLOVE) ×1 IMPLANT
GLOVE BIOGEL PI INDICATOR 7.0 (GLOVE) ×1
GLOVE ECLIPSE 7.0 STRL STRAW (GLOVE) ×2 IMPLANT
GLOVE SKINSENSE NS SZ7.5 (GLOVE) ×1
GLOVE SKINSENSE STRL SZ7.5 (GLOVE) ×1 IMPLANT
GLOVE SURG SYN 7.5  E (GLOVE) ×8
GLOVE SURG SYN 7.5 E (GLOVE) ×4 IMPLANT
GLOVE SURG SYN 7.5 PF PI (GLOVE) ×4 IMPLANT
GOWN STRL REIN XL XLG (GOWN DISPOSABLE) ×4 IMPLANT
GOWN STRL REUS W/TWL LRG LVL3 (GOWN DISPOSABLE) ×2 IMPLANT
HANDPIECE INTERPULSE COAX TIP (DISPOSABLE) ×2
HEAD CERAMIC 36 PLUS5 (Hips) ×1 IMPLANT
HOOD PEEL AWAY FLYTE STAYCOOL (MISCELLANEOUS) ×6 IMPLANT
JET LAVAGE IRRISEPT WOUND (IRRIGATION / IRRIGATOR) ×2
KIT BASIN OR (CUSTOM PROCEDURE TRAY) ×2 IMPLANT
KIT TURNOVER KIT A (KITS) IMPLANT
LAVAGE JET IRRISEPT WOUND (IRRIGATION / IRRIGATOR) IMPLANT
MARKER SKIN DUAL TIP RULER LAB (MISCELLANEOUS) ×2 IMPLANT
NDL SPNL 18GX3.5 QUINCKE PK (NEEDLE) ×1 IMPLANT
NEEDLE SPNL 18GX3.5 QUINCKE PK (NEEDLE) ×2 IMPLANT
PACK ANTERIOR HIP CUSTOM (KITS) ×2 IMPLANT
PACK UNIVERSAL I (CUSTOM PROCEDURE TRAY) ×3 IMPLANT
PENCIL SMOKE EVACUATOR (MISCELLANEOUS) IMPLANT
PINN SECTOR W/GRIP ACE CUP 56 (Hips) ×2 IMPLANT
PINNACLE ALTRX PLUS 4 N 36X56 (Hips) ×1 IMPLANT
RETRACTOR WND ALEXIS 18 MED (MISCELLANEOUS) ×1 IMPLANT
RTRCTR WOUND ALEXIS 18CM MED (MISCELLANEOUS)
SAW OSC TIP CART 19.5X105X1.3 (SAW) ×2 IMPLANT
SCREW 6.5MMX25MM (Screw) ×1 IMPLANT
SET HNDPC FAN SPRY TIP SCT (DISPOSABLE) ×1 IMPLANT
STAPLER VISISTAT 35W (STAPLE) ×1 IMPLANT
SUT ETHIBOND 2 V 37 (SUTURE) ×2 IMPLANT
SUT MNCRL AB 3-0 PS2 18 (SUTURE) ×2 IMPLANT
SUT VIC AB 0 CT1 36 (SUTURE) ×2 IMPLANT
SUT VIC AB 1 CTX 36 (SUTURE) ×2
SUT VIC AB 1 CTX36XBRD ANBCTR (SUTURE) ×1 IMPLANT
SUT VIC AB 2-0 CT1 27 (SUTURE) ×2
SUT VIC AB 2-0 CT1 TAPERPNT 27 (SUTURE) ×1 IMPLANT
TOWEL OR 17X26 10 PK STRL BLUE (TOWEL DISPOSABLE) ×2 IMPLANT
TOWEL OR NON WOVEN STRL DISP B (DISPOSABLE) ×2 IMPLANT
TRAY FOLEY MTR SLVR 16FR STAT (SET/KITS/TRAYS/PACK) IMPLANT
YANKAUER SUCT BULB TIP NO VENT (SUCTIONS) ×1 IMPLANT

## 2019-10-03 NOTE — Anesthesia Postprocedure Evaluation (Signed)
Anesthesia Post Note  Patient: Marvin [name]  Procedure(s) Performed: RIGHT TOTAL HIP ARTHROPLASTY ANTERIOR APPROACH (Right Hip)     Patient location during evaluation: PACU Anesthesia Type: General Level of consciousness: awake and alert Pain management: pain level controlled Vital Signs Assessment: post-procedure vital signs reviewed and stable Respiratory status: spontaneous breathing, nonlabored ventilation and respiratory function stable Cardiovascular status: blood pressure returned to baseline and stable Postop Assessment: no apparent nausea or vomiting Anesthetic complications: no   No complications documented.  Last Vitals:  Vitals:   10/03/19 1342 10/03/19 1345  BP:  (!) 153/86  Pulse: 75 70  Resp: 19 20  Temp:  36.8 C  SpO2: 97% 100%    Last Pain:  Vitals:   10/03/19 1342  TempSrc:   PainSc: 5                  Candra R Carey Johndrow

## 2019-10-03 NOTE — Anesthesia Preprocedure Evaluation (Addendum)
Anesthesia Evaluation  Patient identified by MRN, date of birth, ID band  Reviewed: Allergy & Precautions, NPO status , Patient's Chart, lab work & pertinent test results  History of Anesthesia Complications Negative for: history of anesthetic complications  Airway Mallampati: I  TM Distance: >3 FB Neck ROM: Full    Dental  (+) Partial Lower   Pulmonary former smoker,    Pulmonary exam normal breath sounds clear to auscultation       Cardiovascular hypertension, Normal cardiovascular exam Rhythm:Regular Rate:Normal     Neuro/Psych PSYCHIATRIC DISORDERS    GI/Hepatic GERD  Controlled,(+)     substance abuse (h/o drug/alcohol abuse)  ,   Endo/Other  negative endocrine ROS  Renal/GU negative Renal ROS     Musculoskeletal  (+) Arthritis ,   Abdominal Normal abdominal exam  (+)   Peds  Hematology negative hematology ROS (+)   Anesthesia Other Findings   Reproductive/Obstetrics                            Anesthesia Physical Anesthesia Plan  ASA: III  Anesthesia Plan: MAC and Spinal   Post-op Pain Management:  Regional for Post-op pain   Induction: Intravenous  PONV Risk Score and Plan: Ondansetron, Dexamethasone and Propofol infusion  Airway Management Planned: Natural Airway and Simple Face Mask  Additional Equipment:   Intra-op Plan:   Post-operative Plan:   Informed Consent: I have reviewed the patients History and Physical, chart, labs and discussed the procedure including the risks, benefits and alternatives for the proposed anesthesia with the patient or authorized representative who has indicated his/her understanding and acceptance.     Dental advisory given  Plan Discussed with:   Anesthesia Plan Comments:        Anesthesia Quick Evaluation

## 2019-10-03 NOTE — Evaluation (Signed)
Physical Therapy Evaluation Patient Details Name: Marvin [name] MRN: [id number] DOB: 12/21/1950 Today's Date: 10/03/2019   History of Present Illness  s/p R DA THA. 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  Pt doing very well today, amb ~ 140' with RW and min/guard assist. Cautioned pt regarding over doing it today/in early phases of healing, he reports he knows he will be sore but is motivated to mobilize. Anticipate excellent progress in acute setting.   Pt will benefit from skilled PT to increase their independence and safety with mobility to allow discharge to the venue listed below.      Follow Up Recommendations Follow surgeon's recommendation for DC plan and follow-up therapies    Equipment Recommendations       Recommendations for Other Services       Precautions / Restrictions Precautions Precautions: None Restrictions Weight Bearing Restrictions: No Other Position/Activity Restrictions: WBAT      Mobility  Bed Mobility Overal bed mobility: Needs Assistance Bed Mobility: Supine to Sit     Supine to sit: HOB elevated;Supervision     General bed mobility comments: for safety  Transfers Overall transfer level: Needs assistance Equipment used: Rolling walker (2 wheeled) Transfers: Sit to/from Stand Sit to Stand: Min guard         General transfer comment: cues for hand placement  Ambulation/Gait Ambulation/Gait assistance: Min guard Gait Distance (Feet): 140 Feet Assistive device: Rolling walker (2 wheeled) Gait Pattern/deviations: Step-to pattern;Step-through pattern;Decreased stance time - right     General Gait Details: cues for sequence and RW position  Stairs            Wheelchair Mobility    Modified Rankin (Stroke Patients Only)       Balance                                             Pertinent Vitals/Pain Pain  Assessment: 0-10 Pain Score: 3  Pain Location: right hip Pain Descriptors / Indicators: Sore Pain Intervention(s): Limited activity within patient's tolerance;Monitored during session;Premedicated before session;Repositioned;Ice applied    Home Living Family/patient expects to be discharged to:: Private residence Living Arrangements: Spouse/significant other Available Help at Discharge: Family Type of Home: House                Prior Function Level of Independence: Independent               Hand Dominance        Extremity/Trunk Assessment   Upper Extremity Assessment Upper Extremity Assessment: Overall WFL for tasks assessed    Lower Extremity Assessment Lower Extremity Assessment: RLE deficits/detail RLE Deficits / Details: ankle WFL, knee and hip grossly 3/5       Communication   Communication: No difficulties  Cognition Arousal/Alertness: Awake/alert Behavior During Therapy: WFL for tasks assessed/performed Overall Cognitive Status: Within Functional Limits for tasks assessed                                        General Comments      Exercises Total Joint Exercises Ankle Circles/Pumps: AROM;Both;5 reps   Assessment/Plan    PT Assessment Patient needs continued PT services  PT  Problem List Decreased strength;Decreased mobility;Pain;Decreased knowledge of use of DME;Decreased range of motion       PT Treatment Interventions DME instruction;Therapeutic exercise;Gait training;Functional mobility training;Therapeutic activities;Patient/family education;Stair training    PT Goals (Current goals can be found in the Care Plan section)  Acute Rehab PT Goals Patient Stated Goal: home PT Goal Formulation: With patient Time For Goal Achievement: 10/10/19 Potential to Achieve Goals: Good    Frequency 7X/week   Barriers to discharge        Co-evaluation               AM-PAC PT "6 Clicks" Mobility  Outcome Measure Help  needed turning from your back to your side while in a flat bed without using bedrails?: None Help needed moving from lying on your back to sitting on the side of a flat bed without using bedrails?: None Help needed moving to and from a bed to a chair (including a wheelchair)?: A Little Help needed standing up from a chair using your arms (e.g., wheelchair or bedside chair)?: A Little Help needed to walk in hospital room?: A Little Help needed climbing 3-5 steps with a railing? : A Little 6 Click Score: 20    End of Session Equipment Utilized During Treatment: Gait belt Activity Tolerance: Patient tolerated treatment well Patient left: with call bell/phone within reach;with chair alarm set;in chair   PT Visit Diagnosis: Difficulty in walking, not elsewhere classified (R26.2)    Time: 4503-8882 PT Time Calculation (min) (ACUTE ONLY): 19 min   Charges:   PT Evaluation $PT Eval Low Complexity: 1 Low          Taria Castrillo, PT  Acute Rehab Dept ([facility]) [phone] Pager [phone]  10/03/2019   [facility] 10/03/2019, 5:03 PM

## 2019-10-03 NOTE — H&P (Signed)
PREOPERATIVE H&P  Chief Complaint: right hip degenerative joint disease  HPI: Marvin [name] is a 69 y.o. male who presents for surgical treatment of right hip degenerative joint disease.  He denies any changes in medical history.  Past Medical History:  Diagnosis Date  . Alcohol abuse   . Allergy   . Arthritis   . Drug abuse (HCC)   . Gout   . Hernia   . Hypercholesteremia   . Hypertension   . Wears dentures    bottom front   Past Surgical History:  Procedure Laterality Date  . HERNIA REPAIR     twice  . REPLACEMENT TOTAL KNEE     partial  . SHOULDER SURGERY     right   Social History   Socioeconomic History  . Marital status: Married    Spouse name: Not on file  . Number of children: Not on file  . Years of education: Not on file  . Highest education level: Not on file  Occupational History  . Not on file  Tobacco Use  . Smoking status: Former Smoker    Types: Cigarettes    Quit date: 03/20/1998    Years since quitting: 21.5  . Smokeless tobacco: Former Neurosurgeon    Types: Engineer, drilling  . Vaping Use: Never used  Substance and Sexual Activity  . Alcohol use: No  . Drug use: No  . Sexual activity: Yes    Birth control/protection: Condom  Other Topics Concern  . Not on file  Social History Narrative  . Not on file   Social Determinants of Health   Financial Resource Strain:   . Difficulty of Paying Living Expenses:   Food Insecurity:   . Worried About Programme researcher, broadcasting/film/video in the Last Year:   . Barista in the Last Year:   Transportation Needs:   . Freight forwarder (Medical):   Marland Kitchen Lack of Transportation (Non-Medical):   Physical Activity:   . Days of Exercise per Week:   . Minutes of Exercise per Session:   Stress:   . Feeling of Stress :   Social Connections:   . Frequency of Communication with Friends and Family:   . Frequency of Social Gatherings with Friends and Family:   . Attends Religious Services:   . Active Member of  Clubs or Organizations:   . Attends Banker Meetings:   Marland Kitchen Marital Status:    Family History  Problem Relation Age of Onset  . Healthy Mother   . Healthy Father   . Cancer Brother        bone cancer  . Cancer Maternal Aunt        breast cancer  . Cancer Maternal Uncle        lukemia  . Cancer Cousin        unknown  . Cancer Maternal Uncle        lukemia  . Cancer Paternal Uncle        prostate  . Hearing loss Maternal Grandfather 70   Allergies  Allergen Reactions  . Buprenorphine Hcl Itching  . Morphine And Related Itching   Prior to Admission medications   Medication Sig Start Date End Date Taking? Authorizing Provider  allopurinol (ZYLOPRIM) 100 MG tablet Take 1 tablet (100 mg total) by mouth 2 (two) times daily. Needs to schedule f/up appt 08/08/19  Yes Nche, Bonna Gains, NP  amLODipine (NORVASC) 5 MG tablet Take 2  tablets (10 mg total) by mouth every morning. May take 5mg  BID or 10mg  qd 09/30/19  Yes Nche, , NP  cyclobenzaprine (FLEXERIL) 10 MG tablet Take 0.5 tablets (5 mg total) by mouth at bedtime as needed for muscle spasms. 02/25/19  Yes Nche, Bonna Gains, NP  fluticasone (FLONASE) 50 MCG/ACT nasal spray SPRAY TWO SPRAYS IN EACH NOSTRIL ONCE DAILY AS DIRECTED Patient taking differently: Place 2 sprays into both nostrils daily.  08/10/19  Yes Nche, Bonna Gains, NP  meloxicam (MOBIC) 15 MG tablet Take 1 tablet (15 mg total) by mouth daily. 02/21/19  Yes Nche, Bonna Gains, NP  pantoprazole (PROTONIX) 20 MG tablet Take 20 mg by mouth daily.  02/21/19  Yes Nche, Bonna Gains, NP  aspirin EC 81 MG tablet Take 1 tablet (81 mg total) by mouth 2 (two) times daily. 10/03/19   Bonna Gains, PA-C  methocarbamol (ROBAXIN) 500 MG tablet Take 1 tablet (500 mg total) by mouth 3 (three) times daily as needed for muscle spasms. 10/03/19   Cristie Hem, PA-C  ondansetron (ZOFRAN) 4 MG tablet Take 1 tablet (4 mg total) by mouth every 8 (eight) hours as  needed for nausea or vomiting. 10/03/19   Cristie Hem, PA-C  oxyCODONE (OXYCONTIN) 10 mg 12 hr tablet Take 1 tablet (10 mg total) by mouth every 12 (twelve) hours. 10/03/19   Cristie Hem, PA-C  oxyCODONE-acetaminophen (PERCOCET) 5-325 MG tablet Take 1-2 tablets by mouth every 6 (six) hours as needed for severe pain. 10/03/19   Cristie Hem, PA-C  simvastatin (ZOCOR) 20 MG tablet Take 1 tablet (20 mg total) by mouth 3 (three) times a week. Take on Monday, Wednesday, and Friday Patient taking differently: Take 20 mg by mouth 2 (two) times a week.  06/23/18   Nche, Saturday, NP  Spacer/Aero-Holding Chambers (AEROCHAMBER PLUS WITH MASK) inhaler Use as instructed 04/03/15   Bonna Gains, MD  traMADol (ULTRAM) 50 MG tablet Take 1-2 tablets (50-100 mg total) by mouth daily as needed. Patient not taking: Reported on 09/20/2019 08/30/19   11/20/2019, MD     Positive ROS: All other systems have been reviewed and were otherwise negative with the exception of those mentioned in the HPI and as above.  Physical Exam: General: Alert, no acute distress Cardiovascular: No pedal edema Respiratory: No cyanosis, no use of accessory musculature GI: abdomen soft Skin: No lesions in the area of chief complaint Neurologic: Sensation intact distally Psychiatric: Patient is competent for consent with normal mood and affect Lymphatic: no lymphedema  MUSCULOSKELETAL: exam stable  Assessment: right hip degenerative joint disease  Plan: Plan for Procedure(s): RIGHT TOTAL HIP ARTHROPLASTY ANTERIOR APPROACH  The risks benefits and alternatives were discussed with the patient including but not limited to the risks of nonoperative treatment, versus surgical intervention including infection, bleeding, nerve injury,  blood clots, cardiopulmonary complications, morbidity, mortality, among others, and they were willing to proceed.   Preoperative templating of the joint replacement has been completed,  documented, and submitted to the Operating Room personnel in order to optimize intra-operative equipment management.   09/01/19, MD 10/03/2019 12:12 PM

## 2019-10-03 NOTE — Transfer of Care (Signed)
Immediate Anesthesia Transfer of Care Note  Patient: Marvin [name]  Procedure(s) Performed: RIGHT TOTAL HIP ARTHROPLASTY ANTERIOR APPROACH (Right Hip)  Patient Location: PACU  Anesthesia Type:General  Level of Consciousness: awake, alert  and oriented  Airway & Oxygen Therapy: Patient Spontanous Breathing and Patient connected to face mask oxygen  Post-op Assessment: Report given to RN, Post -op Vital signs reviewed and stable and Patient moving all extremities X 4  Post vital signs: Reviewed and stable  Last Vitals:  Vitals Value Taken Time  BP 141/90 10/03/19 1245  Temp    Pulse 71 10/03/19 1246  Resp 12 10/03/19 1247  SpO2 95 % 10/03/19 1246  Vitals shown include unvalidated device data.  Last Pain:  Vitals:   10/03/19 0801  TempSrc:   PainSc: 7       Patients Stated Pain Goal: 4 (10/03/19 0801)  Complications: No complications documented.

## 2019-10-03 NOTE — Anesthesia Procedure Notes (Signed)
Procedure Name: LMA Insertion Date/Time: 10/03/2019 10:43 AM Performed by: Nelle Don, CRNA Pre-anesthesia Checklist: Patient identified, Emergency Drugs available, Suction available and Patient being monitored Patient Re-evaluated:Patient Re-evaluated prior to induction Oxygen Delivery Method: Circle system utilized Preoxygenation: Pre-oxygenation with 100% oxygen Induction Type: IV induction LMA: LMA with gastric port inserted LMA Size: 5.0 Number of attempts: 1 Dental Injury: Teeth and Oropharynx as per pre-operative assessment

## 2019-10-03 NOTE — Discharge Instructions (Signed)

## 2019-10-03 NOTE — Telephone Encounter (Signed)
LVM for patient informing him to return our call to the office.

## 2019-10-03 NOTE — Op Note (Signed)
RIGHT TOTAL HIP ARTHROPLASTY ANTERIOR APPROACH  Procedure Note Marvin [name]   [id number]  Pre-op Diagnosis: right hip degenerative joint disease     Post-op Diagnosis: same   Operative Procedures  1. Total hip replacement; Right hip; uncemented [id number]   Personnel  Surgeon(s): Tarry Kos, MD  Assist: Oneal Grout, PA-C; necessary for the timely completion of procedure and due to complexity of procedure.   Anesthesia: general, local  Prosthesis: Depuy Acetabulum: Pinnacle 56 mm Femur: Actis 4 HO Head: 36 mm size: +5 Liner: +4 neutral Bearing Type: ceramic on poly  Total Hip Arthroplasty (Anterior Approach) Op Note:  After informed consent was obtained and the operative extremity marked in the holding area, the patient was brought back to the operating room and placed supine on the HANA table. Next, the operative extremity was prepped and draped in normal sterile fashion. Surgical timeout occurred verifying patient identification, surgical site, surgical procedure and administration of antibiotics.  A modified anterior Smith-Peterson approach to the hip was performed, using the interval between tensor fascia lata and sartorius.  Dissection was carried bluntly down onto the anterior hip capsule. The lateral femoral circumflex vessels were identified and coagulated. A capsulotomy was performed and the capsular flaps tagged for later repair.  The neck osteotomy was performed. The femoral head was removed, the acetabular rim was cleared of soft tissue and attention was turned to reaming the acetabulum.  Sequential reaming was performed under fluoroscopic guidance. We reamed to a size 55 mm, and then impacted the acetabular shell. A 25 mm cancellous screw was placed through the shell for added fixation.  The liner was then placed after irrigation and attention turned to the femur.  After placing the femoral hook, the leg was taken to externally rotated, extended and  adducted position taking care to perform soft tissue releases to allow for adequate mobilization of the femur. Soft tissue was cleared from the shoulder of the greater trochanter and the hook elevator used to improve exposure of the proximal femur. Sequential broaching performed up to a size 4. Trial neck and head were placed. The leg was brought back up to neutral and the construct reduced.  Antibiotic irrigation was placed in the surgical wound and kept for at least 1 minute.  The position and sizing of components, offset and leg lengths were checked using fluoroscopy. Stability of the construct was checked in extension and external rotation without any subluxation or impingement of prosthesis. We dislocated the prosthesis, dropped the leg back into position, removed trial components, and irrigated copiously. The final stem and head was then placed, the leg brought back up, the system reduced and fluoroscopy used to verify positioning.  We irrigated, obtained hemostasis and closed the capsule using #2 ethibond suture.  One gram of vancomycin powder was placed in the surgical bed. A dilute solution of 20 cc of normal saline, 1.3% exparel, 0.25% bupivacaine was injected in the soft tissues.  One gram of topical tranexamic acid was injected into the joint.  The fascia was closed with #1 vicryl plus, the deep fat layer was closed with 0 vicryl, the subcutaneous layers closed with 2.0 Vicryl Plus and the skin closed with 2.0 nylon and dermabond. A sterile dressing was applied. The patient was awakened in the operating room and taken to recovery in stable condition.  All sponge, needle, and instrument counts were correct at the end of the case.   Position: supine  Complications: see description of procedure.  Time  Out: performed   Drains/Packing: none  Estimated blood loss: see anesthesia record  Returned to Recovery Room: in good condition.   Antibiotics: yes   Mechanical VTE (DVT) Prophylaxis:  sequential compression devices, TED thigh-high  Chemical VTE (DVT) Prophylaxis: aspirin   Fluid Replacement: see anesthesia record  Specimens Removed: 1 to pathology   Sponge and Instrument Count Correct? yes   PACU: portable radiograph - low AP   Plan/RTC: Return in 2 weeks for staple removal. Weight Bearing/Load Lower Extremity: full  Hip precautions: none Suture Removal: 2 weeks   N. Glee Arvin, MD [facility] 12:11 PM   Implant Name Type Inv. Item Serial No. Manufacturer Lot No. LRB No. Used Action  PINN SECTOR W/GRIP ACE CUP 56 - [id number] Hips PINN SECTOR W/GRIP ACE CUP 56  [employer] [id number] Right 1 Implanted  PINNACLE ALTRX PLUS 4 N 36X56 - [id number] Hips PINNACLE ALTRX PLUS 4 N 36X56  [employer] [id number] Right 1 Implanted  SCREW 6.5MMX25MM - [id number] Screw SCREW 6.5MMX25MM  [employer] [id number] Right 1 Implanted  FEM STEM 12/14 TAPER SZ 4 HIP - [id number] Orthopedic Implant FEM STEM 12/14 TAPER SZ 4 HIP  [employer] [id number] Right 1 Implanted  HEAD CERAMIC 36 PLUS5 - [id number] Hips HEAD CERAMIC 36 PLUS5  [employer] [id number] Right 1 Implanted

## 2019-10-04 ENCOUNTER — Encounter (HOSPITAL_COMMUNITY): Payer: Self-pay | Admitting: Orthopaedic Surgery

## 2019-10-04 DIAGNOSIS — M1611 Unilateral primary osteoarthritis, right hip: Secondary | ICD-10-CM | POA: Diagnosis not present

## 2019-10-04 LAB — BASIC METABOLIC PANEL
Anion gap: 8 (ref 5–15)
BUN: 18 mg/dL (ref 8–23)
CO2: 24 mmol/L (ref 22–32)
Calcium: 8.7 mg/dL — ABNORMAL LOW (ref 8.9–10.3)
Chloride: 105 mmol/L (ref 98–111)
Creatinine, Ser: 1.06 mg/dL (ref 0.61–1.24)
GFR calc Af Amer: >60 mL/min (ref >60)
GFR calc non Af Amer: >60 mL/min (ref >60)
Glucose, Bld: 170 mg/dL — ABNORMAL HIGH (ref 70–99)
Potassium: 4.7 mmol/L (ref 3.5–5.1)
Sodium: 137 mmol/L (ref 135–145)

## 2019-10-04 LAB — CBC
HCT: 37.8 % — ABNORMAL LOW (ref 39.0–52.0)
Hemoglobin: 12.6 g/dL — ABNORMAL LOW (ref 13.0–17.0)
MCH: 28.9 pg (ref 26.0–34.0)
MCHC: 33.3 g/dL (ref 30.0–36.0)
MCV: 86.7 fL (ref 80.0–100.0)
Platelets: 182 10*3/uL (ref 150–400)
RBC: 4.36 MIL/uL (ref 4.22–5.81)
RDW: 14.1 % (ref 11.5–15.5)
WBC: 15.4 10*3/uL — ABNORMAL HIGH (ref 4.0–10.5)
nRBC: 0 % (ref 0.0–0.2)

## 2019-10-04 MED ORDER — OXYCODONE HCL ER 10 MG PO T12A: 10.0000 mg | EXTENDED_RELEASE_TABLET | Freq: Two times a day (BID) | ORAL | 0 refills | Status: DC

## 2019-10-04 NOTE — Plan of Care (Signed)
Plan of care reviewed and discussed with the patient. 

## 2019-10-04 NOTE — Discharge Summary (Signed)
Patient ID: Marvin [name] MRN: [id number] DOB/AGE: Jul 30, 1950 69 y.o.  Admit date: 10/03/2019 Discharge date: 10/04/2019  Admission Diagnoses:  Principal Problem:   Primary osteoarthritis of right hip Active Problems:   Status post total replacement of right hip   Discharge Diagnoses:  Same  Past Medical History:  Diagnosis Date  . Alcohol abuse   . Allergy   . Arthritis   . Drug abuse (HCC)   . Gout   . Hernia   . Hypercholesteremia   . Hypertension   . Wears dentures    bottom front    Surgeries: Procedure(s): RIGHT TOTAL HIP ARTHROPLASTY ANTERIOR APPROACH on 10/03/2019   Consultants:   Discharged Condition: Improved  Hospital Course: CORNELUIS ALLSTON is an 69 y.o. male who was admitted 10/03/2019 for operative treatment ofPrimary osteoarthritis of right hip. Patient has severe unremitting pain that affects sleep, daily activities, and work/hobbies. After pre-op clearance the patient was taken to the operating room on 10/03/2019 and underwent  Procedure(s): RIGHT TOTAL HIP ARTHROPLASTY ANTERIOR APPROACH.    Patient was given perioperative antibiotics:  Anti-infectives (From admission, onward)   Start     Dose/Rate Route Frequency Ordered Stop   10/03/19 1700  ceFAZolin (ANCEF) IVPB 2g/100 mL premix        2 g 200 mL/hr over 30 Minutes Intravenous Every 6 hours 10/03/19 1412 10/04/19 0653   10/03/19 1126  vancomycin (VANCOCIN) powder  Status:  Discontinued          As needed 10/03/19 1126 10/03/19 1409   10/03/19 0745  ceFAZolin (ANCEF) IVPB 2g/100 mL premix        2 g 200 mL/hr over 30 Minutes Intravenous On call to O.R. 10/03/19 0730 10/03/19 1113       Patient was given sequential compression devices, early ambulation, and chemoprophylaxis to prevent DVT.  Patient benefited maximally from hospital stay and there were no complications.    Recent vital signs:  Patient Vitals for the past 24 hrs:  BP Temp Temp src Pulse Resp SpO2 Height Weight  10/04/19  0805 118/87 -- -- (!) 59 -- -- -- --  10/04/19 0706 135/80 (!) 97.5 F (36.4 C) Oral (!) 57 18 98 % -- --  10/04/19 0329 131/77 (!) 97.5 F (36.4 C) Oral 64 16 98 % -- --  10/03/19 2314 (!) 158/85 (!) 97.5 F (36.4 C) Oral 72 18 100 % -- --  10/03/19 1816 (!) 148/86 97.8 F (36.6 C) Oral 82 16 99 % -- --  10/03/19 1618 (!) 155/103 97.9 F (36.6 C) -- 78 17 96 % -- --  10/03/19 1524 (!) 158/105 97.6 F (36.4 C) Oral 80 16 98 % -- --  10/03/19 1420 -- -- -- -- -- -- 5' 11.5" (1.816 m) 112.4 kg  10/03/19 1417 (!) 142/88 -- -- 77 14 99 % -- --  10/03/19 1345 (!) 153/86 98.2 F (36.8 C) -- 70 20 100 % -- --  10/03/19 1342 -- -- -- 75 19 97 % -- --  10/03/19 1330 139/82 -- -- 74 18 92 % -- --  10/03/19 1315 (!) 138/91 -- -- 74 (!) 22 98 % -- --  10/03/19 1300 (!) 135/93 -- -- 71 14 100 % -- --  10/03/19 1245 (!) 141/90 98 F (36.7 C) -- 77 19 (!) 89 % -- --     Recent laboratory studies:  Recent Labs    10/04/19 0314  WBC 15.4*  HGB 12.6*  HCT 37.8*  PLT 182  NA 137  K 4.7  CL 105  CO2 24  BUN 18  CREATININE 1.06  GLUCOSE 170*  CALCIUM 8.7*     Discharge Medications:   Allergies as of 10/04/2019      Reactions   Buprenorphine Hcl Itching   Morphine And Related Itching      Medication List    STOP taking these medications   cyclobenzaprine 10 MG tablet Commonly known as: FLEXERIL   meloxicam 15 MG tablet Commonly known as: MOBIC   traMADol 50 MG tablet Commonly known as: ULTRAM     TAKE these medications   aerochamber plus with mask inhaler Use as instructed   allopurinol 100 MG tablet Commonly known as: ZYLOPRIM Take 1 tablet (100 mg total) by mouth 2 (two) times daily. Needs to schedule f/up appt   amLODipine 5 MG tablet Commonly known as: NORVASC Take 2 tablets (10 mg total) by mouth every morning. May take 5mg  BID or 10mg  qd   aspirin EC 81 MG tablet Take 1 tablet (81 mg total) by mouth 2 (two) times daily. What changed: when to take this    fluticasone 50 MCG/ACT nasal spray Commonly known as: FLONASE SPRAY TWO SPRAYS IN EACH NOSTRIL ONCE DAILY AS DIRECTED What changed: See the new instructions.   methocarbamol 500 MG tablet Commonly known as: Robaxin Take 1 tablet (500 mg total) by mouth 3 (three) times daily as needed for muscle spasms.   ondansetron 4 MG tablet Commonly known as: Zofran Take 1 tablet (4 mg total) by mouth every 8 (eight) hours as needed for nausea or vomiting.   oxyCODONE 10 mg 12 hr tablet Commonly known as: OXYCONTIN Take 1 tablet (10 mg total) by mouth every 12 (twelve) hours.   oxyCODONE-acetaminophen 5-325 MG tablet Commonly known as: Percocet Take 1-2 tablets by mouth every 6 (six) hours as needed for severe pain.   pantoprazole 20 MG tablet Commonly known as: PROTONIX Take 20 mg by mouth daily.   simvastatin 20 MG tablet Commonly known as: ZOCOR Take 1 tablet (20 mg total) by mouth 3 (three) times a week. Take on Monday, Wednesday, and Friday What changed:   when to take this  additional instructions            Durable Medical Equipment  (From admission, onward)         Start     Ordered   10/03/19 1413  DME Walker rolling  Once       Question:  Patient needs a walker to treat with the following condition  Answer:  History of hip replacement   10/03/19 1412   10/03/19 1413  DME 3 n 1  Once        10/03/19 1412   10/03/19 1413  DME Bedside commode  Once       Question:  Patient needs a bedside commode to treat with the following condition  Answer:  History of hip replacement   10/03/19 1412          Diagnostic Studies: DG Chest 2 View  Result Date: 09/30/2019 CLINICAL DATA:  Preoperative examination. Patient for right hip replacement. EXAM: CHEST - 2 VIEW COMPARISON:  PA and lateral chest 04/03/2015. FINDINGS: Eventration of the posterior aspect of the left hemidiaphragm is unchanged. Lungs clear. Heart size normal. No pneumothorax or pleural fluid. No acute or  focal bony abnormality. IMPRESSION: No acute disease. Electronically Signed   By: 10/02/2019 M.D.   On: 09/30/2019  14:05   DG Pelvis Portable  Result Date: 10/03/2019 CLINICAL DATA:  Post RIGHT hip arthroplasty EXAM: PORTABLE PELVIS 1-2 VIEWS COMPARISON:  Portable exam at 1314 hrs compared to intraoperative images of same date FINDINGS: Oseous mineralization normal. RIGHT hip prosthesis identified. No acute fracture or dislocation. Ice bag projects over hip region. IMPRESSION: RIGHT hip prosthesis without acute complication. Electronically Signed   By: Ulyses Southward M.D.   On: 10/03/2019 13:31   DG C-Arm 1-60 Min-No Report  Result Date: 10/03/2019 Fluoroscopy was utilized by the requesting physician.  No radiographic interpretation.   DG HIP OPERATIVE UNILAT W OR W/O PELVIS RIGHT  Result Date: 10/03/2019 CLINICAL DATA:  Right hip replacement EXAM: OPERATIVE right HIP (WITH PELVIS IF PERFORMED) 2 VIEWS TECHNIQUE: Fluoroscopic spot image(s) were submitted for interpretation post-operatively. COMPARISON:  08/30/2019 FINDINGS: Right hip replacement in satisfactory position and alignment. No fracture or complication. IMPRESSION: Satisfactory right hip replacement Electronically Signed   By: Marlan Palau M.D.   On: 10/03/2019 12:53    Disposition: Discharge disposition: 01-Home or Self Care          Follow-up Information    Tarry Kos, MD Follow up in 2 week(s).   Specialty: Orthopedic Surgery Contact information: [address] [place] [address] [phone]                Signed: Cristie Hem 10/04/2019, 8:07 AM

## 2019-10-04 NOTE — TOC Initial Note (Signed)
Transition of Care [facility]) - Initial/Assessment Note    Patient Details  Name: Marvin [name] MRN: [id number] Date of Birth: 1950/12/11  [facility] CM/SW Contact:    Armanda Heritage, RN Phone Number: 10/04/2019, 11:19 AM  Clinical Narrative:    CM spoke with patient at bedside.  [employer] to provide RW.  CM called referral for home health to [facility] the designated care coordination agency for [place].  [facility] to arrange home health with in network provider, all requested clinicals faxed.                Expected Discharge Plan: Home w Home Health Services Barriers to Discharge: No Barriers Identified   Patient Goals and CMS Choice Patient states their goals for this hospitalization and ongoing recovery are:: to go home with home health CMS Medicare.gov Compare Post Acute Care list provided to:: Patient Choice offered to / list presented to : Patient  Expected Discharge Plan and Services Expected Discharge Plan: Home w Home Health Services   Discharge Planning Services: CM Consult Post Acute Care Choice: Home Health Living arrangements for the past 2 months: Single Family Home Expected Discharge Date: 10/04/19               DME Arranged: Dan Humphreys rolling DME Agency: [employer] Date DME Agency Contacted: 10/04/19 Time DME Agency Contacted: 1118 Representative spoke with at DME Agency: Harrold Donath HH Arranged: PT HH Agency: Other - See comment (referral called to [facility])        Prior Living Arrangements/Services Living arrangements for the past 2 months: Single Family Home   Patient language and need for interpreter reviewed:: Yes Do you feel safe going back to the place where you live?: Yes      Need for Family Participation in Patient Care: Yes (Comment) Care giver support system in place?: Yes (comment)   Criminal Activity/Legal Involvement Pertinent to Current Situation/Hospitalization: No - Comment as needed  Activities of Daily Living Home Assistive  Devices/Equipment: Eyeglasses, Dentures (specify type) (lower partial) ADL Screening (condition at time of admission) Patient's cognitive ability adequate to safely complete daily activities?: Yes Is the patient deaf or have difficulty hearing?: No Does the patient have difficulty seeing, even when wearing glasses/contacts?: No Does the patient have difficulty concentrating, remembering, or making decisions?: No Patient able to express need for assistance with ADLs?: Yes Does the patient have difficulty dressing or bathing?: No Independently performs ADLs?: Yes (appropriate for developmental age) Does the patient have difficulty walking or climbing stairs?: No Weakness of Legs: None Weakness of Arms/Hands: None  Permission Sought/Granted                  Emotional Assessment Appearance:: Appears stated age Attitude/Demeanor/Rapport: Engaged Affect (typically observed): Accepting Orientation: : Oriented to Self, Oriented to Place, Oriented to  Time, Oriented to Situation   Psych Involvement: No (comment)  Admission diagnosis:  Status post total replacement of right hip [Z96.641] Patient Active Problem List   Diagnosis Date Noted  . Primary osteoarthritis of right hip 10/03/2019  . 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  . Chronic bilateral low back pain without sciatica 02/21/2019  . Gastroesophageal reflux disease 02/23/2017  . Gout 05/20/2016  . HTN (hypertension) 09/11/2015  . Hyperlipidemia 09/11/2015   PCP:  Anne Ng, NP Pharmacy:   [facility] [address], [place] - [address] [place] [address] Phone: [phone] Fax: [phone]  [facility] [phone] - [place],   - [address] [place] [address] Phone: [phone] Fax: [phone]     Social Determinants of Health (SDOH) Interventions    Readmission Risk Interventions No flowsheet data  found.

## 2019-10-04 NOTE — Progress Notes (Signed)
Physical Therapy Treatment Patient Details Name: Marvin [name] MRN: [id number] DOB: [birth year] Today's Date: 10/04/2019    History of Present Illness s/p R DA THA. 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 incr gait distance, tol well. Will see again per PA request and pt will be ready to d/c later this afternoon   Follow Up Recommendations  Follow surgeon's recommendation for DC plan and follow-up therapies     Equipment Recommendations  Rolling walker with 5" wheels    Recommendations for Other Services       Precautions / Restrictions Precautions Precautions: None Restrictions Weight Bearing Restrictions: No Other Position/Activity Restrictions: WBAT    Mobility  Bed Mobility Overal bed mobility: Needs Assistance Bed Mobility: Supine to Sit     Supine to sit: Modified independent (Device/Increase time);HOB elevated        Transfers Overall transfer level: Needs assistance Equipment used: Rolling walker (2 wheeled) Transfers: Sit to/from Stand Sit to Stand: Supervision         General transfer comment: cues for hand placement  Ambulation/Gait Ambulation/Gait assistance: Supervision Gait Distance (Feet): 360 Feet Assistive device: Rolling walker (2 wheeled) Gait Pattern/deviations: Step-to pattern;Step-through pattern;Decreased stance time - right     General Gait Details: cues for sequence initially   Stairs Stairs: Yes Stairs assistance: Min guard;Supervision Stair Management: Two rails;Forwards;Step to pattern;Alternating pattern Number of Stairs: 5 (x2) General stair comments: cues for sequence    Wheelchair Mobility    Modified Rankin (Stroke Patients Only)       Balance                                            Cognition Arousal/Alertness: Awake/alert Behavior During Therapy: WFL for tasks assessed/performed Overall Cognitive Status: Within Functional Limits for tasks  assessed                                        Exercises      General Comments        Pertinent Vitals/Pain Pain Assessment: 0-10 Pain Score: 3  Pain Location: right hip Pain Descriptors / Indicators: Sore Pain Intervention(s): Limited activity within patient's tolerance;Monitored during session;Repositioned;Ice applied    Home Living                      Prior Function            PT Goals (current goals can now be found in the care plan section) Acute Rehab PT Goals Patient Stated Goal: home PT Goal Formulation: With patient Time For Goal Achievement: 10/10/19 Potential to Achieve Goals: Good Progress towards PT goals: Progressing toward goals    Frequency    7X/week      PT Plan Current plan remains appropriate    Co-evaluation              AM-PAC PT "6 Clicks" Mobility   Outcome Measure  Help needed turning from your back to your side while in a flat bed without using bedrails?: None Help needed moving from lying on your back to sitting on the side of a flat bed without using bedrails?: None Help needed moving to and from a bed to a chair (including a  wheelchair)?: A Little Help needed standing up from a chair using your arms (e.g., wheelchair or bedside chair)?: A Little Help needed to walk in hospital room?: A Little Help needed climbing 3-5 steps with a railing? : A Little 6 Click Score: 20    End of Session Equipment Utilized During Treatment: Gait belt Activity Tolerance: Patient tolerated treatment well Patient left: with call bell/phone within reach;with chair alarm set;in chair   PT Visit Diagnosis: Difficulty in walking, not elsewhere classified (R26.2)     Time: 0973-5329 PT Time Calculation (min) (ACUTE ONLY): 26 min  Charges:  $Gait Training: 23-37 mins                     Delice Bison, PT  Acute Rehab Dept ([facility]) [phone] Pager [phone]  10/04/2019    [facility] 10/04/2019, 10:35 AM

## 2019-10-04 NOTE — Progress Notes (Signed)
Subjective: 1 Day Post-Op Procedure(s) (LRB): RIGHT TOTAL HIP ARTHROPLASTY ANTERIOR APPROACH (Right) Patient reports pain as mild.    Objective: Vital signs in last 24 hours: Temp:  [97.5 F (36.4 C)-98.2 F (36.8 C)] 97.5 F (36.4 C) (08/17 0706) Pulse Rate:  [57-82] 57 (08/17 0706) Resp:  [14-22] 18 (08/17 0706) BP: (131-158)/(77-105) 135/80 (08/17 0706) SpO2:  [89 %-100 %] 98 % (08/17 0706) Weight:  [112.4 kg] 112.4 kg (08/16 1420)  Intake/Output from previous day: 08/16 0701 - 08/17 0700 In: 2899.3 [P.O.:600; I.V.:2099.3; IV Piggyback:200] Out: 2650 [Urine:2350; Blood:300] Intake/Output this shift: No intake/output data recorded.  Recent Labs    10/04/19 0314  HGB 12.6*   Recent Labs    10/04/19 0314  WBC 15.4*  RBC 4.36  HCT 37.8*  PLT 182   Recent Labs    10/04/19 0314  NA 137  K 4.7  CL 105  CO2 24  BUN 18  CREATININE 1.06  GLUCOSE 170*  CALCIUM 8.7*   No results for input(s): LABPT, INR in the last 72 hours.  Neurologically intact Neurovascular intact Sensation intact distally Intact pulses distally Dorsiflexion/Plantar flexion intact Incision: dressing C/D/I No cellulitis present Compartment soft   Assessment/Plan: 1 Day Post-Op Procedure(s) (LRB): RIGHT TOTAL HIP ARTHROPLASTY ANTERIOR APPROACH (Right) Advance diet Up with therapy D/C IV fluids Discharge home with home health after second PT session WBAT RLE ABLA- mild and stable D/c foley     Cristie Hem 10/04/2019, 8:04 AM

## 2019-10-04 NOTE — Progress Notes (Signed)
Pt provided with d/c instructions. After discussing the pt's plan of care upon d/c home, pt denied any further questions or concerns.  

## 2019-10-04 NOTE — Progress Notes (Signed)
Physical Therapy Treatment Patient Details Name: Marvin [name] MRN: [id number] DOB: 02-Oct-1950 Today's Date: 10/04/2019    History of Present Illness s/p R DA THA. 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 PT goals met. Reviewed THA HEP.  Ready for d/c home with spouse assist prn.   Follow Up Recommendations  Follow surgeon's recommendation for DC plan and follow-up therapies     Equipment Recommendations  Rolling walker with 5" wheels    Recommendations for Other Services       Precautions / Restrictions Precautions Precautions: None Restrictions Other Position/Activity Restrictions: WBAT    Mobility  Bed Mobility               General bed mobility comments: in chair on arrival   Transfers Overall transfer level: Needs assistance Equipment used: Rolling walker (2 wheeled) Transfers: Sit to/from Stand Sit to Stand: Supervision         General transfer comment: cues for hand placement  Ambulation/Gait Ambulation/Gait assistance: Supervision;Modified independent (Device/Increase time) Gait Distance (Feet): 380 Feet Assistive device: Rolling walker (2 wheeled) Gait Pattern/deviations: Step-through pattern;Decreased stance time - right     General Gait Details: for safety initially   Stairs             Wheelchair Mobility    Modified Rankin (Stroke Patients Only)       Balance                                            Cognition Arousal/Alertness: Awake/alert Behavior During Therapy: WFL for tasks assessed/performed Overall Cognitive Status: Within Functional Limits for tasks assessed                                        Exercises Total Joint Exercises Hip ABduction/ADduction: AROM;Right;10 reps;Standing Knee Flexion: AROM;Right;10 reps;Standing Marching in Standing: AROM;Both;10 reps;Standing Standing Hip Extension: AROM;Right;10 reps;Standing     General Comments        Pertinent Vitals/Pain Pain Assessment: 0-10 Pain Score: 4  Pain Location: right hip Pain Descriptors / Indicators: Sore Pain Intervention(s): Limited activity within patient's tolerance;Monitored during session;Premedicated before session;Repositioned    Home Living                      Prior Function            PT Goals (current goals can now be found in the care plan section) Acute Rehab PT Goals Patient Stated Goal: home PT Goal Formulation: With patient Time For Goal Achievement: 10/10/19 Potential to Achieve Goals: Good Progress towards PT goals: Progressing toward goals    Frequency    7X/week      PT Plan Current plan remains appropriate    Co-evaluation              AM-PAC PT "6 Clicks" Mobility   Outcome Measure  Help needed turning from your back to your side while in a flat bed without using bedrails?: None Help needed moving from lying on your back to sitting on the side of a flat bed without using bedrails?: None Help needed moving to and from a bed to a chair (including a wheelchair)?: A Little Help needed standing up  from a chair using your arms (e.g., wheelchair or bedside chair)?: A Little Help needed to walk in hospital room?: A Little Help needed climbing 3-5 steps with a railing? : A Little 6 Click Score: 20    End of Session Equipment Utilized During Treatment: Gait belt Activity Tolerance: Patient tolerated treatment well Patient left: with call bell/phone within reach;with chair alarm set;in chair   PT Visit Diagnosis: Difficulty in walking, not elsewhere classified (R26.2)     Time: 6184-8592 PT Time Calculation (min) (ACUTE ONLY): 25 min  Charges:  $Gait Training: 8-22 mins $Therapeutic Exercise: 8-22 mins                     Baxter Flattery, PT  Acute Rehab Dept ([place]) [phone] Pager [phone]  10/04/2019    [facility] 10/04/2019, 2:36 PM

## 2019-10-06 DIAGNOSIS — E785 Hyperlipidemia, unspecified: Secondary | ICD-10-CM | POA: Diagnosis not present

## 2019-10-06 DIAGNOSIS — M545 Low back pain: Secondary | ICD-10-CM | POA: Diagnosis not present

## 2019-10-06 DIAGNOSIS — Z471 Aftercare following joint replacement surgery: Secondary | ICD-10-CM | POA: Diagnosis not present

## 2019-10-06 DIAGNOSIS — G8929 Other chronic pain: Secondary | ICD-10-CM | POA: Diagnosis not present

## 2019-10-06 DIAGNOSIS — R69 Illness, unspecified: Secondary | ICD-10-CM | POA: Diagnosis not present

## 2019-10-06 DIAGNOSIS — I10 Essential (primary) hypertension: Secondary | ICD-10-CM | POA: Diagnosis not present

## 2019-10-06 DIAGNOSIS — Z96641 Presence of right artificial hip joint: Secondary | ICD-10-CM | POA: Diagnosis not present

## 2019-10-06 DIAGNOSIS — K219 Gastro-esophageal reflux disease without esophagitis: Secondary | ICD-10-CM | POA: Diagnosis not present

## 2019-10-06 DIAGNOSIS — M109 Gout, unspecified: Secondary | ICD-10-CM | POA: Diagnosis not present

## 2019-10-06 DIAGNOSIS — Z96651 Presence of right artificial knee joint: Secondary | ICD-10-CM | POA: Diagnosis not present

## 2019-10-10 ENCOUNTER — Telehealth: Payer: Self-pay | Admitting: Orthopaedic Surgery

## 2019-10-10 NOTE — Telephone Encounter (Signed)
Pls advise.  

## 2019-10-10 NOTE — Telephone Encounter (Signed)
Patient called requesting refills of the following medications. Patient asked that ox[facility]codone go to [facility] because [facility]'t carry this med and methocarbamol and oxycontin go to [facility]. If you have have questions for patient please call at [phone].

## 2019-10-11 ENCOUNTER — Telehealth: Payer: Self-pay

## 2019-10-11 ENCOUNTER — Other Ambulatory Visit: Payer: Self-pay | Admitting: Physician Assistant

## 2019-10-11 ENCOUNTER — Other Ambulatory Visit: Payer: Self-pay

## 2019-10-11 DIAGNOSIS — M1611 Unilateral primary osteoarthritis, right hip: Secondary | ICD-10-CM

## 2019-10-11 MED ORDER — METHOCARBAMOL 500 MG PO TABS: 500.0000 mg | ORAL_TABLET | Freq: Three times a day (TID) | ORAL | 0 refills | Status: DC | PRN

## 2019-10-11 MED ORDER — OXYCODONE-ACETAMINOPHEN 5-325 MG PO TABS: 1.0000 | ORAL_TABLET | Freq: Three times a day (TID) | ORAL | 0 refills | Status: DC | PRN

## 2019-10-11 NOTE — Telephone Encounter (Signed)
I sent in the oxycodone and robaxin.  We cannot refill oxycontin

## 2019-10-11 NOTE — Telephone Encounter (Signed)
Spoke with patient and advised

## 2019-10-11 NOTE — Telephone Encounter (Signed)
Patient called stating that his right side, calf, and knee are swollen, and hot to the touch.  Patient had right total hip on 10/03/2019.  Would like to know if this is normal?  Stated that he has been using ice.  CB# [phone].  Please advise.  Thank you.

## 2019-10-11 NOTE — Telephone Encounter (Signed)
Please send for doppler to rule out DVT

## 2019-10-11 NOTE — Telephone Encounter (Signed)
Patient aware.

## 2019-10-12 ENCOUNTER — Telehealth: Payer: Self-pay | Admitting: Orthopaedic Surgery

## 2019-10-12 ENCOUNTER — Telehealth: Payer: Self-pay

## 2019-10-12 ENCOUNTER — Other Ambulatory Visit: Payer: Self-pay

## 2019-10-12 ENCOUNTER — Ambulatory Visit (HOSPITAL_COMMUNITY)
Admission: RE | Admit: 2019-10-12 | Discharge: 2019-10-12 | Disposition: A | Discharge status: Home / Self Care | Payer: Managed Care, Other (non HMO) | Source: Ambulatory Visit | Attending: Orthopaedic Surgery | Admitting: Orthopaedic Surgery

## 2019-10-12 DIAGNOSIS — M1611 Unilateral primary osteoarthritis, right hip: Secondary | ICD-10-CM | POA: Diagnosis not present

## 2019-10-12 NOTE — Progress Notes (Addendum)
Lower extremity venous RT study completed  Preliminary results relayed to [name] for Roda Shutters, MD.  See CV Proc for preliminary results report.   Marvin [name]

## 2019-10-12 NOTE — Telephone Encounter (Signed)
Is there anyway that I could get you to help [place] with this?

## 2019-10-12 NOTE — Telephone Encounter (Signed)
Vascular tech from [facility] Rachael called to report results of dopper DVT. Patient was negative for DVT. Rachael phone number is [phone].

## 2019-10-12 NOTE — Telephone Encounter (Signed)
FYI  Please advise if you would like for me to do anything further.

## 2019-10-12 NOTE — Telephone Encounter (Signed)
Paient's wife called stating that she was waiting on someone to call her to schedule an appointment for patient to have DVT.  Per Ezzie Dural, Dr. Warren Danes assistant, patient will receive a phone call today for DVT.  Patient's wife stated that she understands.

## 2019-10-12 NOTE — Telephone Encounter (Signed)
I'll call right now and see what I can do.

## 2019-10-12 NOTE — Telephone Encounter (Signed)
[email or web address]  Care Coordinator called requesting a call back. She is looking for Home Health for patient that both insurances are in network for patient. Patient is needing PT home health care. Kathie Rhodes is asking to be called back for these verbal orders with finding a facility that excepts dual insurance. Please call Kathie Rhodes at [phone] press option 7 and dial ext [phone].

## 2019-10-13 ENCOUNTER — Telehealth: Payer: Self-pay | Admitting: *Deleted

## 2019-10-13 ENCOUNTER — Telehealth: Payer: Self-pay | Admitting: Orthopaedic Surgery

## 2019-10-13 NOTE — Telephone Encounter (Signed)
RNCM requested to help with setting up home health due to [facility] being out of network with [place]. Spoke with Lianne Bushy with [employer]. She mentioned that she had made referral to [facility], who was to admit yesterday, but patient going to Vascular office today to have doppler study to rule out DVT. Negative for DVT. Spoke with Mr. Pridgen today and he confirmed [facility] hadd been out to admit for PT services today. Updated Lianne Bushy with his insurance carrier today as well.

## 2019-10-13 NOTE — Telephone Encounter (Signed)
Marvin [name] from [facility] called.   Marvin [name] they are not within network of the [employer]. The patient is aware and has declined any future appointments until he is in the care of a provider his insurance will cover.   [facility] call back: [phone] Patient call back: [phone]

## 2019-10-14 NOTE — Telephone Encounter (Signed)
Yes, [facility] was able to admit to HHPT. It is all taken care of. There's a note in the chart.

## 2019-10-14 NOTE — Telephone Encounter (Signed)
Have you spoken to them?

## 2019-10-18 ENCOUNTER — Ambulatory Visit (INDEPENDENT_AMBULATORY_CARE_PROVIDER_SITE_OTHER): Payer: Managed Care, Other (non HMO) | Admitting: Physician Assistant

## 2019-10-18 ENCOUNTER — Encounter: Payer: Self-pay | Admitting: Orthopaedic Surgery

## 2019-10-18 VITALS — Ht 71.5 in | Wt 247.0 lb

## 2019-10-18 DIAGNOSIS — Z96641 Presence of right artificial hip joint: Secondary | ICD-10-CM

## 2019-10-18 MED ORDER — OXYCODONE-ACETAMINOPHEN 5-325 MG PO TABS: 1.0000 | ORAL_TABLET | Freq: Two times a day (BID) | ORAL | 0 refills | Status: DC | PRN

## 2019-10-18 MED ORDER — METHOCARBAMOL 500 MG PO TABS: 500.0000 mg | ORAL_TABLET | Freq: Two times a day (BID) | ORAL | 0 refills | Status: DC | PRN

## 2019-10-18 NOTE — Progress Notes (Signed)
Post-Op Visit Note   Patient: Marvin [name]           Date of Birth: 05-Nov-1950           MRN: [id number] Visit Date: 10/18/2019 PCP: Anne Ng, NP   Assessment & Plan:  Chief Complaint:  Chief Complaint  Patient presents with  . Right Hip - Routine Post Op    Right total hip arthroplasty 10/03/2019   Visit Diagnoses:  1. Status post total hip replacement, right     Plan: Patient is a pleasant 69 year old gentleman who comes in today 2 weeks out right anterior total hip replacement 10/03/2019.  He has been doing well.  He is ambulating with a walker in public and sometimes without any assistance at home.  He has been getting home health physical therapy.  Examination of his right hip reveals a well-healing surgical incision with nylon sutures in place.  No evidence of infection or cellulitis.  Calves are soft and nontender.  At this point, he will continue to advance with therapy.  Sutures were removed and Steri-Strips applied.  Dental prophylaxis reinforced.  Follow-up with [place] in 4 weeks time for repeat evaluation AP pelvis lateral right hip x-rays.  Follow-Up Instructions: Return in about 4 weeks (around 11/15/2019).   Orders:  No orders of the defined types were placed in this encounter.  Meds ordered this encounter  Medications  . oxyCODONE-acetaminophen (PERCOCET) 5-325 MG tablet    Sig: Take 1-2 tablets by mouth 2 (two) times daily as needed for severe pain.    Dispense:  30 tablet    Refill:  0  . methocarbamol (ROBAXIN) 500 MG tablet    Sig: Take 1 tablet (500 mg total) by mouth 2 (two) times daily as needed.    Dispense:  20 tablet    Refill:  0    Imaging: No new imaging  PMFS History: Patient Active Problem List   Diagnosis Date Noted  . Primary osteoarthritis of right hip 10/03/2019  . 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  . Chronic bilateral low back pain without sciatica 02/21/2019  . Gastroesophageal reflux disease  02/23/2017  . Gout 05/20/2016  . HTN (hypertension) 09/11/2015  . Hyperlipidemia 09/11/2015   Past Medical History:  Diagnosis Date  . Alcohol abuse   . Allergy   . Arthritis   . Drug abuse (HCC)   . Gout   . Hernia   . Hypercholesteremia   . Hypertension   . Wears dentures    bottom front    Family History  Problem Relation Age of Onset  . Healthy Mother   . Healthy Father   . Cancer Brother        bone cancer  . Cancer Maternal Aunt        breast cancer  . Cancer Maternal Uncle        lukemia  . Cancer Cousin        unknown  . Cancer Maternal Uncle        lukemia  . Cancer Paternal Uncle        prostate  . Hearing loss Maternal Grandfather 45    Past Surgical History:  Procedure Laterality Date  . HERNIA REPAIR     twice  . REPLACEMENT TOTAL KNEE     partial  . SHOULDER SURGERY     right  . TOTAL HIP ARTHROPLASTY Right 10/03/2019   Procedure: RIGHT TOTAL HIP ARTHROPLASTY ANTERIOR APPROACH;  Surgeon: Roda Shutters,  Edwin Cap, MD;  Location: [facility];  Service: Orthopedics;  Laterality: Right;   Social History   Occupational History  . Not on file  Tobacco Use  . Smoking status: Former Smoker    Types: Cigarettes    Quit date: 03/20/1998    Years since quitting: 21.5  . Smokeless tobacco: Former Neurosurgeon    Types: Engineer, drilling  . Vaping Use: Never used  Substance and Sexual Activity  . Alcohol use: No  . Drug use: No  . Sexual activity: Yes    Birth control/protection: Condom

## 2019-10-26 ENCOUNTER — Other Ambulatory Visit: Payer: Self-pay | Admitting: Physician Assistant

## 2019-10-26 ENCOUNTER — Telehealth: Payer: Self-pay | Admitting: Orthopaedic Surgery

## 2019-10-26 MED ORDER — OXYCODONE-ACETAMINOPHEN 5-325 MG PO TABS: 1.0000 | ORAL_TABLET | Freq: Two times a day (BID) | ORAL | 0 refills | Status: DC | PRN

## 2019-10-26 NOTE — Telephone Encounter (Signed)
I sent in

## 2019-10-26 NOTE — Telephone Encounter (Signed)
Patient called.   He is requesting a refill on his oxycodone be called in. Asked that it be the 40 ct instead of 30   Call back: [phone]

## 2019-10-26 NOTE — Telephone Encounter (Signed)
Please advise 

## 2019-11-04 ENCOUNTER — Other Ambulatory Visit: Payer: Self-pay | Admitting: Physician Assistant

## 2019-11-04 ENCOUNTER — Telehealth: Payer: Self-pay | Admitting: Orthopaedic Surgery

## 2019-11-04 NOTE — Telephone Encounter (Signed)
Can you send this into her pharm.  

## 2019-11-04 NOTE — Telephone Encounter (Signed)
Pt would like to have a refill called in for his percocet's and muscle relaxer so that it will be ready for pick up when he runs out on Monday 11/07/19; pt would like a CB when this has been done please   [phone]

## 2019-11-07 ENCOUNTER — Other Ambulatory Visit: Payer: Self-pay | Admitting: Physician Assistant

## 2019-11-07 MED ORDER — HYDROCODONE-ACETAMINOPHEN 5-325 MG PO TABS: 1.0000 | ORAL_TABLET | Freq: Two times a day (BID) | ORAL | 0 refills | Status: DC | PRN

## 2019-11-07 MED ORDER — METHOCARBAMOL 500 MG PO TABS: 500.0000 mg | ORAL_TABLET | Freq: Two times a day (BID) | ORAL | 0 refills | Status: DC | PRN

## 2019-11-07 NOTE — Telephone Encounter (Signed)
Weaning to norco and I just sent in

## 2019-11-07 NOTE — Telephone Encounter (Signed)
Patient aware.

## 2019-11-11 ENCOUNTER — Encounter: Payer: Self-pay | Admitting: Nurse Practitioner

## 2019-11-11 ENCOUNTER — Ambulatory Visit (INDEPENDENT_AMBULATORY_CARE_PROVIDER_SITE_OTHER): Payer: Managed Care, Other (non HMO) | Admitting: Nurse Practitioner

## 2019-11-11 ENCOUNTER — Other Ambulatory Visit: Payer: Self-pay

## 2019-11-11 VITALS — BP 128/82 | HR 78 | Temp 97.0°F | Ht 71.0 in | Wt 250.0 lb

## 2019-11-11 DIAGNOSIS — I10 Essential (primary) hypertension: Secondary | ICD-10-CM

## 2019-11-11 DIAGNOSIS — E119 Type 2 diabetes mellitus without complications: Secondary | ICD-10-CM

## 2019-11-11 DIAGNOSIS — R739 Hyperglycemia, unspecified: Secondary | ICD-10-CM | POA: Insufficient documentation

## 2019-11-11 DIAGNOSIS — E782 Mixed hyperlipidemia: Secondary | ICD-10-CM | POA: Diagnosis not present

## 2019-11-11 DIAGNOSIS — Z23 Encounter for immunization: Secondary | ICD-10-CM

## 2019-11-11 MED ORDER — AMLODIPINE BESYLATE 10 MG PO TABS: 10.0000 mg | ORAL_TABLET | Freq: Every day | ORAL | 3 refills | Status: DC

## 2019-11-11 MED ORDER — SIMVASTATIN 20 MG PO TABS: 20.0000 mg | ORAL_TABLET | ORAL | 3 refills | Status: DC

## 2019-11-11 NOTE — Assessment & Plan Note (Addendum)
ASCVD risk in 13yrs: 17.5% LDL at 128 at this time. No myalgia with crestor 2x/week. Prediabetic with HgbA1c at 6.3 No known CAD/artherosclerosis/PAD. BP at goal  Continue current medication Repeat CK total and lipid panel in 38months.

## 2019-11-11 NOTE — Progress Notes (Signed)
Subjective:  Patient ID: Marvin [name], male    DOB: 1951/02/08  Age: 69 y.o. MRN: [id number]  CC: Follow-up (F/U after hip surgery, pt states he needs some clarity for his BP medication )  HPI  HTN (hypertension) BP at goal with amlodipine 10mg  BP Readings from Last 3 Encounters:  11/11/19 128/82  10/04/19 139/84  09/30/19 (!) 148/108   Maintain current medication f/up in 30months  Hyperlipidemia ASCVD risk in 42yrs: 17.5% LDL at 128 at this time. No myalgia with crestor 2x/week. Prediabetic with HgbA1c at 6.3 No known CAD/artherosclerosis/PAD. BP at goal  Continue current medication Repeat CK total and lipid panel in 35months.   Type 2 diabetes mellitus without complication, without long-term current use of insulin (HCC) Controlled with diet HgbA1c of 6.3 Up to date with eye exam: no DM retinopathy. No peripheral neuropathy. F/up in 53months  Wt Readings from Last 3 Encounters:  11/11/19 250 lb (113.4 kg)  10/18/19 247 lb (112 kg)  10/03/19 247 lb 13.8 oz (112.4 kg)    Reviewed past Medical, Social and Family history today.  Outpatient Medications Prior to Visit  Medication Sig Dispense Refill  . allopurinol (ZYLOPRIM) 100 MG tablet Take 1 tablet (100 mg total) by mouth 2 (two) times daily. Needs to schedule f/up appt 180 tablet 1  . aspirin EC 81 MG tablet Take 1 tablet (81 mg total) by mouth 2 (two) times daily. 84 tablet 11  . fluticasone (FLONASE) 50 MCG/ACT nasal spray SPRAY TWO SPRAYS IN EACH NOSTRIL ONCE DAILY AS DIRECTED (Patient taking differently: Place 2 sprays into both nostrils daily. ) 16 g 1  . HYDROcodone-acetaminophen (NORCO) 5-325 MG tablet Take 1-2 tablets by mouth 2 (two) times daily as needed. 30 tablet 0  . ondansetron (ZOFRAN) 4 MG tablet Take 1 tablet (4 mg total) by mouth every 8 (eight) hours as needed for nausea or vomiting. 40 tablet 0  . amLODipine (NORVASC) 5 MG tablet Take 2 tablets (10 mg total) by mouth every morning. May take  5mg  BID or 10mg  qd 180 tablet 3  . simvastatin (ZOCOR) 20 MG tablet Take 1 tablet (20 mg total) by mouth 3 (three) times a week. Take on Monday, Wednesday, and Friday (Patient taking differently: Take 20 mg by mouth 2 (two) times a week. ) 90 tablet 3  . methocarbamol (ROBAXIN) 500 MG tablet Take 1 tablet (500 mg total) by mouth 3 (three) times daily as needed for muscle spasms. 30 tablet 0  . methocarbamol (ROBAXIN) 500 MG tablet Take 1 tablet (500 mg total) by mouth 2 (two) times daily as needed. (Patient not taking: Reported on 11/11/2019) 20 tablet 0  . oxyCODONE (OXYCONTIN) 10 mg 12 hr tablet Take 1 tablet (10 mg total) by mouth every 12 (twelve) hours. 10 tablet 0  . oxyCODONE-acetaminophen (PERCOCET) 5-325 MG tablet Take 1-2 tablets by mouth 2 (two) times daily as needed for severe pain. (Patient not taking: Reported on 11/11/2019) 40 tablet 0  . pantoprazole (PROTONIX) 20 MG tablet Take 20 mg by mouth daily.  (Patient not taking: Reported on 11/11/2019) 90 tablet 1  . Spacer/Aero-Holding Chambers (AEROCHAMBER PLUS WITH MASK) inhaler Use as instructed 1 each 2   No facility-administered medications prior to visit.    ROS See HPI  Objective:  BP 128/82 (BP Location: Left Arm, Patient Position: Sitting, Cuff Size: Normal)   Pulse 78   Temp (!) 97 F (36.1 C) (Temporal)   Ht 5\' 11"  (1.803 m)  Wt 250 lb (113.4 kg)   SpO2 99%   BMI 34.87 kg/m   Physical Exam Constitutional:      Appearance: He is obese.  Cardiovascular:     Rate and Rhythm: Normal rate and regular rhythm.     Pulses: Normal pulses.     Heart sounds: Normal heart sounds.  Pulmonary:     Effort: Pulmonary effort is normal.     Breath sounds: Normal breath sounds.  Musculoskeletal:     Right lower leg: No edema.     Left lower leg: No edema.  Neurological:     Mental Status: He is alert and oriented to person, place, and time.     Assessment & Plan:  This visit occurred during the SARS-CoV-2 public health  emergency.  Safety protocols were in place, including screening questions prior to the visit, additional usage of staff PPE, and extensive cleaning of exam room while observing appropriate contact time as indicated for disinfecting solutions.   [name] was seen today for follow-up.  Diagnoses and all orders for this visit:  Essential hypertension -     amLODipine (NORVASC) 10 MG tablet; Take 1 tablet (10 mg total) by mouth daily.  Influenza vaccine needed -     Flu Vaccine QUAD High Dose(Fluad)  Mixed hyperlipidemia -     simvastatin (ZOCOR) 20 MG tablet; Take 1 tablet (20 mg total) by mouth 2 (two) times a week.  Type 2 diabetes mellitus without complication, without long-term current use of insulin (HCC)    Problem List Items Addressed This Visit      Cardiovascular and Mediastinum   HTN (hypertension) - Primary    BP at goal with amlodipine 10mg  BP Readings from Last 3 Encounters:  11/11/19 128/82  10/04/19 139/84  09/30/19 (!) 148/108   Maintain current medication f/up in 21months      Relevant Medications   amLODipine (NORVASC) 10 MG tablet   simvastatin (ZOCOR) 20 MG tablet (Start on 11/14/2019)     Endocrine   Type 2 diabetes mellitus without complication, without long-term current use of insulin (HCC)    Controlled with diet HgbA1c of 6.3 Up to date with eye exam: no DM retinopathy. No peripheral neuropathy. F/up in 48months      Relevant Medications   simvastatin (ZOCOR) 20 MG tablet (Start on 11/14/2019)     Other   Hyperlipidemia    ASCVD risk in 61yrs: 17.5% LDL at 128 at this time. No myalgia with crestor 2x/week. Prediabetic with HgbA1c at 6.3 No known CAD/artherosclerosis/PAD. BP at goal  Continue current medication Repeat CK total and lipid panel in 74months.       Relevant Medications   amLODipine (NORVASC) 10 MG tablet   simvastatin (ZOCOR) 20 MG tablet (Start on 11/14/2019)    Other Visit Diagnoses    Influenza vaccine needed        Relevant Orders   Flu Vaccine QUAD High Dose(Fluad) (Completed)      Follow-up: Return in about 6 months (around 05/10/2020) for DM and HTN, hyperlipidemia (fasting).  05/12/2020, NP

## 2019-11-11 NOTE — Assessment & Plan Note (Signed)
Controlled with diet HgbA1c of 6.3 Up to date with eye exam: no DM retinopathy. No peripheral neuropathy. F/up in 50months

## 2019-11-11 NOTE — Patient Instructions (Signed)
Changed amlodipine dose to 10mg  daily  You received Zostavax in 2015. You can still get Shingrix vaccine if you want it.

## 2019-11-11 NOTE — Assessment & Plan Note (Signed)
BP at goal with amlodipine 10mg  BP Readings from Last 3 Encounters:  11/11/19 128/82  10/04/19 139/84  09/30/19 (!) 148/108   Maintain current medication f/up in 75months

## 2019-11-14 ENCOUNTER — Telehealth: Payer: Self-pay

## 2019-11-14 ENCOUNTER — Other Ambulatory Visit: Payer: Self-pay | Admitting: Physician Assistant

## 2019-11-14 ENCOUNTER — Encounter: Payer: Self-pay | Admitting: Nurse Practitioner

## 2019-11-14 MED ORDER — HYDROCODONE-ACETAMINOPHEN 5-325 MG PO TABS: 1.0000 | ORAL_TABLET | Freq: Two times a day (BID) | ORAL | 0 refills | Status: DC | PRN

## 2019-11-14 NOTE — Telephone Encounter (Signed)
Patient called he needs prescription refilled for hydrocodone. Call back:[phone]

## 2019-11-14 NOTE — Progress Notes (Signed)
Abstracted result and sent to scan  

## 2019-11-14 NOTE — Telephone Encounter (Signed)
Sent in

## 2019-11-17 ENCOUNTER — Ambulatory Visit (INDEPENDENT_AMBULATORY_CARE_PROVIDER_SITE_OTHER): Payer: Managed Care, Other (non HMO) | Admitting: Orthopaedic Surgery

## 2019-11-17 ENCOUNTER — Encounter: Payer: Self-pay | Admitting: Orthopaedic Surgery

## 2019-11-17 ENCOUNTER — Ambulatory Visit: Payer: Self-pay

## 2019-11-17 DIAGNOSIS — Z96641 Presence of right artificial hip joint: Secondary | ICD-10-CM

## 2019-11-17 MED ORDER — HYDROCODONE-ACETAMINOPHEN 5-325 MG PO TABS: 1.0000 | ORAL_TABLET | Freq: Every day | ORAL | 0 refills | Status: DC | PRN

## 2019-11-17 MED ORDER — AMOXICILLIN 500 MG PO CAPS: 2000.0000 mg | ORAL_CAPSULE | Freq: Once | ORAL | 6 refills | Status: AC

## 2019-11-17 NOTE — Progress Notes (Signed)
Post-Op Visit Note   Patient: Marvin [name]           Date of Birth: January 01, 1951           MRN: [id number] Visit Date: 11/17/2019 PCP: Anne Ng, NP   Assessment & Plan:  Chief Complaint:  Chief Complaint  Patient presents with  . Right Hip - Pain   Visit Diagnoses:  1. Status post total hip replacement, right     Plan: Marvin [name] is 6 weeks status post right total hip replacement.  Overall doing well has recovered quite well.  He endorses some numbness around the incision which I told him was normal.  He is eager to go back to the gym.  Surgical scar is fully healed.  I think it is fine for him to do water aerobics at this point.  He will do some light strengthening exercises as well.  Small supply of hydrocodone refilled today.  He may go back to his baseline aspirin dose prior to surgery.  Prescription for amoxicillin was provided for upcoming dentist appointment.  Recheck in 6 weeks.  Questions encouraged and answered.  Follow-Up Instructions: Return in about 6 weeks (around 12/29/2019).   Orders:  Orders Placed This Encounter  Procedures  . XR HIP UNILAT W OR W/O PELVIS 2-3 VIEWS RIGHT   Meds ordered this encounter  Medications  . HYDROcodone-acetaminophen (NORCO) 5-325 MG tablet    Sig: Take 1 tablet by mouth daily as needed.    Dispense:  10 tablet    Refill:  0  . amoxicillin (AMOXIL) 500 MG capsule    Sig: Take 4 capsules (2,000 mg total) by mouth once for 1 dose.    Dispense:  4 capsule    Refill:  6    Imaging: XR HIP UNILAT W OR W/O PELVIS 2-3 VIEWS RIGHT  Result Date: 11/17/2019 Stable right total hip replacement without complication.   PMFS History: Patient Active Problem List   Diagnosis Date Noted  . Hyperglycemia 11/11/2019  . Primary osteoarthritis of right hip 10/03/2019  . 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  . Chronic bilateral low back pain without sciatica 02/21/2019  . Type 2 diabetes mellitus without  complication, without long-term current use of insulin (HCC) 08/25/2017  . Gastroesophageal reflux disease 02/23/2017  . Gout 05/20/2016  . HTN (hypertension) 09/11/2015  . Hyperlipidemia 09/11/2015   Past Medical History:  Diagnosis Date  . Alcohol abuse   . Allergy   . Arthritis   . Drug abuse (HCC)   . Gout   . Hernia   . Hypercholesteremia   . Hypertension   . Wears dentures    bottom front    Family History  Problem Relation Age of Onset  . Healthy Mother   . Healthy Father   . Cancer Brother        bone cancer  . Cancer Maternal Aunt        breast cancer  . Cancer Maternal Uncle        lukemia  . Cancer Cousin        unknown  . Cancer Maternal Uncle        lukemia  . Cancer Paternal Uncle        prostate  . Hearing loss Maternal Grandfather 76    Past Surgical History:  Procedure Laterality Date  . HERNIA REPAIR     twice  . REPLACEMENT TOTAL KNEE     partial  . SHOULDER SURGERY  right  . TOTAL HIP ARTHROPLASTY Right 10/03/2019   Procedure: RIGHT TOTAL HIP ARTHROPLASTY ANTERIOR APPROACH;  Surgeon: Tarry Kos, MD;  Location: [facility];  Service: Orthopedics;  Laterality: Right;   Social History   Occupational History  . Not on file  Tobacco Use  . Smoking status: Former Smoker    Types: Cigarettes    Quit date: 03/20/1998    Years since quitting: 21.6  . Smokeless tobacco: Former Neurosurgeon    Types: Engineer, drilling  . Vaping Use: Never used  Substance and Sexual Activity  . Alcohol use: No  . Drug use: No  . Sexual activity: Yes    Birth control/protection: Condom

## 2019-11-30 ENCOUNTER — Telehealth: Payer: Self-pay

## 2019-11-30 NOTE — Telephone Encounter (Signed)
Patient called in because he said current prescription is not working for him said the 10 is running out , wanted to get a refill asap and also has questions .

## 2019-12-01 ENCOUNTER — Other Ambulatory Visit: Payer: Self-pay | Admitting: Physician Assistant

## 2019-12-01 MED ORDER — HYDROCODONE-ACETAMINOPHEN 5-325 MG PO TABS
1.0000 | ORAL_TABLET | Freq: Every day | ORAL | 0 refills | Status: DC | PRN
Start: 2019-12-01 — End: 2019-12-08

## 2019-12-01 NOTE — Telephone Encounter (Signed)
Sent in one more rx.  Can take ibuprofen 800mg  tid to help.  Needs to wean from this norco rx

## 2019-12-07 ENCOUNTER — Telehealth: Payer: Self-pay

## 2019-12-07 NOTE — Telephone Encounter (Signed)
Patient called he is requesting prescription refill for hydrocodone call back:[phone]

## 2019-12-08 ENCOUNTER — Other Ambulatory Visit: Payer: Self-pay | Admitting: Physician Assistant

## 2019-12-08 MED ORDER — HYDROCODONE-ACETAMINOPHEN 5-325 MG PO TABS
1.0000 | ORAL_TABLET | Freq: Every day | ORAL | 0 refills | Status: DC | PRN
Start: 2019-12-08 — End: 2019-12-15

## 2019-12-08 NOTE — Telephone Encounter (Signed)
Sent in

## 2019-12-09 ENCOUNTER — Other Ambulatory Visit: Payer: Self-pay | Admitting: Nurse Practitioner

## 2019-12-09 DIAGNOSIS — J301 Allergic rhinitis due to pollen: Secondary | ICD-10-CM

## 2019-12-09 NOTE — Telephone Encounter (Signed)
Last OV 11/11/19 Last fill 08/10/19  #16g/1

## 2019-12-14 ENCOUNTER — Telehealth: Payer: Self-pay | Admitting: Orthopaedic Surgery

## 2019-12-14 NOTE — Telephone Encounter (Signed)
Patient called requesting a refill of hydrocodone. Please send to pharmacy on file. Patient phone number is [phone].

## 2019-12-15 ENCOUNTER — Other Ambulatory Visit: Payer: Self-pay | Admitting: Physician Assistant

## 2019-12-15 MED ORDER — HYDROCODONE-ACETAMINOPHEN 5-325 MG PO TABS
1.0000 | ORAL_TABLET | Freq: Every day | ORAL | 0 refills | Status: DC | PRN
Start: 2019-12-15 — End: 2020-03-15

## 2019-12-15 NOTE — Telephone Encounter (Signed)
I sent in, but this is last narcotic rx we can send in.  Needs to wean off

## 2019-12-19 ENCOUNTER — Telehealth: Payer: Self-pay | Admitting: Nurse Practitioner

## 2019-12-19 NOTE — Telephone Encounter (Signed)
Patient is calling and stated that pharmacy needed a new prescription for amlodipine sent to [facility] on [address], please advise. CB is [phone]

## 2019-12-20 ENCOUNTER — Encounter: Payer: Self-pay | Admitting: Nurse Practitioner

## 2019-12-20 NOTE — Telephone Encounter (Signed)
Patient is calling back regarding previous message left, please advise. CB is [phone].

## 2019-12-22 ENCOUNTER — Other Ambulatory Visit: Payer: Self-pay

## 2019-12-22 ENCOUNTER — Telehealth: Payer: Self-pay | Admitting: Orthopaedic Surgery

## 2019-12-22 DIAGNOSIS — I10 Essential (primary) hypertension: Secondary | ICD-10-CM

## 2019-12-22 MED ORDER — AMLODIPINE BESYLATE 10 MG PO TABS: 10.0000 mg | ORAL_TABLET | Freq: Every day | ORAL | 3 refills | Status: DC

## 2019-12-22 NOTE — Telephone Encounter (Signed)
Left detailed message on patient voicemail informing him that our records show he last had medication filled on 11/11/19 with a 90 day supply and 3 refills. Pt instructed to call our office back with any other questions or concerns.

## 2019-12-22 NOTE — Telephone Encounter (Signed)
Spoke with pt and informed him I spoke with the pharmacy to clear up the misunderstanding and his Rx would be ready to pick up today or tomorrow.

## 2019-12-22 NOTE — Telephone Encounter (Signed)
Pt returning nurse call.  Please advise.  CB [phone]

## 2019-12-22 NOTE — Telephone Encounter (Signed)
Patient called requesting a refill of hydrocodone. Please send to pharmacy on file. Patient phone number is [phone].

## 2019-12-23 ENCOUNTER — Other Ambulatory Visit: Payer: Self-pay

## 2019-12-23 MED ORDER — TRAMADOL HCL 50 MG PO TABS: 50.0000 mg | ORAL_TABLET | Freq: Four times a day (QID) | ORAL | 0 refills | Status: DC | PRN

## 2019-12-23 NOTE — Telephone Encounter (Signed)
Called pt to advise of message below there is no documented history or history reported by patient of seizures. Rx in module and sent to pharm.

## 2019-12-23 NOTE — Telephone Encounter (Signed)
No more narcotics per last message.  Can send in tramadol 50mg  one every 6 hours prn pain #30 no refills if no hx of seizures

## 2019-12-29 ENCOUNTER — Encounter: Payer: Self-pay | Admitting: Orthopaedic Surgery

## 2019-12-29 ENCOUNTER — Ambulatory Visit (INDEPENDENT_AMBULATORY_CARE_PROVIDER_SITE_OTHER): Payer: Managed Care, Other (non HMO) | Admitting: Physician Assistant

## 2019-12-29 DIAGNOSIS — Z96641 Presence of right artificial hip joint: Secondary | ICD-10-CM

## 2019-12-29 NOTE — Progress Notes (Signed)
Post-Op Visit Note   Patient: Marvin [name]           Date of Birth: Jan 14, 1951           MRN: [id number] Visit Date: 12/29/2019 PCP: Anne Ng, NP   Assessment & Plan:  Chief Complaint:  Chief Complaint  Patient presents with  . Right Hip - Routine Post Op   Visit Diagnoses:  1. History of total hip replacement, right     Plan: Patient is a very pleasant 69 year old gentleman who comes in today 3 months out right total hip replacement 10/03/2019.  He is doing well.  He still notes 1 of pain at times.  He has been working out trying to regain strength.  Examination of his right hip reveals full hip flexion.  Near full strength.  He is neurovascular intact distally.  At this point, he will continue to advance with activity as tolerated.  We will provide him with a new work note of no lifting greater than 10 pounds and no standing for longer than 3 hours at a time for the next 3 months as he works at [employer] and has a very physical job.  He will follow-up with [place] in 3 months time for repeat evaluation AP pelvis x-rays.  Call with concerns or questions.  Follow-Up Instructions: Return in about 3 months (around 03/30/2020).   Orders:  No orders of the defined types were placed in this encounter.  No orders of the defined types were placed in this encounter.   Imaging: No new imaging  PMFS History: Patient Active Problem List   Diagnosis Date Noted  . Hyperglycemia 11/11/2019  . Primary osteoarthritis of right hip 10/03/2019  . 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  . Chronic bilateral low back pain without sciatica 02/21/2019  . Type 2 diabetes mellitus without complication, without long-term current use of insulin (HCC) 08/25/2017  . Gastroesophageal reflux disease 02/23/2017  . Gout 05/20/2016  . HTN (hypertension) 09/11/2015  . Hyperlipidemia 09/11/2015   Past Medical History:  Diagnosis Date  . Alcohol abuse   . Allergy   . Arthritis   .  Drug abuse (HCC)   . Gout   . Hernia   . Hypercholesteremia   . Hypertension   . Wears dentures    bottom front    Family History  Problem Relation Age of Onset  . Healthy Mother   . Healthy Father   . Cancer Brother        bone cancer  . Cancer Maternal Aunt        breast cancer  . Cancer Maternal Uncle        lukemia  . Cancer Cousin        unknown  . Cancer Maternal Uncle        lukemia  . Cancer Paternal Uncle        prostate  . Hearing loss Maternal Grandfather 53    Past Surgical History:  Procedure Laterality Date  . HERNIA REPAIR     twice  . REPLACEMENT TOTAL KNEE     partial  . SHOULDER SURGERY     right  . TOTAL HIP ARTHROPLASTY Right 10/03/2019   Procedure: RIGHT TOTAL HIP ARTHROPLASTY ANTERIOR APPROACH;  Surgeon: Tarry Kos, MD;  Location: [facility];  Service: Orthopedics;  Laterality: Right;   Social History   Occupational History  . Not on file  Tobacco Use  . Smoking status: Former Smoker  Types: Cigarettes    Quit date: 03/20/1998    Years since quitting: 21.7  . Smokeless tobacco: Former Neurosurgeon    Types: Engineer, drilling  . Vaping Use: Never used  Substance and Sexual Activity  . Alcohol use: No  . Drug use: No  . Sexual activity: Yes    Birth control/protection: Condom

## 2020-01-02 ENCOUNTER — Telehealth: Payer: Self-pay | Admitting: Orthopaedic Surgery

## 2020-01-02 ENCOUNTER — Other Ambulatory Visit: Payer: Self-pay | Admitting: Physician Assistant

## 2020-01-02 MED ORDER — TRAMADOL HCL 50 MG PO TABS
50.0000 mg | ORAL_TABLET | Freq: Four times a day (QID) | ORAL | 0 refills | Status: DC | PRN
Start: 2020-01-02 — End: 2020-01-11

## 2020-01-02 MED ORDER — TRAMADOL HCL 50 MG PO TABS
50.0000 mg | ORAL_TABLET | Freq: Four times a day (QID) | ORAL | 0 refills | Status: DC | PRN
Start: 2020-01-02 — End: 2020-01-02

## 2020-01-02 NOTE — Telephone Encounter (Signed)
See message below. If yes, please send into pharm. Thanks.  

## 2020-01-02 NOTE — Telephone Encounter (Signed)
Patient called. He would like a refill on Tramadol sent to [facility] on [address]. His call back number is [phone]

## 2020-01-02 NOTE — Telephone Encounter (Signed)
Sent in

## 2020-01-11 ENCOUNTER — Telehealth: Payer: Self-pay | Admitting: Orthopaedic Surgery

## 2020-01-11 ENCOUNTER — Other Ambulatory Visit: Payer: Self-pay | Admitting: Physician Assistant

## 2020-01-11 MED ORDER — TRAMADOL HCL 50 MG PO TABS
50.0000 mg | ORAL_TABLET | Freq: Four times a day (QID) | ORAL | 0 refills | Status: DC | PRN
Start: 2020-01-11 — End: 2020-01-26

## 2020-01-11 NOTE — Telephone Encounter (Signed)
Sent in

## 2020-01-11 NOTE — Telephone Encounter (Signed)
I called patient and advised. 

## 2020-01-11 NOTE — Telephone Encounter (Signed)
Patient called requesting a refill of tramadol. Please send to pharmacy on file. Patient phone number is [phone].

## 2020-01-11 NOTE — Telephone Encounter (Signed)
Please advise 

## 2020-01-26 ENCOUNTER — Telehealth: Payer: Self-pay | Admitting: Orthopaedic Surgery

## 2020-01-26 MED ORDER — TRAMADOL HCL 50 MG PO TABS: 50.0000 mg | ORAL_TABLET | Freq: Two times a day (BID) | ORAL | 0 refills | Status: DC | PRN

## 2020-01-26 NOTE — Telephone Encounter (Signed)
Patient called needing Rx refilled Tramadol   The number to contact patient is [phone]

## 2020-01-26 NOTE — Telephone Encounter (Signed)
Please send into pharm  

## 2020-01-26 NOTE — Telephone Encounter (Signed)
done

## 2020-02-04 ENCOUNTER — Other Ambulatory Visit: Payer: Self-pay | Admitting: Nurse Practitioner

## 2020-02-04 DIAGNOSIS — K219 Gastro-esophageal reflux disease without esophagitis: Secondary | ICD-10-CM

## 2020-02-04 DIAGNOSIS — E790 Hyperuricemia without signs of inflammatory arthritis and tophaceous disease: Secondary | ICD-10-CM

## 2020-02-07 NOTE — Telephone Encounter (Signed)
Last Ov 11/11/19 Last fill 08/08/19 #180/1

## 2020-03-06 ENCOUNTER — Ambulatory Visit (INDEPENDENT_AMBULATORY_CARE_PROVIDER_SITE_OTHER): Payer: Medicare HMO | Admitting: Orthopaedic Surgery

## 2020-03-06 ENCOUNTER — Ambulatory Visit (INDEPENDENT_AMBULATORY_CARE_PROVIDER_SITE_OTHER): Payer: Medicare HMO

## 2020-03-06 DIAGNOSIS — Z96641 Presence of right artificial hip joint: Secondary | ICD-10-CM

## 2020-03-06 NOTE — Progress Notes (Signed)
Office Visit Note   Patient: Marvin [name]           Date of Birth: [birth year]           MRN: [id number] Visit Date: 03/06/2020              Requested by: Marvin Ng, NP [name] [place],  [place] [address] PCP: Marvin Ng, NP   Assessment & Plan: Visit Diagnoses:  1. History of total hip replacement, right     Plan: Impression is 54-month status post right total hip replacement.  He is very happy overall with his recovery and outcome.  He would like to be released back to work at this point working for [employer].  Recheck in 6 months with standing AP pelvis x-rays.  Dental prophylaxis reinforced.  Follow-Up Instructions: Return in about 6 months (around 09/03/2020).   Orders:  Orders Placed This Encounter  Procedures  . XR Pelvis 1-2 Views   No orders of the defined types were placed in this encounter.     Procedures: No procedures performed   Clinical Data: No additional findings.   Subjective: Chief Complaint  Patient presents with  . Right Hip - Follow-up    [name] is 73-month status post right total hip replacement.  He is doing well reports no problems.  Exercising 3 times a week.  He would like to be released back to work without restrictions.   Review of Systems   Objective: Vital Signs: There were no vitals taken for this visit.  Physical Exam  Ortho Exam Right hip shows a fully healed surgical scar.  Excellent range of motion without pain.  Walking well with a normal gait. Specialty Comments:  No specialty comments available.  Imaging: XR Pelvis 1-2 Views  Result Date: 03/06/2020 Stable total hip replacement without complications.  Moderate left hip arthritis and joint space narrowing    PMFS History: Patient Active Problem List   Diagnosis Date Noted  . Hyperglycemia 11/11/2019  . Primary osteoarthritis of right hip 10/03/2019  . 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  . Chronic  bilateral low back pain without sciatica 02/21/2019  . Type 2 diabetes mellitus without complication, without long-term current use of insulin (HCC) 08/25/2017  . Gastroesophageal reflux disease 02/23/2017  . Gout 05/20/2016  . HTN (hypertension) 09/11/2015  . Hyperlipidemia 09/11/2015   Past Medical History:  Diagnosis Date  . Alcohol abuse   . Allergy   . Arthritis   . Drug abuse (HCC)   . Gout   . Hernia   . Hypercholesteremia   . Hypertension   . Wears dentures    bottom front    Family History  Problem Relation Age of Onset  . Healthy Mother   . Healthy Father   . Cancer Brother        bone cancer  . Cancer Maternal Aunt        breast cancer  . Cancer Maternal Uncle        lukemia  . Cancer Cousin        unknown  . Cancer Maternal Uncle        lukemia  . Cancer Paternal Uncle        prostate  . Hearing loss Maternal Grandfather 27    Past Surgical History:  Procedure Laterality Date  . HERNIA REPAIR     twice  . REPLACEMENT TOTAL KNEE     partial  . SHOULDER SURGERY  right  . TOTAL HIP ARTHROPLASTY Right 10/03/2019   Procedure: RIGHT TOTAL HIP ARTHROPLASTY ANTERIOR APPROACH;  Surgeon: Marvin Kos, MD;  Location: [facility];  Service: Orthopedics;  Laterality: Right;   Social History   Occupational History  . Not on file  Tobacco Use  . Smoking status: Former Smoker    Types: Cigarettes    Quit date: 03/20/1998    Years since quitting: 21.9  . Smokeless tobacco: Former Neurosurgeon    Types: Engineer, drilling  . Vaping Use: Never used  Substance and Sexual Activity  . Alcohol use: No  . Drug use: No  . Sexual activity: Yes    Birth control/protection: Condom

## 2020-03-14 ENCOUNTER — Other Ambulatory Visit: Payer: Self-pay

## 2020-03-14 ENCOUNTER — Ambulatory Visit
Admission: EM | Admit: 2020-03-14 | Discharge: 2020-03-14 | Disposition: A | Discharge status: Home / Self Care | Payer: 59

## 2020-03-14 DIAGNOSIS — M109 Gout, unspecified: Secondary | ICD-10-CM | POA: Diagnosis not present

## 2020-03-14 HISTORY — DX: Obesity, unspecified: E66.9

## 2020-03-14 MED ORDER — METHYLPREDNISOLONE ACETATE 80 MG/ML IJ SUSP
80.0000 mg | Freq: Once | INTRAMUSCULAR | Status: AC
  Administered 2020-03-14: 80 mg via INTRAMUSCULAR

## 2020-03-14 NOTE — ED Provider Notes (Signed)
[facility]   MRN: [id number] DOB: 1950-04-09  Subjective:   Marvin [name] is a 70 y.o. male presenting for 3 day history of acute onset left ankle pain that is severe. Has a history of gout, has it in this area. Denies history of arthritis. Was taking allopurinol but stopped it since it makes his gout worse. No history of diabetes, does have prediabetes. No falls, trauma, open wounds. No history of heart conditions, CKD.   No current facility-administered medications for this encounter.  Current Outpatient Medications:  .  allopurinol (ZYLOPRIM) 100 MG tablet, TAKE ONE TABLET BY MOUTH TWICE A DAY NEEDS TO SCHEDULE FOLLOW UP APPOINTMENT, Disp: 180 tablet, Rfl: 1 .  amLODipine (NORVASC) 10 MG tablet, Take 1 tablet (10 mg total) by mouth daily., Disp: 90 tablet, Rfl: 3 .  amoxicillin (AMOXIL) 500 MG capsule, Take 1,000 mg by mouth 2 (two) times daily., Disp: , Rfl:  .  aspirin EC 81 MG tablet, Take 1 tablet (81 mg total) by mouth 2 (two) times daily., Disp: 84 tablet, Rfl: 11 .  fluticasone (FLONASE) 50 MCG/ACT nasal spray, INSTILL TWO SPRAYS IN EACH NOSTRIL ONCE DAILY AS DIRECTED, Disp: 16 mL, Rfl: 1 .  HYDROcodone-acetaminophen (NORCO) 5-325 MG tablet, Take 1 tablet by mouth daily as needed., Disp: 10 tablet, Rfl: 0 .  ondansetron (ZOFRAN) 4 MG tablet, Take 1 tablet (4 mg total) by mouth every 8 (eight) hours as needed for nausea or vomiting., Disp: 40 tablet, Rfl: 0 .  pantoprazole (PROTONIX) 20 MG tablet, Take 20 mg by mouth daily., Disp: , Rfl:  .  simvastatin (ZOCOR) 20 MG tablet, Take 1 tablet (20 mg total) by mouth 2 (two) times a week., Disp: 12 tablet, Rfl: 3 .  traMADol (ULTRAM) 50 MG tablet, Take 1 tablet (50 mg total) by mouth every 12 (twelve) hours as needed., Disp: 20 tablet, Rfl: 0   Allergies  Allergen Reactions  . Buprenorphine Hcl Itching  . Morphine And Related Itching    Past Medical History:  Diagnosis Date  . Alcohol abuse   . Allergy   .  Arthritis   . Drug abuse (HCC)   . Gout   . Hernia   . Hypercholesteremia   . Hypertension   . Obesity   . Wears dentures    bottom front     Past Surgical History:  Procedure Laterality Date  . HERNIA REPAIR     twice  . REPLACEMENT TOTAL KNEE     partial  . SHOULDER SURGERY     right  . TOTAL HIP ARTHROPLASTY Right 10/03/2019   Procedure: RIGHT TOTAL HIP ARTHROPLASTY ANTERIOR APPROACH;  Surgeon: Tarry Kos, MD;  Location: [facility];  Service: Orthopedics;  Laterality: Right;    Family History  Problem Relation Age of Onset  . Healthy Mother   . Healthy Father   . Cancer Brother        bone cancer  . Cancer Maternal Aunt        breast cancer  . Cancer Maternal Uncle        lukemia  . Cancer Cousin        unknown  . Cancer Maternal Uncle        lukemia  . Cancer Paternal Uncle        prostate  . Hearing loss Maternal Grandfather 35    Social History   Tobacco Use  . Smoking status: Former Smoker    Types: Cigarettes  Quit date: 03/20/1998    Years since quitting: 22.0  . Smokeless tobacco: Former Neurosurgeon    Types: Engineer, drilling  . Vaping Use: Never used  Substance Use Topics  . Alcohol use: Not Currently  . Drug use: No    ROS   Objective:   Vitals: BP 138/90 (BP Location: Left Arm)   Pulse 70   Temp 97.8 F (36.6 C) (Oral)   Resp 18   SpO2 96%   Physical Exam Constitutional:      General: He is not in acute distress.    Appearance: Normal appearance. He is well-developed and normal weight. He is not ill-appearing, toxic-appearing or diaphoretic.  HENT:     Head: Normocephalic and atraumatic.     Right Ear: External ear normal.     Left Ear: External ear normal.     Nose: Nose normal.     Mouth/Throat:     Pharynx: Oropharynx is clear.  Eyes:     General: No scleral icterus.       Right eye: No discharge.        Left eye: No discharge.     Extraocular Movements: Extraocular movements intact.     Pupils: Pupils are equal, round, and  reactive to light.  Cardiovascular:     Rate and Rhythm: Normal rate.  Pulmonary:     Effort: Pulmonary effort is normal.  Musculoskeletal:     Cervical back: Normal range of motion.     Left ankle: No swelling, deformity, ecchymosis or lacerations. Tenderness present. Decreased range of motion.     Left Achilles Tendon: No tenderness or defects. Thompson's test negative.     Left foot: Normal range of motion and normal capillary refill. No swelling, deformity, laceration, tenderness, bony tenderness or crepitus.  Neurological:     Mental Status: He is alert and oriented to person, place, and time.  Psychiatric:        Mood and Affect: Mood normal.        Behavior: Behavior normal.        Thought Content: Thought content normal.        Judgment: Judgment normal.      Assessment and Plan :   PDMP not reviewed this encounter.  1. Acute gout of left foot, unspecified cause     Patient requested aggressive management as he cannot miss work. Will use IM Depomedrol. Avoid gout causing foods. In the absence of trauma, will defer imaging. Counseled patient on potential for adverse effects with medications prescribed/recommended today, ER and return-to-clinic precautions discussed, patient verbalized understanding.    Wallis Bamberg, PA-C 03/14/20 1132

## 2020-03-14 NOTE — ED Triage Notes (Signed)
Pt is here with left ankle pain that he states is his Gout flaring up it started Monday, pt has taken his Zyloprim to relieve discomfort.

## 2020-03-15 ENCOUNTER — Other Ambulatory Visit: Payer: Self-pay | Admitting: Nurse Practitioner

## 2020-03-15 ENCOUNTER — Encounter: Payer: Self-pay | Admitting: Nurse Practitioner

## 2020-03-15 ENCOUNTER — Ambulatory Visit (INDEPENDENT_AMBULATORY_CARE_PROVIDER_SITE_OTHER): Payer: Medicare HMO | Admitting: Nurse Practitioner

## 2020-03-15 VITALS — BP 134/80 | HR 86 | Temp 97.7°F | Ht 71.0 in | Wt 246.8 lb

## 2020-03-15 DIAGNOSIS — M10072 Idiopathic gout, left ankle and foot: Secondary | ICD-10-CM

## 2020-03-15 DIAGNOSIS — Z8719 Personal history of other diseases of the digestive system: Secondary | ICD-10-CM | POA: Insufficient documentation

## 2020-03-15 DIAGNOSIS — K5730 Diverticulosis of large intestine without perforation or abscess without bleeding: Secondary | ICD-10-CM | POA: Insufficient documentation

## 2020-03-15 DIAGNOSIS — K5900 Constipation, unspecified: Secondary | ICD-10-CM

## 2020-03-15 DIAGNOSIS — K439 Ventral hernia without obstruction or gangrene: Secondary | ICD-10-CM | POA: Insufficient documentation

## 2020-03-15 DIAGNOSIS — Z9889 Other specified postprocedural states: Secondary | ICD-10-CM | POA: Insufficient documentation

## 2020-03-15 HISTORY — DX: Constipation, unspecified: K59.00

## 2020-03-15 MED ORDER — PREDNISONE 20 MG PO TABS: ORAL_TABLET | ORAL | 0 refills | Status: AC

## 2020-03-15 MED ORDER — COLCHICINE 0.6 MG PO TABS: ORAL_TABLET | ORAL | 0 refills | Status: DC

## 2020-03-15 MED ORDER — TRAMADOL HCL 50 MG PO TABS: 50.0000 mg | ORAL_TABLET | Freq: Two times a day (BID) | ORAL | 0 refills | Status: DC | PRN

## 2020-03-15 NOTE — Assessment & Plan Note (Signed)
Acute gout exacerbation in  Left ankle due diet indiscretion. Some improvement with depomedrol IM. Denies any foot or ankle injury.  Take oral prednisone and tramadol as prescribed Use colchicine only if no improvement with oral prednisone and tramadol in 3days. Hold simvastatin if you have to take colchicine. Continue cold compress and elevation.

## 2020-03-15 NOTE — Patient Instructions (Signed)
Use colchicine only if no improvement with oral prednisone and tramadol in 3days. Hold simvastatin if you have to take colchicine. Continue cold compress and elevation.

## 2020-03-15 NOTE — Progress Notes (Signed)
Subjective:  Patient ID: Marvin [name], male    DOB: 1950-09-23  Age: 70 y.o. MRN: [id number]  CC: Acute Visit (Pt c/o gout flare up, left ankle x 4 days. Pt went to urgent care yesterday 03/14/20 and was given a shot but it did not help. )  HPI  Gout Acute gout exacerbation in  Left ankle due diet indiscretion. Some improvement with depomedrol IM. Denies any foot or ankle injury.  Take oral prednisone and tramadol as prescribed Use colchicine only if no improvement with oral prednisone and tramadol in 3days. Hold simvastatin if you have to take colchicine. Continue cold compress and elevation.  Reviewed past Medical, Social and Family history today.  Outpatient Medications Prior to Visit  Medication Sig Dispense Refill  . allopurinol (ZYLOPRIM) 100 MG tablet TAKE ONE TABLET BY MOUTH TWICE A DAY NEEDS TO SCHEDULE FOLLOW UP APPOINTMENT 180 tablet 1  . amLODipine (NORVASC) 10 MG tablet Take 1 tablet (10 mg total) by mouth daily. 90 tablet 3  . aspirin EC 81 MG tablet Take 1 tablet (81 mg total) by mouth 2 (two) times daily. 84 tablet 11  . fluticasone (FLONASE) 50 MCG/ACT nasal spray INSTILL TWO SPRAYS IN EACH NOSTRIL ONCE DAILY AS DIRECTED 16 mL 1  . simvastatin (ZOCOR) 20 MG tablet Take 1 tablet (20 mg total) by mouth 2 (two) times a week. 12 tablet 3  . amoxicillin (AMOXIL) 500 MG capsule Take 1,000 mg by mouth 2 (two) times daily.    Marland Kitchen HYDROcodone-acetaminophen (NORCO) 5-325 MG tablet Take 1 tablet by mouth daily as needed. (Patient not taking: Reported on 03/15/2020) 10 tablet 0  . ondansetron (ZOFRAN) 4 MG tablet Take 1 tablet (4 mg total) by mouth every 8 (eight) hours as needed for nausea or vomiting. (Patient not taking: Reported on 03/15/2020) 40 tablet 0  . pantoprazole (PROTONIX) 20 MG tablet Take 20 mg by mouth daily. (Patient not taking: Reported on 03/15/2020)    . traMADol (ULTRAM) 50 MG tablet Take 1 tablet (50 mg total) by mouth every 12 (twelve) hours as needed.  (Patient not taking: Reported on 03/15/2020) 20 tablet 0   No facility-administered medications prior to visit.    ROS See HPI  Objective:  BP 134/80 (BP Location: Left Arm, Patient Position: Sitting, Cuff Size: Large)   Pulse 86   Temp 97.7 F (36.5 C) (Temporal)   Ht 5\' 11"  (1.803 m)   Wt 246 lb 12.8 oz (111.9 kg)   SpO2 98%   BMI 34.42 kg/m   Physical Exam Musculoskeletal:        General: Swelling and tenderness present. No signs of injury.     Left lower leg: Swelling and tenderness present. No bony tenderness. Edema present.     Right ankle:     Right Achilles Tendon: Normal.     Left ankle: Tenderness present over the lateral malleolus and posterior TF ligament. No medial malleolus, ATF ligament, AITF ligament, CF ligament, base of 5th metatarsal or proximal fibula tenderness. Decreased range of motion. Anterior drawer test negative. Normal pulse.     Left Achilles Tendon: Normal.  Skin:    Findings: No erythema.  Neurological:     Mental Status: He is alert and oriented to person, place, and time.    Assessment & Plan:  This visit occurred during the SARS-CoV-2 public health emergency.  Safety protocols were in place, including screening questions prior to the visit, additional usage of staff PPE, and extensive cleaning of  exam room while observing appropriate contact time as indicated for disinfecting solutions.   Bryndon was seen today for acute visit.  Diagnoses and all orders for this visit:  Acute idiopathic gout of left ankle -     traMADol (ULTRAM) 50 MG tablet; Take 1 tablet (50 mg total) by mouth every 12 (twelve) hours as needed. -     predniSONE (DELTASONE) 20 MG tablet; Take 2 tablets (40 mg total) by mouth daily with breakfast for 1 day, THEN 1 tablet (20 mg total) daily with breakfast for 2 days. -     colchicine 0.6 MG tablet; Take 1tab every 2hrs for acute gout. No more than 2tabs in 24hrs. Take with food   Problem List Items Addressed This Visit       Other   Gout - Primary    Acute gout exacerbation in  Left ankle due diet indiscretion. Some improvement with depomedrol IM. Denies any foot or ankle injury.  Take oral prednisone and tramadol as prescribed Use colchicine only if no improvement with oral prednisone and tramadol in 3days. Hold simvastatin if you have to take colchicine. Continue cold compress and elevation.      Relevant Medications   traMADol (ULTRAM) 50 MG tablet   predniSONE (DELTASONE) 20 MG tablet   colchicine 0.6 MG tablet      Follow-up: No follow-ups on file.  Alysia Penna, NP

## 2020-03-23 ENCOUNTER — Other Ambulatory Visit: Payer: Self-pay | Admitting: Nurse Practitioner

## 2020-03-23 DIAGNOSIS — E782 Mixed hyperlipidemia: Secondary | ICD-10-CM

## 2020-03-25 ENCOUNTER — Other Ambulatory Visit: Payer: Self-pay | Admitting: Nurse Practitioner

## 2020-03-25 DIAGNOSIS — J301 Allergic rhinitis due to pollen: Secondary | ICD-10-CM

## 2020-03-27 NOTE — Telephone Encounter (Signed)
Last seen in office on 11/11/19 Next appt 05/10/20 Chart supports Rx

## 2020-03-30 ENCOUNTER — Ambulatory Visit: Payer: Managed Care, Other (non HMO) | Admitting: Orthopaedic Surgery

## 2020-05-10 ENCOUNTER — Other Ambulatory Visit: Payer: Self-pay

## 2020-05-10 ENCOUNTER — Ambulatory Visit (INDEPENDENT_AMBULATORY_CARE_PROVIDER_SITE_OTHER): Payer: 59 | Admitting: Nurse Practitioner

## 2020-05-10 ENCOUNTER — Encounter: Payer: Self-pay | Admitting: Nurse Practitioner

## 2020-05-10 VITALS — BP 140/90 | HR 68 | Temp 97.8°F | Wt 243.8 lb

## 2020-05-10 DIAGNOSIS — E119 Type 2 diabetes mellitus without complications: Secondary | ICD-10-CM | POA: Diagnosis not present

## 2020-05-10 DIAGNOSIS — I10 Essential (primary) hypertension: Secondary | ICD-10-CM

## 2020-05-10 DIAGNOSIS — E782 Mixed hyperlipidemia: Secondary | ICD-10-CM | POA: Diagnosis not present

## 2020-05-10 DIAGNOSIS — E790 Hyperuricemia without signs of inflammatory arthritis and tophaceous disease: Secondary | ICD-10-CM | POA: Diagnosis not present

## 2020-05-10 NOTE — Assessment & Plan Note (Signed)
Normal foot exam today Up to date with dental cleaning every 89months with prophylactic oral abx due to recent right hip arthroplasty. Upcoming eye exam 07/2020. Up to date with vaccines.  Repeat HgbA1c, BMP, lipid panel and urine microalbumin

## 2020-05-10 NOTE — Assessment & Plan Note (Signed)
Elevated BP this AM due to lack of sleep. 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 Not yet had BP medications. No headache or dizziness or LE edema or chest pain or change in vision. BP Readings from Last 3 Encounters:  05/10/20 140/90  03/15/20 134/80  03/14/20 138/90   Repeat bmp Maintain amlodipine dose

## 2020-05-10 NOTE — Assessment & Plan Note (Signed)
Repeat lipid panel and CK Improved myalgia with use of zocor 2x/week. Consider adding CoQ10 and increase statin dose frequency, if LDL not at goal

## 2020-05-10 NOTE — Progress Notes (Signed)
Subjective:  Patient ID: Marvin [name], male    DOB: [birth year]  Age: 70 y.o. MRN: [id number]  CC: Follow-up (6 month follow, pt is here in regards to DM, Htn  and hyperlipidemia, pt is not fasting )  HPI  HTN (hypertension) Elevated BP this AM due to lack of sleep. 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 Not yet had BP medications. No headache or dizziness or LE edema or chest pain or change in vision. BP Readings from Last 3 Encounters:  05/10/20 140/90  03/15/20 134/80  03/14/20 138/90   Repeat bmp Maintain amlodipine dose  Type 2 diabetes mellitus without complication, without long-term current use of insulin (HCC) Normal foot exam today Up to date with dental cleaning every 94months with prophylactic oral abx due to recent right hip arthroplasty. Upcoming eye exam 07/2020. Up to date with vaccines.  Repeat HgbA1c, BMP, lipid panel and urine microalbumin  Gout Repeat uric acid and BMP. Last acute gout flare 02/2020: resolved with prednisone and dietary changes Continue allopurinol  Hyperlipidemia Repeat lipid panel and CK Improved myalgia with use of zocor 2x/week. Consider adding CoQ10 and increase statin dose frequency, if LDL not at goal  BP Readings from Last 3 Encounters:  05/10/20 140/90  03/15/20 134/80  03/14/20 138/90   Wt Readings from Last 3 Encounters:  05/10/20 243 lb 12.8 oz (110.6 kg)  03/15/20 246 lb 12.8 oz (111.9 kg)  11/11/19 250 lb (113.4 kg)   Reviewed past Medical, Social and Family history today.  Outpatient Medications Prior to Visit  Medication Sig Dispense Refill  . allopurinol (ZYLOPRIM) 100 MG tablet TAKE ONE TABLET BY MOUTH TWICE A DAY NEEDS TO SCHEDULE FOLLOW UP APPOINTMENT 180 tablet 1  . amLODipine (NORVASC) 10 MG tablet Take 1 tablet (10 mg total) by mouth daily. 90 tablet 3  . aspirin EC 81 MG tablet Take 1 tablet (81 mg total) by mouth 2 (two) times daily. 84 tablet 11  . colchicine 0.6 MG tablet Take 1tab every 2hrs for  acute gout. No more than 2tabs in 24hrs. Take with food 6 tablet 0  . fluticasone (FLONASE) 50 MCG/ACT nasal spray INSTILL TWO SPRAYS IN EACH NOSTRIL ONCE DAILY AS DIRECTED 16 mL 1  . simvastatin (ZOCOR) 20 MG tablet TAKE 1 TABLET BY MOUTH 2 TIMES A WEEK 12 tablet 3  . traMADol (ULTRAM) 50 MG tablet Take 1 tablet (50 mg total) by mouth every 12 (twelve) hours as needed. (Patient not taking: Reported on 05/10/2020) 3 tablet 0   No facility-administered medications prior to visit.    ROS See HPI  Objective:  BP 140/90 (BP Location: Left Arm, Patient Position: Sitting, Cuff Size: Large)   Pulse 68   Temp 97.8 F (36.6 C) (Oral)   Wt 243 lb 12.8 oz (110.6 kg)   SpO2 96%   BMI 34.00 kg/m   Physical Exam Vitals reviewed.  Constitutional:      General: He is not in acute distress.    Appearance: He is well-developed. He is obese.  Cardiovascular:     Rate and Rhythm: Normal rate and regular rhythm.     Pulses: Normal pulses.     Heart sounds: Normal heart sounds.  Pulmonary:     Effort: Pulmonary effort is normal. No respiratory distress.     Breath sounds: Normal breath sounds.  Chest:     Chest wall: No tenderness.  Musculoskeletal:        General: Normal range of motion.  Right lower leg: No edema.     Left lower leg: No edema.  Skin:    General: Skin is warm and dry.  Neurological:     Mental Status: He is alert and oriented to person, place, and time.    Assessment & Plan:  This visit occurred during the SARS-CoV-2 public health emergency.  Safety protocols were in place, including screening questions prior to the visit, additional usage of staff PPE, and extensive cleaning of exam room while observing appropriate contact time as indicated for disinfecting solutions.   Marvin [name] was seen today for follow-up.  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 Not yet had BP medications. No headache or dizziness or LE edema or chest pain or change in vision. BP Readings from Last 3 Encounters:  05/10/20 140/90  03/15/20 134/80  03/14/20 138/90   Repeat bmp Maintain amlodipine dose      Relevant Orders   Basic metabolic panel     Endocrine   Type 2 diabetes mellitus without complication, without long-term current use of insulin (HCC)    Normal foot exam today Up to date with dental cleaning every 25months with prophylactic oral abx due to recent right hip arthroplasty. Upcoming eye exam 07/2020. Up to date with vaccines.  Repeat HgbA1c, BMP, lipid panel and urine microalbumin      Relevant Orders   Basic metabolic panel   Hepatic function panel   Hemoglobin A1c   Microalbumin / creatinine urine ratio     Other   Gout    Repeat uric acid and BMP. Last acute gout flare 02/2020: resolved with prednisone and dietary changes Continue allopurinol      Hyperlipidemia    Repeat lipid panel and CK Improved myalgia with use of zocor 2x/week. Consider adding CoQ10 and increase statin dose frequency, if LDL not at goal      Relevant Orders   Hepatic function panel   Lipid panel      Follow-up: Return in about 6 months (around 11/10/2020) for CPE (fasting).  Alysia Penna, NP

## 2020-05-10 NOTE — Patient Instructions (Signed)
Return to lab fasting for blood draw and urine collection Maintain current medications Have eye exam report faxed to me once completed

## 2020-05-10 NOTE — Assessment & Plan Note (Addendum)
Repeat uric acid and BMP. Last acute gout flare 02/2020: resolved with prednisone and dietary changes Continue allopurinol

## 2020-05-11 ENCOUNTER — Other Ambulatory Visit (INDEPENDENT_AMBULATORY_CARE_PROVIDER_SITE_OTHER): Payer: 59

## 2020-05-11 DIAGNOSIS — E790 Hyperuricemia without signs of inflammatory arthritis and tophaceous disease: Secondary | ICD-10-CM

## 2020-05-11 DIAGNOSIS — E782 Mixed hyperlipidemia: Secondary | ICD-10-CM

## 2020-05-11 DIAGNOSIS — E119 Type 2 diabetes mellitus without complications: Secondary | ICD-10-CM

## 2020-05-11 DIAGNOSIS — I10 Essential (primary) hypertension: Secondary | ICD-10-CM

## 2020-05-11 LAB — BASIC METABOLIC PANEL
BUN: 13 mg/dL (ref 6–23)
CO2: 30 mEq/L (ref 19–32)
Calcium: 9.5 mg/dL (ref 8.4–10.5)
Chloride: 103 mEq/L (ref 96–112)
Creatinine, Ser: 1.06 mg/dL (ref 0.40–1.50)
GFR: 72.75 mL/min (ref >60.00)
Glucose, Bld: 82 mg/dL (ref 70–99)
Potassium: 4.2 mEq/L (ref 3.5–5.1)
Sodium: 141 mEq/L (ref 135–145)

## 2020-05-11 LAB — HEPATIC FUNCTION PANEL
ALT: 15 U/L (ref 0–53)
AST: 17 U/L (ref 0–37)
Albumin: 4.8 g/dL (ref 3.5–5.2)
Alkaline Phosphatase: 102 U/L (ref 39–117)
Bilirubin, Direct: 0.1 mg/dL (ref 0.0–0.3)
Total Bilirubin: 0.7 mg/dL (ref 0.2–1.2)
Total Protein: 7.6 g/dL (ref 6.0–8.3)

## 2020-05-11 LAB — LIPID PANEL
Cholesterol: 199 mg/dL (ref 0–200)
HDL: 44.8 mg/dL (ref >39.00)
LDL Cholesterol: 142 mg/dL — ABNORMAL HIGH (ref 0–99)
NonHDL: 154.19
Total CHOL/HDL Ratio: 4
Triglycerides: 59 mg/dL (ref 0.0–149.0)
VLDL: 11.8 mg/dL (ref 0.0–40.0)

## 2020-05-11 LAB — MICROALBUMIN / CREATININE URINE RATIO
Creatinine,U: 97.7 mg/dL
Microalb Creat Ratio: 0.7 mg/g (ref 0.0–30.0)
Microalb, Ur: 0.7 mg/dL (ref 0.0–1.9)

## 2020-05-11 LAB — URIC ACID: Uric Acid, Serum: 6.7 mg/dL (ref 4.0–7.8)

## 2020-05-11 LAB — HEMOGLOBIN A1C: Hgb A1c MFr Bld: 6.4 % (ref 4.6–6.5)

## 2020-05-28 ENCOUNTER — Telehealth: Payer: Self-pay | Admitting: Nurse Practitioner

## 2020-05-28 DIAGNOSIS — E6609 Other obesity due to excess calories: Secondary | ICD-10-CM

## 2020-05-28 NOTE — Telephone Encounter (Signed)
Patient is calling to get a referral for [facility] for weight management. Please call him at [phone] if you have any questions.

## 2020-05-29 NOTE — Telephone Encounter (Signed)
Please advise 

## 2020-05-30 NOTE — Addendum Note (Signed)
Addended by: Michaela Corner on: 05/30/2020 09:54 PM   Modules accepted: Orders

## 2020-07-04 ENCOUNTER — Other Ambulatory Visit: Payer: Self-pay

## 2020-07-04 ENCOUNTER — Encounter (INDEPENDENT_AMBULATORY_CARE_PROVIDER_SITE_OTHER): Payer: Self-pay | Admitting: Bariatrics

## 2020-07-04 ENCOUNTER — Ambulatory Visit (INDEPENDENT_AMBULATORY_CARE_PROVIDER_SITE_OTHER): Payer: 59 | Admitting: Bariatrics

## 2020-07-04 VITALS — BP 134/93 | HR 58 | Temp 97.8°F | Ht 70.0 in | Wt 242.0 lb

## 2020-07-04 DIAGNOSIS — Z1331 Encounter for screening for depression: Secondary | ICD-10-CM

## 2020-07-04 DIAGNOSIS — E559 Vitamin D deficiency, unspecified: Secondary | ICD-10-CM

## 2020-07-04 DIAGNOSIS — Z6834 Body mass index (BMI) 34.0-34.9, adult: Secondary | ICD-10-CM

## 2020-07-04 DIAGNOSIS — I152 Hypertension secondary to endocrine disorders: Secondary | ICD-10-CM | POA: Diagnosis not present

## 2020-07-04 DIAGNOSIS — R5383 Other fatigue: Secondary | ICD-10-CM

## 2020-07-04 DIAGNOSIS — Z0289 Encounter for other administrative examinations: Secondary | ICD-10-CM

## 2020-07-04 DIAGNOSIS — E1159 Type 2 diabetes mellitus with other circulatory complications: Secondary | ICD-10-CM

## 2020-07-04 DIAGNOSIS — M1611 Unilateral primary osteoarthritis, right hip: Secondary | ICD-10-CM | POA: Diagnosis not present

## 2020-07-04 DIAGNOSIS — E1169 Type 2 diabetes mellitus with other specified complication: Secondary | ICD-10-CM

## 2020-07-04 DIAGNOSIS — R0602 Shortness of breath: Secondary | ICD-10-CM | POA: Diagnosis not present

## 2020-07-04 DIAGNOSIS — E785 Hyperlipidemia, unspecified: Secondary | ICD-10-CM

## 2020-07-04 DIAGNOSIS — E6609 Other obesity due to excess calories: Secondary | ICD-10-CM

## 2020-07-04 DIAGNOSIS — Z9189 Other specified personal risk factors, not elsewhere classified: Secondary | ICD-10-CM | POA: Diagnosis not present

## 2020-07-04 DIAGNOSIS — E119 Type 2 diabetes mellitus without complications: Secondary | ICD-10-CM | POA: Diagnosis not present

## 2020-07-04 DIAGNOSIS — E66811 Obesity, class 1: Secondary | ICD-10-CM

## 2020-07-05 ENCOUNTER — Encounter (INDEPENDENT_AMBULATORY_CARE_PROVIDER_SITE_OTHER): Payer: Self-pay | Admitting: Bariatrics

## 2020-07-05 DIAGNOSIS — E559 Vitamin D deficiency, unspecified: Secondary | ICD-10-CM | POA: Insufficient documentation

## 2020-07-05 LAB — TSH: TSH: 2.17 u[IU]/mL (ref 0.450–4.500)

## 2020-07-05 LAB — INSULIN, RANDOM: INSULIN: 10.6 u[IU]/mL (ref 2.6–24.9)

## 2020-07-05 LAB — VITAMIN D 25 HYDROXY (VIT D DEFICIENCY, FRACTURES): Vit D, 25-Hydroxy: 7.3 ng/mL — ABNORMAL LOW (ref 30.0–100.0)

## 2020-07-05 LAB — T3: T3, Total: 121 ng/dL (ref 71–180)

## 2020-07-05 LAB — T4, FREE: Free T4: 1.13 ng/dL (ref 0.82–1.77)

## 2020-07-05 LAB — C-PEPTIDE: C-Peptide: 2.2 ng/mL (ref 1.1–4.4)

## 2020-07-05 NOTE — Progress Notes (Signed)
Chief Complaint:   OBESITY Marvin [name] (MR# [id number]) is a 70 y.o. male who presents for evaluation and treatment of obesity and related comorbidities. Current BMI is Body mass index is 34.72 kg/m. Keymarion has been struggling with his weight for many years and has been unsuccessful in either losing weight, maintaining weight loss, or reaching his healthy weight goal.  Randen likes to cook at times. He notes that he craves protein.  Trayton is currently in the action stage of change and ready to dedicate time achieving and maintaining a healthier weight. Dietrich is interested in becoming our patient and working on intensive lifestyle modifications including (but not limited to) diet and exercise for weight loss.  Oscar's habits were reviewed today and are as follows: His family eats meals together, he thinks his family will eat healthier with him, his desired weight loss is 42 lbs, he has been heavy most of his life, he started gaining weight after he stopped drinking and smoking, his heaviest weight ever was 260 pounds, he has significant food cravings issues, he snacks frequently in the evenings, he frequently makes poor food choices and he frequently eats larger portions than normal.  Depression Screen Karrington's Food and Mood (modified PHQ-9) score was 3.  Depression screen PHQ 2/9 07/04/2020  Decreased Interest 1  Down, Depressed, Hopeless 0  PHQ - 2 Score 1  Altered sleeping 0  Tired, decreased energy 1  Change in appetite 1  Feeling bad or failure about yourself  0  Trouble concentrating 0  Moving slowly or fidgety/restless 0  Suicidal thoughts 0  PHQ-9 Score 3  Difficult doing work/chores Somewhat difficult   Subjective:   1. Other fatigue Mahamed admits to daytime somnolence and denies waking up still tired. Patent has a history of symptoms of daytime fatigue. Viaan generally gets 5 hours of sleep per night, and states that he has difficulty falling asleep  and generally restful sleep. Snoring is present. Apneic episodes are not present. Epworth Sleepiness Score is 7.  2. SOB (shortness of breath) on exertion [name] notes increasing shortness of breath with exercising and seems to be worsening over time with weight gain. He notes getting out of breath sooner with activity than he used to. This has not gotten worse recently. Suyash denies shortness of breath at rest or orthopnea.  3. Hypertension associated with diabetes [facility]) Jaymere is on amlodipine, but he did not take his medication today.  4. Type 2 diabetes mellitus with other specified complication, without long-term current use of insulin (HCC) Gervis is not on medications, and his last A1c was 6.4.  5. Hyperlipidemia associated with type 2 diabetes mellitus (HCC) Dreux is taking Simvastatin currently.  6. Primary osteoarthritis of right hip He is currently in pain and plans on orthopaedic surgery in the future.   7. Vitamin D deficiency Mervil is not on Vit D.  8. At risk for activity intolerance Khiyan is at risk for exercise intolerance due to obesity and osteoarthritis of the right hip.  Assessment/Plan:   1. Other fatigue Jorah does feel that his weight is causing his energy to be lower than it should be. Fatigue may be related to obesity, depression or many other causes. Labs will be ordered, and in the meanwhile, Verne will focus on self care including making healthy food choices, increasing physical activity and focusing on stress reduction.  - EKG 12-Lead - Insulin, random - T3 - T4, free - TSH - VITAMIN D 25  Hydroxy (Vit-D Deficiency, Fractures)  2. SOB (shortness of breath) on exertion Batu does feel that he gets out of breath more easily that he used to when he exercises. Khyri's shortness of breath appears to be obesity related and exercise induced. He has agreed to work on weight loss and gradually increase exercise to treat his exercise induced  shortness of breath. Will continue to monitor closely.  3. Hypertension associated with diabetes [facility]) Sovereign will continue his medications, and will continue working on healthy weight loss and exercise to improve blood pressure control. We will watch for signs of hypotension as he continues his lifestyle modifications.  4. Type 2 diabetes mellitus with other specified complication, without long-term current use of insulin (HCC) We will check labs today. Markham will decrease carbohydrates, and increase protein and healthy fats. Good blood sugar control is important to decrease the likelihood of diabetic complications such as nephropathy, neuropathy, limb loss, blindness, coronary artery disease, and death. Intensive lifestyle modification including diet, exercise and weight loss are the first line of treatment for diabetes.   - C-peptide  5. Hyperlipidemia associated with type 2 diabetes mellitus (HCC) Cardiovascular risk and specific lipid/LDL goals reviewed. We discussed several lifestyle modifications today. Christopherjame will continue his medications, and will continue to work on diet, exercise and weight loss efforts. Orders and follow up as documented in patient record.   Counseling Intensive lifestyle modifications are the first line treatment for this issue. . Dietary changes: Increase soluble fiber. Decrease simple carbohydrates. . Exercise changes: Moderate to vigorous-intensity aerobic activity 150 minutes per week if tolerated. . Lipid-lowering medications: see documented in medical record.  6. Primary osteoarthritis of right hip Debra is to not do any pounding exercise, and he will continue to follow up as directed.  7. Vitamin D deficiency Low Vitamin D level contributes to fatigue and are associated with obesity, breast, and colon cancer. We will check labs today. Kristine will follow-up for routine testing of Vitamin D, at least 2-3 times per year to avoid over-replacement.  -  VITAMIN D 25 Hydroxy (Vit-D Deficiency, Fractures)  8. Depression screen Dennison had a negative depression screening. Depression is commonly associated with obesity and often results in emotional eating behaviors. We will monitor this closely and work on CBT to help improve the non-hunger eating patterns. Referral to Psychology may be required if no improvement is seen as he continues in our clinic.  9. At risk for activity intolerance Zaydin was given approximately 15 minutes of exercise intolerance counseling today. He is 70 y.o. male and has risk factors exercise intolerance including obesity. We discussed intensive lifestyle modifications today with an emphasis on specific weight loss instructions and strategies. Jontue will slowly increase activity as tolerated.  Repetitive spaced learning was employed today to elicit superior memory formation and behavioral change.  10. Obesity, current BMI 34 Jessey is currently in the action stage of change and his goal is to continue with weight loss efforts. I recommend Damein begin the structured treatment plan as follows:  He has agreed to the Category 2 Plan.  I reviewed labs from 05/11/2020 with the patient today.   Exercise goals: He will start swimming.   Behavioral modification strategies: increasing lean protein intake, decreasing simple carbohydrates, increasing vegetables, increasing water intake, decreasing eating out, no skipping meals, meal planning and cooking strategies, keeping healthy foods in the home and planning for success.  He was informed of the importance of frequent follow-up visits to maximize his success with intensive  lifestyle modifications for his multiple health conditions. He was informed we would discuss his lab results at his next visit unless there is a critical issue that needs to be addressed sooner. Ruffus agreed to keep his next visit at the agreed upon time to discuss these results.  Objective:   Blood  pressure (!) 134/93, pulse (!) 58, temperature 97.8 F (36.6 C), height 5\' 10"  (1.778 m), weight 242 lb (109.8 kg), SpO2 97 %. Body mass index is 34.72 kg/m.  EKG: Normal sinus rhythm, rate 57 BPM.  Indirect Calorimeter completed today shows a VO2 of 240 and a REE of 1668.  His calculated basal metabolic rate is thus his basal metabolic rate is worse than expected.  General: Cooperative, alert, well developed, in no acute distress. HEENT: Conjunctivae and lids unremarkable. Cardiovascular: Regular rhythm.  Lungs: Normal work of breathing. Neurologic: No focal deficits.   Lab Results  Component Value Date   CREATININE 1.06 05/11/2020   BUN 13 05/11/2020   NA 141 05/11/2020   K 4.2 05/11/2020   CL 103 05/11/2020   CO2 30 05/11/2020   Lab Results  Component Value Date   ALT 15 05/11/2020   AST 17 05/11/2020   ALKPHOS 102 05/11/2020   BILITOT 0.7 05/11/2020   Lab Results  Component Value Date   HGBA1C 6.4 05/11/2020   HGBA1C 6.3 (H) 09/30/2019   HGBA1C 6.2 03/01/2019   HGBA1C 6.3 (H) 08/03/2018   HGBA1C 6.7 (H) 02/24/2018   Lab Results  Component Value Date   INSULIN 10.6 07/04/2020   Lab Results  Component Value Date   TSH 2.170 07/04/2020   Lab Results  Component Value Date   CHOL 199 05/11/2020   HDL 44.80 05/11/2020   LDLCALC 142 (H) 05/11/2020   TRIG 59.0 05/11/2020   CHOLHDL 4 05/11/2020   Lab Results  Component Value Date   WBC 15.4 (H) 10/04/2019   HGB 12.6 (L) 10/04/2019   HCT 37.8 (L) 10/04/2019   MCV 86.7 10/04/2019   PLT 182 10/04/2019   No results found for: IRON, TIBC, FERRITIN  Attestation Statements:   Reviewed by clinician on day of visit: allergies, medications, problem list, medical history, surgical history, family history, social history, and previous encounter notes.   10/06/2019, am acting as Trude Mcburney for Energy manager, DO.  I have reviewed the above documentation for accuracy and completeness, and I agree with  the above. [employer], DO

## 2020-07-18 ENCOUNTER — Ambulatory Visit (INDEPENDENT_AMBULATORY_CARE_PROVIDER_SITE_OTHER): Payer: 59 | Admitting: Bariatrics

## 2020-07-18 ENCOUNTER — Encounter (INDEPENDENT_AMBULATORY_CARE_PROVIDER_SITE_OTHER): Payer: Self-pay | Admitting: Bariatrics

## 2020-07-18 ENCOUNTER — Other Ambulatory Visit: Payer: Self-pay

## 2020-07-18 ENCOUNTER — Ambulatory Visit (INDEPENDENT_AMBULATORY_CARE_PROVIDER_SITE_OTHER): Payer: Self-pay | Admitting: Family Medicine

## 2020-07-18 VITALS — BP 130/80 | HR 65 | Temp 97.8°F | Ht 70.0 in | Wt 235.0 lb

## 2020-07-18 DIAGNOSIS — E1169 Type 2 diabetes mellitus with other specified complication: Secondary | ICD-10-CM

## 2020-07-18 DIAGNOSIS — E6609 Other obesity due to excess calories: Secondary | ICD-10-CM | POA: Diagnosis not present

## 2020-07-18 DIAGNOSIS — Z6834 Body mass index (BMI) 34.0-34.9, adult: Secondary | ICD-10-CM

## 2020-07-18 DIAGNOSIS — E559 Vitamin D deficiency, unspecified: Secondary | ICD-10-CM

## 2020-07-18 DIAGNOSIS — E669 Obesity, unspecified: Secondary | ICD-10-CM | POA: Diagnosis not present

## 2020-07-18 MED ORDER — VITAMIN D (ERGOCALCIFEROL) 1.25 MG (50000 UNIT) PO CAPS
50000.0000 [IU] | ORAL_CAPSULE | ORAL | 0 refills | Status: DC
Start: 2020-07-18 — End: 2020-08-27

## 2020-07-19 NOTE — Progress Notes (Signed)
Chief Complaint:   OBESITY Marvin [name] is here to discuss his progress with his obesity treatment plan along with follow-up of his obesity related diagnoses. Marvin [name] is on the Category 2 Plan and states he is following his eating plan approximately 80% of the time. Marvin [name] states he is walking and lifting boxes for 4 hours 4 times per week.  Today's visit was #: 2 Starting weight: 242 lbs Starting date: 07/04/2020 Today's weight: 235 lbs Today's date: 07/18/2020 Total lbs lost to date: 7 lbs Total lbs lost since last in-office visit: 7 lbs  Interim History: Marvin [name] is down 7 pounds since his first visit.  He states that it was hard, but he is doing okay.  He has hunger between his breakfast and lunch.  Subjective:   1. Vitamin D deficiency Marvin [name]'s Vitamin D level was 7.3 on 07/04/2020. He is currently taking no vitamin D supplement.   2. Diabetes mellitus type 2 in obese (HCC) No medications.  A1c 6.4, insulin 10.6.  Lab Results  Component Value Date   HGBA1C 6.4 05/11/2020   HGBA1C 6.3 (H) 09/30/2019   HGBA1C 6.2 03/01/2019   Lab Results  Component Value Date   MICROALBUR 0.7 05/11/2020   LDLCALC 142 (H) 05/11/2020   CREATININE 1.06 05/11/2020   Lab Results  Component Value Date   INSULIN 10.6 07/04/2020   Assessment/Plan:   1. Vitamin D deficiency Low Vitamin D level contributes to fatigue and are associated with obesity, breast, and colon cancer. He agrees to start to take prescription Vitamin D @50 ,000 IU every week and will follow-up for routine testing of Vitamin D, at least 2-3 times per year to avoid over-replacement.  - Start Vitamin D, Ergocalciferol, (DRISDOL) 1.25 MG (50000 UNIT) CAPS capsule; Take 1 capsule (50,000 Units total) by mouth every 7 (seven) days.  Dispense: 12 capsule; Refill: 0  2. Diabetes mellitus type 2 in obese (HCC) Good blood sugar control is important to decrease the likelihood of diabetic complications such as nephropathy, neuropathy,  limb loss, blindness, coronary artery disease, and death. Intensive lifestyle modification including diet, exercise and weight loss are the first line of treatment for diabetes.  Decrease carbohydrates.  Continue cardio and resistance training.    3. Obesity, current BMI 85  Marvin [name] is currently in the action stage of change. As such, his goal is to continue with weight loss efforts. He has agreed to the Category 2 Plan.   He will work on meal planning and protein equivalents.  Reviewed labs from 07/04/2020, including vitamin D, insulin level, C-peptide, and thyroid panel, with patient today.  Exercise goals: Walking 10,000-12,000 steps nightly at work.  Behavioral modification strategies: increasing lean protein intake, decreasing simple carbohydrates, increasing vegetables, increasing water intake, decreasing eating out, no skipping meals, meal planning and cooking strategies, keeping healthy foods in the home and planning for success.  Marvin [name] has agreed to follow-up with our clinic in 2 weeks. He was informed of the importance of frequent follow-up visits to maximize his success with intensive lifestyle modifications for his multiple health conditions.   Objective:   Blood pressure 130/80, pulse 65, temperature 97.8 F (36.6 C), height 5\' 10"  (1.778 m), weight 235 lb (106.6 kg), SpO2 97 %. Body mass index is 33.72 kg/m.  General: Cooperative, alert, well developed, in no acute distress. HEENT: Conjunctivae and lids unremarkable. Cardiovascular: Regular rhythm.  Lungs: Normal work of breathing. Neurologic: No focal deficits.   Lab Results  Component Value Date   CREATININE  1.06 05/11/2020   BUN 13 05/11/2020   NA 141 05/11/2020   K 4.2 05/11/2020   CL 103 05/11/2020   CO2 30 05/11/2020   Lab Results  Component Value Date   ALT 15 05/11/2020   AST 17 05/11/2020   ALKPHOS 102 05/11/2020   BILITOT 0.7 05/11/2020   Lab Results  Component Value Date   HGBA1C 6.4 05/11/2020    HGBA1C 6.3 (H) 09/30/2019   HGBA1C 6.2 03/01/2019   HGBA1C 6.3 (H) 08/03/2018   HGBA1C 6.7 (H) 02/24/2018   Lab Results  Component Value Date   INSULIN 10.6 07/04/2020   Lab Results  Component Value Date   TSH 2.170 07/04/2020   Lab Results  Component Value Date   CHOL 199 05/11/2020   HDL 44.80 05/11/2020   LDLCALC 142 (H) 05/11/2020   TRIG 59.0 05/11/2020   CHOLHDL 4 05/11/2020   Lab Results  Component Value Date   WBC 15.4 (H) 10/04/2019   HGB 12.6 (L) 10/04/2019   HCT 37.8 (L) 10/04/2019   MCV 86.7 10/04/2019   PLT 182 10/04/2019   Obesity Behavioral Intervention:   Approximately 15 minutes were spent on the discussion below.  ASK: We discussed the diagnosis of obesity with Marvin [name] today and Marvin [name] agreed to give [place] permission to discuss obesity behavioral modification therapy today.  ASSESS: Marvin [name] has the diagnosis of obesity and his BMI today is 33.8. Marvin [name] is in the action stage of change.   ADVISE: Marvin [name] was educated on the multiple health risks of obesity as well as the benefit of weight loss to improve his health. He was advised of the need for long term treatment and the importance of lifestyle modifications to improve his current health and to decrease his risk of future health problems.  AGREE: Multiple dietary modification options and treatment options were discussed and Marvin [name] agreed to follow the recommendations documented in the above note.  ARRANGE: Marvin [name] was educated on the importance of frequent visits to treat obesity as outlined per CMS and USPSTF guidelines and agreed to schedule his next follow up appointment today.  Attestation Statements:   Reviewed by clinician on day of visit: allergies, medications, problem list, medical history, surgical history, family history, social history, and previous encounter notes.  I, Insurance claims handler, CMA, am acting as Energy manager for [employer], DO  I have reviewed the above documentation  for accuracy and completeness, and I agree with the above. Corinna Capra, DO

## 2020-07-23 ENCOUNTER — Encounter (INDEPENDENT_AMBULATORY_CARE_PROVIDER_SITE_OTHER): Payer: Self-pay | Admitting: Bariatrics

## 2020-07-30 ENCOUNTER — Other Ambulatory Visit: Payer: Self-pay | Admitting: Nurse Practitioner

## 2020-07-30 DIAGNOSIS — J301 Allergic rhinitis due to pollen: Secondary | ICD-10-CM

## 2020-08-03 DIAGNOSIS — M5459 Other low back pain: Secondary | ICD-10-CM | POA: Diagnosis not present

## 2020-08-03 DIAGNOSIS — M50222 Other cervical disc displacement at C5-C6 level: Secondary | ICD-10-CM | POA: Diagnosis not present

## 2020-08-03 DIAGNOSIS — M546 Pain in thoracic spine: Secondary | ICD-10-CM | POA: Diagnosis not present

## 2020-08-03 DIAGNOSIS — M5137 Other intervertebral disc degeneration, lumbosacral region: Secondary | ICD-10-CM | POA: Diagnosis not present

## 2020-08-03 DIAGNOSIS — M9902 Segmental and somatic dysfunction of thoracic region: Secondary | ICD-10-CM | POA: Diagnosis not present

## 2020-08-03 DIAGNOSIS — M9903 Segmental and somatic dysfunction of lumbar region: Secondary | ICD-10-CM | POA: Diagnosis not present

## 2020-08-03 DIAGNOSIS — M542 Cervicalgia: Secondary | ICD-10-CM | POA: Diagnosis not present

## 2020-08-03 DIAGNOSIS — M9901 Segmental and somatic dysfunction of cervical region: Secondary | ICD-10-CM | POA: Diagnosis not present

## 2020-08-06 ENCOUNTER — Ambulatory Visit (INDEPENDENT_AMBULATORY_CARE_PROVIDER_SITE_OTHER): Payer: 59 | Admitting: Bariatrics

## 2020-08-06 NOTE — Telephone Encounter (Signed)
Patient is calling to check the status of his refill request. He states that he's been trying to get this refill since 06/13. Please advise.

## 2020-08-13 DIAGNOSIS — M5137 Other intervertebral disc degeneration, lumbosacral region: Secondary | ICD-10-CM | POA: Diagnosis not present

## 2020-08-13 DIAGNOSIS — M9902 Segmental and somatic dysfunction of thoracic region: Secondary | ICD-10-CM | POA: Diagnosis not present

## 2020-08-13 DIAGNOSIS — M50222 Other cervical disc displacement at C5-C6 level: Secondary | ICD-10-CM | POA: Diagnosis not present

## 2020-08-13 DIAGNOSIS — M546 Pain in thoracic spine: Secondary | ICD-10-CM | POA: Diagnosis not present

## 2020-08-13 DIAGNOSIS — M9901 Segmental and somatic dysfunction of cervical region: Secondary | ICD-10-CM | POA: Diagnosis not present

## 2020-08-13 DIAGNOSIS — M542 Cervicalgia: Secondary | ICD-10-CM | POA: Diagnosis not present

## 2020-08-13 DIAGNOSIS — M5459 Other low back pain: Secondary | ICD-10-CM | POA: Diagnosis not present

## 2020-08-13 DIAGNOSIS — M9903 Segmental and somatic dysfunction of lumbar region: Secondary | ICD-10-CM | POA: Diagnosis not present

## 2020-08-15 ENCOUNTER — Ambulatory Visit: Payer: Medicare HMO

## 2020-08-27 ENCOUNTER — Other Ambulatory Visit: Payer: Self-pay

## 2020-08-27 ENCOUNTER — Encounter (INDEPENDENT_AMBULATORY_CARE_PROVIDER_SITE_OTHER): Payer: Self-pay | Admitting: Bariatrics

## 2020-08-27 ENCOUNTER — Ambulatory Visit (INDEPENDENT_AMBULATORY_CARE_PROVIDER_SITE_OTHER): Payer: 59 | Admitting: Bariatrics

## 2020-08-27 VITALS — BP 136/86 | HR 63 | Temp 98.1°F | Ht 70.0 in | Wt 239.0 lb

## 2020-08-27 DIAGNOSIS — I152 Hypertension secondary to endocrine disorders: Secondary | ICD-10-CM

## 2020-08-27 DIAGNOSIS — E6609 Other obesity due to excess calories: Secondary | ICD-10-CM

## 2020-08-27 DIAGNOSIS — E1159 Type 2 diabetes mellitus with other circulatory complications: Secondary | ICD-10-CM

## 2020-08-27 DIAGNOSIS — E559 Vitamin D deficiency, unspecified: Secondary | ICD-10-CM

## 2020-08-27 DIAGNOSIS — E1169 Type 2 diabetes mellitus with other specified complication: Secondary | ICD-10-CM

## 2020-08-27 DIAGNOSIS — Z9189 Other specified personal risk factors, not elsewhere classified: Secondary | ICD-10-CM

## 2020-08-27 DIAGNOSIS — E669 Obesity, unspecified: Secondary | ICD-10-CM | POA: Diagnosis not present

## 2020-08-27 DIAGNOSIS — Z6834 Body mass index (BMI) 34.0-34.9, adult: Secondary | ICD-10-CM | POA: Diagnosis not present

## 2020-08-27 MED ORDER — VITAMIN D (ERGOCALCIFEROL) 1.25 MG (50000 UNIT) PO CAPS: 50000.0000 [IU] | ORAL_CAPSULE | ORAL | 0 refills | Status: DC

## 2020-08-27 MED ORDER — INSULIN PEN NEEDLE 32G X 4 MM MISC: 0 refills | Status: DC

## 2020-08-27 MED ORDER — OZEMPIC (0.25 OR 0.5 MG/DOSE) 2 MG/1.5ML ~~LOC~~ SOPN: 0.2500 mg | PEN_INJECTOR | SUBCUTANEOUS | 0 refills | Status: DC

## 2020-08-29 NOTE — Progress Notes (Signed)
Chief Complaint:   OBESITY Marvin [name] is here to discuss his progress with his obesity treatment plan along with follow-up of his obesity related diagnoses. Marvin [name] is on the Category 2 Plan and states he is following his eating plan approximately 60% of the time. Marvin [name] states he is walking 12,000 steps 4-5 times per week.  Today's visit was #: 3 Starting weight: 242 lbs Starting date: 07/04/2020 Today's weight: 239 lbs Today's date: 08/27/2020 Total lbs lost to date: 3 lbs Total lbs lost since last in-office visit: 0  Interim History: Marvin [name] is up 4 lbs since his last visit.  It has been "tough" (increased appetite). He is doing well with his water intake.  Subjective:   1. Vitamin D deficiency Marvin [name] is taking Vitamin D. He denies muscle weakness, nausea and vomiting.  2. Hypertension associated with diabetes (HCC) Marvin [name] is taking Norvasc.  3. Diabetes mellitus type 2 in obese [facility]) Marvin [name] is not on any medications.  4. At risk for dehydration Marvin [name] is at risk for dehydration due to weather.  Assessment/Plan:   1. Vitamin D deficiency Low Vitamin D level contributes to fatigue and are associated with obesity, breast, and colon cancer. We will refill his medications today for 1 month with no refills.He agrees to continue to take prescription Vitamin D 50,000 IU every week and will follow-up for routine testing of Vitamin D, at least 2-3 times per year to avoid over-replacement.  - Vitamin D, Ergocalciferol, (DRISDOL) 1.25 MG (50000 UNIT) CAPS capsule; Take 1 capsule (50,000 Units total) by mouth every 7 (seven) days.  Dispense: 12 capsule; Refill: 0  2. Hypertension associated with diabetes [facility] [name]) Marvin [name] will continue his medication and is working on healthy weight loss and exercise to improve blood pressure control. We will watch for signs of hypotension as he continues his lifestyle modifications.   3. Diabetes mellitus type 2 in obese [facility]) Marvin [name] agreed to  start Ozempic 0.25 mg. We will refill his medications for 1 month with no refills.Good blood sugar control is important to decrease the likelihood of diabetic complications such as nephropathy, neuropathy, limb loss, blindness, coronary artery disease, and death. Intensive lifestyle modification including diet, exercise and weight loss are the first line of treatment for diabetes.   - Semaglutide,0.25 or 0.5MG /DOS, (OZEMPIC, 0.25 OR 0.5 MG/DOSE,) 2 MG/1.5ML SOPN; Inject 0.25 mg into the skin once a week.  Dispense: 1.5 mL; Refill: 0 - Insulin Pen Needle 32G X 4 MM MISC; Use with the Ozempic weekly  Dispense: 50 each; Refill: 0  4. At risk for dehydration Marvin [name] was given approximately 15 minutes dehydration prevention counseling today. Marvin [name] is at risk for dehydration due to weight loss and current medication(s). He was encouraged to hydrate and monitor fluid status to avoid dehydration as well as weight loss plateaus.    5. Obesity, current BMI 34.4 Marvin [name] is currently in the action stage of change. As such, his goal is to continue with weight loss efforts. He has agreed to the Category 2 Plan.   Marvin [name] will adhere closely to the plan 80-90 percent.  He will increase H2O intake and keep intake high.  Exercise goals:  As is. Continue Steps  Behavioral modification strategies: increasing lean protein intake, decreasing simple carbohydrates, increasing vegetables, increasing water intake, decreasing eating out, no skipping meals, meal planning and cooking strategies, keeping healthy foods in the home, and planning for success.  Marvin [name] has agreed to follow-up with our clinic in 2 weeks. He was informed  of the importance of frequent follow-up visits to maximize his success with intensive lifestyle modifications for his multiple health conditions.   Objective:   Blood pressure 136/86, pulse 63, temperature 98.1 F (36.7 C), height 5\' 10"  (1.778 m), weight 239 lb (108.4 kg), SpO2 97 %. Body  mass index is 34.29 kg/m.  General: Cooperative, alert, well developed, in no acute distress. HEENT: Conjunctivae and lids unremarkable. Cardiovascular: Regular rhythm.  Lungs: Normal work of breathing. Neurologic: No focal deficits.   Lab Results  Component Value Date   CREATININE 1.06 05/11/2020   BUN 13 05/11/2020   NA 141 05/11/2020   K 4.2 05/11/2020   CL 103 05/11/2020   CO2 30 05/11/2020   Lab Results  Component Value Date   ALT 15 05/11/2020   AST 17 05/11/2020   ALKPHOS 102 05/11/2020   BILITOT 0.7 05/11/2020   Lab Results  Component Value Date   HGBA1C 6.4 05/11/2020   HGBA1C 6.3 (H) 09/30/2019   HGBA1C 6.2 03/01/2019   HGBA1C 6.3 (H) 08/03/2018   HGBA1C 6.7 (H) 02/24/2018   Lab Results  Component Value Date   INSULIN 10.6 07/04/2020   Lab Results  Component Value Date   TSH 2.170 07/04/2020   Lab Results  Component Value Date   CHOL 199 05/11/2020   HDL 44.80 05/11/2020   LDLCALC 142 (H) 05/11/2020   TRIG 59.0 05/11/2020   CHOLHDL 4 05/11/2020   Lab Results  Component Value Date   VD25OH 7.3 (L) 07/04/2020   Lab Results  Component Value Date   WBC 15.4 (H) 10/04/2019   HGB 12.6 (L) 10/04/2019   HCT 37.8 (L) 10/04/2019   MCV 86.7 10/04/2019   PLT 182 10/04/2019   No results found for: IRON, TIBC, FERRITIN  Attestation Statements:   Reviewed by clinician on day of visit: allergies, medications, problem list, medical history, surgical history, family history, social history, and previous encounter notes.  I, 10/06/2019, RMA, am acting as [name] for Energy manager, DO.   I have reviewed the above documentation for accuracy and completeness, and I agree with the above. [employer], DO

## 2020-09-02 ENCOUNTER — Other Ambulatory Visit: Payer: Self-pay | Admitting: Nurse Practitioner

## 2020-09-03 ENCOUNTER — Encounter (INDEPENDENT_AMBULATORY_CARE_PROVIDER_SITE_OTHER): Payer: Self-pay | Admitting: Bariatrics

## 2020-09-03 DIAGNOSIS — M5459 Other low back pain: Secondary | ICD-10-CM | POA: Diagnosis not present

## 2020-09-03 DIAGNOSIS — M546 Pain in thoracic spine: Secondary | ICD-10-CM | POA: Diagnosis not present

## 2020-09-03 DIAGNOSIS — M9901 Segmental and somatic dysfunction of cervical region: Secondary | ICD-10-CM | POA: Diagnosis not present

## 2020-09-03 DIAGNOSIS — M9903 Segmental and somatic dysfunction of lumbar region: Secondary | ICD-10-CM | POA: Diagnosis not present

## 2020-09-03 DIAGNOSIS — M5137 Other intervertebral disc degeneration, lumbosacral region: Secondary | ICD-10-CM | POA: Diagnosis not present

## 2020-09-03 DIAGNOSIS — M9902 Segmental and somatic dysfunction of thoracic region: Secondary | ICD-10-CM | POA: Diagnosis not present

## 2020-09-03 DIAGNOSIS — M50222 Other cervical disc displacement at C5-C6 level: Secondary | ICD-10-CM | POA: Diagnosis not present

## 2020-09-03 DIAGNOSIS — M542 Cervicalgia: Secondary | ICD-10-CM | POA: Diagnosis not present

## 2020-09-03 NOTE — Telephone Encounter (Signed)
Pt states he did not get the last refill on this medication and states he is still in need of this medication. Pt states he still needs medication Please advise.

## 2020-09-04 ENCOUNTER — Ambulatory Visit (INDEPENDENT_AMBULATORY_CARE_PROVIDER_SITE_OTHER): Payer: 59 | Admitting: Orthopaedic Surgery

## 2020-09-04 ENCOUNTER — Ambulatory Visit (INDEPENDENT_AMBULATORY_CARE_PROVIDER_SITE_OTHER): Payer: 59

## 2020-09-04 ENCOUNTER — Encounter: Payer: Self-pay | Admitting: Orthopaedic Surgery

## 2020-09-04 VITALS — Ht 70.0 in | Wt 239.0 lb

## 2020-09-04 DIAGNOSIS — Z96641 Presence of right artificial hip joint: Secondary | ICD-10-CM

## 2020-09-04 MED ORDER — TRAMADOL HCL 50 MG PO TABS: 50.0000 mg | ORAL_TABLET | Freq: Two times a day (BID) | ORAL | 2 refills | Status: DC | PRN

## 2020-09-04 NOTE — Progress Notes (Signed)
Post-Op Visit Note   Patient: Marvin [name]           Date of Birth: 10-30-1950           MRN: [id number] Visit Date: 09/04/2020 PCP: Anne Ng, NP   Assessment & Plan:  Chief Complaint:  Chief Complaint  Patient presents with   Right Hip - Follow-up    Right total hip arthroplasty 10/03/2019   Visit Diagnoses:  1. Status post total hip replacement, right     Plan: Patient is a very pleasant 70 year old gentleman who comes in today almost 1 year out right total hip replacement date of surgery 10/03/2019.  He has been doing well.  He notes some soreness and discomfort after work when he was lifting heavy boxes.  He does take an occasional Tylenol for this.  Examination of the right hip reveals a painless logroll.  Painless hip flexion.  He is neurovascular intact distally.  At this point, I recommended to continue advance activity as tolerated.  Dental prophylaxis reinforced.  Follow-up with [place] in 1 years time for repeat evaluation and AP pelvis x-rays.  Call with concerns or questions in the interim.  Follow-Up Instructions: Return in about 1 year (around 09/04/2021).   Orders:  Orders Placed This Encounter  Procedures   XR Pelvis 1-2 Views   Meds ordered this encounter  Medications   traMADol (ULTRAM) 50 MG tablet    Sig: Take 1 tablet (50 mg total) by mouth 2 (two) times daily as needed.    Dispense:  30 tablet    Refill:  2     Imaging: No results found.  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   Ventral hernia without obstruction or gangrene 03/15/2020   Primary osteoarthritis of right hip 10/03/2019   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   Chronic bilateral low back pain without sciatica 02/21/2019   Type 2 diabetes mellitus without complication, without long-term current use of insulin (HCC) 08/25/2017   Hyperuricemia 08/25/2017    Acid reflux 02/23/2017   Gout 05/20/2016   HTN (hypertension) 09/11/2015   Hyperlipidemia 09/11/2015   Past Medical History:  Diagnosis Date   Alcohol abuse    Allergy    Arthritis    Back pain    Constipation 03/15/2020   Drug abuse (HCC)    GERD (gastroesophageal reflux disease)    Gout    Hernia    Hypercholesteremia    Hypertension    Joint pain    Obesity    Osteoarthritis    Prediabetes    Stomach ulcer    Swallowing difficulty    Wears dentures    bottom front    Family History  Problem Relation Age of Onset   Healthy Mother    Healthy Father    Cancer Brother        bone cancer   Cancer Maternal Aunt        breast cancer   Cancer Maternal Uncle        lukemia   Cancer Cousin        unknown   Cancer Maternal Uncle        lukemia   Cancer Paternal Uncle        prostate   Hearing loss Maternal Grandfather 59    Past Surgical History:  Procedure Laterality Date   HERNIA REPAIR  twice   REPLACEMENT TOTAL KNEE     partial   SHOULDER SURGERY     right   TOTAL HIP ARTHROPLASTY Right 10/03/2019   Procedure: RIGHT TOTAL HIP ARTHROPLASTY ANTERIOR APPROACH;  Surgeon: Tarry Kos, MD;  Location: [facility];  Service: Orthopedics;  Laterality: Right;   Social History   Occupational History   Not on file  Tobacco Use   Smoking status: Former    Types: Cigarettes    Quit date: 03/20/1998    Years since quitting: 22.4   Smokeless tobacco: Former    Types: Associate Professor Use: Never used  Substance and Sexual Activity   Alcohol use: Not Currently   Drug use: No   Sexual activity: Yes    Birth control/protection: Condom

## 2020-09-10 ENCOUNTER — Encounter (INDEPENDENT_AMBULATORY_CARE_PROVIDER_SITE_OTHER): Payer: Self-pay | Admitting: Bariatrics

## 2020-09-10 ENCOUNTER — Other Ambulatory Visit: Payer: Self-pay

## 2020-09-10 ENCOUNTER — Ambulatory Visit (INDEPENDENT_AMBULATORY_CARE_PROVIDER_SITE_OTHER): Payer: 59 | Admitting: Bariatrics

## 2020-09-10 VITALS — BP 135/82 | HR 64 | Temp 98.0°F | Ht 70.0 in | Wt 234.0 lb

## 2020-09-10 DIAGNOSIS — I152 Hypertension secondary to endocrine disorders: Secondary | ICD-10-CM

## 2020-09-10 DIAGNOSIS — E1169 Type 2 diabetes mellitus with other specified complication: Secondary | ICD-10-CM

## 2020-09-10 DIAGNOSIS — E669 Obesity, unspecified: Secondary | ICD-10-CM | POA: Diagnosis not present

## 2020-09-10 DIAGNOSIS — E6609 Other obesity due to excess calories: Secondary | ICD-10-CM

## 2020-09-10 DIAGNOSIS — Z6834 Body mass index (BMI) 34.0-34.9, adult: Secondary | ICD-10-CM

## 2020-09-10 DIAGNOSIS — E1159 Type 2 diabetes mellitus with other circulatory complications: Secondary | ICD-10-CM

## 2020-09-12 ENCOUNTER — Other Ambulatory Visit: Payer: Self-pay | Admitting: Nurse Practitioner

## 2020-09-12 DIAGNOSIS — E782 Mixed hyperlipidemia: Secondary | ICD-10-CM

## 2020-09-13 NOTE — Telephone Encounter (Signed)
Chart supports rx refill Last ov 05/10/20 Last refill 08/04/2020

## 2020-09-14 ENCOUNTER — Encounter (INDEPENDENT_AMBULATORY_CARE_PROVIDER_SITE_OTHER): Payer: Self-pay | Admitting: Bariatrics

## 2020-09-14 NOTE — Progress Notes (Signed)
Chief Complaint:   OBESITY Marvin [name] is here to discuss his progress with his obesity treatment plan along with follow-up of his obesity related diagnoses. Marvin [name] is on the Category 2 Plan and states he is following his eating plan approximately 80% of the time. Marvin [name] states he is walking 10,000 steps a day 5  times per week.  Today's visit was #: 4 Starting weight: 242 lbs Starting date: 07/04/2020 Today's weight: 234 lbs Today's date: 09/10/2020 Total lbs lost to date: 8 lbs Total lbs lost since last in-office visit: 5 lbs  Interim History: Marvin [name] is down an additional 5 lbs since his last visit. He is doing well with his protein and water intake.  Subjective:   1. Diabetes mellitus type 2 in obese Marvin [name]) Medications reviewed. Marvin [name] is currently taking Ozempic and reports a decrease in his appetite.  Lab Results  Component Value Date   HGBA1C 6.4 05/11/2020   HGBA1C 6.3 (H) 09/30/2019   HGBA1C 6.2 03/01/2019   Lab Results  Component Value Date   MICROALBUR 0.7 05/11/2020   LDLCALC 142 (H) 05/11/2020   CREATININE 1.06 05/11/2020   Lab Results  Component Value Date   INSULIN 10.6 07/04/2020     2. Hypertension associated with diabetes (HCC) Marvin [name]'s blood pressure is controlled.  Assessment/Plan:   1. Diabetes mellitus type 2 in obese (HCC) Good blood sugar control is important to decrease the likelihood of diabetic complications such as nephropathy, neuropathy, limb loss, blindness, coronary artery disease, and death. Intensive lifestyle modification including diet, exercise and weight loss are the first line of treatment for diabetes. Marvin [name] will continue with Ozempic.  2. Hypertension associated with diabetes (HCC) Marvin [name] is working on healthy weight loss and exercise to improve blood pressure control. We will watch for signs of hypotension as he continues his lifestyle modifications. Marvin [name] will continue his medication.  3. Obesity, current BMI  33.6 Marvin [name] is currently in the action stage of change. As such, his goal is to continue with weight loss efforts. He has agreed to the Category 2 Plan.   Meal planning Intentional eating  Exercise goals: As is  Behavioral modification strategies: increasing lean protein intake, decreasing simple carbohydrates, increasing vegetables, increasing water intake, decreasing eating out, no skipping meals, meal planning and cooking strategies, keeping healthy foods in the home, and planning for success.  Marvin [name] has agreed to follow-up with our clinic in 2 weeks. He was informed of the importance of frequent follow-up visits to maximize his success with intensive lifestyle modifications for his multiple health conditions.   Objective:   Blood pressure 135/82, pulse 64, temperature 98 F (36.7 C), height 5\' 10"  (1.778 m), weight 234 lb (106.1 kg), SpO2 97 %. Body mass index is 33.58 kg/m.  General: Cooperative, alert, well developed, in no acute distress. HEENT: Conjunctivae and lids unremarkable. Cardiovascular: Regular rhythm.  Lungs: Normal work of breathing. Neurologic: No focal deficits.   Lab Results  Component Value Date   CREATININE 1.06 05/11/2020   BUN 13 05/11/2020   NA 141 05/11/2020   K 4.2 05/11/2020   CL 103 05/11/2020   CO2 30 05/11/2020   Lab Results  Component Value Date   ALT 15 05/11/2020   AST 17 05/11/2020   ALKPHOS 102 05/11/2020   BILITOT 0.7 05/11/2020   Lab Results  Component Value Date   HGBA1C 6.4 05/11/2020   HGBA1C 6.3 (H) 09/30/2019   HGBA1C 6.2 03/01/2019   HGBA1C 6.3 (H) 08/03/2018   HGBA1C  6.7 (H) 02/24/2018   Lab Results  Component Value Date   INSULIN 10.6 07/04/2020   Lab Results  Component Value Date   TSH 2.170 07/04/2020   Lab Results  Component Value Date   CHOL 199 05/11/2020   HDL 44.80 05/11/2020   LDLCALC 142 (H) 05/11/2020   TRIG 59.0 05/11/2020   CHOLHDL 4 05/11/2020   Lab Results  Component Value Date   VD25OH  7.3 (L) 07/04/2020   Lab Results  Component Value Date   WBC 15.4 (H) 10/04/2019   HGB 12.6 (L) 10/04/2019   HCT 37.8 (L) 10/04/2019   MCV 86.7 10/04/2019   PLT 182 10/04/2019   No results found for: IRON, TIBC, FERRITIN  Attestation Statements:   Reviewed by clinician on day of visit: allergies, medications, problem list, medical history, surgical history, family history, social history, and previous encounter notes.  Time spent on visit including pre-visit chart review and post-visit care and charting was 20 minutes.   IPaulla Fore, CMA, am acting as transcriptionist for Dr. Corinna Capra, DO.  I have reviewed the above documentation for accuracy and completeness, and I agree with the above. Corinna Capra, DO

## 2020-09-17 ENCOUNTER — Ambulatory Visit (INDEPENDENT_AMBULATORY_CARE_PROVIDER_SITE_OTHER): Payer: 59 | Admitting: Bariatrics

## 2020-09-18 ENCOUNTER — Ambulatory Visit (INDEPENDENT_AMBULATORY_CARE_PROVIDER_SITE_OTHER): Payer: 59 | Admitting: *Deleted

## 2020-09-18 DIAGNOSIS — Z0000 Encounter for general adult medical examination without abnormal findings: Secondary | ICD-10-CM

## 2020-09-18 NOTE — Progress Notes (Signed)
Subjective:   Marvin [name] is a 70 y.o. male who presents for Medicare Annual/Subsequent preventive examination.  I connected with  Marvin [name] on 09/18/20 by a telephone  enabled telemedicine application and verified that I am speaking with the correct person using two identifiers.   I discussed the limitations of evaluation and management by telemedicine. The patient expressed understanding and agreed to proceed.  Review of Systems    na Cardiac Risk Factors include: advanced age (>77men, >6 women);hypertension;male gender;dyslipidemia     Objective:    Today's Vitals   There is no height or weight on file to calculate BMI.  Advanced Directives 09/18/2020 10/03/2019 09/30/2019 08/10/2019 04/03/2015  Does Patient Have a Medical Advance Directive? No No No No No  Would patient like information on creating a medical advance directive? No - Patient declined No - Patient declined - Yes (ED - Information included in AVS) -    Current Medications (verified) Outpatient Encounter Medications as of 09/18/2020  Medication Sig   allopurinol (ZYLOPRIM) 100 MG tablet TAKE ONE TABLET BY MOUTH TWICE A DAY NEEDS TO SCHEDULE FOLLOW UP APPOINTMENT   amLODipine (NORVASC) 10 MG tablet Take 1 tablet (10 mg total) by mouth daily.   amoxicillin (AMOXIL) 500 MG capsule Take 1,000 mg by mouth 2 (two) times daily.   aspirin EC 81 MG tablet Take 1 tablet (81 mg total) by mouth 2 (two) times daily.   colchicine 0.6 MG tablet Take 0.6 mg by mouth as needed.   cyclobenzaprine (FLEXERIL) 10 MG tablet TAKE ONE-HALF TABLET BY MOUTH EVERY NIGHT AT BEDTIME AS NEEDED FOR MUSCLE SPASMS   fluticasone (FLONASE) 50 MCG/ACT nasal spray SPRAY TWO SPRAYS IN EACH NOSTRIL ONCE DAILY AS DIRECTED   Insulin Pen Needle 32G X 4 MM MISC Use with the Ozempic weekly   Semaglutide,0.25 or 0.5MG /DOS, (OZEMPIC, 0.25 OR 0.5 MG/DOSE,) 2 MG/1.5ML SOPN Inject 0.25 mg into the skin once a week.   simvastatin (ZOCOR) 20 MG tablet TAKE  1 TABLET BY MOUTH TWICE WEEKLY   traMADol (ULTRAM) 50 MG tablet Take 1 tablet (50 mg total) by mouth 2 (two) times daily as needed.   Vitamin D, Ergocalciferol, (DRISDOL) 1.25 MG (50000 UNIT) CAPS capsule Take 1 capsule (50,000 Units total) by mouth every 7 (seven) days.   No facility-administered encounter medications on file as of 09/18/2020.    Allergies (verified) Buprenorphine hcl and Morphine and related   History: Past Medical History:  Diagnosis Date   Alcohol abuse    Allergy    Arthritis    Back pain    Constipation 03/15/2020   Drug abuse (HCC)    GERD (gastroesophageal reflux disease)    Gout    Hernia    Hypercholesteremia    Hypertension    Joint pain    Obesity    Osteoarthritis    Prediabetes    Stomach ulcer    Swallowing difficulty    Wears dentures    bottom front   Past Surgical History:  Procedure Laterality Date   HERNIA REPAIR     twice   REPLACEMENT TOTAL KNEE     partial   SHOULDER SURGERY     right   TOTAL HIP ARTHROPLASTY Right 10/03/2019   Procedure: RIGHT TOTAL HIP ARTHROPLASTY ANTERIOR APPROACH;  Surgeon: Tarry Kos, MD;  Location: [facility];  Service: Orthopedics;  Laterality: Right;   Family History  Problem Relation Age of Onset   Healthy Mother    Healthy  Father    Cancer Brother        bone cancer   Cancer Maternal Aunt        breast cancer   Cancer Maternal Uncle        lukemia   Cancer Cousin        unknown   Cancer Maternal Uncle        lukemia   Cancer Paternal Uncle        prostate   Hearing loss Maternal Grandfather 75   Social History   Socioeconomic History   Marital status: Married    Spouse name: Not on file   Number of children: Not on file   Years of education: Not on file   Highest education level: Not on file  Occupational History   Not on file  Tobacco Use   Smoking status: Former    Types: Cigarettes    Quit date: 03/20/1998    Years since quitting: 22.5   Smokeless tobacco: Former    Types:  Associate Professor Use: Never used  Substance and Sexual Activity   Alcohol use: Not Currently   Drug use: No   Sexual activity: Yes    Birth control/protection: Condom  Other Topics Concern   Not on file  Social History Narrative   Not on file   Social Determinants of Health   Financial Resource Strain: Low Risk    Difficulty of Paying Living Expenses: Not hard at all  Food Insecurity: No Food Insecurity   Worried About Programme researcher, broadcasting/film/video in the Last Year: Never true   Ran Out of Food in the Last Year: Never true  Transportation Needs: No Transportation Needs   Lack of Transportation (Medical): No   Lack of Transportation (Non-Medical): No  Physical Activity: Sufficiently Active   Days of Exercise per Week: 4 days   Minutes of Exercise per Session: 50 min  Stress: No Stress Concern Present   Feeling of Stress : Not at all  Social Connections: Moderately Integrated   Frequency of Communication with Friends and Family: More than three times a week   Frequency of Social Gatherings with Friends and Family: More than three times a week   Attends Religious Services: More than 4 times per year   Active Member of [employer] or Organizations: No   Attends Engineer, structural: Never   Marital Status: Married    Tobacco Counseling Counseling given: Not Answered   Clinical Intake:  Pre-visit preparation completed: Yes  Pain : No/denies pain     Nutritional Risks: None Diabetes: No  How often do you need to have someone help you when you read instructions, pamphlets, or other written materials from your doctor or pharmacy?: 1 - Never  Diabetic?no  Interpreter Needed?: No  Information entered by :: Remi Haggard LPN   Activities of Daily Living In your present state of health, do you have any difficulty performing the following activities: 09/18/2020 10/03/2019  Hearing? N N  Vision? N N  Difficulty concentrating or making decisions? N N  Walking or  climbing stairs? N N  Dressing or bathing? N N  Doing errands, shopping? N N  Preparing Food and eating ? N -  Using the Toilet? N -  In the past six months, have you accidently leaked urine? N -  Do you have problems with loss of bowel control? N -  Managing your Medications? N -  Managing your Finances? N -  Housekeeping or managing your Housekeeping? N -  Some recent data might be hidden    Patient Care Team: Nche, Bonna Gains, NP as PCP - General (Internal Medicine)  Indicate any recent Medical Services you may have received from other than [facility] providers in the past year (date may be approximate).     Assessment:   This is a routine wellness examination for [place].  Hearing/Vision screen Hearing Screening - Comments:: No trouble hearing Vision Screening - Comments:: Up to date Dr. Nedra Hai  Dietary issues and exercise activities discussed: Current Exercise Habits: Structured exercise class;The patient has a physically strenuous job, but has no regular exercise apart from work., Type of exercise: walking, Time (Minutes): 40, Frequency (Times/Week): 5, Weekly Exercise (Minutes/Week): 200, Intensity: Moderate   Goals Addressed             This Visit's Progress    Patient Stated       Would like to loose 10 pounds       Depression Screen PHQ 2/9 Scores 09/18/2020 07/04/2020 08/10/2019 02/21/2019 02/24/2018 02/23/2017  PHQ - 2 Score 0 1 0 0 0 0  PHQ- 9 Score - 3 - - - -    Fall Risk Fall Risk  09/18/2020 05/10/2020 08/10/2019 02/21/2019 02/24/2018  Falls in the past year? 0 0 0 0 0  Number falls in past yr: 0 0 0 - -  Injury with Fall? 0 0 0 - -  Risk for fall due to : - - No Fall Risks - -  Follow up Falls evaluation completed;Falls prevention discussed - Falls prevention discussed - -    FALL RISK PREVENTION PERTAINING TO THE HOME:  Any stairs in or around the home? No  If so, are there any without handrails? No  Home free of loose throw rugs in walkways, pet beds,  electrical cords, etc? Yes  Adequate lighting in your home to reduce risk of falls? Yes   ASSISTIVE DEVICES UTILIZED TO PREVENT FALLS:  Life alert? No  Use of a cane, walker or w/c? No  Grab bars in the bathroom? Yes  Shower chair or bench in shower? No  Elevated toilet seat or a handicapped toilet? Yes   TIMED UP AND GO:  Was the test performed? No .    Cognitive Function:  Normal cognitive status assessed by direct observation by this Nurse Health Advisor. No abnormalities found.       6CIT Screen 08/10/2019  What Year? 0 points  What month? 0 points  What time? 0 points  Count back from 20 0 points  Months in reverse 0 points  Repeat phrase 0 points  Total Score 0    Immunizations Immunization History  Administered Date(s) Administered   Fluad Quad(high Dose 65+) 11/11/2019   Influenza, High Dose Seasonal PF 12/28/2016, 10/14/2018   Influenza,inj,Quad PF,6+ Mos 11/27/2016   Influenza-Unspecified 11/12/2015, 11/17/2017   Moderna Sars-Covid-2 Vaccination 04/01/2019, 04/29/2019   Pneumococcal Conjugate-13 02/24/2018   Pneumococcal Polysaccharide-23 10/14/2018   Tdap 08/24/2017   Zoster, Live 12/06/2013    TDAP status: Up to date  Flu Vaccine status: Up to date  Pneumococcal vaccine status: Up to date  Covid-19 vaccine status: Completed vaccines  Qualifies for Shingles Vaccine? Yes   Zostavax completed No   Shingrix Completed?: No.    Education has been provided regarding the importance of this vaccine. Patient has been advised to call insurance company to determine out of pocket expense if they have not yet received this vaccine. Advised  may also receive vaccine at local pharmacy or Health Dept. Verbalized acceptance and understanding.  Screening Tests Health Maintenance  Topic Date Due   Zoster Vaccines- Shingrix (1 of 2) Never done   COVID-19 Vaccine (3 - Booster for Moderna series) 09/29/2019   OPHTHALMOLOGY EXAM  08/14/2020   INFLUENZA VACCINE   09/17/2020   HEMOGLOBIN A1C  11/11/2020   FOOT EXAM  05/10/2021   URINE MICROALBUMIN  05/11/2021   COLONOSCOPY (Pts [id number] Insurance coverage will need to be confirmed)  07/29/2027   TETANUS/TDAP  08/25/2027   Hepatitis C Screening  Completed   PNA vac Low Risk Adult  Completed   HPV VACCINES  Aged Out    Health Maintenance  Health Maintenance Due  Topic Date Due   Zoster Vaccines- Shingrix (1 of 2) Never done   COVID-19 Vaccine (3 - Booster for Moderna series) 09/29/2019   OPHTHALMOLOGY EXAM  08/14/2020   INFLUENZA VACCINE  09/17/2020    Colorectal cancer screening: Type of screening: Colonoscopy. Completed  . Repeat every 10 years  Lung Cancer Screening: (Low Dose CT Chest recommended if Age 67-80 years, 30 pack-year currently smoking OR have quit w/in 15years.) does not qualify.   Lung Cancer Screening Referral: na  Additional Screening:  Hepatitis C Screening: does not qualify; Completed   Vision Screening: Recommended annual ophthalmology exams for early detection of glaucoma and other disorders of the eye. Is the patient up to date with their annual eye exam?  Yes  Who is the provider or what is the name of the office in which the patient attends annual eye exams? Dr Nedra HaiLee If pt is not established with a provider, would they like to be referred to a provider to establish care? No .   Dental Screening: Recommended annual dental exams for proper oral hygiene  Community Resource Referral / Chronic Care Management: CRR required this visit?  No   CCM required this visit?  No      Plan:     I have personally reviewed and noted the following in the patient's chart:   Medical and social history Use of alcohol, tobacco or illicit drugs  Current medications and supplements including opioid prescriptions. Patient is not currently taking opioid prescriptions. Functional ability and status Nutritional status Physical activity Advanced directives List of other  physicians Hospitalizations, surgeries, and ER visits in previous 12 months Vitals Screenings to include cognitive, depression, and falls Referrals and appointments  In addition, I have reviewed and discussed with patient certain preventive protocols, quality metrics, and best practice recommendations. A written personalized care plan for preventive services as well as general preventive health recommendations were provided to patient.     Remi HaggardJulie Murna Backer, LPN   1/6/10968/03/2020   Nurse Notes: na

## 2020-09-18 NOTE — Patient Instructions (Signed)
Marvin [name] , Thank you for taking time to come for your Medicare Wellness Visit. I appreciate your ongoing commitment to your health goals. Please review the following plan we discussed and let me know if I can assist you in the future.   Screening recommendations/referrals: Colonoscopy: Up to date Recommended yearly ophthalmology/optometry visit for glaucoma screening and checkup Recommended yearly dental visit for hygiene and checkup  Vaccinations: Influenza vaccine: up to date Pneumococcal vaccine: up to date Tdap vaccine: up to date Shingles vaccine: Education provided    Advanced directives: Education provided  Conditions/risks identified: na  Next appointment: 11-08-2020 @ 9:30 am  Preventive Care 65 Years and Older, Male Preventive care refers to lifestyle choices and visits with your health care provider that can promote health and wellness. What does preventive care include? A yearly physical exam. This is also called an annual well check. Dental exams once or twice a year. Routine eye exams. Ask your health care provider how often you should have your eyes checked. Personal lifestyle choices, including: Daily care of your teeth and gums. Regular physical activity. Eating a healthy diet. Avoiding tobacco and drug use. Limiting alcohol use. Practicing safe sex. Taking low doses of aspirin every day. Taking vitamin and mineral supplements as recommended by your health care provider. What happens during an annual well check? The services and screenings done by your health care provider during your annual well check will depend on your age, overall health, lifestyle risk factors, and family history of disease. Counseling  Your health care provider may ask you questions about your: Alcohol use. Tobacco use. Drug use. Emotional well-being. Home and relationship well-being. Sexual activity. Eating habits. History of falls. Memory and ability to understand  (cognition). Work and work Astronomer. Screening  You may have the following tests or measurements: Height, weight, and BMI. Blood pressure. Lipid and cholesterol levels. These may be checked every 5 years, or more frequently if you are over 70 years old. Skin check. Lung cancer screening. You may have this screening every year starting at age 73 if you have a 30-pack-year history of smoking and currently smoke or have quit within the past 15 years. Fecal occult blood test (FOBT) of the stool. You may have this test every year starting at age 92. Flexible sigmoidoscopy or colonoscopy. You may have a sigmoidoscopy every 5 years or a colonoscopy every 10 years starting at age 42. Prostate cancer screening. Recommendations will vary depending on your family history and other risks. Hepatitis C blood test. Hepatitis B blood test. Sexually transmitted disease (STD) testing. Diabetes screening. This is done by checking your blood sugar (glucose) after you have not eaten for a while (fasting). You may have this done every 1-3 years. Abdominal aortic aneurysm (AAA) screening. You may need this if you are a current or former smoker. Osteoporosis. You may be screened starting at age 72 if you are at high risk. Talk with your health care provider about your test results, treatment options, and if necessary, the need for more tests. Vaccines  Your health care provider may recommend certain vaccines, such as: Influenza vaccine. This is recommended every year. Tetanus, diphtheria, and acellular pertussis (Tdap, Td) vaccine. You may need a Td booster every 10 years. Zoster vaccine. You may need this after age 1. Pneumococcal 13-valent conjugate (PCV13) vaccine. One dose is recommended after age 44. Pneumococcal polysaccharide (PPSV23) vaccine. One dose is recommended after age 46. Talk to your health care provider about which screenings and vaccines  you need and how often you need them. This  information is not intended to replace advice given to you by your health care provider. Make sure you discuss any questions you have with your health care provider. Document Released: 03/02/2015 Document Revised: 10/24/2015 Document Reviewed: 12/05/2014 Elsevier Interactive Patient Education  2017 [place] Prevention in the Home Falls can cause injuries. They can happen to people of all ages. There are many things you can do to make your home safe and to help prevent falls. What can I do on the outside of my home? Regularly fix the edges of walkways and driveways and fix any cracks. Remove anything that might make you trip as you walk through a door, such as a raised step or threshold. Trim any bushes or trees on the path to your home. Use bright outdoor lighting. Clear any walking paths of anything that might make someone trip, such as rocks or tools. Regularly check to see if handrails are loose or broken. Make sure that both sides of any steps have handrails. Any raised decks and porches should have guardrails on the edges. Have any leaves, snow, or ice cleared regularly. Use sand or salt on walking paths during winter. Clean up any spills in your garage right away. This includes oil or grease spills. What can I do in the bathroom? Use night lights. Install grab bars by the toilet and in the tub and shower. Do not use towel bars as grab bars. Use non-skid mats or decals in the tub or shower. If you need to sit down in the shower, use a plastic, non-slip stool. Keep the floor dry. Clean up any water that spills on the floor as soon as it happens. Remove soap buildup in the tub or shower regularly. Attach bath mats securely with double-sided non-slip rug tape. Do not have throw rugs and other things on the floor that can make you trip. What can I do in the bedroom? Use night lights. Make sure that you have a light by your bed that is easy to reach. Do not use any sheets or  blankets that are too big for your bed. They should not hang down onto the floor. Have a firm chair that has side arms. You can use this for support while you get dressed. Do not have throw rugs and other things on the floor that can make you trip. What can I do in the kitchen? Clean up any spills right away. Avoid walking on wet floors. Keep items that you use a lot in easy-to-reach places. If you need to reach something above you, use a strong step stool that has a grab bar. Keep electrical cords out of the way. Do not use floor polish or wax that makes floors slippery. If you must use wax, use non-skid floor wax. Do not have throw rugs and other things on the floor that can make you trip. What can I do with my stairs? Do not leave any items on the stairs. Make sure that there are handrails on both sides of the stairs and use them. Fix handrails that are broken or loose. Make sure that handrails are as long as the stairways. Check any carpeting to make sure that it is firmly attached to the stairs. Fix any carpet that is loose or worn. Avoid having throw rugs at the top or bottom of the stairs. If you do have throw rugs, attach them to the floor with carpet tape. Make sure  that you have a light switch at the top of the stairs and the bottom of the stairs. If you do not have them, ask someone to add them for you. What else can I do to help prevent falls? Wear shoes that: Do not have high heels. Have rubber bottoms. Are comfortable and fit you well. Are closed at the toe. Do not wear sandals. If you use a stepladder: Make sure that it is fully opened. Do not climb a closed stepladder. Make sure that both sides of the stepladder are locked into place. Ask someone to hold it for you, if possible. Clearly mark and make sure that you can see: Any grab bars or handrails. First and last steps. Where the edge of each step is. Use tools that help you move around (mobility aids) if they are  needed. These include: Canes. Walkers. Scooters. Crutches. Turn on the lights when you go into a dark area. Replace any light bulbs as soon as they burn out. Set up your furniture so you have a clear path. Avoid moving your furniture around. If any of your floors are uneven, fix them. If there are any pets around you, be aware of where they are. Review your medicines with your doctor. Some medicines can make you feel dizzy. This can increase your chance of falling. Ask your doctor what other things that you can do to help prevent falls. This information is not intended to replace advice given to you by your health care provider. Make sure you discuss any questions you have with your health care provider. Document Released: 11/30/2008 Document Revised: 07/12/2015 Document Reviewed: 03/10/2014 Elsevier Interactive Patient Education  2017 [employer].

## 2020-09-21 DIAGNOSIS — M9901 Segmental and somatic dysfunction of cervical region: Secondary | ICD-10-CM | POA: Diagnosis not present

## 2020-09-21 DIAGNOSIS — M546 Pain in thoracic spine: Secondary | ICD-10-CM | POA: Diagnosis not present

## 2020-09-21 DIAGNOSIS — M50222 Other cervical disc displacement at C5-C6 level: Secondary | ICD-10-CM | POA: Diagnosis not present

## 2020-09-21 DIAGNOSIS — M542 Cervicalgia: Secondary | ICD-10-CM | POA: Diagnosis not present

## 2020-09-21 DIAGNOSIS — M5137 Other intervertebral disc degeneration, lumbosacral region: Secondary | ICD-10-CM | POA: Diagnosis not present

## 2020-09-21 DIAGNOSIS — M5459 Other low back pain: Secondary | ICD-10-CM | POA: Diagnosis not present

## 2020-09-21 DIAGNOSIS — M9902 Segmental and somatic dysfunction of thoracic region: Secondary | ICD-10-CM | POA: Diagnosis not present

## 2020-09-21 DIAGNOSIS — M9903 Segmental and somatic dysfunction of lumbar region: Secondary | ICD-10-CM | POA: Diagnosis not present

## 2020-09-23 ENCOUNTER — Other Ambulatory Visit: Payer: Self-pay | Admitting: Nurse Practitioner

## 2020-09-23 DIAGNOSIS — E790 Hyperuricemia without signs of inflammatory arthritis and tophaceous disease: Secondary | ICD-10-CM

## 2020-09-25 ENCOUNTER — Encounter (INDEPENDENT_AMBULATORY_CARE_PROVIDER_SITE_OTHER): Payer: Self-pay | Admitting: Bariatrics

## 2020-09-25 ENCOUNTER — Other Ambulatory Visit: Payer: Self-pay

## 2020-09-25 ENCOUNTER — Ambulatory Visit (INDEPENDENT_AMBULATORY_CARE_PROVIDER_SITE_OTHER): Payer: 59 | Admitting: Bariatrics

## 2020-09-25 VITALS — BP 126/75 | HR 57 | Temp 98.5°F | Ht 70.0 in | Wt 233.0 lb

## 2020-09-25 DIAGNOSIS — E1169 Type 2 diabetes mellitus with other specified complication: Secondary | ICD-10-CM | POA: Diagnosis not present

## 2020-09-25 DIAGNOSIS — E669 Obesity, unspecified: Secondary | ICD-10-CM | POA: Diagnosis not present

## 2020-09-25 DIAGNOSIS — Z6834 Body mass index (BMI) 34.0-34.9, adult: Secondary | ICD-10-CM | POA: Diagnosis not present

## 2020-09-25 DIAGNOSIS — E559 Vitamin D deficiency, unspecified: Secondary | ICD-10-CM

## 2020-09-25 DIAGNOSIS — E6609 Other obesity due to excess calories: Secondary | ICD-10-CM

## 2020-09-25 MED ORDER — INSULIN PEN NEEDLE 32G X 4 MM MISC: 0 refills | Status: DC

## 2020-09-25 MED ORDER — OZEMPIC (0.25 OR 0.5 MG/DOSE) 2 MG/1.5ML ~~LOC~~ SOPN: 0.5000 mg | PEN_INJECTOR | SUBCUTANEOUS | 0 refills | Status: DC

## 2020-09-25 MED ORDER — VITAMIN D (ERGOCALCIFEROL) 1.25 MG (50000 UNIT) PO CAPS: 50000.0000 [IU] | ORAL_CAPSULE | ORAL | 0 refills | Status: DC

## 2020-09-25 NOTE — Patient Instructions (Signed)
Health Maintenance Due  Topic Date Due   Zoster Vaccines- Shingrix (1 of 2) Never done   COVID-19 Vaccine (3 - Booster for Moderna series) 09/29/2019   OPHTHALMOLOGY EXAM  08/14/2020   INFLUENZA VACCINE  09/17/2020    Depression screen PHQ 2/9 09/18/2020 07/04/2020 08/10/2019  Decreased Interest 0 1 0  Down, Depressed, Hopeless 0 0 0  PHQ - 2 Score 0 1 0  Altered sleeping - 0 -  Tired, decreased energy - 1 -  Change in appetite - 1 -  Feeling bad or failure about yourself  - 0 -  Trouble concentrating - 0 -  Moving slowly or fidgety/restless - 0 -  Suicidal thoughts - 0 -  PHQ-9 Score - 3 -  Difficult doing work/chores - Somewhat difficult -

## 2020-09-25 NOTE — Telephone Encounter (Signed)
Chart supports rx refill Last ov: 09/18/2020 Last refill: 06/25/2020

## 2020-09-26 ENCOUNTER — Encounter (INDEPENDENT_AMBULATORY_CARE_PROVIDER_SITE_OTHER): Payer: Self-pay | Admitting: Bariatrics

## 2020-09-26 NOTE — Progress Notes (Signed)
Chief Complaint:   OBESITY Marvin [name] is here to discuss his progress with his obesity treatment plan along with follow-up of his obesity related diagnoses. Marvin [name] is on the Category 2 Plan and states he is following his eating plan approximately 80% of the time. Marvin [name] states he is walking at work for 5 hours 4 times per week.  Today's visit was #: 5 Starting weight: 242 lbs Starting date: 07/04/2020 Today's weight: 233 lbs Today's date: 09/25/2020 Total lbs lost to date: 9 lbs Total lbs lost since last in-office visit: 1 lb  Interim History: Marvin [name] is down 1 additional lb. He is keeping his protein high.  Subjective:   1. Vitamin D deficiency Marvin [name] denies muscle weakness. He has no nausea or vomiting.  2. Diabetes mellitus type 2 in obese Marvin [name]) Marvin [name] will decrease appetite. He denies side effects.  Assessment/Plan:   1. Vitamin D deficiency Low Vitamin D level contributes to fatigue and are associated with obesity, breast, and colon cancer. We will refill prescription Vitamin D 50,000 IU every week for 1 month with no refills and Marvin [name] will follow-up for routine testing of Vitamin D, at least 2-3 times per year to avoid over-replacement.  - Vitamin D, Ergocalciferol, (DRISDOL) 1.25 MG (50000 UNIT) CAPS capsule; Take 1 capsule (50,000 Units total) by mouth every 7 (seven) days.  Dispense: 12 capsule; Refill: 0  2. Diabetes mellitus type 2 in obese (HCC) We will refill Insulin Pen Needles 32.6 x 4 mm with no refills. We will refill Ozempic 0.5 mg with no refills. Good blood sugar control is important to decrease the likelihood of diabetic complications such as nephropathy, neuropathy, limb loss, blindness, coronary artery disease, and death. Intensive lifestyle modification including diet, exercise and weight loss are the first line of treatment for diabetes.   - Semaglutide,0.25 or 0.5MG /DOS, (OZEMPIC, 0.25 OR 0.5 MG/DOSE,) 2 MG/1.5ML SOPN; Inject 0.5 mg into the skin  once a week.  Dispense: 1.5 mL; Refill: 0 - Insulin Pen Needle 32G X 4 MM MISC; Use with the Ozempic weekly  Dispense: 50 each; Refill: 0  3. Obesity, current BMI 33.4 Marvin [name] is currently in the action stage of change. As such, his goal is to continue with weight loss efforts. He has agreed to the Category 2 Plan.   Marvin [name] will continue meal planning. He will adhere closely to the plan. He will increase H2O.  Exercise goals:  As is.  Behavioral modification strategies: increasing lean protein intake, decreasing simple carbohydrates, increasing vegetables, increasing water intake, decreasing eating out, no skipping meals, meal planning and cooking strategies, keeping healthy foods in the home, and planning for success.  Marvin [name] has agreed to follow-up with our clinic in 2-3 weeks. He was informed of the importance of frequent follow-up visits to maximize his success with intensive lifestyle modifications for his multiple health conditions.   Objective:   Blood pressure 126/75, pulse (!) 57, temperature 98.5 F (36.9 C), height 5\' 10"  (1.778 m), weight 233 lb (105.7 kg), SpO2 98 %. Body mass index is 33.43 kg/m.  General: Cooperative, alert, well developed, in no acute distress. HEENT: Conjunctivae and lids unremarkable. Cardiovascular: Regular rhythm.  Lungs: Normal work of breathing. Neurologic: No focal deficits.   Lab Results  Component Value Date   CREATININE 1.06 05/11/2020   BUN 13 05/11/2020   NA 141 05/11/2020   K 4.2 05/11/2020   CL 103 05/11/2020   CO2 30 05/11/2020   Lab Results  Component Value Date  ALT 15 05/11/2020   AST 17 05/11/2020   ALKPHOS 102 05/11/2020   BILITOT 0.7 05/11/2020   Lab Results  Component Value Date   HGBA1C 6.4 05/11/2020   HGBA1C 6.3 (H) 09/30/2019   HGBA1C 6.2 03/01/2019   HGBA1C 6.3 (H) 08/03/2018   HGBA1C 6.7 (H) 02/24/2018   Lab Results  Component Value Date   INSULIN 10.6 07/04/2020   Lab Results  Component Value Date    TSH 2.170 07/04/2020   Lab Results  Component Value Date   CHOL 199 05/11/2020   HDL 44.80 05/11/2020   LDLCALC 142 (H) 05/11/2020   TRIG 59.0 05/11/2020   CHOLHDL 4 05/11/2020   Lab Results  Component Value Date   VD25OH 7.3 (L) 07/04/2020   Lab Results  Component Value Date   WBC 15.4 (H) 10/04/2019   HGB 12.6 (L) 10/04/2019   HCT 37.8 (L) 10/04/2019   MCV 86.7 10/04/2019   PLT 182 10/04/2019   No results found for: IRON, TIBC, FERRITIN  Obesity Behavioral Intervention:   Approximately 15 minutes were spent on the discussion below.  ASK: We discussed the diagnosis of obesity with Marvin [name] today and Marvin [name] agreed to give [place] permission to discuss obesity behavioral modification therapy today.  ASSESS: Marvin [name] has the diagnosis of obesity and his BMI today is 33.4. Marvin [name] is in the action stage of change.   ADVISE: Marvin [name] was educated on the multiple health risks of obesity as well as the benefit of weight loss to improve his health. He was advised of the need for long term treatment and the importance of lifestyle modifications to improve his current health and to decrease his risk of future health problems.  AGREE: Multiple dietary modification options and treatment options were discussed and Marvin [name] agreed to follow the recommendations documented in the above note.  ARRANGE: Marvin [name] was educated on the importance of frequent visits to treat obesity as outlined per CMS and USPSTF guidelines and agreed to schedule his next follow up appointment today.  Attestation Statements:   Reviewed by clinician on day of visit: allergies, medications, problem list, medical history, surgical history, family history, social history, and previous encounter notes.  I, [name], RMA, am acting as Energy manager for [employer], DO.   I have reviewed the above documentation for accuracy and completeness, and I agree with the above. Corinna Capra, DO

## 2020-10-05 DIAGNOSIS — M546 Pain in thoracic spine: Secondary | ICD-10-CM | POA: Diagnosis not present

## 2020-10-05 DIAGNOSIS — M5137 Other intervertebral disc degeneration, lumbosacral region: Secondary | ICD-10-CM | POA: Diagnosis not present

## 2020-10-05 DIAGNOSIS — M9902 Segmental and somatic dysfunction of thoracic region: Secondary | ICD-10-CM | POA: Diagnosis not present

## 2020-10-05 DIAGNOSIS — M50222 Other cervical disc displacement at C5-C6 level: Secondary | ICD-10-CM | POA: Diagnosis not present

## 2020-10-05 DIAGNOSIS — M542 Cervicalgia: Secondary | ICD-10-CM | POA: Diagnosis not present

## 2020-10-05 DIAGNOSIS — M9903 Segmental and somatic dysfunction of lumbar region: Secondary | ICD-10-CM | POA: Diagnosis not present

## 2020-10-05 DIAGNOSIS — M5459 Other low back pain: Secondary | ICD-10-CM | POA: Diagnosis not present

## 2020-10-05 DIAGNOSIS — M9901 Segmental and somatic dysfunction of cervical region: Secondary | ICD-10-CM | POA: Diagnosis not present

## 2020-10-09 ENCOUNTER — Other Ambulatory Visit: Payer: Self-pay

## 2020-10-09 ENCOUNTER — Encounter (INDEPENDENT_AMBULATORY_CARE_PROVIDER_SITE_OTHER): Payer: Self-pay | Admitting: Bariatrics

## 2020-10-09 ENCOUNTER — Other Ambulatory Visit: Payer: Self-pay | Admitting: Nurse Practitioner

## 2020-10-09 ENCOUNTER — Other Ambulatory Visit (INDEPENDENT_AMBULATORY_CARE_PROVIDER_SITE_OTHER): Payer: Self-pay | Admitting: Bariatrics

## 2020-10-09 ENCOUNTER — Ambulatory Visit (INDEPENDENT_AMBULATORY_CARE_PROVIDER_SITE_OTHER): Payer: 59 | Admitting: Bariatrics

## 2020-10-09 VITALS — BP 131/71 | HR 66 | Temp 98.3°F | Ht 70.0 in | Wt 231.0 lb

## 2020-10-09 DIAGNOSIS — E1159 Type 2 diabetes mellitus with other circulatory complications: Secondary | ICD-10-CM

## 2020-10-09 DIAGNOSIS — J301 Allergic rhinitis due to pollen: Secondary | ICD-10-CM

## 2020-10-09 DIAGNOSIS — E1169 Type 2 diabetes mellitus with other specified complication: Secondary | ICD-10-CM

## 2020-10-09 DIAGNOSIS — Z6834 Body mass index (BMI) 34.0-34.9, adult: Secondary | ICD-10-CM | POA: Diagnosis not present

## 2020-10-09 DIAGNOSIS — E669 Obesity, unspecified: Secondary | ICD-10-CM

## 2020-10-09 DIAGNOSIS — I152 Hypertension secondary to endocrine disorders: Secondary | ICD-10-CM | POA: Diagnosis not present

## 2020-10-09 DIAGNOSIS — E6609 Other obesity due to excess calories: Secondary | ICD-10-CM | POA: Diagnosis not present

## 2020-10-09 NOTE — Telephone Encounter (Signed)
Chart supports Rx 

## 2020-10-09 NOTE — Telephone Encounter (Signed)
Pt last seen by Dr. Brown.  

## 2020-10-10 ENCOUNTER — Encounter (INDEPENDENT_AMBULATORY_CARE_PROVIDER_SITE_OTHER): Payer: Self-pay | Admitting: Bariatrics

## 2020-10-10 NOTE — Progress Notes (Signed)
Chief Complaint:   OBESITY Marvin [name] is here to discuss his progress with his obesity treatment plan along with follow-up of his obesity related diagnoses. Marvin [name] is on the Category 2 Plan and states he is following his eating plan approximately 80% of the time. Marvin [name] states he is doing 0 minutes 0 times per week.  Today's visit was #: 6 Starting weight: 242 lbs Starting date: 07/04/2020 Today's weight: 231 lbs Today's date: 10/09/2020 Total lbs lost to date: 11 lbs Total lbs lost since last in-office visit: 2 lbs  Interim History: Marvin [name] is down another 2 lbs. He is getting in adequate protein and water.  Subjective:   1. Hypertension associated with diabetes [facility]) Marvin [name] is currently taking Norvasc. His hypertension is controlled.  2. Diabetes mellitus type 2 in obese [facility]) Marvin [name] is taking Ozempic. He denies side effects.  Assessment/Plan:   1. Hypertension associated with diabetes [facility]) Marvin [name] will continue taking Norvasc. He is working on healthy weight loss and exercise to improve blood pressure control. We will watch for signs of hypotension as he continues his lifestyle modifications.   2. Diabetes mellitus type 2 in obese [facility]) Marvin [name] will continue taking his medication. Good blood sugar control is important to decrease the likelihood of diabetic complications such as nephropathy, neuropathy, limb loss, blindness, coronary artery disease, and death. Intensive lifestyle modification including diet, exercise and weight loss are the first line of treatment for diabetes.    3. Obesity, current BMI 33.2 Marvin [name] is currently in the action stage of change. As such, his goal is to continue with weight loss efforts. He has agreed to the Category 2 Plan.   Marvin [name] will continue meal planning. He will continue intentional eating. He will decrease protein sizes.  Exercise goals:  Marvin [name] started Marvin [name] yesterday.  Behavioral modification strategies: increasing lean  protein intake, decreasing simple carbohydrates, increasing vegetables, increasing water intake, decreasing eating out, no skipping meals, meal planning and cooking strategies, keeping healthy foods in the home, and planning for success.  Marvin [name] has agreed to follow-up with our clinic in 2 weeks (fasting). He was informed of the importance of frequent follow-up visits to maximize his success with intensive lifestyle modifications for his multiple health conditions.   Objective:   Blood pressure 131/71, pulse 66, temperature 98.3 F (36.8 C), height 5\' 10"  (1.778 m), weight 231 lb (104.8 kg), SpO2 99 %. Body mass index is 33.15 kg/m.  General: Cooperative, alert, well developed, in no acute distress. HEENT: Conjunctivae and lids unremarkable. Cardiovascular: Regular rhythm.  Lungs: Normal work of breathing. Neurologic: No focal deficits.   Lab Results  Component Value Date   CREATININE 1.06 05/11/2020   BUN 13 05/11/2020   NA 141 05/11/2020   K 4.2 05/11/2020   CL 103 05/11/2020   CO2 30 05/11/2020   Lab Results  Component Value Date   ALT 15 05/11/2020   AST 17 05/11/2020   ALKPHOS 102 05/11/2020   BILITOT 0.7 05/11/2020   Lab Results  Component Value Date   HGBA1C 6.4 05/11/2020   HGBA1C 6.3 (H) 09/30/2019   HGBA1C 6.2 03/01/2019   HGBA1C 6.3 (H) 08/03/2018   HGBA1C 6.7 (H) 02/24/2018   Lab Results  Component Value Date   INSULIN 10.6 07/04/2020   Lab Results  Component Value Date   TSH 2.170 07/04/2020   Lab Results  Component Value Date   CHOL 199 05/11/2020   HDL 44.80 05/11/2020   LDLCALC 142 (H) 05/11/2020   TRIG  59.0 05/11/2020   CHOLHDL 4 05/11/2020   Lab Results  Component Value Date   VD25OH 7.3 (L) 07/04/2020   Lab Results  Component Value Date   WBC 15.4 (H) 10/04/2019   HGB 12.6 (L) 10/04/2019   HCT 37.8 (L) 10/04/2019   MCV 86.7 10/04/2019   PLT 182 10/04/2019   No results found for: IRON, TIBC, FERRITIN  Obesity Behavioral  Intervention:   Approximately 15 minutes were spent on the discussion below.  ASK: We discussed the diagnosis of obesity with Marvin [name] today and Marvin [name] agreed to give [place] permission to discuss obesity behavioral modification therapy today.  ASSESS: Marvin [name] has the diagnosis of obesity and his BMI today is 33.2. Marvin [name] is in the action stage of change.   ADVISE: Marvin [name] was educated on the multiple health risks of obesity as well as the benefit of weight loss to improve his health. He was advised of the need for long term treatment and the importance of lifestyle modifications to improve his current health and to decrease his risk of future health problems.  AGREE: Multiple dietary modification options and treatment options were discussed and Marvin [name] agreed to follow the recommendations documented in the above note.  ARRANGE: Marvin [name] was educated on the importance of frequent visits to treat obesity as outlined per CMS and USPSTF guidelines and agreed to schedule his next follow up appointment today.  Attestation Statements:   Reviewed by clinician on day of visit: allergies, medications, problem list, medical history, surgical history, family history, social history, and previous encounter notes.  I, [name], RMA, am acting as Energy manager for [employer], DO.   I have reviewed the above documentation for accuracy and completeness, and I agree with the above. Corinna Capra, DO

## 2020-10-19 DIAGNOSIS — M9901 Segmental and somatic dysfunction of cervical region: Secondary | ICD-10-CM | POA: Diagnosis not present

## 2020-10-19 DIAGNOSIS — M50222 Other cervical disc displacement at C5-C6 level: Secondary | ICD-10-CM | POA: Diagnosis not present

## 2020-10-19 DIAGNOSIS — M9902 Segmental and somatic dysfunction of thoracic region: Secondary | ICD-10-CM | POA: Diagnosis not present

## 2020-10-19 DIAGNOSIS — M5459 Other low back pain: Secondary | ICD-10-CM | POA: Diagnosis not present

## 2020-10-19 DIAGNOSIS — M546 Pain in thoracic spine: Secondary | ICD-10-CM | POA: Diagnosis not present

## 2020-10-19 DIAGNOSIS — M9903 Segmental and somatic dysfunction of lumbar region: Secondary | ICD-10-CM | POA: Diagnosis not present

## 2020-10-19 DIAGNOSIS — M542 Cervicalgia: Secondary | ICD-10-CM | POA: Diagnosis not present

## 2020-10-19 DIAGNOSIS — M5137 Other intervertebral disc degeneration, lumbosacral region: Secondary | ICD-10-CM | POA: Diagnosis not present

## 2020-10-23 ENCOUNTER — Other Ambulatory Visit: Payer: Self-pay

## 2020-10-23 ENCOUNTER — Encounter (INDEPENDENT_AMBULATORY_CARE_PROVIDER_SITE_OTHER): Payer: Self-pay | Admitting: Bariatrics

## 2020-10-23 ENCOUNTER — Ambulatory Visit (INDEPENDENT_AMBULATORY_CARE_PROVIDER_SITE_OTHER): Payer: 59 | Admitting: Bariatrics

## 2020-10-23 VITALS — BP 130/82 | HR 66 | Temp 97.9°F | Ht 70.0 in | Wt 229.0 lb

## 2020-10-23 DIAGNOSIS — E1159 Type 2 diabetes mellitus with other circulatory complications: Secondary | ICD-10-CM | POA: Diagnosis not present

## 2020-10-23 DIAGNOSIS — E1169 Type 2 diabetes mellitus with other specified complication: Secondary | ICD-10-CM

## 2020-10-23 DIAGNOSIS — I152 Hypertension secondary to endocrine disorders: Secondary | ICD-10-CM | POA: Diagnosis not present

## 2020-10-23 DIAGNOSIS — E785 Hyperlipidemia, unspecified: Secondary | ICD-10-CM

## 2020-10-23 DIAGNOSIS — E559 Vitamin D deficiency, unspecified: Secondary | ICD-10-CM | POA: Diagnosis not present

## 2020-10-23 DIAGNOSIS — E669 Obesity, unspecified: Secondary | ICD-10-CM | POA: Diagnosis not present

## 2020-10-23 DIAGNOSIS — Z9189 Other specified personal risk factors, not elsewhere classified: Secondary | ICD-10-CM

## 2020-10-23 DIAGNOSIS — E6609 Other obesity due to excess calories: Secondary | ICD-10-CM | POA: Diagnosis not present

## 2020-10-23 DIAGNOSIS — E66811 Obesity, class 1: Secondary | ICD-10-CM

## 2020-10-23 DIAGNOSIS — Z6834 Body mass index (BMI) 34.0-34.9, adult: Secondary | ICD-10-CM | POA: Diagnosis not present

## 2020-10-23 NOTE — Progress Notes (Signed)
Chief Complaint:   OBESITY Param is here to discuss his progress with his obesity treatment plan along with follow-up of his obesity related diagnoses. Marvin [name] is on the Category 2 Plan and states he is following his eating plan approximately 80% of the time. Marvin [name] states he is doing cardio and weights for 60 minutes 3 times per week.  Today's visit was #: 7 Starting weight: 242 lbs Starting date: 07/04/2020 Today's weight: 229 lbs Today's date: 10/23/2020 Total lbs lost to date: 13 lbs Total lbs lost since last in-office visit: 2 lbs  Interim History: Marvin [name] is down an additional 2 lbs since his last visit.   Subjective:   1. Diabetes mellitus type 2 in obese [facility]) Marvin [name] is currently taking Ozempic.  2. Hypertension associated with diabetes (HCC) Review: taking medications as instructed, no medication side effects noted, no chest pain on exertion, no dyspnea on exertion, no swelling of ankles. Steele' hypertension is reasonably  well controlled.  3. Vitamin D deficiency Marvin [name] is currently taking high dose Vitamin D.  4. Hyperlipidemia associated with type 2 diabetes mellitus (HCC) Marvin [name] is currently taking Zocor.  5. At risk for heart disease Marvin [name] is at risk for heart disease due to diabetes mellitus, obesity, HTN and HLD.  Assessment/Plan:   1. Diabetes mellitus type 2 in obese (HCC) Good blood sugar control is important to decrease the likelihood of diabetic complications such as nephropathy, neuropathy, limb loss, blindness, coronary artery disease, and death. Intensive lifestyle modification including diet, exercise and weight loss are the first line of treatment for diabetes. We will check labs today.  - Hemoglobin A1c - Insulin, random - Microalbumin / creatinine urine ratio  2. Hypertension associated with diabetes (HCC) Marvin [name] is working on healthy weight loss and exercise to improve blood pressure control. We will watch for signs of  hypotension as he continues his lifestyle modifications. We will check CMP today.  - Comprehensive metabolic panel  3. Vitamin D deficiency Low Vitamin D level contributes to fatigue and are associated with obesity, breast, and colon cancer. Marvin [name] agrees to continue to take prescription Vitamin D 50,000 IU every week and he will follow-up for routine testing of Vitamin D, at least 2-3 times per year to avoid over-replacement.We will check Vitamin D today.  - VITAMIN D 25 Hydroxy (Vit-D Deficiency, Fractures)  4. Hyperlipidemia associated with type 2 diabetes mellitus (HCC) Cardiovascular risk and specific lipid/LDL goals reviewed.  We discussed several lifestyle modifications today and Marvin [name] will continue to work on diet, exercise and weight loss efforts. Becker will continue taking Zocor. We will check Lipid panel today. Orders and follow up as documented in patient record.   Counseling Intensive lifestyle modifications are the first line treatment for this issue. Dietary changes: Increase soluble fiber. Decrease simple carbohydrates. Exercise changes: Moderate to vigorous-intensity aerobic activity 150 minutes per week if tolerated. Lipid-lowering medications: see documented in medical record.  - Lipid Panel With LDL/HDL Ratio  5. At risk for heart disease Marvin [name] was given approximately 15 minutes of coronary artery disease prevention counseling today. He is 70 y.o. male and has risk factors for heart disease including obesity. We discussed intensive lifestyle modifications today with an emphasis on specific weight loss instructions and strategies.   Repetitive spaced learning was employed today to elicit superior memory formation and behavioral change.   6. Obesity, current BMI 32.9 Devone is currently in the action stage of change. As such, his goal is to continue with weight loss  efforts. He has agreed to the Category 2 Plan.   Marvin [name] will continue meal planning. He will  continue to be adherent to the plan (80-85%). He will decrease carbohydrates.  Exercise goals:  Norrin will go to [facility] for exercise.  Behavioral modification strategies: increasing lean protein intake, decreasing simple carbohydrates, increasing vegetables, increasing water intake, decreasing eating out, no skipping meals, meal planning and cooking strategies, keeping healthy foods in the home, and planning for success.  Marvin [name] has agreed to follow-up with our clinic in 2-3 weeks. He was informed of the importance of frequent follow-up visits to maximize his success with intensive lifestyle modifications for his multiple health conditions.   Marvin [name] was informed we would discuss his lab results at his next visit unless there is a critical issue that needs to be addressed sooner. Marvin [name] agreed to keep his next visit at the agreed upon time to discuss these results.  Objective:   Blood pressure 130/82, pulse 66, temperature 97.9 F (36.6 C), height 5\' 10"  (1.778 m), weight 229 lb (103.9 kg), SpO2 98 %. Body mass index is 32.86 kg/m.  General: Cooperative, alert, well developed, in no acute distress. HEENT: Conjunctivae and lids unremarkable. Cardiovascular: Regular rhythm.  Lungs: Normal work of breathing. Neurologic: No focal deficits.   Lab Results  Component Value Date   CREATININE 1.06 05/11/2020   BUN 13 05/11/2020   NA 141 05/11/2020   K 4.2 05/11/2020   CL 103 05/11/2020   CO2 30 05/11/2020   Lab Results  Component Value Date   ALT 15 05/11/2020   AST 17 05/11/2020   ALKPHOS 102 05/11/2020   BILITOT 0.7 05/11/2020   Lab Results  Component Value Date   HGBA1C 6.4 05/11/2020   HGBA1C 6.3 (H) 09/30/2019   HGBA1C 6.2 03/01/2019   HGBA1C 6.3 (H) 08/03/2018   HGBA1C 6.7 (H) 02/24/2018   Lab Results  Component Value Date   INSULIN 10.6 07/04/2020   Lab Results  Component Value Date   TSH 2.170 07/04/2020   Lab Results  Component Value Date   CHOL 199  05/11/2020   HDL 44.80 05/11/2020   LDLCALC 142 (H) 05/11/2020   TRIG 59.0 05/11/2020   CHOLHDL 4 05/11/2020   Lab Results  Component Value Date   VD25OH 7.3 (L) 07/04/2020   Lab Results  Component Value Date   WBC 15.4 (H) 10/04/2019   HGB 12.6 (L) 10/04/2019   HCT 37.8 (L) 10/04/2019   MCV 86.7 10/04/2019   PLT 182 10/04/2019   No results found for: IRON, TIBC, FERRITIN  Attestation Statements:   Reviewed by clinician on day of visit: allergies, medications, problem list, medical history, surgical history, family history, social history, and previous encounter notes.  I, 10/06/2019, RMA, am acting as [name] for Energy manager, DO.   I have reviewed the above documentation for accuracy and completeness, and I agree with the above. [employer], DO

## 2020-10-24 ENCOUNTER — Encounter (INDEPENDENT_AMBULATORY_CARE_PROVIDER_SITE_OTHER): Payer: Self-pay | Admitting: Bariatrics

## 2020-10-24 ENCOUNTER — Other Ambulatory Visit (INDEPENDENT_AMBULATORY_CARE_PROVIDER_SITE_OTHER): Payer: Self-pay | Admitting: Bariatrics

## 2020-10-24 DIAGNOSIS — E1169 Type 2 diabetes mellitus with other specified complication: Secondary | ICD-10-CM

## 2020-10-24 DIAGNOSIS — E669 Obesity, unspecified: Secondary | ICD-10-CM

## 2020-10-24 LAB — COMPREHENSIVE METABOLIC PANEL
ALT: 14 IU/L (ref 0–44)
AST: 16 IU/L (ref 0–40)
Albumin/Globulin Ratio: 1.8 (ref 1.2–2.2)
Albumin: 4.7 g/dL (ref 3.8–4.8)
Alkaline Phosphatase: 104 IU/L (ref 44–121)
BUN/Creatinine Ratio: 12 (ref 10–24)
BUN: 12 mg/dL (ref 8–27)
Bilirubin Total: 0.3 mg/dL (ref 0.0–1.2)
CO2: 23 mmol/L (ref 20–29)
Calcium: 9.3 mg/dL (ref 8.6–10.2)
Chloride: 106 mmol/L (ref 96–106)
Creatinine, Ser: 1 mg/dL (ref 0.76–1.27)
Globulin, Total: 2.6 g/dL (ref 1.5–4.5)
Glucose: 91 mg/dL (ref 65–99)
Potassium: 4.7 mmol/L (ref 3.5–5.2)
Sodium: 142 mmol/L (ref 134–144)
Total Protein: 7.3 g/dL (ref 6.0–8.5)
eGFR: 82 mL/min/{1.73_m2} (ref >59)

## 2020-10-24 LAB — INSULIN, RANDOM: INSULIN: 11.7 u[IU]/mL (ref 2.6–24.9)

## 2020-10-24 LAB — MICROALBUMIN / CREATININE URINE RATIO
Creatinine, Urine: 126.4 mg/dL
Microalb/Creat Ratio: 3 mg/g creat (ref 0–29)
Microalbumin, Urine: 3.5 ug/mL

## 2020-10-24 LAB — HEMOGLOBIN A1C
Est. average glucose Bld gHb Est-mCnc: 123 mg/dL
Hgb A1c MFr Bld: 5.9 % — ABNORMAL HIGH (ref 4.8–5.6)

## 2020-10-24 LAB — LIPID PANEL WITH LDL/HDL RATIO
Cholesterol, Total: 170 mg/dL (ref 100–199)
HDL: 40 mg/dL (ref >39)
LDL Chol Calc (NIH): 117 mg/dL — ABNORMAL HIGH (ref 0–99)
LDL/HDL Ratio: 2.9 ratio (ref 0.0–3.6)
Triglycerides: 67 mg/dL (ref 0–149)
VLDL Cholesterol Cal: 13 mg/dL (ref 5–40)

## 2020-10-24 LAB — VITAMIN D 25 HYDROXY (VIT D DEFICIENCY, FRACTURES): Vit D, 25-Hydroxy: 42.4 ng/mL (ref 30.0–100.0)

## 2020-10-24 NOTE — Telephone Encounter (Signed)
Pt last seen by Dr. Brown.  

## 2020-11-05 ENCOUNTER — Other Ambulatory Visit (INDEPENDENT_AMBULATORY_CARE_PROVIDER_SITE_OTHER): Payer: Self-pay | Admitting: Bariatrics

## 2020-11-05 DIAGNOSIS — E1169 Type 2 diabetes mellitus with other specified complication: Secondary | ICD-10-CM

## 2020-11-05 DIAGNOSIS — E669 Obesity, unspecified: Secondary | ICD-10-CM

## 2020-11-05 NOTE — Telephone Encounter (Signed)
Dr.Brown 

## 2020-11-07 ENCOUNTER — Ambulatory Visit (INDEPENDENT_AMBULATORY_CARE_PROVIDER_SITE_OTHER): Payer: 59 | Admitting: Bariatrics

## 2020-11-07 ENCOUNTER — Other Ambulatory Visit: Payer: Self-pay

## 2020-11-07 ENCOUNTER — Encounter (INDEPENDENT_AMBULATORY_CARE_PROVIDER_SITE_OTHER): Payer: Self-pay | Admitting: Bariatrics

## 2020-11-07 VITALS — BP 128/86 | HR 66 | Temp 98.1°F | Ht 70.0 in | Wt 228.0 lb

## 2020-11-07 DIAGNOSIS — E559 Vitamin D deficiency, unspecified: Secondary | ICD-10-CM | POA: Diagnosis not present

## 2020-11-07 DIAGNOSIS — Z6834 Body mass index (BMI) 34.0-34.9, adult: Secondary | ICD-10-CM | POA: Diagnosis not present

## 2020-11-07 DIAGNOSIS — E6609 Other obesity due to excess calories: Secondary | ICD-10-CM | POA: Diagnosis not present

## 2020-11-07 DIAGNOSIS — E669 Obesity, unspecified: Secondary | ICD-10-CM | POA: Diagnosis not present

## 2020-11-07 DIAGNOSIS — E1169 Type 2 diabetes mellitus with other specified complication: Secondary | ICD-10-CM | POA: Diagnosis not present

## 2020-11-07 MED ORDER — OZEMPIC (0.25 OR 0.5 MG/DOSE) 2 MG/1.5ML ~~LOC~~ SOPN: 0.5000 mg | PEN_INJECTOR | SUBCUTANEOUS | 0 refills | Status: DC

## 2020-11-07 MED ORDER — VITAMIN D (ERGOCALCIFEROL) 1.25 MG (50000 UNIT) PO CAPS: 50000.0000 [IU] | ORAL_CAPSULE | ORAL | 0 refills | Status: DC

## 2020-11-07 NOTE — Progress Notes (Signed)
Chief Complaint:   OBESITY Marvin [name] is here to discuss his progress with his obesity treatment plan along with follow-up of his obesity related diagnoses. Marvin [name] is on the Category 2 Plan and states he is following his eating plan approximately 80% of the time. Marvin [name] states he is doing cardio and strength for 60 minutes 4 times per week.  Today's visit was #: 8 Starting weight: 242 lbs Starting date: 07/04/2020 Today's weight: 228 lbs Today's date: 11/07/2020 Total lbs lost to date: 14 lbs Total lbs lost since last in-office visit: 1 lb  Interim History: Marvin [name] is down 1 lb and had done well overall.  Subjective:   1. Vitamin D deficiency Marvin [name] is currently taking Vitamin D.  2. Diabetes mellitus type 2 in obese [facility]) Marvin [name] is currently taking Ozempic. He notes side effect of constipation.  Assessment/Plan:   1. Vitamin D deficiency Low Vitamin D level contributes to fatigue and are associated with obesity, breast, and colon cancer. We will refill prescription Vitamin D 50,000 IU every week for 1 month with no refills and Rakin will follow-up for routine testing of Vitamin D, at least 2-3 times per year to avoid over-replacement.  - Vitamin D, Ergocalciferol, (DRISDOL) 1.25 MG (50000 UNIT) CAPS capsule; Take 1 capsule (50,000 Units total) by mouth every 7 (seven) days.  Dispense: 12 capsule; Refill: 0  2. Diabetes mellitus type 2 in obese [facility]) Marvin [name] will continue Ozempic . He will continue Ozempic 0.5 mg into skin weekly with no refills. He will take a suppositories. He will increase water and fiber. He will take Miralax, Citrucel with water and coffee. Good blood sugar control is important to decrease the likelihood of diabetic complications such as nephropathy, neuropathy, limb loss, blindness, coronary artery disease, and death. Intensive lifestyle modification including diet, exercise and weight loss are the first line of treatment for diabetes.   -  Semaglutide,0.25 or 0.5MG /DOS, (OZEMPIC, 0.25 OR 0.5 MG/DOSE,) 2 MG/1.5ML SOPN; Inject 0.5 mg into the skin once a week.  Dispense: 1.5 mL; Refill: 0  3. Obesity, current BMI 32.8 Marvin [name] is currently in the action stage of change. As such, his goal is to continue with weight loss efforts. He has agreed to the Category 2 Plan.   Exercise goals: Marvin [name] will continue exercising. Chest pressures, swimming and fishing.  Behavioral modification strategies: increasing lean protein intake, decreasing simple carbohydrates, increasing vegetables, increasing water intake, decreasing eating out, no skipping meals, meal planning and cooking strategies, keeping healthy foods in the home, and planning for success.  Marvin [name] has agreed to follow-up with our clinic in 3 weeks. He was informed of the importance of frequent follow-up visits to maximize his success with intensive lifestyle modifications for his multiple health conditions.   Objective:   Blood pressure 128/86, pulse 66, temperature 98.1 F (36.7 C), height 5\' 10"  (1.778 m), weight 228 lb (103.4 kg), SpO2 100 %. Body mass index is 32.71 kg/m.  General: Cooperative, alert, well developed, in no acute distress. HEENT: Conjunctivae and lids unremarkable. Cardiovascular: Regular rhythm.  Lungs: Normal work of breathing. Neurologic: No focal deficits.   Lab Results  Component Value Date   CREATININE 1.00 10/23/2020   BUN 12 10/23/2020   NA 142 10/23/2020   K 4.7 10/23/2020   CL 106 10/23/2020   CO2 23 10/23/2020   Lab Results  Component Value Date   ALT 14 10/23/2020   AST 16 10/23/2020   ALKPHOS 104 10/23/2020   BILITOT 0.3 10/23/2020  Lab Results  Component Value Date   HGBA1C 5.9 (H) 10/23/2020   HGBA1C 6.4 05/11/2020   HGBA1C 6.3 (H) 09/30/2019   HGBA1C 6.2 03/01/2019   HGBA1C 6.3 (H) 08/03/2018   Lab Results  Component Value Date   INSULIN 11.7 10/23/2020   INSULIN 10.6 07/04/2020   Lab Results  Component Value Date    TSH 2.170 07/04/2020   Lab Results  Component Value Date   CHOL 170 10/23/2020   HDL 40 10/23/2020   LDLCALC 117 (H) 10/23/2020   TRIG 67 10/23/2020   CHOLHDL 4 05/11/2020   Lab Results  Component Value Date   VD25OH 42.4 10/23/2020   VD25OH 7.3 (L) 07/04/2020   Lab Results  Component Value Date   WBC 15.4 (H) 10/04/2019   HGB 12.6 (L) 10/04/2019   HCT 37.8 (L) 10/04/2019   MCV 86.7 10/04/2019   PLT 182 10/04/2019   No results found for: IRON, TIBC, FERRITIN  Obesity Behavioral Intervention:   Approximately 15 minutes were spent on the discussion below.  ASK: We discussed the diagnosis of obesity with Marvin [name] today and Marvin [name] agreed to give [place] permission to discuss obesity behavioral modification therapy today.  ASSESS: Marvin [name] has the diagnosis of obesity and his BMI today is 32.8. Marvin [name] is in the action stage of change.   ADVISE: Marvin [name] was educated on the multiple health risks of obesity as well as the benefit of weight loss to improve his health. He was advised of the need for long term treatment and the importance of lifestyle modifications to improve his current health and to decrease his risk of future health problems.  AGREE: Multiple dietary modification options and treatment options were discussed and Marvin [name] agreed to follow the recommendations documented in the above note.  ARRANGE: Marvin [name] was educated on the importance of frequent visits to treat obesity as outlined per CMS and USPSTF guidelines and agreed to schedule his next follow up appointment today.  Attestation Statements:   Reviewed by clinician on day of visit: allergies, medications, problem list, medical history, surgical history, family history, social history, and previous encounter notes.  I, [name], RMA, am acting as Energy manager for [employer], DO.   I have reviewed the above documentation for accuracy and completeness, and I agree with the above. Corinna Capra,  DO

## 2020-11-08 ENCOUNTER — Ambulatory Visit (INDEPENDENT_AMBULATORY_CARE_PROVIDER_SITE_OTHER): Payer: 59 | Admitting: Nurse Practitioner

## 2020-11-08 ENCOUNTER — Encounter: Payer: Self-pay | Admitting: Nurse Practitioner

## 2020-11-08 VITALS — BP 102/60 | HR 66 | Temp 98.2°F | Ht 70.0 in | Wt 233.8 lb

## 2020-11-08 DIAGNOSIS — E119 Type 2 diabetes mellitus without complications: Secondary | ICD-10-CM | POA: Diagnosis not present

## 2020-11-08 DIAGNOSIS — E782 Mixed hyperlipidemia: Secondary | ICD-10-CM

## 2020-11-08 DIAGNOSIS — E790 Hyperuricemia without signs of inflammatory arthritis and tophaceous disease: Secondary | ICD-10-CM | POA: Diagnosis not present

## 2020-11-08 DIAGNOSIS — K4030 Unilateral inguinal hernia, with obstruction, without gangrene, not specified as recurrent: Secondary | ICD-10-CM | POA: Diagnosis not present

## 2020-11-08 DIAGNOSIS — I10 Essential (primary) hypertension: Secondary | ICD-10-CM

## 2020-11-08 DIAGNOSIS — Z125 Encounter for screening for malignant neoplasm of prostate: Secondary | ICD-10-CM | POA: Diagnosis not present

## 2020-11-08 MED ORDER — SIMVASTATIN 20 MG PO TABS: 20.0000 mg | ORAL_TABLET | ORAL | 3 refills | Status: DC

## 2020-11-08 MED ORDER — AMLODIPINE BESYLATE 10 MG PO TABS: 10.0000 mg | ORAL_TABLET | Freq: Every day | ORAL | 3 refills | Status: DC

## 2020-11-08 NOTE — Patient Instructions (Addendum)
Ok to continue metamucil daily. Ok to use senna tabs or smooth move tea at bedtime as needed.  Bring copy of advanced directive  Get shingrix vaccine from retail pharmacy  Have CK, uric acid and PSA drawn during your next blood draw with Dr. Wynona Neat will be contacted to schedule appt for pelvic US.  Increase zocor to 3x/week, since LDL is not at goal  Preventive Care 68 Years and Older, Male Preventive care refers to lifestyle choices and visits with your health care provider that can promote health and wellness. This includes: A yearly physical exam. This is also called an annual wellness visit. Regular dental and eye exams. Immunizations. Screening for certain conditions. Healthy lifestyle choices, such as: Eating a healthy diet. Getting regular exercise. Not using drugs or products that contain nicotine and tobacco. Limiting alcohol use. What can I expect for my preventive care visit? Physical exam Your health care provider will check your: Height and weight. These may be used to calculate your BMI (body mass index). BMI is a measurement that tells if you are at a healthy weight. Heart rate and blood pressure. Body temperature. Skin for abnormal spots. Counseling Your health care provider may ask you questions about your: Past medical problems. Family's medical history. Alcohol, tobacco, and drug use. Emotional well-being. Home life and relationship well-being. Sexual activity. Diet, exercise, and sleep habits. History of falls. Memory and ability to understand (cognition). Work and work Astronomer. Access to firearms. What immunizations do I need? Vaccines are usually given at various ages, according to a schedule. Your health care provider will recommend vaccines for you based on your age, medical history, and lifestyle or other factors, such as travel or where you work. What tests do I need? Blood tests Lipid and cholesterol levels. These may be checked every  5 years, or more often depending on your overall health. Hepatitis C test. Hepatitis B test. Screening Lung cancer screening. You may have this screening every year starting at age 5 if you have a 30-pack-year history of smoking and currently smoke or have quit within the past 15 years. Colorectal cancer screening. All adults should have this screening starting at age 22 and continuing until age 64. Your health care provider may recommend screening at age 86 if you are at increased risk. You will have tests every 1-10 years, depending on your results and the type of screening test. Prostate cancer screening. Recommendations will vary depending on your family history and other risks. Genital exam to check for testicular cancer or hernias. Diabetes screening. This is done by checking your blood sugar (glucose) after you have not eaten for a while (fasting). You may have this done every 1-3 years. Abdominal aortic aneurysm (AAA) screening. You may need this if you are a current or former smoker. STD (sexually transmitted disease) testing, if you are at risk. Follow these instructions at home: Eating and drinking  Eat a diet that includes fresh fruits and vegetables, whole grains, lean protein, and low-fat dairy products. Limit your intake of foods with high amounts of sugar, saturated fats, and salt. Take vitamin and mineral supplements as recommended by your health care provider. Do not drink alcohol if your health care provider tells you not to drink. If you drink alcohol: Limit how much you have to 0-2 drinks a day. Be aware of how much alcohol is in your drink. In the [place] one drink equals one 12 oz bottle of beer (355 mL), one 5 oz glass  of wine (148 mL), or one 1 oz glass of hard liquor (44 mL). Lifestyle Take daily care of your teeth and gums. Brush your teeth every morning and night with fluoride toothpaste. Floss one time each day. Stay active. Exercise for at least 30 minutes 5  or more days each week. Do not use any products that contain nicotine or tobacco, such as cigarettes, e-cigarettes, and chewing tobacco. If you need help quitting, ask your health care provider. Do not use drugs. If you are sexually active, practice safe sex. Use a condom or other form of protection to prevent STIs (sexually transmitted infections). Talk with your health care provider about taking a low-dose aspirin or statin. Find healthy ways to cope with stress, such as: Meditation, yoga, or listening to music. Journaling. Talking to a trusted person. Spending time with friends and family. Safety Always wear your seat belt while driving or riding in a vehicle. Do not drive: If you have been drinking alcohol. Do not ride with someone who has been drinking. When you are tired or distracted. While texting. Wear a helmet and other protective equipment during sports activities. If you have firearms in your house, make sure you follow all gun safety procedures. What's next? Visit your health care provider once a year for an annual wellness visit. Ask your health care provider how often you should have your eyes and teeth checked. Stay up to date on all vaccines. This information is not intended to replace advice given to you by your health care provider. Make sure you discuss any questions you have with your health care provider. Document Revised: 04/13/2020 Document Reviewed: 01/28/2018 Elsevier Patient Education  2022 [employer].

## 2020-11-08 NOTE — Progress Notes (Signed)
Subjective:    Patient ID: Marvin [name], male    DOB: 25-Nov-1950, 70 y.o.   MRN: [id number]  Patient presents today for wellness check and  and eval of acute and chronic conditions  Groin Pain The patient's primary symptoms include pelvic pain. The patient's pertinent negatives include no genital injury, genital itching, genital lesions, penile discharge, penile pain, priapism, scrotal swelling or testicular pain. This is a new problem. The current episode started 1 to 4 weeks ago. The problem occurs constantly. The problem has been unchanged. The pain is moderate. Pertinent negatives include no abdominal pain, anorexia, chest pain, chills, coughing, diarrhea, discolored urine, dysuria, fever, flank pain, frequency, headaches, hematuria, hesitancy, joint pain, joint swelling, nausea, painful intercourse, rash, shortness of breath, sore throat, urgency, urinary retention or vomiting. The symptoms are aggravated by heavy lifting, activity and tactile pressure. He has tried nothing for the symptoms. He is sexually active. He never uses condoms. No, his partner does not have an STD. His past medical history is significant for an inguinal hernia. There is no history of BPH, chlamydia, cryptorchidism, erectile aid use, erectile dysfunction, a femoral hernia, gonorrhea, herpes simplex, HIV, kidney stones, prostatitis, sickle cell disease, syphilis or varicocele.  Constipation due to medication: Developed constipation with ozempic Metamucil started yesterday and this has helped.  Reviewed labs completed y weight management clinic on 10/23/20.  Vision:up to date, cataract in left eye Dental: up to date, partial denture in front Diet:under the care of weight management clinic Exercise:water aerobic, strengthening Weight:  Wt Readings from Last 3 Encounters:  11/08/20 233 lb 12.8 oz (106.1 kg)  11/07/20 228 lb (103.4 kg)  10/23/20 229 lb (103.9 kg)   Up to date with  colonoscopy  Depression/Suicide: Depression screen [facility] 2/9 11/08/2020 09/18/2020 07/04/2020 08/10/2019 02/21/2019 02/24/2018 02/23/2017  Decreased Interest 0 0 1 0 0 0 0  Down, Depressed, Hopeless 0 0 0 0 0 0 0  PHQ - 2 Score 0 0 1 0 0 0 0  Altered sleeping 1 - 0 - - - -  Tired, decreased energy 0 - 1 - - - -  Change in appetite 0 - 1 - - - -  Feeling bad or failure about yourself  0 - 0 - - - -  Trouble concentrating 0 - 0 - - - -  Moving slowly or fidgety/restless 0 - 0 - - - -  Suicidal thoughts 0 - 0 - - - -  PHQ-9 Score 1 - 3 - - - -  Difficult doing work/chores - - Somewhat difficult - - - -   Immunizations: (TDAP, Hep C screen, Pneumovax, Influenza, zoster)  Health Maintenance  Topic Date Due   Zoster (Shingles) Vaccine (1 of 2) Never done   Hemoglobin A1C  04/22/2021   Complete foot exam   05/10/2021   Eye exam for diabetics  07/25/2021   Urine Protein Check  10/23/2021   Colon Cancer Screening  07/29/2027   Tetanus Vaccine  08/25/2027   Flu Shot  Completed   COVID-19 Vaccine  Completed   Hepatitis C Screening: USPSTF Recommendation to screen - Ages 18-79 yo.  Completed   HPV Vaccine  Aged Out   Fall Risk: Fall Risk  09/18/2020 05/10/2020 08/10/2019 02/21/2019 02/24/2018 02/23/2017  Falls in the past year? 0 0 0 0 0 No  Number falls in past yr: 0 0 0 - - -  Injury with Fall? 0 0 0 - - -  Risk for fall due  to : - - No Fall Risks - - -  Follow up Falls evaluation completed;Falls prevention discussed - Falls prevention discussed - - -   Advanced Directive: Advanced Directives 11/08/2020  Does Patient Have a Medical Advance Directive? No  Would patient like information on creating a medical advance directive? Yes (MAU/Ambulatory/Procedural Areas - Information given)    Medications and allergies reviewed with patient and updated if appropriate.  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   Ventral  hernia without obstruction or gangrene 03/15/2020   Primary osteoarthritis of right hip 10/03/2019   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   Chronic bilateral low back pain without sciatica 02/21/2019   Type 2 diabetes mellitus without complication, without long-term current use of insulin (HCC) 08/25/2017   Hyperuricemia 08/25/2017   Acid reflux 02/23/2017   Gout 05/20/2016   HTN (hypertension) 09/11/2015   Hyperlipidemia 09/11/2015    Current Outpatient Medications on File Prior to Visit  Medication Sig Dispense Refill   allopurinol (ZYLOPRIM) 100 MG tablet TAKE ONE TABLET BY MOUTH TWICE A DAY ** NEEDS TO SCHEDULE FOLLOW UP APPOINTMENT** 180 tablet 1   amoxicillin (AMOXIL) 500 MG capsule Take 1,000 mg by mouth 2 (two) times daily.     aspirin EC 81 MG tablet Take 1 tablet (81 mg total) by mouth 2 (two) times daily. 84 tablet 11   colchicine 0.6 MG tablet Take 0.6 mg by mouth as needed.     cyclobenzaprine (FLEXERIL) 10 MG tablet TAKE ONE-HALF TABLET BY MOUTH EVERY NIGHT AT BEDTIME AS NEEDED FOR MUSCLE SPASMS 30 tablet 2   fluticasone (FLONASE) 50 MCG/ACT nasal spray SPRAY TWO SPRAYS IN EACH NOSTRIL ONCE DAILY 16 mL 0   Insulin Pen Needle 32G X 4 MM MISC Use with the Ozempic weekly 50 each 0   Semaglutide,0.25 or 0.5MG /DOS, (OZEMPIC, 0.25 OR 0.5 MG/DOSE,) 2 MG/1.5ML SOPN Inject 0.5 mg into the skin once a week. 1.5 mL 0   traMADol (ULTRAM) 50 MG tablet Take 1 tablet (50 mg total) by mouth 2 (two) times daily as needed. 30 tablet 2   Vitamin D, Ergocalciferol, (DRISDOL) 1.25 MG (50000 UNIT) CAPS capsule Take 1 capsule (50,000 Units total) by mouth every 7 (seven) days. 12 capsule 0   No current facility-administered medications on file prior to visit.    Past Medical History:  Diagnosis Date   Alcohol abuse    Allergy    Arthritis    Back pain    Constipation 03/15/2020   Drug abuse (HCC)    GERD (gastroesophageal reflux disease)    Gout    Hernia     Hypercholesteremia    Hypertension    Joint pain    Obesity    Osteoarthritis    Prediabetes    Stomach ulcer    Swallowing difficulty    Wears dentures    bottom front    Past Surgical History:  Procedure Laterality Date   HERNIA REPAIR     twice   REPLACEMENT TOTAL KNEE     partial   SHOULDER SURGERY     right   TOTAL HIP ARTHROPLASTY Right 10/03/2019   Procedure: RIGHT TOTAL HIP ARTHROPLASTY ANTERIOR APPROACH;  Surgeon: Tarry Kos, MD;  Location: [facility];  Service: Orthopedics;  Laterality: Right;    Social History   Socioeconomic History   Marital status: Married    Spouse name:  Not on file   Number of children: Not on file   Years of education: Not on file   Highest education level: Not on file  Occupational History   Not on file  Tobacco Use   Smoking status: Former    Types: Cigarettes    Quit date: 03/20/1998    Years since quitting: 22.6   Smokeless tobacco: Former    Types: Associate Professor Use: Never used  Substance and Sexual Activity   Alcohol use: Not Currently   Drug use: No   Sexual activity: Yes    Birth control/protection: Condom  Other Topics Concern   Not on file  Social History Narrative   Not on file   Social Determinants of Health   Financial Resource Strain: Low Risk    Difficulty of Paying Living Expenses: Not hard at all  Food Insecurity: No Food Insecurity   Worried About Programme researcher, broadcasting/film/video in the Last Year: Never true   Ran Out of Food in the Last Year: Never true  Transportation Needs: No Transportation Needs   Lack of Transportation (Medical): No   Lack of Transportation (Non-Medical): No  Physical Activity: Sufficiently Active   Days of Exercise per Week: 4 days   Minutes of Exercise per Session: 50 min  Stress: No Stress Concern Present   Feeling of Stress : Not at all  Social Connections: Moderately Integrated   Frequency of Communication with Friends and Family: More than three times a week   Frequency of  Social Gatherings with Friends and Family: More than three times a week   Attends Religious Services: More than 4 times per year   Active Member of [employer] or Organizations: No   Attends Engineer, structural: Never   Marital Status: Married    Family History  Problem Relation Age of Onset   Healthy Mother    Healthy Father    Cancer Brother        bone cancer   Cancer Maternal Aunt        breast cancer   Cancer Maternal Uncle        lukemia   Cancer Cousin        unknown   Cancer Maternal Uncle        lukemia   Cancer Paternal Uncle        prostate   Hearing loss Maternal Grandfather 67        Review of Systems  Constitutional:  Negative for chills and fever.  HENT:  Negative for sore throat.   Respiratory:  Negative for cough and shortness of breath.   Cardiovascular:  Negative for chest pain.  Gastrointestinal:  Negative for abdominal pain, anorexia, diarrhea, nausea and vomiting.  Genitourinary:  Positive for pelvic pain. Negative for dysuria, flank pain, frequency, hesitancy, penile discharge, penile pain, scrotal swelling, testicular pain and urgency.  Musculoskeletal:  Negative for joint pain.  Skin:  Negative for rash.  Neurological:  Negative for headaches.   Objective:   Vitals:   11/08/20 0939  BP: 102/60  Pulse: 66  Temp: 98.2 F (36.8 C)  SpO2: 96%    Body mass index is 33.55 kg/m.   Physical Examination:  Physical Exam Vitals reviewed. Exam conducted with a chaperone present.  Constitutional:      General: He is not in acute distress.    Appearance: He is well-developed. He is obese.  HENT:     Right Ear: Tympanic membrane, ear canal  and external ear normal.     Left Ear: Tympanic membrane, ear canal and external ear normal.  Eyes:     Extraocular Movements: Extraocular movements intact.     Conjunctiva/sclera: Conjunctivae normal.  Cardiovascular:     Rate and Rhythm: Normal rate and regular rhythm.     Pulses: Normal pulses.      Heart sounds: Normal heart sounds.  Pulmonary:     Effort: Pulmonary effort is normal. No respiratory distress.     Breath sounds: Normal breath sounds.  Chest:     Chest wall: No tenderness.  Abdominal:     General: Bowel sounds are normal.     Palpations: Abdomen is soft.     Hernia: A hernia is present. Hernia is present in the left inguinal area. There is no hernia in the right inguinal area.  Genitourinary:    Comments: Unable to reduce inguinalhernia Musculoskeletal:        General: Normal range of motion.     Cervical back: Normal range of motion and neck supple.     Right lower leg: No edema.     Left lower leg: No edema.  Lymphadenopathy:     Cervical: No cervical adenopathy.     Lower Body: No right inguinal adenopathy. No left inguinal adenopathy.  Skin:    General: Skin is warm and dry.  Neurological:     Mental Status: He is alert and oriented to person, place, and time.     Deep Tendon Reflexes: Reflexes are normal and symmetric.  Psychiatric:        Mood and Affect: Mood normal.        Behavior: Behavior normal.        Thought Content: Thought content normal.    ASSESSMENT and PLAN: This visit occurred during the SARS-CoV-2 public health emergency.  Safety protocols were in place, including screening questions prior to the visit, additional usage of staff PPE, and extensive cleaning of exam room while observing appropriate contact time as indicated for disinfecting solutions.   Roxie was seen today for annual exam.  Diagnoses and all orders for this visit:  Primary hypertension  Type 2 diabetes mellitus without complication, without long-term current use of insulin (HCC)  Hyperuricemia -     Cancel: Uric acid; Future -     Uric acid; Future  Mixed hyperlipidemia -     CK; Future -     simvastatin (ZOCOR) 20 MG tablet; Take 1 tablet (20 mg total) by mouth 3 (three) times a week.  Prostate cancer screening -     Cancel: PSA; Future -     PSA;  Future  Inguinal hernia of left side with obstruction -     Cancel: US Pelvis Limited; Future -     US Pelvis Limited; Future  Essential hypertension -     amLODipine (NORVASC) 10 MG tablet; Take 1 tablet (10 mg total) by mouth daily.     Problem List Items Addressed This Visit       Cardiovascular and Mediastinum   HTN (hypertension) - Primary   Relevant Medications   amLODipine (NORVASC) 10 MG tablet   simvastatin (ZOCOR) 20 MG tablet     Endocrine   Type 2 diabetes mellitus without complication, without long-term current use of insulin (HCC)   Relevant Medications   simvastatin (ZOCOR) 20 MG tablet     Other   Hyperlipidemia   Relevant Medications   amLODipine (NORVASC) 10 MG tablet   simvastatin (  ZOCOR) 20 MG tablet   Other Relevant Orders   CK   Hyperuricemia   Relevant Orders   Uric acid   Other Visit Diagnoses     Prostate cancer screening       Relevant Orders   PSA   Inguinal hernia of left side with obstruction       Relevant Orders   US Pelvis Limited   Essential hypertension       Relevant Medications   amLODipine (NORVASC) 10 MG tablet   simvastatin (ZOCOR) 20 MG tablet       Follow up: Return in about 6 months (around 05/08/2021) for DM and HTN, hyperlipidemia (fasting).  Alysia Penna, NP

## 2020-11-13 ENCOUNTER — Encounter: Payer: Medicare HMO | Admitting: Nurse Practitioner

## 2020-11-19 ENCOUNTER — Ambulatory Visit
Admission: RE | Admit: 2020-11-19 | Discharge: 2020-11-19 | Disposition: A | Discharge status: Home / Self Care | Payer: Medicare HMO | Source: Ambulatory Visit | Attending: Nurse Practitioner | Admitting: Nurse Practitioner

## 2020-11-19 DIAGNOSIS — R1909 Other intra-abdominal and pelvic swelling, mass and lump: Secondary | ICD-10-CM | POA: Diagnosis not present

## 2020-11-19 DIAGNOSIS — K4030 Unilateral inguinal hernia, with obstruction, without gangrene, not specified as recurrent: Secondary | ICD-10-CM

## 2020-11-19 DIAGNOSIS — K4020 Bilateral inguinal hernia, without obstruction or gangrene, not specified as recurrent: Secondary | ICD-10-CM | POA: Diagnosis not present

## 2020-11-26 ENCOUNTER — Telehealth: Payer: Self-pay | Admitting: Nurse Practitioner

## 2020-11-26 DIAGNOSIS — K4030 Unilateral inguinal hernia, with obstruction, without gangrene, not specified as recurrent: Secondary | ICD-10-CM

## 2020-11-26 NOTE — Telephone Encounter (Signed)
-----   Message from [name], [place] sent at 11/23/2020  9:40 AM EDT ----- Patient notified and verbalized understanding Pt states he is okay with referral being placed.

## 2020-11-28 ENCOUNTER — Encounter (INDEPENDENT_AMBULATORY_CARE_PROVIDER_SITE_OTHER): Payer: Self-pay | Admitting: Bariatrics

## 2020-11-28 ENCOUNTER — Other Ambulatory Visit: Payer: Self-pay

## 2020-11-28 ENCOUNTER — Ambulatory Visit (INDEPENDENT_AMBULATORY_CARE_PROVIDER_SITE_OTHER): Payer: 59 | Admitting: Bariatrics

## 2020-11-28 VITALS — BP 126/82 | HR 70 | Temp 97.8°F | Ht 70.0 in | Wt 227.0 lb

## 2020-11-28 DIAGNOSIS — E669 Obesity, unspecified: Secondary | ICD-10-CM | POA: Diagnosis not present

## 2020-11-28 DIAGNOSIS — Z6834 Body mass index (BMI) 34.0-34.9, adult: Secondary | ICD-10-CM

## 2020-11-28 DIAGNOSIS — E782 Mixed hyperlipidemia: Secondary | ICD-10-CM | POA: Diagnosis not present

## 2020-11-28 DIAGNOSIS — E559 Vitamin D deficiency, unspecified: Secondary | ICD-10-CM

## 2020-11-28 DIAGNOSIS — E1169 Type 2 diabetes mellitus with other specified complication: Secondary | ICD-10-CM | POA: Diagnosis not present

## 2020-11-28 MED ORDER — OZEMPIC (0.25 OR 0.5 MG/DOSE) 2 MG/1.5ML ~~LOC~~ SOPN: 0.5000 mg | PEN_INJECTOR | SUBCUTANEOUS | 0 refills | Status: DC

## 2020-11-28 NOTE — Progress Notes (Signed)
Chief Complaint:   OBESITY Marvin [name] is here to discuss his progress with his obesity treatment plan along with follow-up of his obesity related diagnoses. Marvin [name] is on the Category 2 Plan and states he is following his eating plan approximately 70% of the time. Marvin [name] states he is counting steps 12,000-14,000  4 times per week.  Today's visit was #: 9 Starting weight: 242 lbs Starting date: 07/04/2020 Today's weight: 227 lbs Today's date: 11/28/2020 Total lbs lost to date: 15 lbs Total lbs lost since last in-office visit: 1 lb  Interim History: Marvin [name] is down 1 lb since his last visit.  Subjective:   1. Diabetes mellitus type 2 in obese Marvin [name] notes constipation.  2. Mixed hyperlipidemia Marvin [name] is taking Zocor currently.  3. Vitamin D deficiency He is currently taking prescription vitamin D 50,000 IU each week. He denies nausea, vomiting or muscle weakness. His last Vitamin D level was 42.4.  Assessment/Plan:   1. Diabetes mellitus type 2 in obese (HCC) We will refill Ozempic for 1 month with no refills. Good blood sugar control is important to decrease the likelihood of diabetic complications such as nephropathy, neuropathy, limb loss, blindness, coronary artery disease, and death. Intensive lifestyle modification including diet, exercise and weight loss are the first line of treatment for diabetes.   - Semaglutide,0.25 or 0.5MG /DOS, (OZEMPIC, 0.25 OR 0.5 MG/DOSE,) 2 MG/1.5ML SOPN; Inject 0.5 mg into the skin once a week.  Dispense: 1.5 mL; Refill: 0  2. Mixed hyperlipidemia Cardiovascular risk and specific lipid/LDL goals reviewed.  We discussed several lifestyle modifications today and Marvin [name] will continue to work on diet, exercise and weight loss efforts. Marvin [name] will continue medications. Orders and follow up as documented in patient record.   Counseling Intensive lifestyle modifications are the first line treatment for this issue. Dietary changes:  Increase soluble fiber. Decrease simple carbohydrates. Exercise changes: Moderate to vigorous-intensity aerobic activity 150 minutes per week if tolerated. Lipid-lowering medications: see documented in medical record.   3. Vitamin D deficiency Low Vitamin D level contributes to fatigue and are associated with obesity, breast, and colon cancer. Marvin [name] agrees to switch to prescription Vitamin D 50,000 IU every 2 weeks and he will follow-up for routine testing of Vitamin D, at least 2-3 times per year to avoid over-replacement.   4. Obesity with current BMI of 32.6 Marvin [name] is currently in the action stage of change. As such, his goal is to continue with weight loss efforts. He has agreed to the Category 2 Plan.   Exercise goals:  Marvin [name] will continue going to [employer].  Behavioral modification strategies: increasing lean protein intake, decreasing simple carbohydrates, increasing vegetables, increasing water intake, decreasing eating out, no skipping meals, meal planning and cooking strategies, keeping healthy foods in the home, and planning for success.  Marvin [name] has agreed to follow-up with our clinic in 3-4 weeks. He was informed of the importance of frequent follow-up visits to maximize his success with intensive lifestyle modifications for his multiple health conditions.   Objective:   Blood pressure 126/82, pulse 70, temperature 97.8 F (36.6 C), height 5\' 10"  (1.778 m), weight 227 lb (103 kg), SpO2 97 %. Body mass index is 32.57 kg/m.  General: Cooperative, alert, well developed, in no acute distress. HEENT: Conjunctivae and lids unremarkable. Cardiovascular: Regular rhythm.  Lungs: Normal work of breathing. Neurologic: No focal deficits.   Lab Results  Component Value Date   CREATININE 1.00 10/23/2020   BUN 12 10/23/2020   NA 142  10/23/2020   K 4.7 10/23/2020   CL 106 10/23/2020   CO2 23 10/23/2020   Lab Results  Component Value Date   ALT 14 10/23/2020   AST 16  10/23/2020   ALKPHOS 104 10/23/2020   BILITOT 0.3 10/23/2020   Lab Results  Component Value Date   HGBA1C 5.9 (H) 10/23/2020   HGBA1C 6.4 05/11/2020   HGBA1C 6.3 (H) 09/30/2019   HGBA1C 6.2 03/01/2019   HGBA1C 6.3 (H) 08/03/2018   Lab Results  Component Value Date   INSULIN 11.7 10/23/2020   INSULIN 10.6 07/04/2020   Lab Results  Component Value Date   TSH 2.170 07/04/2020   Lab Results  Component Value Date   CHOL 170 10/23/2020   HDL 40 10/23/2020   LDLCALC 117 (H) 10/23/2020   TRIG 67 10/23/2020   CHOLHDL 4 05/11/2020   Lab Results  Component Value Date   VD25OH 42.4 10/23/2020   VD25OH 7.3 (L) 07/04/2020   Lab Results  Component Value Date   WBC 15.4 (H) 10/04/2019   HGB 12.6 (L) 10/04/2019   HCT 37.8 (L) 10/04/2019   MCV 86.7 10/04/2019   PLT 182 10/04/2019   No results found for: IRON, TIBC, FERRITIN  Obesity Behavioral Intervention:   Approximately 15 minutes were spent on the discussion below.  ASK: We discussed the diagnosis of obesity with Marvin [name] today and Marvin [name] agreed to give [place] permission to discuss obesity behavioral modification therapy today.  ASSESS: Marvin [name] has the diagnosis of obesity and his BMI today is 32.6. Marvin [name] is in the action stage of change.   ADVISE: Marvin [name] was educated on the multiple health risks of obesity as well as the benefit of weight loss to improve his health. He was advised of the need for long term treatment and the importance of lifestyle modifications to improve his current health and to decrease his risk of future health problems.  AGREE: Multiple dietary modification options and treatment options were discussed and Marvin [name] agreed to follow the recommendations documented in the above note.  ARRANGE: Marvin [name] was educated on the importance of frequent visits to treat obesity as outlined per CMS and USPSTF guidelines and agreed to schedule his next follow up appointment today.  Attestation Statements:    Reviewed by clinician on day of visit: allergies, medications, problem list, medical history, surgical history, family history, social history, and previous encounter notes.  I, [name], RMA, am acting as Energy manager for [employer], DO.   I have reviewed the above documentation for accuracy and completeness, and I agree with the above. Corinna Capra, DO

## 2020-11-29 ENCOUNTER — Encounter (INDEPENDENT_AMBULATORY_CARE_PROVIDER_SITE_OTHER): Payer: Self-pay | Admitting: Bariatrics

## 2020-12-21 DIAGNOSIS — K4090 Unilateral inguinal hernia, without obstruction or gangrene, not specified as recurrent: Secondary | ICD-10-CM | POA: Diagnosis not present

## 2020-12-24 ENCOUNTER — Other Ambulatory Visit (INDEPENDENT_AMBULATORY_CARE_PROVIDER_SITE_OTHER): Payer: Self-pay | Admitting: Bariatrics

## 2020-12-24 DIAGNOSIS — E1169 Type 2 diabetes mellitus with other specified complication: Secondary | ICD-10-CM

## 2020-12-24 DIAGNOSIS — E669 Obesity, unspecified: Secondary | ICD-10-CM

## 2020-12-26 ENCOUNTER — Other Ambulatory Visit: Payer: Self-pay

## 2020-12-26 ENCOUNTER — Encounter (INDEPENDENT_AMBULATORY_CARE_PROVIDER_SITE_OTHER): Payer: Self-pay | Admitting: Bariatrics

## 2020-12-26 ENCOUNTER — Ambulatory Visit (INDEPENDENT_AMBULATORY_CARE_PROVIDER_SITE_OTHER): Payer: 59 | Admitting: Bariatrics

## 2020-12-26 VITALS — BP 136/82 | HR 65 | Temp 98.1°F | Ht 70.0 in | Wt 226.0 lb

## 2020-12-26 DIAGNOSIS — I10 Essential (primary) hypertension: Secondary | ICD-10-CM | POA: Diagnosis not present

## 2020-12-26 DIAGNOSIS — E669 Obesity, unspecified: Secondary | ICD-10-CM

## 2020-12-26 DIAGNOSIS — Z6834 Body mass index (BMI) 34.0-34.9, adult: Secondary | ICD-10-CM

## 2020-12-26 DIAGNOSIS — E6609 Other obesity due to excess calories: Secondary | ICD-10-CM | POA: Diagnosis not present

## 2020-12-26 DIAGNOSIS — E1169 Type 2 diabetes mellitus with other specified complication: Secondary | ICD-10-CM | POA: Diagnosis not present

## 2020-12-26 MED ORDER — OZEMPIC (0.25 OR 0.5 MG/DOSE) 2 MG/1.5ML ~~LOC~~ SOPN: 0.5000 mg | PEN_INJECTOR | SUBCUTANEOUS | 0 refills | Status: DC

## 2020-12-26 NOTE — Progress Notes (Signed)
Chief Complaint:   OBESITY Marvin [name] is here to discuss his progress with his obesity treatment plan along with follow-up of his obesity related diagnoses. Marvin [name] is on the Category 2 Plan and states he is following his eating plan approximately 70% of the time. Marvin [name] states he is walking 6 miles and fishing 4 times per week.  Today's visit was #: 10 Starting weight: 242 lbs Starting date: 07/04/2020 Today's weight: 226 lbs Today's date: 12/26/2020 Total lbs lost to date: 16 lbs Total lbs lost since last in-office visit: 1 lb  Interim History: Deaundra is down 1 lb. He has an Inguinal hernia and may not get surgery until January 2023.  Subjective:   1. Primary hypertension Marvin [name] is currently taking Norvasc. His blood pressure is controlled.   2. Diabetes mellitus type 2 in obese [facility]) Marvin [name] is taking Ozempic 0.5 mg currently.   Assessment/Plan:   1. Primary hypertension Marvin [name] will continue his medication. He is working on healthy weight loss and exercise to improve blood pressure control. We will watch for signs of hypotension as he continues his lifestyle modifications.  2. Diabetes mellitus type 2 in obese [facility]) Marvin [name] will continue his medications. He will consider increasing Ozempic to 1 mg. We will refill Ozempic 0.52 mg with no refills. Good blood sugar control is important to decrease the likelihood of diabetic complications such as nephropathy, neuropathy, limb loss, blindness, coronary artery disease, and death. Intensive lifestyle modification including diet, exercise and weight loss are the first line of treatment for diabetes.   3. Obesity, current BMI 32.5 Marvin [name] is currently in the action stage of change. As such, his goal is to continue with weight loss efforts. He has agreed to the Category 2 Plan.   Marvin [name] will continue meal planning and mindful eating. Strategies for the holiday were provided today.   Exercise goals:  Marvin [name] will continue trout  fishing and walking.  Behavioral modification strategies: increasing lean protein intake, decreasing simple carbohydrates, increasing vegetables, increasing water intake, decreasing eating out, no skipping meals, meal planning and cooking strategies, keeping healthy foods in the home, and planning for success.  Marvin [name] has agreed to follow-up with our clinic in 2 weeks. He was informed of the importance of frequent follow-up visits to maximize his success with intensive lifestyle modifications for his multiple health conditions.   Objective:   Blood pressure 136/82, pulse 65, temperature 98.1 F (36.7 C), height 5\' 10"  (1.778 m), weight 226 lb (102.5 kg), SpO2 97 %. Body mass index is 32.43 kg/m.  General: Cooperative, alert, well developed, in no acute distress. HEENT: Conjunctivae and lids unremarkable. Cardiovascular: Regular rhythm.  Lungs: Normal work of breathing. Neurologic: No focal deficits.   Lab Results  Component Value Date   CREATININE 1.00 10/23/2020   BUN 12 10/23/2020   NA 142 10/23/2020   K 4.7 10/23/2020   CL 106 10/23/2020   CO2 23 10/23/2020   Lab Results  Component Value Date   ALT 14 10/23/2020   AST 16 10/23/2020   ALKPHOS 104 10/23/2020   BILITOT 0.3 10/23/2020   Lab Results  Component Value Date   HGBA1C 5.9 (H) 10/23/2020   HGBA1C 6.4 05/11/2020   HGBA1C 6.3 (H) 09/30/2019   HGBA1C 6.2 03/01/2019   HGBA1C 6.3 (H) 08/03/2018   Lab Results  Component Value Date   INSULIN 11.7 10/23/2020   INSULIN 10.6 07/04/2020   Lab Results  Component Value Date   TSH 2.170 07/04/2020   Lab  Results  Component Value Date   CHOL 170 10/23/2020   HDL 40 10/23/2020   LDLCALC 117 (H) 10/23/2020   TRIG 67 10/23/2020   CHOLHDL 4 05/11/2020   Lab Results  Component Value Date   VD25OH 42.4 10/23/2020   VD25OH 7.3 (L) 07/04/2020   Lab Results  Component Value Date   WBC 15.4 (H) 10/04/2019   HGB 12.6 (L) 10/04/2019   HCT 37.8 (L) 10/04/2019   MCV  86.7 10/04/2019   PLT 182 10/04/2019   No results found for: IRON, TIBC, FERRITIN  Attestation Statements:   Reviewed by clinician on day of visit: allergies, medications, problem list, medical history, surgical history, family history, social history, and previous encounter notes.  I, Lizbeth Bark, RMA, am acting as Location manager for [employer], DO.   I have reviewed the above documentation for accuracy and completeness, and I agree with the above. Jearld Lesch, DO

## 2020-12-28 ENCOUNTER — Encounter (INDEPENDENT_AMBULATORY_CARE_PROVIDER_SITE_OTHER): Payer: Self-pay | Admitting: Bariatrics

## 2021-01-08 ENCOUNTER — Other Ambulatory Visit: Payer: Self-pay | Admitting: Nurse Practitioner

## 2021-01-08 DIAGNOSIS — J301 Allergic rhinitis due to pollen: Secondary | ICD-10-CM

## 2021-01-15 ENCOUNTER — Encounter (INDEPENDENT_AMBULATORY_CARE_PROVIDER_SITE_OTHER): Payer: Self-pay

## 2021-01-15 ENCOUNTER — Telehealth (INDEPENDENT_AMBULATORY_CARE_PROVIDER_SITE_OTHER): Payer: Self-pay | Admitting: Bariatrics

## 2021-01-15 ENCOUNTER — Other Ambulatory Visit: Payer: Self-pay

## 2021-01-15 ENCOUNTER — Ambulatory Visit (INDEPENDENT_AMBULATORY_CARE_PROVIDER_SITE_OTHER): Payer: 59 | Admitting: Bariatrics

## 2021-01-15 ENCOUNTER — Encounter (INDEPENDENT_AMBULATORY_CARE_PROVIDER_SITE_OTHER): Payer: Self-pay | Admitting: Bariatrics

## 2021-01-15 VITALS — BP 132/84 | HR 60 | Temp 97.5°F | Ht 70.0 in | Wt 231.0 lb

## 2021-01-15 DIAGNOSIS — Z6834 Body mass index (BMI) 34.0-34.9, adult: Secondary | ICD-10-CM | POA: Diagnosis not present

## 2021-01-15 DIAGNOSIS — E6609 Other obesity due to excess calories: Secondary | ICD-10-CM | POA: Diagnosis not present

## 2021-01-15 DIAGNOSIS — I10 Essential (primary) hypertension: Secondary | ICD-10-CM

## 2021-01-15 DIAGNOSIS — E1169 Type 2 diabetes mellitus with other specified complication: Secondary | ICD-10-CM

## 2021-01-15 DIAGNOSIS — J301 Allergic rhinitis due to pollen: Secondary | ICD-10-CM

## 2021-01-15 DIAGNOSIS — E66811 Obesity, class 1: Secondary | ICD-10-CM

## 2021-01-15 DIAGNOSIS — E669 Obesity, unspecified: Secondary | ICD-10-CM | POA: Diagnosis not present

## 2021-01-15 MED ORDER — FLUTICASONE PROPIONATE 50 MCG/ACT NA SUSP
NASAL | 2 refills | Status: DC
Start: 2021-01-15 — End: 2021-06-18

## 2021-01-15 MED ORDER — SEMAGLUTIDE (1 MG/DOSE) 4 MG/3ML ~~LOC~~ SOPN: 1.0000 mg | PEN_INJECTOR | SUBCUTANEOUS | 0 refills | Status: DC

## 2021-01-15 NOTE — Telephone Encounter (Signed)
Prior authorization approved for Ozempic. Patient sent mychart message.  

## 2021-01-15 NOTE — Telephone Encounter (Signed)
Refill request sent by pharmacy Chart supports Rx Last seen 10/08/20 Next OV 05/13/21

## 2021-01-15 NOTE — Progress Notes (Signed)
Chief Complaint:   OBESITY Marvin [name] is here to discuss his progress with his obesity treatment plan along with follow-up of his obesity related diagnoses. Marvin [name] is on the Category 2 Plan and states he is following his eating plan approximately 80% of the time. [name] states he is doing cardio and weights for 60 minutes 2 times per week.  Today's visit was #: 11 Starting weight: 242 lbs Starting date: 07/04/2020 Today's weight: 231 lbs Today's date:01/15/2021 Total lbs lost to date: 11 lbs Total lbs lost since last in-office visit: 0  Interim History: Marvin [name] is up 5 lbs over the holidays but has done well overall. He ate more doing the holiday and decreased his exercise.  Subjective:   1. Diabetes mellitus type 2 in obese [facility]) Marvin [name] is currently taking Ozempic.  2. Essential hypertension Marvin [name] is taking Norvasc currently. His blood pressure is controlled. His last blood pressure was 132/84.  Assessment/Plan:   1. Diabetes mellitus type 2 in obese (HCC) We will refill Ozempic with no refills. Good blood sugar control is important to decrease the likelihood of diabetic complications such as nephropathy, neuropathy, limb loss, blindness, coronary artery disease, and death. Intensive lifestyle modification including diet, exercise and weight loss are the first line of treatment for diabetes.   - Semaglutide, 1 MG/DOSE, 4 MG/3ML SOPN; Inject 1 mg as directed once a week.  Dispense: 3 mL; Refill: 0  2. Essential hypertension Jamorris will continue his medications. He is working on healthy weight loss and exercise to improve blood pressure control. We will watch for signs of hypotension as he continues his lifestyle modifications.  3. Obesity, current BMI 33.2 Marvin [name] is currently in the action stage of change. As such, his goal is to continue with weight loss efforts. He has agreed to the Category 2 Plan.   Marvin [name] will continue meal planning and he will continue  intentional eating.  Exercise goals:  [name] will increase exercise until hernia repair.  Behavioral modification strategies: increasing lean protein intake, decreasing simple carbohydrates, increasing vegetables, increasing water intake, decreasing eating out, no skipping meals, meal planning and cooking strategies, keeping healthy foods in the home, and planning for success.  Marvin [name] has agreed to follow-up with our clinic in 6 weeks. He was informed of the importance of frequent follow-up visits to maximize his success with intensive lifestyle modifications for his multiple health conditions.   Objective:   Blood pressure 132/84, pulse 60, temperature (!) 97.5 F (36.4 C), height 5\' 10"  (1.778 m), weight 231 lb (104.8 kg), SpO2 100 %. Body mass index is 33.15 kg/m.  General: Cooperative, alert, well developed, in no acute distress. HEENT: Conjunctivae and lids unremarkable. Cardiovascular: Regular rhythm.  Lungs: Normal work of breathing. Neurologic: No focal deficits.   Lab Results  Component Value Date   CREATININE 1.00 10/23/2020   BUN 12 10/23/2020   NA 142 10/23/2020   K 4.7 10/23/2020   CL 106 10/23/2020   CO2 23 10/23/2020   Lab Results  Component Value Date   ALT 14 10/23/2020   AST 16 10/23/2020   ALKPHOS 104 10/23/2020   BILITOT 0.3 10/23/2020   Lab Results  Component Value Date   HGBA1C 5.9 (H) 10/23/2020   HGBA1C 6.4 05/11/2020   HGBA1C 6.3 (H) 09/30/2019   HGBA1C 6.2 03/01/2019   HGBA1C 6.3 (H) 08/03/2018   Lab Results  Component Value Date   INSULIN 11.7 10/23/2020   INSULIN 10.6 07/04/2020   Lab Results  Component  Value Date   TSH 2.170 07/04/2020   Lab Results  Component Value Date   CHOL 170 10/23/2020   HDL 40 10/23/2020   LDLCALC 117 (H) 10/23/2020   TRIG 67 10/23/2020   CHOLHDL 4 05/11/2020   Lab Results  Component Value Date   VD25OH 42.4 10/23/2020   VD25OH 7.3 (L) 07/04/2020   Lab Results  Component Value Date   WBC 15.4  (H) 10/04/2019   HGB 12.6 (L) 10/04/2019   HCT 37.8 (L) 10/04/2019   MCV 86.7 10/04/2019   PLT 182 10/04/2019   No results found for: IRON, TIBC, FERRITIN  Attestation Statements:   Reviewed by clinician on day of visit: allergies, medications, problem list, medical history, surgical history, family history, social history, and previous encounter notes.  I, [name], RMA, am acting as Energy manager for [employer], DO.   I have reviewed the above documentation for accuracy and completeness, and I agree with the above. Corinna Capra, DO

## 2021-01-16 ENCOUNTER — Encounter (INDEPENDENT_AMBULATORY_CARE_PROVIDER_SITE_OTHER): Payer: Self-pay | Admitting: Bariatrics

## 2021-01-16 NOTE — Telephone Encounter (Signed)
Duplicate, Rx already filled.

## 2021-01-20 ENCOUNTER — Other Ambulatory Visit (INDEPENDENT_AMBULATORY_CARE_PROVIDER_SITE_OTHER): Payer: Self-pay | Admitting: Bariatrics

## 2021-01-20 DIAGNOSIS — E1169 Type 2 diabetes mellitus with other specified complication: Secondary | ICD-10-CM

## 2021-01-21 NOTE — Telephone Encounter (Signed)
Pt last seen by Dr. Brown.  

## 2021-01-30 ENCOUNTER — Telehealth: Payer: Self-pay | Admitting: Nurse Practitioner

## 2021-01-30 DIAGNOSIS — I10 Essential (primary) hypertension: Secondary | ICD-10-CM

## 2021-01-31 NOTE — Telephone Encounter (Signed)
Pharmacy error, pt states he has already been called to pick this medication up.

## 2021-02-01 NOTE — Telephone Encounter (Signed)
Pt called and said that the pharmacy doesn't have this and they need it sent in to refill, please advise

## 2021-02-01 NOTE — Telephone Encounter (Signed)
Spoke with pharmacy and gave verbal for medication and refills.

## 2021-02-28 ENCOUNTER — Encounter (INDEPENDENT_AMBULATORY_CARE_PROVIDER_SITE_OTHER): Payer: Self-pay | Admitting: Bariatrics

## 2021-02-28 ENCOUNTER — Other Ambulatory Visit: Payer: Self-pay

## 2021-02-28 ENCOUNTER — Ambulatory Visit (INDEPENDENT_AMBULATORY_CARE_PROVIDER_SITE_OTHER): Payer: 59 | Admitting: Bariatrics

## 2021-02-28 VITALS — BP 130/80 | HR 68 | Temp 98.0°F | Ht 70.0 in | Wt 229.0 lb

## 2021-02-28 DIAGNOSIS — E6609 Other obesity due to excess calories: Secondary | ICD-10-CM | POA: Diagnosis not present

## 2021-02-28 DIAGNOSIS — E559 Vitamin D deficiency, unspecified: Secondary | ICD-10-CM

## 2021-02-28 DIAGNOSIS — Z6832 Body mass index (BMI) 32.0-32.9, adult: Secondary | ICD-10-CM | POA: Diagnosis not present

## 2021-02-28 DIAGNOSIS — E1169 Type 2 diabetes mellitus with other specified complication: Secondary | ICD-10-CM | POA: Diagnosis not present

## 2021-02-28 DIAGNOSIS — Z6834 Body mass index (BMI) 34.0-34.9, adult: Secondary | ICD-10-CM

## 2021-02-28 MED ORDER — SEMAGLUTIDE (1 MG/DOSE) 4 MG/3ML ~~LOC~~ SOPN: 1.0000 mg | PEN_INJECTOR | SUBCUTANEOUS | 0 refills | Status: DC

## 2021-02-28 MED ORDER — VITAMIN D (ERGOCALCIFEROL) 1.25 MG (50000 UNIT) PO CAPS: 50000.0000 [IU] | ORAL_CAPSULE | ORAL | 0 refills | Status: DC

## 2021-02-28 NOTE — Progress Notes (Signed)
Chief Complaint:   OBESITY Marvin [name] is here to discuss his progress with his obesity treatment plan along with follow-up of his obesity related diagnoses. Marvin [name] is on the Category 2 Plan and states he is following his eating plan approximately 70% of the time. Marvin [name] states he is swimming, walking and fishing for 3 hours 4 times per week.  Today's visit was #: 12 Starting weight: 242 lbs Starting date: 07/04/2020 Today's weight: 229 lbs Today's date: 02/28/2021 Total lbs lost to date: 13 lbs Total lbs lost since last in-office visit: 2 lbs  Interim History: Marvin [name] is down an additional 2 lbs since his last visit.   Subjective:   1. Diabetes mellitus type 2 in obese [name]) Marvin [name] is taking Ozempic as directed.   2. Vitamin D deficiency Marvin [name] is taking his medications as directed.   Assessment/Plan:   1. Diabetes mellitus type 2 in obese (HCC) We will refill Ozempic 1 mg with no refills. Good blood sugar control is important to decrease the likelihood of diabetic complications such as nephropathy, neuropathy, limb loss, blindness, coronary artery disease, and death. Intensive lifestyle modification including diet, exercise and weight loss are the first line of treatment for diabetes.   - Semaglutide, 1 MG/DOSE, 4 MG/3ML SOPN; Inject 1 mg as directed once a week.  Dispense: 3 mL; Refill: 0  2. Vitamin D deficiency Low Vitamin D level contributes to fatigue and are associated with obesity, breast, and colon cancer. We will refill prescription Vitamin D 50,000 IU every week for 1 month with no refills and Marvin [name] will follow-up for routine testing of Vitamin D, at least 2-3 times per year to avoid over-replacement.  - Vitamin D, Ergocalciferol, (DRISDOL) 1.25 MG (50000 UNIT) CAPS capsule; Take 1 capsule (50,000 Units total) by mouth every 7 (seven) days.  Dispense: 12 capsule; Refill: 0  3. Obesity, current BMI 32.9 Marvin [name] is currently in the action stage of change. As  such, his goal is to continue with weight loss efforts. He has agreed to the Category 2 Plan.   Exercise goals:  Marvin [name] will continue walking and fishing. He will remain active.  Behavioral modification strategies: increasing lean protein intake, decreasing simple carbohydrates, increasing vegetables, increasing water intake, decreasing eating out, no skipping meals, meal planning and cooking strategies, keeping healthy foods in the home, and planning for success.  [name] has agreed to follow-up with our clinic in 3-4 weeks. He was informed of the importance of frequent follow-up visits to maximize his success with intensive lifestyle modifications for his multiple health conditions.   Objective:   Blood pressure 130/80, pulse 68, temperature 98 F (36.7 C), height 5\' 10"  (1.778 m), weight 229 lb (103.9 kg), SpO2 98 %. Body mass index is 32.86 kg/m.  General: Cooperative, alert, well developed, in no acute distress. HEENT: Conjunctivae and lids unremarkable. Cardiovascular: Regular rhythm.  Lungs: Normal work of breathing. Neurologic: No focal deficits.   Lab Results  Component Value Date   CREATININE 1.00 10/23/2020   BUN 12 10/23/2020   NA 142 10/23/2020   K 4.7 10/23/2020   CL 106 10/23/2020   CO2 23 10/23/2020   Lab Results  Component Value Date   ALT 14 10/23/2020   AST 16 10/23/2020   ALKPHOS 104 10/23/2020   BILITOT 0.3 10/23/2020   Lab Results  Component Value Date   HGBA1C 5.9 (H) 10/23/2020   HGBA1C 6.4 05/11/2020   HGBA1C 6.3 (H) 09/30/2019   HGBA1C 6.2 03/01/2019  HGBA1C 6.3 (H) 08/03/2018   Lab Results  Component Value Date   INSULIN 11.7 10/23/2020   INSULIN 10.6 07/04/2020   Lab Results  Component Value Date   TSH 2.170 07/04/2020   Lab Results  Component Value Date   CHOL 170 10/23/2020   HDL 40 10/23/2020   LDLCALC 117 (H) 10/23/2020   TRIG 67 10/23/2020   CHOLHDL 4 05/11/2020   Lab Results  Component Value Date   VD25OH 42.4  10/23/2020   VD25OH 7.3 (L) 07/04/2020   Lab Results  Component Value Date   WBC 15.4 (H) 10/04/2019   HGB 12.6 (L) 10/04/2019   HCT 37.8 (L) 10/04/2019   MCV 86.7 10/04/2019   PLT 182 10/04/2019   No results found for: IRON, TIBC, FERRITIN  Attestation Statements:   Reviewed by clinician on day of visit: allergies, medications, problem list, medical history, surgical history, family history, social history, and previous encounter notes.  I, Lizbeth Bark, RMA, am acting as Location manager for [employer], DO.  I have reviewed the above documentation for accuracy and completeness, and I agree with the above. Jearld Lesch, DO

## 2021-03-04 ENCOUNTER — Encounter (INDEPENDENT_AMBULATORY_CARE_PROVIDER_SITE_OTHER): Payer: Self-pay | Admitting: Bariatrics

## 2021-03-05 ENCOUNTER — Encounter (INDEPENDENT_AMBULATORY_CARE_PROVIDER_SITE_OTHER): Payer: Self-pay | Admitting: Bariatrics

## 2021-03-07 NOTE — Progress Notes (Addendum)
Anesthesia Review:  PCP: Wilfred Lacy, NP  LOV with Georgiann Mccoy on 01/15/21.   Cardiologist : Chest x-ray : EKG : 07/04/20  Echo : Stress test: Cardiac Cath :  Activity level: can do a flight of stairs without difficulty  Sleep Study/ CPAP : none  Fasting Blood Sugar :      / Checks Blood Sugar -- times a day:   Blood Thinner/ Instructions /Last Dose: ASA / Instructions/ Last Dose :   81 mg Aspirin - pt has not yet received any instructions from surgeon.  Preop nurse called office and spoke with Abigail Butts in Triage and made aware pt needs instructiosn regarding aspirin.   Hgba1c-  03/11/21 - 5.8  Does not check glucose at home   DM- - prediabetes  Orders requested on 03/06/2021.  Blood pressure 152/99 at preop.  PT voices no complaints.  Denies any chestpain, dizziness or headache. PT works 3rd shift.  PT states his blood pressure is always elevated when he goes to hospital or doctors.  PT reports taking his blood pressure med this am.   NO covid test- ambulatory surgery.  No orders at preop have requested x 2.

## 2021-03-11 ENCOUNTER — Encounter (HOSPITAL_COMMUNITY): Payer: Self-pay

## 2021-03-11 ENCOUNTER — Other Ambulatory Visit: Payer: Self-pay

## 2021-03-11 ENCOUNTER — Encounter (HOSPITAL_COMMUNITY)
Admission: RE | Admit: 2021-03-11 | Discharge: 2021-03-11 | Disposition: A | Discharge status: Home / Self Care | Payer: 59 | Source: Ambulatory Visit | Attending: Surgery | Admitting: Surgery

## 2021-03-11 VITALS — BP 152/99 | HR 72 | Temp 98.3°F | Resp 16 | Ht 70.0 in | Wt 237.0 lb

## 2021-03-11 DIAGNOSIS — E119 Type 2 diabetes mellitus without complications: Secondary | ICD-10-CM | POA: Diagnosis not present

## 2021-03-11 DIAGNOSIS — Z01812 Encounter for preprocedural laboratory examination: Secondary | ICD-10-CM | POA: Diagnosis not present

## 2021-03-11 HISTORY — DX: Prediabetes: R73.03

## 2021-03-11 LAB — BASIC METABOLIC PANEL
Anion gap: 5 (ref 5–15)
BUN: 12 mg/dL (ref 8–23)
CO2: 26 mmol/L (ref 22–32)
Calcium: 9.2 mg/dL (ref 8.9–10.3)
Chloride: 108 mmol/L (ref 98–111)
Creatinine, Ser: 0.95 mg/dL (ref 0.61–1.24)
GFR, Estimated: >60 mL/min (ref >60)
Glucose, Bld: 98 mg/dL (ref 70–99)
Potassium: 4.5 mmol/L (ref 3.5–5.1)
Sodium: 139 mmol/L (ref 135–145)

## 2021-03-11 LAB — CBC
HCT: 45.3 % (ref 39.0–52.0)
Hemoglobin: 15.2 g/dL (ref 13.0–17.0)
MCH: 28.2 pg (ref 26.0–34.0)
MCHC: 33.6 g/dL (ref 30.0–36.0)
MCV: 84 fL (ref 80.0–100.0)
Platelets: 212 10*3/uL (ref 150–400)
RBC: 5.39 MIL/uL (ref 4.22–5.81)
RDW: 14.3 % (ref 11.5–15.5)
WBC: 8.8 10*3/uL (ref 4.0–10.5)
nRBC: 0 % (ref 0.0–0.2)

## 2021-03-11 LAB — HEMOGLOBIN A1C
Hgb A1c MFr Bld: 5.8 % — ABNORMAL HIGH (ref 4.8–5.6)
Mean Plasma Glucose: 120 mg/dL

## 2021-03-11 LAB — GLUCOSE, CAPILLARY: Glucose-Capillary: 103 mg/dL — ABNORMAL HIGH (ref 70–99)

## 2021-03-11 NOTE — Progress Notes (Signed)
Marvin [name]  03/11/2021   Your procedure is scheduled on:   03/29/2021.   Report to [facility] Main  Entrance   Report to admitting at    [phone]     Call this number if you have problems the morning of surgery [phone]    Remember: Do not eat food , candy gum or mints :After Midnight. You may have clear liquids from midnight until __  0430am    CLEAR LIQUID DIET   Foods Allowed                                                                       Coffee and tea, regular and decaf                              Plain Jell-O any favor except red or purple                                            Fruit ices (not with fruit pulp)                                      Iced Popsicles                                     Carbonated beverages, regular and diet                                    Cranberry, grape and apple juices Sports drinks like Gatorade Lightly seasoned clear broth or consume(fat free) Sugar   _____________________________________________________________________    BRUSH YOUR TEETH MORNING OF SURGERY AND RINSE YOUR MOUTH OUT, NO CHEWING GUM CANDY OR MINTS.     Take these medicines the morning of surgery with A SIP OF WATER:  allopurinol, amlodipine   DO NOT TAKE ANY DIABETIC MEDICATIONS DAY OF YOUR SURGERY                               You may not have any metal on your body including hair pins and              piercings  Do not wear jewelry, make-up, lotions, powders or perfumes, deodorant             Do not wear nail polish on your fingernails.  Do not shave  48 hours prior to surgery.              Men may shave face and neck.   Do not bring valuables to the hospital. [facility] IS NOT             RESPONSIBLE   FOR VALUABLES.  Contacts, dentures or  bridgework may not be worn into surgery.  Leave suitcase in the car. After surgery it may be brought to your room.     Patients discharged the day of surgery will not be  allowed to drive home. IF YOU ARE HAVING SURGERY AND GOING HOME THE SAME DAY, YOU MUST HAVE AN ADULT TO DRIVE YOU HOME AND BE WITH YOU FOR 24 HOURS. YOU MAY GO HOME BY TAXI OR UBER OR ORTHERWISE, BUT AN ADULT MUST ACCOMPANY YOU HOME AND STAY WITH YOU FOR 24 HOURS.  Name and phone number of your driver:  Special Instructions: N/A              Please read over the following fact sheets you were given: _____________________________________________________________________  [facility] - Preparing for Surgery Before surgery, you can play an important role.  Because skin is not sterile, your skin needs to be as free of germs as possible.  You can reduce the number of germs on your skin by washing with CHG (chlorahexidine gluconate) soap before surgery.  CHG is an antiseptic cleaner which kills germs and bonds with the skin to continue killing germs even after washing. Please DO NOT use if you have an allergy to CHG or antibacterial soaps.  If your skin becomes reddened/irritated stop using the CHG and inform your nurse when you arrive at Short Stay. Do not shave (including legs and underarms) for at least 48 hours prior to the first CHG shower.  You may shave your face/neck. Please follow these instructions carefully:  1.  Shower with CHG Soap the night before surgery and the  morning of Surgery.  2.  If you choose to wash your hair, wash your hair first as usual with your  normal  shampoo.  3.  After you shampoo, rinse your hair and body thoroughly to remove the  shampoo.                           4.  Use CHG as you would any other liquid soap.  You can apply chg directly  to the skin and wash                       Gently with a scrungie or clean washcloth.  5.  Apply the CHG Soap to your body ONLY FROM THE NECK DOWN.   Do not use on face/ open                           Wound or open sores. Avoid contact with eyes, ears mouth and genitals (private parts).                       Wash face,  Genitals  (private parts) with your normal soap.             6.  Wash thoroughly, paying special attention to the area where your surgery  will be performed.  7.  Thoroughly rinse your body with warm water from the neck down.  8.  DO NOT shower/wash with your normal soap after using and rinsing off  the CHG Soap.                9.  Pat yourself dry with a clean towel.            10.  Wear clean pajamas.  11.  Place clean sheets on your bed the night of your first shower and do not  sleep with pets. Day of Surgery : Do not apply any lotions/deodorants the morning of surgery.  Please wear clean clothes to the hospital/surgery center.  FAILURE TO FOLLOW THESE INSTRUCTIONS MAY RESULT IN THE CANCELLATION OF YOUR SURGERY PATIENT SIGNATURE_________________________________  NURSE SIGNATURE__________________________________  ________________________________________________________________________

## 2021-03-28 ENCOUNTER — Other Ambulatory Visit: Payer: Self-pay

## 2021-03-28 ENCOUNTER — Encounter (INDEPENDENT_AMBULATORY_CARE_PROVIDER_SITE_OTHER): Payer: Self-pay | Admitting: Bariatrics

## 2021-03-28 ENCOUNTER — Ambulatory Visit (INDEPENDENT_AMBULATORY_CARE_PROVIDER_SITE_OTHER): Payer: 59 | Admitting: Bariatrics

## 2021-03-28 VITALS — BP 133/90 | HR 68 | Temp 98.1°F | Ht 70.0 in | Wt 233.0 lb

## 2021-03-28 DIAGNOSIS — E1169 Type 2 diabetes mellitus with other specified complication: Secondary | ICD-10-CM | POA: Diagnosis not present

## 2021-03-28 DIAGNOSIS — E669 Obesity, unspecified: Secondary | ICD-10-CM | POA: Diagnosis not present

## 2021-03-28 DIAGNOSIS — Z6833 Body mass index (BMI) 33.0-33.9, adult: Secondary | ICD-10-CM

## 2021-03-28 DIAGNOSIS — Z7985 Long-term (current) use of injectable non-insulin antidiabetic drugs: Secondary | ICD-10-CM

## 2021-03-28 DIAGNOSIS — E66811 Obesity, class 1: Secondary | ICD-10-CM

## 2021-03-28 DIAGNOSIS — E782 Mixed hyperlipidemia: Secondary | ICD-10-CM

## 2021-03-28 DIAGNOSIS — E6609 Other obesity due to excess calories: Secondary | ICD-10-CM

## 2021-03-28 MED ORDER — SEMAGLUTIDE (1 MG/DOSE) 4 MG/3ML ~~LOC~~ SOPN: 1.0000 mg | PEN_INJECTOR | SUBCUTANEOUS | 0 refills | Status: DC

## 2021-03-28 NOTE — Progress Notes (Signed)
Chief Complaint:   OBESITY Marvin [name] is here to discuss his progress with his obesity treatment plan along with follow-up of his obesity related diagnoses. Marvin [name] is on the Category 2 Plan and states he is following his eating plan approximately 70% of the time. Marvin [name] states he is walking and fishing for 4 hours 4 times per week.  Today's visit was #: 20 Starting weight: 242 lbs Starting date: 07/04/2020 Today's weight: 233 lbs Today's date: 03/28/2021 Total lbs lost to date: 9 lbs Total lbs lost since last in-office visit: 0  Interim History: Marvin [name] is up 4 lbs since his last visit. He has hernia surgery tomorrow. He has not been active.  Subjective:   1. Diabetes mellitus type 2 in obese Marvin [name]) Marvin [name] is taking his medications as directed.   2. Mixed hyperlipidemia Marvin [name] is cutting back on saturated fats.  Assessment/Plan:   1. Diabetes mellitus type 2 in obese (HCC) We will refill Semaglutide 1 mg for 1 month with no refills. Good blood sugar control is important to decrease the likelihood of diabetic complications such as nephropathy, neuropathy, limb loss, blindness, coronary artery disease, and death. Intensive lifestyle modification including diet, exercise and weight loss are the first line of treatment for diabetes.   - Semaglutide, 1 MG/DOSE, 4 MG/3ML SOPN; Inject 1 mg as directed once a week.  Dispense: 3 mL; Refill: 0  2. Mixed hyperlipidemia Cardiovascular risk and specific lipid/LDL goals reviewed.  Marvin [name] will keep unhealthy saturated fats low. He can eat dairy. He will have no trans fats. We discussed several lifestyle modifications today and Marvin [name] will continue to work on diet, exercise and weight loss efforts. Orders and follow up as documented in patient record.   Counseling Intensive lifestyle modifications are the first line treatment for this issue. Dietary changes: Increase soluble fiber. Decrease simple carbohydrates. Exercise changes: Moderate  to vigorous-intensity aerobic activity 150 minutes per week if tolerated. Lipid-lowering medications: see documented in medical record.  3. Obesity, current BMI 33.5 Marvin [name] is currently in the action stage of change. As such, his goal is to continue with weight loss efforts. He has agreed to the Category 2 Plan.   Marvin [name] will continue meal planning and he will continue intentional eating. He will adhere closely to the plan.  Exercise goals:  Marvin [name] will be less active.  Behavioral modification strategies: increasing lean protein intake, decreasing simple carbohydrates, increasing vegetables, increasing water intake, decreasing eating out, no skipping meals, meal planning and cooking strategies, keeping healthy foods in the home, and planning for success.  Marvin [name] has agreed to follow-up with our clinic in 3-4 weeks. He was informed of the importance of frequent follow-up visits to maximize his success with intensive lifestyle modifications for his multiple health conditions.   Objective:   Blood pressure 133/90, pulse 68, temperature 98.1 F (36.7 C), height 5\' 10"  (1.778 m), weight 233 lb (105.7 kg), SpO2 99 %. Body mass index is 33.43 kg/m.  General: Cooperative, alert, well developed, in no acute distress. HEENT: Conjunctivae and lids unremarkable. Cardiovascular: Regular rhythm.  Lungs: Normal work of breathing. Neurologic: No focal deficits.   Lab Results  Component Value Date   CREATININE 0.95 03/11/2021   BUN 12 03/11/2021   NA 139 03/11/2021   K 4.5 03/11/2021   CL 108 03/11/2021   CO2 26 03/11/2021   Lab Results  Component Value Date   ALT 14 10/23/2020   AST 16 10/23/2020   ALKPHOS 104 10/23/2020   BILITOT  0.3 10/23/2020   Lab Results  Component Value Date   HGBA1C 5.8 (H) 03/11/2021   HGBA1C 5.9 (H) 10/23/2020   HGBA1C 6.4 05/11/2020   HGBA1C 6.3 (H) 09/30/2019   HGBA1C 6.2 03/01/2019   Lab Results  Component Value Date   INSULIN 11.7 10/23/2020    INSULIN 10.6 07/04/2020   Lab Results  Component Value Date   TSH 2.170 07/04/2020   Lab Results  Component Value Date   CHOL 170 10/23/2020   HDL 40 10/23/2020   LDLCALC 117 (H) 10/23/2020   TRIG 67 10/23/2020   CHOLHDL 4 05/11/2020   Lab Results  Component Value Date   VD25OH 42.4 10/23/2020   VD25OH 7.3 (L) 07/04/2020   Lab Results  Component Value Date   WBC 8.8 03/11/2021   HGB 15.2 03/11/2021   HCT 45.3 03/11/2021   MCV 84.0 03/11/2021   PLT 212 03/11/2021   No results found for: IRON, TIBC, FERRITIN  Attestation Statements:   Reviewed by clinician on day of visit: allergies, medications, problem list, medical history, surgical history, family history, social history, and previous encounter notes.  I, Lizbeth Bark, RMA, am acting as Location manager for [employer], DO.  I have reviewed the above documentation for accuracy and completeness, and I agree with the above. Jearld Lesch, DO

## 2021-03-29 ENCOUNTER — Ambulatory Visit (HOSPITAL_COMMUNITY)
Admission: RE | Admit: 2021-03-29 | Discharge: 2021-03-29 | Disposition: A | Discharge status: Home / Self Care | Payer: 59 | Source: Ambulatory Visit | Attending: Surgery | Admitting: Surgery

## 2021-03-29 ENCOUNTER — Ambulatory Visit (HOSPITAL_COMMUNITY): Payer: 59 | Admitting: Anesthesiology

## 2021-03-29 ENCOUNTER — Encounter (HOSPITAL_COMMUNITY)
Admission: RE | Disposition: A | Discharge status: Home / Self Care | Payer: Self-pay | Source: Ambulatory Visit | Attending: Surgery

## 2021-03-29 ENCOUNTER — Ambulatory Visit (HOSPITAL_BASED_OUTPATIENT_CLINIC_OR_DEPARTMENT_OTHER): Payer: 59 | Admitting: Anesthesiology

## 2021-03-29 ENCOUNTER — Encounter (HOSPITAL_COMMUNITY): Payer: Self-pay | Admitting: Surgery

## 2021-03-29 ENCOUNTER — Other Ambulatory Visit: Payer: Self-pay

## 2021-03-29 DIAGNOSIS — Z87891 Personal history of nicotine dependence: Secondary | ICD-10-CM | POA: Insufficient documentation

## 2021-03-29 DIAGNOSIS — K219 Gastro-esophageal reflux disease without esophagitis: Secondary | ICD-10-CM | POA: Insufficient documentation

## 2021-03-29 DIAGNOSIS — K4090 Unilateral inguinal hernia, without obstruction or gangrene, not specified as recurrent: Secondary | ICD-10-CM

## 2021-03-29 DIAGNOSIS — Z8711 Personal history of peptic ulcer disease: Secondary | ICD-10-CM | POA: Insufficient documentation

## 2021-03-29 DIAGNOSIS — E119 Type 2 diabetes mellitus without complications: Secondary | ICD-10-CM | POA: Insufficient documentation

## 2021-03-29 DIAGNOSIS — I10 Essential (primary) hypertension: Secondary | ICD-10-CM | POA: Diagnosis not present

## 2021-03-29 LAB — GLUCOSE, CAPILLARY: Glucose-Capillary: 97 mg/dL (ref 70–99)

## 2021-03-29 SURGERY — HERNIORRHAPHY, INGUINAL, ROBOT-ASSISTED, LAPAROSCOPIC
Anesthesia: General | Site: Abdomen | Laterality: Left

## 2021-03-29 MED ORDER — OXYCODONE HCL 5 MG PO TABS: 5.0000 mg | ORAL_TABLET | Freq: Four times a day (QID) | ORAL | 0 refills | Status: DC | PRN

## 2021-03-29 MED ORDER — PHENYLEPHRINE HCL (PRESSORS) 10 MG/ML IV SOLN
INTRAVENOUS | Status: AC
  Filled 2021-03-29: qty 1

## 2021-03-29 MED ORDER — DEXAMETHASONE SODIUM PHOSPHATE 10 MG/ML IJ SOLN
INTRAMUSCULAR | Status: DC | PRN
Start: 2021-03-29 — End: 2021-03-29
  Administered 2021-03-29: 4 mg via INTRAVENOUS

## 2021-03-29 MED ORDER — BUPIVACAINE LIPOSOME 1.3 % IJ SUSP
INTRAMUSCULAR | Status: AC
  Filled 2021-03-29: qty 20

## 2021-03-29 MED ORDER — LACTATED RINGERS IV SOLN: INTRAVENOUS | Status: DC

## 2021-03-29 MED ORDER — KETAMINE HCL 50 MG/5ML IJ SOSY
PREFILLED_SYRINGE | INTRAMUSCULAR | Status: AC
  Filled 2021-03-29: qty 5

## 2021-03-29 MED ORDER — CHLORHEXIDINE GLUCONATE 0.12 % MT SOLN
15.0000 mL | Freq: Once | OROMUCOSAL | Status: AC
  Administered 2021-03-29: 15 mL via OROMUCOSAL

## 2021-03-29 MED ORDER — ROCURONIUM BROMIDE 10 MG/ML (PF) SYRINGE
PREFILLED_SYRINGE | INTRAVENOUS | Status: AC
  Filled 2021-03-29: qty 10

## 2021-03-29 MED ORDER — OXYCODONE HCL 5 MG PO TABS
ORAL_TABLET | ORAL | Status: AC
  Filled 2021-03-29: qty 1

## 2021-03-29 MED ORDER — ONDANSETRON HCL 4 MG/2ML IJ SOLN
INTRAMUSCULAR | Status: AC
  Filled 2021-03-29: qty 2

## 2021-03-29 MED ORDER — ORAL CARE MOUTH RINSE: 15.0000 mL | Freq: Once | OROMUCOSAL | Status: AC

## 2021-03-29 MED ORDER — SUGAMMADEX SODIUM 200 MG/2ML IV SOLN
INTRAVENOUS | Status: DC | PRN
  Administered 2021-03-29: 200 mg via INTRAVENOUS

## 2021-03-29 MED ORDER — CHLORHEXIDINE GLUCONATE CLOTH 2 % EX PADS: 6.0000 | MEDICATED_PAD | Freq: Once | CUTANEOUS | Status: DC

## 2021-03-29 MED ORDER — FENTANYL CITRATE (PF) 250 MCG/5ML IJ SOLN
INTRAMUSCULAR | Status: AC
  Filled 2021-03-29: qty 5

## 2021-03-29 MED ORDER — HYDROMORPHONE HCL 1 MG/ML IJ SOLN: 0.2500 mg | INTRAMUSCULAR | Status: DC | PRN

## 2021-03-29 MED ORDER — ONDANSETRON HCL 4 MG/2ML IJ SOLN
INTRAMUSCULAR | Status: DC | PRN
  Administered 2021-03-29: 4 mg via INTRAVENOUS

## 2021-03-29 MED ORDER — DEXAMETHASONE SODIUM PHOSPHATE 10 MG/ML IJ SOLN
INTRAMUSCULAR | Status: AC
  Filled 2021-03-29: qty 1

## 2021-03-29 MED ORDER — SUCCINYLCHOLINE CHLORIDE 200 MG/10ML IV SOSY
PREFILLED_SYRINGE | INTRAVENOUS | Status: AC
  Filled 2021-03-29: qty 10

## 2021-03-29 MED ORDER — SCOPOLAMINE 1 MG/3DAYS TD PT72
1.0000 | MEDICATED_PATCH | TRANSDERMAL | Status: DC
  Administered 2021-03-29: 1.5 mg via TRANSDERMAL
  Filled 2021-03-29: qty 1

## 2021-03-29 MED ORDER — BUPIVACAINE LIPOSOME 1.3 % IJ SUSP
INTRAMUSCULAR | Status: DC | PRN
  Administered 2021-03-29: 20 mL

## 2021-03-29 MED ORDER — BUPIVACAINE LIPOSOME 1.3 % IJ SUSP: 20.0000 mL | Freq: Once | INTRAMUSCULAR | Status: DC

## 2021-03-29 MED ORDER — MIDAZOLAM HCL 2 MG/2ML IJ SOLN
INTRAMUSCULAR | Status: AC
  Filled 2021-03-29: qty 2

## 2021-03-29 MED ORDER — LIDOCAINE 2% (20 MG/ML) 5 ML SYRINGE
INTRAMUSCULAR | Status: DC | PRN
  Administered 2021-03-29: 60 mg via INTRAVENOUS

## 2021-03-29 MED ORDER — PHENYLEPHRINE HCL-NACL 20-0.9 MG/250ML-% IV SOLN
INTRAVENOUS | Status: DC | PRN
  Administered 2021-03-29: 30 ug/min via INTRAVENOUS

## 2021-03-29 MED ORDER — CHLORHEXIDINE GLUCONATE CLOTH 2 % EX PADS
6.0000 | MEDICATED_PAD | Freq: Once | CUTANEOUS | Status: DC
Start: 2021-03-29 — End: 2021-03-29

## 2021-03-29 MED ORDER — LACTATED RINGERS IV SOLN
INTRAVENOUS | Status: DC | PRN
Start: 2021-03-29 — End: 2021-03-29

## 2021-03-29 MED ORDER — FENTANYL CITRATE (PF) 100 MCG/2ML IJ SOLN
INTRAMUSCULAR | Status: DC | PRN
  Administered 2021-03-29: 100 ug via INTRAVENOUS
  Administered 2021-03-29: 25 ug via INTRAVENOUS
  Administered 2021-03-29: 100 ug via INTRAVENOUS
  Administered 2021-03-29: 25 ug via INTRAVENOUS

## 2021-03-29 MED ORDER — PROPOFOL 10 MG/ML IV BOLUS
INTRAVENOUS | Status: DC | PRN
  Administered 2021-03-29: 150 mg via INTRAVENOUS

## 2021-03-29 MED ORDER — ROCURONIUM BROMIDE 10 MG/ML (PF) SYRINGE
PREFILLED_SYRINGE | INTRAVENOUS | Status: DC | PRN
  Administered 2021-03-29: 10 mg via INTRAVENOUS
  Administered 2021-03-29: 80 mg via INTRAVENOUS
  Administered 2021-03-29 (×2): 20 mg via INTRAVENOUS

## 2021-03-29 MED ORDER — SODIUM CHLORIDE (PF) 0.9 % IJ SOLN
INTRAMUSCULAR | Status: DC | PRN
  Administered 2021-03-29: 10 mL

## 2021-03-29 MED ORDER — 0.9 % SODIUM CHLORIDE (POUR BTL) OPTIME
TOPICAL | Status: DC | PRN
Start: 2021-03-29 — End: 2021-03-29
  Administered 2021-03-29: 1000 mL

## 2021-03-29 MED ORDER — CEFAZOLIN SODIUM-DEXTROSE 2-4 GM/100ML-% IV SOLN
2.0000 g | INTRAVENOUS | Status: AC
  Administered 2021-03-29: 2 g via INTRAVENOUS
  Filled 2021-03-29: qty 100

## 2021-03-29 MED ORDER — PROPOFOL 10 MG/ML IV BOLUS
INTRAVENOUS | Status: AC
  Filled 2021-03-29: qty 20

## 2021-03-29 MED ORDER — ACETAMINOPHEN 500 MG PO TABS
1000.0000 mg | ORAL_TABLET | ORAL | Status: DC
  Filled 2021-03-29: qty 2

## 2021-03-29 MED ORDER — ACETAMINOPHEN 500 MG PO TABS
1000.0000 mg | ORAL_TABLET | Freq: Once | ORAL | Status: AC
  Administered 2021-03-29: 1000 mg via ORAL

## 2021-03-29 MED ORDER — LIDOCAINE HCL (PF) 2 % IJ SOLN
INTRAMUSCULAR | Status: DC | PRN
  Administered 2021-03-29: 1.5 mg/kg/h via INTRADERMAL

## 2021-03-29 MED ORDER — KETAMINE HCL 10 MG/ML IJ SOLN
INTRAMUSCULAR | Status: DC | PRN
  Administered 2021-03-29: 40 mg via INTRAVENOUS

## 2021-03-29 MED ORDER — MIDAZOLAM HCL 5 MG/5ML IJ SOLN
INTRAMUSCULAR | Status: DC | PRN
  Administered 2021-03-29: 2 mg via INTRAVENOUS

## 2021-03-29 SURGICAL SUPPLY — 58 items
APL PRP STRL LF DISP 70% ISPRP (MISCELLANEOUS) ×1
APL SWBSTK 6 STRL LF DISP (MISCELLANEOUS) ×2
APPLICATOR COTTON TIP 6 STRL (MISCELLANEOUS) ×4 IMPLANT
APPLICATOR COTTON TIP 6IN STRL (MISCELLANEOUS) ×4
BAG COUNTER SPONGE SURGICOUNT (BAG) IMPLANT
BAG SPNG CNTER NS LX DISP (BAG)
BLADE SURG 15 STRL LF DISP TIS (BLADE) ×2 IMPLANT
BLADE SURG 15 STRL SS (BLADE) ×2
CANNULA REDUC XI 12-8 STAPL (CANNULA) ×2
CANNULA REDUCER 12-8 DVNC XI (CANNULA) ×2 IMPLANT
CHLORAPREP W/TINT 26 (MISCELLANEOUS) ×3 IMPLANT
COVER MAYO STAND STRL (DRAPES) ×3 IMPLANT
COVER SURGICAL LIGHT HANDLE (MISCELLANEOUS) ×3 IMPLANT
COVER TIP SHEARS 8 DVNC (MISCELLANEOUS) ×2 IMPLANT
COVER TIP SHEARS 8MM DA VINCI (MISCELLANEOUS) ×2
DRAPE ARM DVNC X/XI (DISPOSABLE) ×6 IMPLANT
DRAPE COLUMN DVNC XI (DISPOSABLE) ×2 IMPLANT
DRAPE DA VINCI XI ARM (DISPOSABLE) ×6
DRAPE DA VINCI XI COLUMN (DISPOSABLE) ×2
ELECT REM PT RETURN 15FT ADLT (MISCELLANEOUS) ×3 IMPLANT
GLOVE SURG ENC TEXT LTX SZ8 (GLOVE) ×6 IMPLANT
GOWN STRL REUS W/TWL XL LVL3 (GOWN DISPOSABLE) ×9 IMPLANT
GRASPER SUT TROCAR 14GX15 (MISCELLANEOUS) ×3 IMPLANT
IRRIG SUCT STRYKERFLOW 2 WTIP (MISCELLANEOUS) ×2
IRRIGATION SUCT STRKRFLW 2 WTP (MISCELLANEOUS) ×2 IMPLANT
KIT BASIN OR (CUSTOM PROCEDURE TRAY) ×3 IMPLANT
KIT TURNOVER KIT A (KITS) IMPLANT
MARKER SKIN DUAL TIP RULER LAB (MISCELLANEOUS) ×3 IMPLANT
MESH 3DMAX 4X6 LT LRG (Mesh General) ×1 IMPLANT
NEEDLE HYPO 22GX1.5 SAFETY (NEEDLE) ×3 IMPLANT
OBTURATOR OPTICAL STANDARD 8MM (TROCAR) ×2
OBTURATOR OPTICAL STND 8 DVNC (TROCAR) ×1
OBTURATOR OPTICALSTD 8 DVNC (TROCAR) ×2 IMPLANT
PACK CARDIOVASCULAR III (CUSTOM PROCEDURE TRAY) ×3 IMPLANT
PAD POSITIONING PINK XL (MISCELLANEOUS) ×3 IMPLANT
SCISSORS LAP 5X35 DISP (ENDOMECHANICALS) ×1 IMPLANT
SEAL CANN UNIV 5-8 DVNC XI (MISCELLANEOUS) ×4 IMPLANT
SEAL XI 5MM-8MM UNIVERSAL (MISCELLANEOUS) ×4
SOL ANTI FOG 6CC (MISCELLANEOUS) ×2 IMPLANT
SOLUTION ANTI FOG 6CC (MISCELLANEOUS) ×1
SOLUTION ELECTROLUBE (MISCELLANEOUS) ×3 IMPLANT
SPIKE FLUID TRANSFER (MISCELLANEOUS) ×3 IMPLANT
SPONGE T-LAP 18X18 ~~LOC~~+RFID (SPONGE) ×3 IMPLANT
STAPLER CANNULA SEAL DVNC XI (STAPLE) ×2 IMPLANT
STAPLER CANNULA SEAL XI (STAPLE) ×2
SUT MNCRL AB 4-0 PS2 18 (SUTURE) ×6 IMPLANT
SUT VIC AB 2-0 SH 27 (SUTURE) ×2
SUT VIC AB 2-0 SH 27X BRD (SUTURE) ×2 IMPLANT
SUT VICRYL 0 TIES 12 18 (SUTURE) ×3 IMPLANT
SUT VLOC 180 2-0 6IN GS21 (SUTURE) IMPLANT
SYR 10ML ECCENTRIC (SYRINGE) ×3 IMPLANT
SYR 20ML LL LF (SYRINGE) ×3 IMPLANT
TAPE STRIPS DRAPE STRL (GAUZE/BANDAGES/DRESSINGS) ×3 IMPLANT
TOWEL OR 17X26 10 PK STRL BLUE (TOWEL DISPOSABLE) ×3 IMPLANT
TOWEL OR NON WOVEN STRL DISP B (DISPOSABLE) ×3 IMPLANT
TROCAR ADV FIXATION 12X100MM (TROCAR) IMPLANT
TROCAR BLADELESS OPT 5 100 (ENDOMECHANICALS) ×3 IMPLANT
TUBING INSUFFLATION 10FT LAP (TUBING) ×3 IMPLANT

## 2021-03-29 NOTE — Interval H&P Note (Signed)
History and Physical Interval Note:  03/29/2021 12:14 PM  Marvin [name]  has presented today for surgery, with the diagnosis of LEFT INGUINAL HERNIA.  The various methods of treatment have been discussed with the patient and family. After consideration of risks, benefits and other options for treatment, the patient has consented to  Procedure(s): XI ROBOTIC ASSISTED LEFT INGUINAL HERNIA REPAIR (Left) as a surgical intervention.  The patient's history has been reviewed, patient examined, no change in status, stable for surgery.  I have reviewed the patient's chart and labs.  Questions were answered to the patient's satisfaction.     Pedro Earls

## 2021-03-29 NOTE — Anesthesia Procedure Notes (Signed)
Procedure Name: Intubation Date/Time: 03/29/2021 1:27 PM Performed by: Cleda Daub, CRNA Pre-anesthesia Checklist: Patient identified, Emergency Drugs available, Suction available and Patient being monitored Patient Re-evaluated:Patient Re-evaluated prior to induction Oxygen Delivery Method: Circle system utilized Preoxygenation: Pre-oxygenation with 100% oxygen Induction Type: IV induction Ventilation: Mask ventilation without difficulty Laryngoscope Size: Mac and 4 Grade View: Grade I Tube type: Oral Tube size: 7.5 mm Number of attempts: 1 Airway Equipment and Method: Stylet and Oral airway Placement Confirmation: ETT inserted through vocal cords under direct vision, positive ETCO2 and breath sounds checked- equal and bilateral Secured at: 23 cm Tube secured with: Tape Dental Injury: Teeth and Oropharynx as per pre-operative assessment

## 2021-03-29 NOTE — Anesthesia Preprocedure Evaluation (Addendum)
Anesthesia Evaluation  Patient identified by MRN, date of birth, ID band Patient awake    Reviewed: Allergy & Precautions, NPO status , Patient's Chart, lab work & pertinent test results  History of Anesthesia Complications Negative for: history of anesthetic complications  Airway Mallampati: II  TM Distance: >3 FB Neck ROM: Full    Dental  (+) Missing,    Pulmonary former smoker,    Pulmonary exam normal        Cardiovascular hypertension, Normal cardiovascular exam     Neuro/Psych negative neurological ROS  negative psych ROS   GI/Hepatic PUD, GERD  Medicated,Remote hx of EtOH and substance abuse (none in 23 years) LEFT INGUINAL HERNIA   Endo/Other  diabetes, Type 2  Renal/GU negative Renal ROS  negative genitourinary   Musculoskeletal  (+) Arthritis ,   Abdominal   Peds  Hematology negative hematology ROS (+)   Anesthesia Other Findings Day of surgery medications reviewed with patient.  Reproductive/Obstetrics negative OB ROS                            Anesthesia Physical Anesthesia Plan  ASA: 2  Anesthesia Plan: General   Post-op Pain Management: Tylenol PO (pre-op)   Induction: Intravenous  PONV Risk Score and Plan: 3 and Treatment may vary due to age or medical condition, Ondansetron, Dexamethasone and Midazolam  Airway Management Planned: Oral ETT  Additional Equipment: None  Intra-op Plan:   Post-operative Plan: Extubation in OR  Informed Consent: I have reviewed the patients History and Physical, chart, labs and discussed the procedure including the risks, benefits and alternatives for the proposed anesthesia with the patient or authorized representative who has indicated his/her understanding and acceptance.     Dental advisory given  Plan Discussed with: CRNA  Anesthesia Plan Comments:        Anesthesia Quick Evaluation

## 2021-03-29 NOTE — H&P (Signed)
REFERRING PHYSICIAN: Nche, Bonna Gains, NP  PROVIDER: Katha Cabal, MD  MRN: [id number] DOB: July 30, 1950 DATE OF ENCOUNTER: 12/21/2020  Subjective   Chief Complaint: New Consultation (Ing. Hernia )   History of Present Illness: Marvin [name] is a 71 y.o. male who is seen today as an office consultation at the request of Dr. Elease Etienne for evaluation of New Consultation (Ing. Hernia ) .   [name] works for [employer] and lifts a lot of heavy boxes. He has had a previous umbilical hernia repair x2 and he has developed this left inguinal hernia that he is says he can feel bowel gas go through when he has good flatus. This likely has is sigmoid in it.  I discussed open left inguinal hernia repair as well as robotic Tapp repair with him in some detail and gave him booklets on both. He wants to think about this. In the meantime organ to restrict his lifting to 40 pounds. It may be that he will get this set up in the new year.  Review of Systems: See HPI as well for other ROS.  ROS   Medical History: Past Medical History:  Diagnosis Date   Arthritis   GERD (gastroesophageal reflux disease)   Hypertension   Substance abuse (CMS-HCC)   Patient Active Problem List  Diagnosis   Acid reflux   Chronic bilateral low back pain without sciatica   Diverticular disease of colon   Gout   Hyperuricemia   HTN (hypertension)   Hyperlipidemia   Primary osteoarthritis of right hip   Status post total replacement of right hip   Type 2 diabetes mellitus without complication, without long-term current use of insulin (CMS-HCC)   Ventral hernia without obstruction or gangrene   Vitamin D deficiency   Past Surgical History:  Procedure Laterality Date   HERNIA REPAIR   JOINT REPLACEMENT   Knee Surgery   Rotator Cuff Surgery    Allergies  Allergen Reactions   Buprenorphine Hcl Itching   Morphine Itching   Current Outpatient Medications on File Prior to Visit  Medication Sig  Dispense Refill   aspirin 81 MG EC tablet Take by mouth   allopurinoL (ZYLOPRIM) 100 MG tablet   amLODIPine (NORVASC) 10 MG tablet   ergocalciferol, vitamin D2, 1,250 mcg (50,000 unit) capsule   fluticasone propionate (FLONASE) 50 mcg/actuation nasal spray   OZEMPIC 0.25 mg or 0.5 mg(2 mg/1.5 mL) pen injector   simvastatin (ZOCOR) 20 MG tablet   No current facility-administered medications on file prior to visit.   History reviewed. No pertinent family history.   Social History   Tobacco Use  Smoking Status Former Smoker  Smokeless Tobacco Never Used    Social History   Socioeconomic History   Marital status: Married  Tobacco Use   Smoking status: Former Smoker   Smokeless tobacco: Never Used  Building services engineer Use: Never used  Substance and Sexual Activity   Alcohol use: Not Currently  Comment: Quit 05/1998   Drug use: Not Currently  Comment: Quit - cocaine/marijuana 05/1998   Objective:   Vitals:  12/21/20 1629  BP: 122/82  Pulse: 64  Temp: 36.7 C (98.1 F)  SpO2: 96%  Weight: (!) 106.5 kg (234 lb 12.8 oz)  Height: 179.1 cm (5' 10.5")   Body mass index is 33.21 kg/m.  Physical Exam General: A muscular well-developed African-American man in no acute distress HEENT : Unremarkable Chest: Not examined Heart: Not examined Breast: Not examined Abdomen: Muscular with  prior umbilical hernia repair GU left inguinal hernia with no right inguinal hernia. Testes unremarkable Rectal not performed Extremities good range of motion with prior right hip and right knee repair Neuro alert and oriented x3. Motor and sensory function grossly intact  Labs, Imaging and Diagnostic Testing: Nothing to review  Assessment and Plan:  Diagnoses and all orders for this visit:  Left inguinal hernia-plan robotic LIH --open if necessary    Left inguinal hernia. We will give him a note to limit his lifting to about 40 pounds. He may will call and want to schedule this in the  first of next year. I gave him booklets on both open and robotic tap. I think he would favor a robotic repair.   Cleola Perryman Charna Busman, MD    Electronically signed by Katha Cabal, MD at

## 2021-03-29 NOTE — Anesthesia Postprocedure Evaluation (Signed)
Anesthesia Post Note  Patient: Marvin [name]  Procedure(s) Performed: XI ROBOTIC ASSISTED LEFT INGUINAL HERNIA REPAIR (Left: Abdomen)     Patient location during evaluation: PACU Anesthesia Type: General Level of consciousness: awake and alert Pain management: pain level controlled Vital Signs Assessment: post-procedure vital signs reviewed and stable Respiratory status: spontaneous breathing, nonlabored ventilation and respiratory function stable Cardiovascular status: blood pressure returned to baseline and stable Postop Assessment: no apparent nausea or vomiting Anesthetic complications: no   No notable events documented.  Last Vitals:  Vitals:   03/29/21 1715 03/29/21 1730  BP: 124/80 132/78  Pulse: 79 79  Resp: 17 16  Temp:    SpO2: 95% 91%    Last Pain:  Vitals:   03/29/21 1730  TempSrc:   PainSc: 0-No pain                 Lynda Rainwater

## 2021-03-29 NOTE — Transfer of Care (Signed)
Immediate Anesthesia Transfer of Care Note  Patient: Marvin [name]  Procedure(s) Performed: XI ROBOTIC ASSISTED LEFT INGUINAL HERNIA REPAIR (Left: Abdomen)  Patient Location: PACU  Anesthesia Type:General  Level of Consciousness: awake, alert , oriented and patient cooperative  Airway & Oxygen Therapy: Patient Spontanous Breathing and Patient connected to face mask oxygen  Post-op Assessment: Report given to RN and Post -op Vital signs reviewed and stable  Post vital signs: Reviewed and stable  Last Vitals:  Vitals Value Taken Time  BP 123/76 03/29/21 1700  Temp    Pulse 79 03/29/21 1706  Resp 18 03/29/21 1706  SpO2 90 % 03/29/21 1706  Vitals shown include unvalidated device data.  Last Pain:  Vitals:   03/29/21 1015  TempSrc:   PainSc: 4       Patients Stated Pain Goal: 3 (03/29/21 1015)  Complications: No notable events documented.

## 2021-03-29 NOTE — Op Note (Signed)
Marvin [name]  [birth year]   03/29/2021    PCP:  Flossie Buffy, NP   Surgeon: Kaylyn Lim, MD, FACS  Asst:  none  Anes:  general  Preop Dx: Left inguinal hernia Postop Dx: Left sliding indirect inguinal hernia  Procedure: Xi robotic left inguinal hernia repair Location Surgery: [facility] 2 Complications: None noted  EBL:   minimal cc  Drains: none  Description of Procedure:  The patient was taken to OR 2 .  After anesthesia was administered and the patient was prepped  with chloroprep and betadine on the scrotum  and a timeout was performed.  A Foley catheter was in place.  Access to the abdomen was achieved through the left upper quadrant with a 5 mm Optiview.  Patient had umbilical hernia repair x2.  The hernia appeared to involve the sigmoid colon which was down into this what appeared to be a sliding inguinal hernia.  The mesh covered the area where I was in a place my lateral port on the right I went ahead and placed a 12 on the right of the umbilicus and 8 in the mid to the left of midline and 8 on the left side.  I docked the robot from the left and used arms to 3 and 4.  I injected some Exparel around the left anterior superior iliac spine and marked a site for creation of flaps.  Once the robot was docked and the fenestrated bipolar was in the second arm and the monopolar scissors were in the fourth arm I then began the dissection from the midline incising the preperitoneal there.  There is a transverse plane separating the visceral and parietal planes and I kept the flap thin laterally and went to where I could see the muscles medially.  I carried the dissection the midline down to the pubis.  This other transverse flap was a more little more developed.  However the hernia I could manipulate in from the outside and Rod had reduced it and it was appear to be stuck up inside and I dissected that away and made that free.  I then worked on the hernia sac stripping and off the cord  structures and reducing it..  From the peritoneal side it appeared that the sac was free and reduced and cord so structures which I preserved the vessels and the vas were intact.  I created more of a space laterally.  After you had inserted a piece of Bard 3D max mesh at the beginning of the case and I placed it medially and tacked down along the pubis.  I then to tacked it superiorly and then laterally.  I then closed the flaps with V-Loc sutures from medial to lateral.  I also tucked it in and it appeared to be without buckling and I closed this small area posteriorly on the flap that had an opening where the slider had been.  Both of these were closed with VA V locks.  We surveyed the abdomen everything appeared to be in order.  The 12 mm port was closed with a single 0 Vicryl approximate approximating the what appeared to be pariah Tex mesh or woven polyester.  Port sites were injected with Exparel.  Wounds were closed with 4-0 Monocryl and with Dermabond  The patient tolerated the procedure well and was taken to the PACU in stable condition.     Matt B. Hassell Done, [place], [facility], [place]

## 2021-03-31 ENCOUNTER — Encounter (INDEPENDENT_AMBULATORY_CARE_PROVIDER_SITE_OTHER): Payer: Self-pay | Admitting: Bariatrics

## 2021-04-25 ENCOUNTER — Encounter (INDEPENDENT_AMBULATORY_CARE_PROVIDER_SITE_OTHER): Payer: Self-pay | Admitting: Bariatrics

## 2021-04-25 ENCOUNTER — Other Ambulatory Visit: Payer: Self-pay

## 2021-04-25 ENCOUNTER — Ambulatory Visit (INDEPENDENT_AMBULATORY_CARE_PROVIDER_SITE_OTHER): Payer: 59 | Admitting: Bariatrics

## 2021-04-25 VITALS — BP 134/83 | HR 75 | Temp 97.5°F | Ht 70.0 in | Wt 233.0 lb

## 2021-04-25 DIAGNOSIS — Z6834 Body mass index (BMI) 34.0-34.9, adult: Secondary | ICD-10-CM

## 2021-04-25 DIAGNOSIS — E6609 Other obesity due to excess calories: Secondary | ICD-10-CM

## 2021-04-25 DIAGNOSIS — E782 Mixed hyperlipidemia: Secondary | ICD-10-CM

## 2021-04-25 DIAGNOSIS — E669 Obesity, unspecified: Secondary | ICD-10-CM

## 2021-04-25 DIAGNOSIS — E1169 Type 2 diabetes mellitus with other specified complication: Secondary | ICD-10-CM

## 2021-04-25 DIAGNOSIS — Z7985 Long-term (current) use of injectable non-insulin antidiabetic drugs: Secondary | ICD-10-CM

## 2021-04-25 DIAGNOSIS — Z6833 Body mass index (BMI) 33.0-33.9, adult: Secondary | ICD-10-CM

## 2021-04-25 MED ORDER — SEMAGLUTIDE (1 MG/DOSE) 4 MG/3ML ~~LOC~~ SOPN: 1.0000 mg | PEN_INJECTOR | SUBCUTANEOUS | 0 refills | Status: DC

## 2021-04-25 NOTE — Progress Notes (Signed)
? ? ? ?Chief Complaint:  ? ?OBESITY ?Marvin [name] is here to discuss his progress with his obesity treatment plan along with follow-up of his obesity related diagnoses. Marvin [name] is on the Category 2 Plan and states he is following his eating plan approximately 70% of the time. Marvin [name] states he is doing 0 minutes 0 times per week. ? ?Today's visit was #: 14 ?Starting weight: 242 lbs ?Starting date: 07/04/2020 ?Today's weight: 233 lbs ?Today's date: 04/25/2021 ?Total lbs lost to date: 9 lbs ?Total lbs lost since last in-office visit: 0 ? ?Interim History: Marvin [name] weight is the same as his last visit. He had his hernia surgery in the interim.  ? ?Subjective:  ? ?1. Diabetes mellitus type 2 in obese [facility]) ?Marvin [name] is taking Ozempic currently.  ? ?2. Mixed hyperlipidemia ?Marvin [name] is currently taking Zocor.  ? ?Assessment/Plan:  ? ?1. Diabetes mellitus type 2 in obese [facility]) ?We will refill Ozempic 1 mg for 1 month with no refills Good blood sugar control is important to decrease the likelihood of diabetic complications such as nephropathy, neuropathy, limb loss, blindness, coronary artery disease, and death. Intensive lifestyle modification including diet, exercise and weight loss are the first line of treatment for diabetes.  ? ?- Semaglutide, 1 MG/DOSE, 4 MG/3ML SOPN; Inject 1 mg as directed once a week.  Dispense: 3 mL; Refill: 0 ? ?2. Mixed hyperlipidemia ?Cardiovascular risk and specific lipid/LDL goals reviewed.  Marvin [name] will continue Zocor. We discussed several lifestyle modifications today and Marvin [name] will continue to work on diet, exercise and weight loss efforts. Orders and follow up as documented in patient record.  ? ?Counseling ?Intensive lifestyle modifications are the first line treatment for this issue. ?Dietary changes: Increase soluble fiber. Decrease simple carbohydrates. ?Exercise changes: Moderate to vigorous-intensity aerobic activity 150 minutes per week if tolerated. ?Lipid-lowering medications: see  documented in medical record. ? ?3. Obesity, current BMI 33.5 ?Marvin [name] is currently in the action stage of change. As such, his goal is to continue with weight loss efforts. He has agreed to the Category 2 Plan.  ? ?Marvin [name] will continue meal planning and he will continue intentional eating. He will adhere 80-90% to the plan. He will control portion sizes.  ? ?Exercise goals:  Marvin [name] is walking and he will increase exercise per his sugar. He will count steps 12,000 daily.  ? ?Behavioral modification strategies: increasing lean protein intake, decreasing simple carbohydrates, increasing vegetables, increasing water intake, decreasing eating out, no skipping meals, meal planning and cooking strategies, keeping healthy foods in the home, and planning for success. ? ?Marvin [name] has agreed to follow-up with our clinic in 4 weeks. He was informed of the importance of frequent follow-up visits to maximize his success with intensive lifestyle modifications for his multiple health conditions.  ? ?Objective:  ? ?Blood pressure 134/83, pulse 75, temperature (!) 97.5 ?F (36.4 ?C), height 5\' 10"  (1.778 m), weight 233 lb (105.7 kg), SpO2 99 %. ?Body mass index is 33.43 kg/m?. ? ?General: Cooperative, alert, well developed, in no acute distress. ?HEENT: Conjunctivae and lids unremarkable. ?Cardiovascular: Regular rhythm.  ?Lungs: Normal work of breathing. ?Neurologic: No focal deficits.  ? ?Lab Results  ?Component Value Date  ? CREATININE 0.95 03/11/2021  ? BUN 12 03/11/2021  ? NA 139 03/11/2021  ? K 4.5 03/11/2021  ? CL 108 03/11/2021  ? CO2 26 03/11/2021  ? ?Lab Results  ?Component Value Date  ? ALT 14 10/23/2020  ? AST 16 10/23/2020  ? ALKPHOS 104 10/23/2020  ? BILITOT  0.3 10/23/2020  ? ?Lab Results  ?Component Value Date  ? HGBA1C 5.8 (H) 03/11/2021  ? HGBA1C 5.9 (H) 10/23/2020  ? HGBA1C 6.4 05/11/2020  ? HGBA1C 6.3 (H) 09/30/2019  ? HGBA1C 6.2 03/01/2019  ? ?Lab Results  ?Component Value Date  ? INSULIN 11.7 10/23/2020  ?  INSULIN 10.6 07/04/2020  ? ?Lab Results  ?Component Value Date  ? TSH 2.170 07/04/2020  ? ?Lab Results  ?Component Value Date  ? CHOL 170 10/23/2020  ? HDL 40 10/23/2020  ? LDLCALC 117 (H) 10/23/2020  ? TRIG 67 10/23/2020  ? CHOLHDL 4 05/11/2020  ? ?Lab Results  ?Component Value Date  ? VD25OH 42.4 10/23/2020  ? VD25OH 7.3 (L) 07/04/2020  ? ?Lab Results  ?Component Value Date  ? WBC 8.8 03/11/2021  ? HGB 15.2 03/11/2021  ? HCT 45.3 03/11/2021  ? MCV 84.0 03/11/2021  ? PLT 212 03/11/2021  ? ?No results found for: IRON, TIBC, FERRITIN ? ?Attestation Statements:  ? ?Reviewed by clinician on day of visit: allergies, medications, problem list, medical history, surgical history, family history, social history, and previous encounter notes. ? ?I, Lizbeth Bark, RMA, am acting as transcriptionist for [employer], DO. ? ?I have reviewed the above documentation for accuracy and completeness, and I agree with the above. Jearld Lesch, DO ? ?

## 2021-04-26 ENCOUNTER — Encounter (INDEPENDENT_AMBULATORY_CARE_PROVIDER_SITE_OTHER): Payer: Self-pay | Admitting: Bariatrics

## 2021-04-26 DIAGNOSIS — E6609 Other obesity due to excess calories: Secondary | ICD-10-CM | POA: Insufficient documentation

## 2021-05-13 ENCOUNTER — Encounter: Payer: Self-pay | Admitting: Nurse Practitioner

## 2021-05-13 ENCOUNTER — Ambulatory Visit (INDEPENDENT_AMBULATORY_CARE_PROVIDER_SITE_OTHER): Payer: Medicare HMO | Admitting: Nurse Practitioner

## 2021-05-13 VITALS — BP 116/80 | HR 68 | Temp 97.4°F | Ht 70.0 in | Wt 242.8 lb

## 2021-05-13 DIAGNOSIS — M791 Myalgia, unspecified site: Secondary | ICD-10-CM

## 2021-05-13 DIAGNOSIS — T466X5A Adverse effect of antihyperlipidemic and antiarteriosclerotic drugs, initial encounter: Secondary | ICD-10-CM | POA: Diagnosis not present

## 2021-05-13 DIAGNOSIS — I10 Essential (primary) hypertension: Secondary | ICD-10-CM | POA: Diagnosis not present

## 2021-05-13 DIAGNOSIS — E790 Hyperuricemia without signs of inflammatory arthritis and tophaceous disease: Secondary | ICD-10-CM

## 2021-05-13 DIAGNOSIS — Z125 Encounter for screening for malignant neoplasm of prostate: Secondary | ICD-10-CM | POA: Diagnosis not present

## 2021-05-13 DIAGNOSIS — E782 Mixed hyperlipidemia: Secondary | ICD-10-CM | POA: Diagnosis not present

## 2021-05-13 LAB — LIPID PANEL
Cholesterol: 182 mg/dL (ref 0–200)
HDL: 40.8 mg/dL (ref >39.00)
LDL Cholesterol: 120 mg/dL — ABNORMAL HIGH (ref 0–99)
NonHDL: 140.91
Total CHOL/HDL Ratio: 4
Triglycerides: 105 mg/dL (ref 0.0–149.0)
VLDL: 21 mg/dL (ref 0.0–40.0)

## 2021-05-13 LAB — URIC ACID: Uric Acid, Serum: 7.2 mg/dL (ref 4.0–7.8)

## 2021-05-13 LAB — HEPATIC FUNCTION PANEL
ALT: 14 U/L (ref 0–53)
AST: 18 U/L (ref 0–37)
Albumin: 4.7 g/dL (ref 3.5–5.2)
Alkaline Phosphatase: 82 U/L (ref 39–117)
Bilirubin, Direct: 0.1 mg/dL (ref 0.0–0.3)
Total Bilirubin: 0.5 mg/dL (ref 0.2–1.2)
Total Protein: 7.4 g/dL (ref 6.0–8.3)

## 2021-05-13 LAB — CK: Total CK: 220 U/L (ref 7–232)

## 2021-05-13 MED ORDER — SIMVASTATIN 20 MG PO TABS: 20.0000 mg | ORAL_TABLET | ORAL | 3 refills | Status: DC

## 2021-05-13 MED ORDER — ALLOPURINOL 100 MG PO TABS: ORAL_TABLET | ORAL | 3 refills | Status: DC

## 2021-05-13 MED ORDER — AMLODIPINE BESYLATE 10 MG PO TABS: 10.0000 mg | ORAL_TABLET | Freq: Every day | ORAL | 3 refills | Status: DC

## 2021-05-13 NOTE — Assessment & Plan Note (Signed)
No acute gout exacerabtion in 43months ?Current use of allopurinol, refill sent ?Repeat uric acid ?

## 2021-05-13 NOTE — Assessment & Plan Note (Signed)
BP at goal with amlodipine ?No adverse side effects ?BP Readings from Last 3 Encounters:  ?05/13/21 116/80  ?04/25/21 134/83  ?03/29/21 132/78  ? ?BMP complete 02/2021:normal ?Maintain med dose ?Refill sent ? ?

## 2021-05-13 NOTE — Progress Notes (Signed)
? ?             Established Patient Visit ? ?Patient: Marvin [name]   DOB: 1950-09-13   71 y.o. Male  MRN: [id number] ?Visit Date: 05/13/2021 ? ?Subjective:  ?  ?Chief Complaint  ?Patient presents with  ? Follow-up  ?  6 month f/u on DM, HTN, and cholesterol ?Pt is fasting. ?Pt had diabetic eye exam in June 2022 ?Will get shingles vaccine from retail pharmacy due to insurance  ? ?HPI ?HTN (hypertension) ?BP at goal with amlodipine ?No adverse side effects ?BP Readings from Last 3 Encounters:  ?05/13/21 116/80  ?04/25/21 134/83  ?03/29/21 132/78  ? ?BMP complete 02/2021:normal ?Maintain med dose ?Refill sent ? ? ?Hyperuricemia ?No acute gout exacerabtion in 45months ?Current use of allopurinol, refill sent ?Repeat uric acid ? ?Hyperlipidemia ?reports myalgia and fatigue with atorvastatin 3x/week. Symptoms resolved when dose is decrease to 2x/week. ?Maintain current dose ?Repeat lipid panel, hepatic panel, and CK ? ?Wt Readings from Last 3 Encounters:  ?05/13/21 242 lb 12.8 oz (110.1 kg)  ?04/25/21 233 lb (105.7 kg)  ?03/29/21 233 lb 0.4 oz (105.7 kg)  ?  ?Reviewed medical, surgical, and social history today ? ?Medications: ?Outpatient Medications Prior to Visit  ?Medication Sig Note  ? amoxicillin (AMOXIL) 500 MG capsule Take 2,000 mg by mouth See admin instructions. 1 hr prior before dental procedure 05/13/2021: Only when he has dentist appointments  ? aspirin EC 81 MG tablet Take 1 tablet (81 mg total) by mouth 2 (two) times daily. (Patient taking differently: Take 81 mg by mouth daily.)   ? colchicine 0.6 MG tablet Take 1.2 mg by mouth daily as needed (gout).   ? cyclobenzaprine (FLEXERIL) 10 MG tablet TAKE ONE-HALF TABLET BY MOUTH EVERY NIGHT AT BEDTIME AS NEEDED FOR MUSCLE SPASMS   ? fluticasone (FLONASE) 50 MCG/ACT nasal spray SPRAY TWO SPRAYS IN EACH NOSTRIL ONCE DAILY (Patient taking differently: Place 2 sprays into both nostrils at bedtime. SPRAY TWO SPRAYS IN EACH NOSTRIL ONCE DAILY)   ? oxyCODONE (OXY  IR/ROXICODONE) 5 MG immediate release tablet Take 1 tablet (5 mg total) by mouth every 6 (six) hours as needed for severe pain.   ? Semaglutide, 1 MG/DOSE, 4 MG/3ML SOPN Inject 1 mg as directed once a week.   ? Vitamin D, Ergocalciferol, (DRISDOL) 1.25 MG (50000 UNIT) CAPS capsule Take 1 capsule (50,000 Units total) by mouth every 7 (seven) days. (Patient taking differently: Take 50,000 Units by mouth every Thursday.)   ? [DISCONTINUED] allopurinol (ZYLOPRIM) 100 MG tablet TAKE ONE TABLET BY MOUTH TWICE A DAY ** NEEDS TO SCHEDULE FOLLOW UP APPOINTMENT**   ? [DISCONTINUED] amLODipine (NORVASC) 10 MG tablet Take 1 tablet (10 mg total) by mouth daily.   ? [DISCONTINUED] simvastatin (ZOCOR) 20 MG tablet Take 1 tablet (20 mg total) by mouth 3 (three) times a week. (Patient taking differently: Take 20 mg by mouth 2 (two) times a week.)   ? traMADol (ULTRAM) 50 MG tablet Take 1 tablet (50 mg total) by mouth 2 (two) times daily as needed. (Patient not taking: Reported on 05/13/2021)   ? ?No facility-administered medications prior to visit.  ? ?Reviewed past medical and social history.  ? ?ROS per HPI above ? ? ?   ?Objective:  ?BP 116/80 (BP Location: Left Arm, Patient Position: Sitting, Cuff Size: Large)   Pulse 68   Temp (!) 97.4 ?F (36.3 ?C) (Temporal)   Ht 5\' 10"  (1.778 m)   Wt 242 lb  12.8 oz (110.1 kg)   SpO2 98%   BMI 34.84 kg/m?  ? ?  ? ?Physical Exam ?Cardiovascular:  ?   Rate and Rhythm: Normal rate and regular rhythm.  ?   Pulses: Normal pulses.  ?   Heart sounds: Normal heart sounds.  ?Pulmonary:  ?   Effort: Pulmonary effort is normal.  ?   Breath sounds: Normal breath sounds.  ?Musculoskeletal:  ?   Right lower leg: No edema.  ?   Left lower leg: No edema.  ?Neurological:  ?   Mental Status: He is alert and oriented to person, place, and time.  ?  ?No results found for any visits on 05/13/21. ?   ?Assessment & Plan:  ?  ?Problem List Items Addressed This Visit   ? ?  ? Cardiovascular and Mediastinum  ? HTN  (hypertension)  ?  BP at goal with amlodipine ?No adverse side effects ?BP Readings from Last 3 Encounters:  ?05/13/21 116/80  ?04/25/21 134/83  ?03/29/21 132/78  ? ?BMP complete 02/2021:normal ?Maintain med dose ?Refill sent ? ?  ?  ? Relevant Medications  ? amLODipine (NORVASC) 10 MG tablet  ? simvastatin (ZOCOR) 20 MG tablet  ?  ? Other  ? Hyperlipidemia  ?  reports myalgia and fatigue with atorvastatin 3x/week. Symptoms resolved when dose is decrease to 2x/week. ?Maintain current dose ?Repeat lipid panel, hepatic panel, and CK ?  ?  ? Relevant Medications  ? amLODipine (NORVASC) 10 MG tablet  ? simvastatin (ZOCOR) 20 MG tablet  ? Other Relevant Orders  ? Lipid panel  ? Hepatic function panel  ? Hyperuricemia  ?  No acute gout exacerabtion in 2months ?Current use of allopurinol, refill sent ?Repeat uric acid ?  ?  ? Relevant Medications  ? allopurinol (ZYLOPRIM) 100 MG tablet  ? Other Relevant Orders  ? Uric acid  ? CK  ? ?Other Visit Diagnoses   ? ? Essential hypertension    -  Primary  ? Relevant Medications  ? amLODipine (NORVASC) 10 MG tablet  ? simvastatin (ZOCOR) 20 MG tablet  ? Myalgia due to statin      ? Prostate cancer screening      ? Relevant Orders  ? PSA  ? ?  ? ?Return in about 6 months (around 11/13/2021) for DM and HTN, hyperlipidemia (fasting). ? ?  ? ?Wilfred Lacy, NP ? ? ?

## 2021-05-13 NOTE — Assessment & Plan Note (Signed)
reports myalgia and fatigue with atorvastatin 3x/week. Symptoms resolved when dose is decrease to 2x/week. ?Maintain current dose ?Repeat lipid panel, hepatic panel, and CK ?

## 2021-05-13 NOTE — Patient Instructions (Signed)
Go to lab for blood draw.

## 2021-05-14 LAB — PSA: PSA: 1.26 ng/mL (ref 0.10–4.00)

## 2021-05-23 ENCOUNTER — Encounter (INDEPENDENT_AMBULATORY_CARE_PROVIDER_SITE_OTHER): Payer: Self-pay | Admitting: Bariatrics

## 2021-05-23 ENCOUNTER — Ambulatory Visit (INDEPENDENT_AMBULATORY_CARE_PROVIDER_SITE_OTHER): Payer: 59 | Admitting: Bariatrics

## 2021-05-23 VITALS — BP 128/86 | HR 75 | Temp 97.9°F | Ht 70.0 in | Wt 234.0 lb

## 2021-05-23 DIAGNOSIS — I10 Essential (primary) hypertension: Secondary | ICD-10-CM

## 2021-05-23 DIAGNOSIS — Z6833 Body mass index (BMI) 33.0-33.9, adult: Secondary | ICD-10-CM

## 2021-05-23 DIAGNOSIS — E6609 Other obesity due to excess calories: Secondary | ICD-10-CM

## 2021-05-23 DIAGNOSIS — Z7985 Long-term (current) use of injectable non-insulin antidiabetic drugs: Secondary | ICD-10-CM

## 2021-05-23 DIAGNOSIS — E669 Obesity, unspecified: Secondary | ICD-10-CM | POA: Diagnosis not present

## 2021-05-23 DIAGNOSIS — E1169 Type 2 diabetes mellitus with other specified complication: Secondary | ICD-10-CM

## 2021-05-23 MED ORDER — SEMAGLUTIDE (1 MG/DOSE) 4 MG/3ML ~~LOC~~ SOPN: 1.0000 mg | PEN_INJECTOR | SUBCUTANEOUS | 0 refills | Status: DC

## 2021-05-23 NOTE — Progress Notes (Signed)
? ? ? ?Chief Complaint:  ? ?OBESITY ?Marvin [name] is here to discuss his progress with his obesity treatment plan along with follow-up of his obesity related diagnoses. Marvin [name] is on the Category 2 Plan and states he is following his eating plan approximately 60% of the time. Marvin [name] states he is doing 12,000 steps 4 times per week. ? ?Today's visit was #: 15 ?Starting weight: 242 lbs ?Starting date: 07/04/2020 ?Today's weight: 234 lbs ?Today's date:05/23/2021 ?Total lbs lost to date: 8 lbs ?Total lbs lost since last in-office visit: 0 ? ?Interim History: Marvin [name] is up 1 lb since his last visit.  ? ?Subjective:  ? ?1. Diabetes mellitus type 2 in obese [facility]) ?Marvin [name] is taking his medications as directed.  ? ?2. Primary hypertension ?Marvin [name] is reasonably well controlled. His last blood pressure was 134/83. ? ?Assessment/Plan:  ? ?1. Diabetes mellitus type 2 in obese [facility]) ?We will refill Semaglutide 1 mg for 1 month with no refills. Good blood sugar control is important to decrease the likelihood of diabetic complications such as nephropathy, neuropathy, limb loss, blindness, coronary artery disease, and death. Intensive lifestyle modification including diet, exercise and weight loss are the first line of treatment for diabetes.  ? ?- Semaglutide, 1 MG/DOSE, 4 MG/3ML SOPN; Inject 1 mg as directed once a week.  Dispense: 3 mL; Refill: 0 ? ?2. Primary hypertension ?Marvin [name] will continue his medications. He is working on healthy weight loss and exercise to improve blood pressure control. We will watch for signs of hypotension as he continues his lifestyle modifications. ? ?3. Obesity, current BMI 33.7 ?Marvin [name] is currently in the action stage of change. As such, his goal is to continue with weight loss efforts. He has agreed to the Category 2 Plan.  ? ?Marvin [name] will continue meal planning and he will continue intentional eating.  ? ?Exercise goals:  Marvin [name] is being more active and feeling good.  ? ?Behavioral modification  strategies: increasing lean protein intake, decreasing simple carbohydrates, increasing vegetables, increasing water intake, decreasing eating out, no skipping meals, meal planning and cooking strategies, keeping healthy foods in the home, and planning for success. ? ?Marvin [name] has agreed to follow-up with our clinic in 4 weeks. He was informed of the importance of frequent follow-up visits to maximize his success with intensive lifestyle modifications for his multiple health conditions.  ? ?Objective:  ? ?Blood pressure 128/86, pulse 75, temperature 97.9 ?F (36.6 ?C), height 5\' 10"  (1.778 m), weight 234 lb (106.1 kg), SpO2 99 %. ?Body mass index is 33.58 kg/m?. ? ?General: Cooperative, alert, well developed, in no acute distress. ?HEENT: Conjunctivae and lids unremarkable. ?Cardiovascular: Regular rhythm.  ?Lungs: Normal work of breathing. ?Neurologic: No focal deficits.  ? ?Lab Results  ?Component Value Date  ? CREATININE 0.95 03/11/2021  ? BUN 12 03/11/2021  ? NA 139 03/11/2021  ? K 4.5 03/11/2021  ? CL 108 03/11/2021  ? CO2 26 03/11/2021  ? ?Lab Results  ?Component Value Date  ? ALT 14 05/13/2021  ? AST 18 05/13/2021  ? ALKPHOS 82 05/13/2021  ? BILITOT 0.5 05/13/2021  ? ?Lab Results  ?Component Value Date  ? HGBA1C 5.8 (H) 03/11/2021  ? HGBA1C 5.9 (H) 10/23/2020  ? HGBA1C 6.4 05/11/2020  ? HGBA1C 6.3 (H) 09/30/2019  ? HGBA1C 6.2 03/01/2019  ? ?Lab Results  ?Component Value Date  ? INSULIN 11.7 10/23/2020  ? INSULIN 10.6 07/04/2020  ? ?Lab Results  ?Component Value Date  ? TSH 2.170 07/04/2020  ? ?Lab Results  ?  Component Value Date  ? CHOL 182 05/13/2021  ? HDL 40.80 05/13/2021  ? LDLCALC 120 (H) 05/13/2021  ? TRIG 105.0 05/13/2021  ? CHOLHDL 4 05/13/2021  ? ?Lab Results  ?Component Value Date  ? VD25OH 42.4 10/23/2020  ? VD25OH 7.3 (L) 07/04/2020  ? ?Lab Results  ?Component Value Date  ? WBC 8.8 03/11/2021  ? HGB 15.2 03/11/2021  ? HCT 45.3 03/11/2021  ? MCV 84.0 03/11/2021  ? PLT 212 03/11/2021  ? ?No results  found for: IRON, TIBC, FERRITIN ? ?Attestation Statements:  ? ?Reviewed by clinician on day of visit: allergies, medications, problem list, medical history, surgical history, family history, social history, and previous encounter notes. ? ?I, Lizbeth Bark, RMA, am acting as transcriptionist for [employer], DO. ? ?I have reviewed the above documentation for accuracy and completeness, and I agree with the above. Jearld Lesch, DO ? ?

## 2021-05-27 ENCOUNTER — Encounter (INDEPENDENT_AMBULATORY_CARE_PROVIDER_SITE_OTHER): Payer: Self-pay | Admitting: Bariatrics

## 2021-06-17 ENCOUNTER — Other Ambulatory Visit (INDEPENDENT_AMBULATORY_CARE_PROVIDER_SITE_OTHER): Payer: Self-pay | Admitting: Bariatrics

## 2021-06-17 DIAGNOSIS — E1169 Type 2 diabetes mellitus with other specified complication: Secondary | ICD-10-CM

## 2021-06-18 ENCOUNTER — Other Ambulatory Visit (INDEPENDENT_AMBULATORY_CARE_PROVIDER_SITE_OTHER): Payer: Self-pay | Admitting: Bariatrics

## 2021-06-18 ENCOUNTER — Other Ambulatory Visit: Payer: Self-pay | Admitting: Nurse Practitioner

## 2021-06-18 DIAGNOSIS — E669 Obesity, unspecified: Secondary | ICD-10-CM

## 2021-06-18 DIAGNOSIS — J301 Allergic rhinitis due to pollen: Secondary | ICD-10-CM

## 2021-06-18 NOTE — Telephone Encounter (Signed)
Chart supports Rx 

## 2021-06-19 MED ORDER — SEMAGLUTIDE (1 MG/DOSE) 4 MG/3ML ~~LOC~~ SOPN: 1.0000 mg | PEN_INJECTOR | SUBCUTANEOUS | 0 refills | Status: DC

## 2021-06-19 NOTE — Telephone Encounter (Signed)
Dr.Brown 

## 2021-06-19 NOTE — Telephone Encounter (Signed)
LAST APPOINTMENT DATE: 05/23/21 ?NEXT APPOINTMENT DATE: 06/24/21 ? ? [id number] - [place], [place] - [phone] ?[address] ? [place] [address] ?Phone: [phone] Fax: [phone] ? ?Patient is requesting a refill of the following medications: ?Pending Prescriptions:                       Disp   Refills ?  Semaglutide, 1 MG/DOSE, 4 MG/3ML SOPN      3 mL   0       ?Sig: Inject 1 mg as directed once a week. ? ? ?Date last filled: 05/23/21 ?Previously prescribed by Dr. Manson Passey ? ?Lab Results ?     Component                Value               Date                 ?     HGBA1C                   5.8 (H)             03/11/2021           ?     HGBA1C                   5.9 (H)             10/23/2020           ?     HGBA1C                   6.4                 05/11/2020           ?Lab Results ?     Component                Value               Date                 ?     MICROALBUR               0.7                 05/11/2020           ?     LDLCALC                  120 (H)             05/13/2021           ?     CREATININE               0.95                03/11/2021           ?Lab Results ?     Component                Value               Date                 ?     VD25OH                   42.4  10/23/2020           ?     VD25OH                   7.3 (L)             07/04/2020           ? ?BP Readings from Last 3 Encounters: ?05/23/21 : 128/86 ?05/13/21 : 116/80 ?04/25/21 : 134/83 ? ? ?

## 2021-06-24 ENCOUNTER — Ambulatory Visit (INDEPENDENT_AMBULATORY_CARE_PROVIDER_SITE_OTHER): Payer: 59 | Admitting: Bariatrics

## 2021-06-24 ENCOUNTER — Encounter (INDEPENDENT_AMBULATORY_CARE_PROVIDER_SITE_OTHER): Payer: Self-pay | Admitting: Bariatrics

## 2021-06-24 VITALS — BP 135/82 | HR 67 | Temp 97.6°F | Ht 70.0 in | Wt 234.0 lb

## 2021-06-24 DIAGNOSIS — E559 Vitamin D deficiency, unspecified: Secondary | ICD-10-CM | POA: Diagnosis not present

## 2021-06-24 DIAGNOSIS — E1169 Type 2 diabetes mellitus with other specified complication: Secondary | ICD-10-CM | POA: Diagnosis not present

## 2021-06-24 DIAGNOSIS — Z7985 Long-term (current) use of injectable non-insulin antidiabetic drugs: Secondary | ICD-10-CM

## 2021-06-24 DIAGNOSIS — Z6833 Body mass index (BMI) 33.0-33.9, adult: Secondary | ICD-10-CM | POA: Diagnosis not present

## 2021-06-24 DIAGNOSIS — E669 Obesity, unspecified: Secondary | ICD-10-CM

## 2021-06-24 MED ORDER — VITAMIN D (ERGOCALCIFEROL) 1.25 MG (50000 UNIT) PO CAPS: 50000.0000 [IU] | ORAL_CAPSULE | ORAL | 0 refills | Status: DC

## 2021-06-30 NOTE — Progress Notes (Signed)
Chief Complaint:   OBESITY Marvin [name] is here to discuss his progress with his obesity treatment plan along with follow-up of his obesity related diagnoses. [name] is on the Category 2 Plan and states he is following his eating plan approximately 70% of the time. [name] states he is walking and fishing for 240 minutes 4-5 times per week.  Today's visit was #: 25 Starting weight: 242 lbs Starting date: 07/04/2020 Today's weight: 234 lbs Today's date: 06/24/2021 Total lbs lost to date: 8 lbs Total lbs lost since last in-office visit: 0  Interim History: Marvin [name] weight remains the same.   Subjective:   1. Diabetes mellitus type 2 in obese Marvin [name]) Marvin [name] is currently taking Semaglutide. He notes normal appetite.   2. Vitamin D deficiency Marvin [name] is taking Vitamin D currently.   Assessment/Plan:   1. Diabetes mellitus type 2 in obese Marvin [name]) [name] will continue taking Ozempic. Good blood sugar control is important to decrease the likelihood of diabetic complications such as nephropathy, neuropathy, limb loss, blindness, coronary artery disease, and death. Intensive lifestyle modification including diet, exercise and weight loss are the first line of treatment for diabetes.   2. Vitamin D deficiency Low Vitamin D level contributes to fatigue and are associated with obesity, breast, and colon cancer. We will refill prescription Vitamin D 50,000 IU every week for 1 month with no refills and Marvin [name] will follow-up for routine testing of Vitamin D, at least 2-3 times per year to avoid over-replacement.  - Vitamin D, Ergocalciferol, (DRISDOL) 1.25 MG (50000 UNIT) CAPS capsule; Take 1 capsule (50,000 Units total) by mouth every 7 (seven) days.  Dispense: 12 capsule; Refill: 0  3. Obesity, Current BMI 33.6 Marvin [name] is currently in the action stage of change. As such, his goal is to continue with weight loss efforts. He has agreed to the Category 2 Plan.   Marvin [name] will adhere more closely to  the plan at 80-90%. He will be mindful eating.   Exercise goals:  As is.   Behavioral modification strategies: increasing lean protein intake, decreasing simple carbohydrates, increasing vegetables, increasing water intake, decreasing eating out, no skipping meals, meal planning and cooking strategies, keeping healthy foods in the home, and planning for success.  Marvin [name] has agreed to follow-up with our clinic in 4 weeks. He was informed of the importance of frequent follow-up visits to maximize his success with intensive lifestyle modifications for his multiple health conditions.   Objective:   Blood pressure 135/82, pulse 67, temperature 97.6 F (36.4 C), height 5\' 10"  (1.778 m), weight 234 lb (106.1 kg), SpO2 99 %. Body mass index is 33.58 kg/m.  General: Cooperative, alert, well developed, in no acute distress. HEENT: Conjunctivae and lids unremarkable. Cardiovascular: Regular rhythm.  Lungs: Normal work of breathing. Neurologic: No focal deficits.   Lab Results  Component Value Date   CREATININE 0.95 03/11/2021   BUN 12 03/11/2021   NA 139 03/11/2021   K 4.5 03/11/2021   CL 108 03/11/2021   CO2 26 03/11/2021   Lab Results  Component Value Date   ALT 14 05/13/2021   AST 18 05/13/2021   ALKPHOS 82 05/13/2021   BILITOT 0.5 05/13/2021   Lab Results  Component Value Date   HGBA1C 5.8 (H) 03/11/2021   HGBA1C 5.9 (H) 10/23/2020   HGBA1C 6.4 05/11/2020   HGBA1C 6.3 (H) 09/30/2019   HGBA1C 6.2 03/01/2019   Lab Results  Component Value Date   INSULIN 11.7 10/23/2020   INSULIN 10.6 07/04/2020  Lab Results  Component Value Date   TSH 2.170 07/04/2020   Lab Results  Component Value Date   CHOL 182 05/13/2021   HDL 40.80 05/13/2021   LDLCALC 120 (H) 05/13/2021   TRIG 105.0 05/13/2021   CHOLHDL 4 05/13/2021   Lab Results  Component Value Date   VD25OH 42.4 10/23/2020   VD25OH 7.3 (L) 07/04/2020   Lab Results  Component Value Date   WBC 8.8 03/11/2021   HGB  15.2 03/11/2021   HCT 45.3 03/11/2021   MCV 84.0 03/11/2021   PLT 212 03/11/2021   No results found for: IRON, TIBC, FERRITIN  Attestation Statements:   Reviewed by clinician on day of visit: allergies, medications, problem list, medical history, surgical history, family history, social history, and previous encounter notes.  I, Marvin [name], RMA, am acting as Location manager for [employer], DO.  I have reviewed the above documentation for accuracy and completeness, and I agree with the above. Marvin Lesch, DO

## 2021-07-11 ENCOUNTER — Encounter (INDEPENDENT_AMBULATORY_CARE_PROVIDER_SITE_OTHER): Payer: Self-pay | Admitting: Bariatrics

## 2021-07-17 ENCOUNTER — Other Ambulatory Visit (INDEPENDENT_AMBULATORY_CARE_PROVIDER_SITE_OTHER): Payer: Self-pay | Admitting: Bariatrics

## 2021-07-17 DIAGNOSIS — E669 Obesity, unspecified: Secondary | ICD-10-CM

## 2021-07-21 ENCOUNTER — Other Ambulatory Visit (INDEPENDENT_AMBULATORY_CARE_PROVIDER_SITE_OTHER): Payer: Self-pay | Admitting: Bariatrics

## 2021-07-21 DIAGNOSIS — E1169 Type 2 diabetes mellitus with other specified complication: Secondary | ICD-10-CM

## 2021-07-22 ENCOUNTER — Other Ambulatory Visit (INDEPENDENT_AMBULATORY_CARE_PROVIDER_SITE_OTHER): Payer: Self-pay | Admitting: Bariatrics

## 2021-07-22 DIAGNOSIS — E1169 Type 2 diabetes mellitus with other specified complication: Secondary | ICD-10-CM

## 2021-07-22 MED ORDER — SEMAGLUTIDE (1 MG/DOSE) 4 MG/3ML ~~LOC~~ SOPN: 1.0000 mg | PEN_INJECTOR | SUBCUTANEOUS | 0 refills | Status: DC

## 2021-07-22 NOTE — Telephone Encounter (Signed)
LAST APPOINTMENT DATE: 06/24/21 NEXT APPOINTMENT DATE: 07/29/21   [facility] [id number] [place], [place] - [address] [place] [address] Phone: [phone] Fax: [phone]  Patient is requesting a refill of the following medications: Pending Prescriptions:                       Disp   Refills   Semaglutide, 1 MG/DOSE, 4 MG/3ML SOPN      3 mL   0       Sig: Inject 1 mg as directed once a week.   Date last filled: 06/19/21 Previously prescribed by Dr. Manson Passey  Lab Results      Component                Value               Date                      HGBA1C                   5.8 (H)             03/11/2021                HGBA1C                   5.9 (H)             10/23/2020                HGBA1C                   6.4                 05/11/2020           Lab Results      Component                Value               Date                      MICROALBUR               0.7                 05/11/2020                LDLCALC                  120 (H)             05/13/2021                CREATININE               0.95                03/11/2021           Lab Results      Component                Value               Date                      VD25OH                   42.4  10/23/2020                VD25OH                   7.3 (L)             07/04/2020            BP Readings from Last 3 Encounters: 06/24/21 : 135/82 05/23/21 : 128/86 05/13/21 : 116/80

## 2021-07-29 ENCOUNTER — Ambulatory Visit (INDEPENDENT_AMBULATORY_CARE_PROVIDER_SITE_OTHER): Payer: 59 | Admitting: Bariatrics

## 2021-07-29 ENCOUNTER — Encounter (INDEPENDENT_AMBULATORY_CARE_PROVIDER_SITE_OTHER): Payer: Self-pay | Admitting: Bariatrics

## 2021-07-29 VITALS — BP 136/83 | HR 61 | Temp 97.8°F | Ht 70.0 in | Wt 233.0 lb

## 2021-07-29 DIAGNOSIS — E669 Obesity, unspecified: Secondary | ICD-10-CM | POA: Diagnosis not present

## 2021-07-29 DIAGNOSIS — E66811 Obesity, class 1: Secondary | ICD-10-CM

## 2021-07-29 DIAGNOSIS — I10 Essential (primary) hypertension: Secondary | ICD-10-CM

## 2021-07-29 DIAGNOSIS — Z6833 Body mass index (BMI) 33.0-33.9, adult: Secondary | ICD-10-CM

## 2021-07-29 DIAGNOSIS — E1169 Type 2 diabetes mellitus with other specified complication: Secondary | ICD-10-CM | POA: Diagnosis not present

## 2021-07-29 DIAGNOSIS — E559 Vitamin D deficiency, unspecified: Secondary | ICD-10-CM | POA: Diagnosis not present

## 2021-07-30 NOTE — Progress Notes (Unsigned)
Chief Complaint:   OBESITY Cephas is here to discuss his progress with his obesity treatment plan along with follow-up of his obesity related diagnoses. Mussie is on the Category 2 Plan and states he is following his eating plan approximately 70% of the time. Erick states he is walking for 4 hours 4 times per week, fishing for 5 hours 2 times per week and working for 4 hours  4 times per week.  Today's visit was #: 5 Starting weight: 242 lbs Starting date: 07/04/2020 Today's weight: 233 lbs Today's date: 07/29/2021 Total lbs lost to date: 9 lbs Total lbs lost since last in-office visit: 1 lb  Interim History: Damauri is down 1 lb since his last visit. He is skipping meal and staying well hydrated.   Subjective:   1. Diabetes mellitus type 2 in obese [facility]) Rayven is taking Ozempic currently.   2. Essential hypertension Davon blood pressure is controlled. His blood pressure today was 136/83.  3. Vitamin D deficiency Ragnar is taking prescription Vitamin D currently.   Assessment/Plan:   1. Diabetes mellitus type 2 in obese [facility]) Yorick will continue taking Ozempic. Good blood sugar control is important to decrease the likelihood of diabetic complications such as nephropathy, neuropathy, limb loss, blindness, coronary artery disease, and death. Intensive lifestyle modification including diet, exercise and weight loss are the first line of treatment for diabetes.   2. Essential hypertension Kyros will continue his medications. He is working on healthy weight loss and exercise to improve blood pressure control. We will watch for signs of hypotension as he continues his lifestyle modifications.  3. Vitamin D deficiency Low Vitamin D level contributes to fatigue and are associated with obesity, breast, and colon cancer. Jameis agrees to continue to take prescription Vitamin D and he will follow-up for routine testing of Vitamin D, at least 2-3 times per year to avoid  over-replacement.  4. Obesity, Current BMI 33.5 Kayron is currently in the action stage of change. As such, his goal is to continue with weight loss efforts. He has agreed to the Category 2 Plan.   Alver will continue meal planning and he will continue intentional eating. He will follow his plan close.   Exercise goals:  As is. Chantha is going to the [employer] for [employer] and swimming. He is fishing and his activities is good.   Behavioral modification strategies: increasing lean protein intake, decreasing simple carbohydrates, increasing vegetables, increasing water intake, decreasing eating out, no skipping meals, meal planning and cooking strategies, keeping healthy foods in the home, and planning for success.  Dakoata has agreed to follow-up with our clinic in 4 weeks. He was informed of the importance of frequent follow-up visits to maximize his success with intensive lifestyle modifications for his multiple health conditions.   Objective:   Blood pressure 136/83, pulse 61, temperature 97.8 F (36.6 C), height 5\' 10"  (1.778 m), weight 233 lb (105.7 kg), SpO2 97 %. Body mass index is 33.43 kg/m.  General: Cooperative, alert, well developed, in no acute distress. HEENT: Conjunctivae and lids unremarkable. Cardiovascular: Regular rhythm.  Lungs: Normal work of breathing. Neurologic: No focal deficits.   Lab Results  Component Value Date   CREATININE 0.95 03/11/2021   BUN 12 03/11/2021   NA 139 03/11/2021   K 4.5 03/11/2021   CL 108 03/11/2021   CO2 26 03/11/2021   Lab Results  Component Value Date   ALT 14 05/13/2021   AST 18 05/13/2021   ALKPHOS  82 05/13/2021   BILITOT 0.5 05/13/2021   Lab Results  Component Value Date   HGBA1C 5.8 (H) 03/11/2021   HGBA1C 5.9 (H) 10/23/2020   HGBA1C 6.4 05/11/2020   HGBA1C 6.3 (H) 09/30/2019   HGBA1C 6.2 03/01/2019   Lab Results  Component Value Date   INSULIN 11.7 10/23/2020   INSULIN 10.6 07/04/2020   Lab Results   Component Value Date   TSH 2.170 07/04/2020   Lab Results  Component Value Date   CHOL 182 05/13/2021   HDL 40.80 05/13/2021   LDLCALC 120 (H) 05/13/2021   TRIG 105.0 05/13/2021   CHOLHDL 4 05/13/2021   Lab Results  Component Value Date   VD25OH 42.4 10/23/2020   VD25OH 7.3 (L) 07/04/2020   Lab Results  Component Value Date   WBC 8.8 03/11/2021   HGB 15.2 03/11/2021   HCT 45.3 03/11/2021   MCV 84.0 03/11/2021   PLT 212 03/11/2021   No results found for: "IRON", "TIBC", "FERRITIN"  Attestation Statements:   Reviewed by clinician on day of visit: allergies, medications, problem list, medical history, surgical history, family history, social history, and previous encounter notes.  I, Lizbeth Bark, RMA, am acting as Location manager for [employer], DO.  I have reviewed the above documentation for accuracy and completeness, and I agree with the above. Jearld Lesch, DO

## 2021-07-31 ENCOUNTER — Encounter (INDEPENDENT_AMBULATORY_CARE_PROVIDER_SITE_OTHER): Payer: Self-pay | Admitting: Bariatrics

## 2021-08-03 IMAGING — RF DG C-ARM 1-60 MIN-NO REPORT
1 series · 2 of 2 positions shown · non-contrast
Comparison: 08/30/2019

CLINICAL DATA: Right hip replacement

EXAM:
OPERATIVE right HIP (WITH PELVIS IF PERFORMED) 2 VIEWS
TECHNIQUE: Fluoroscopic spot image(s) were submitted for interpretation
post-operatively.

[Series 1: unknown protocol · 0.20mm/px · 2 of 2 slices shown]
[im 1/2]
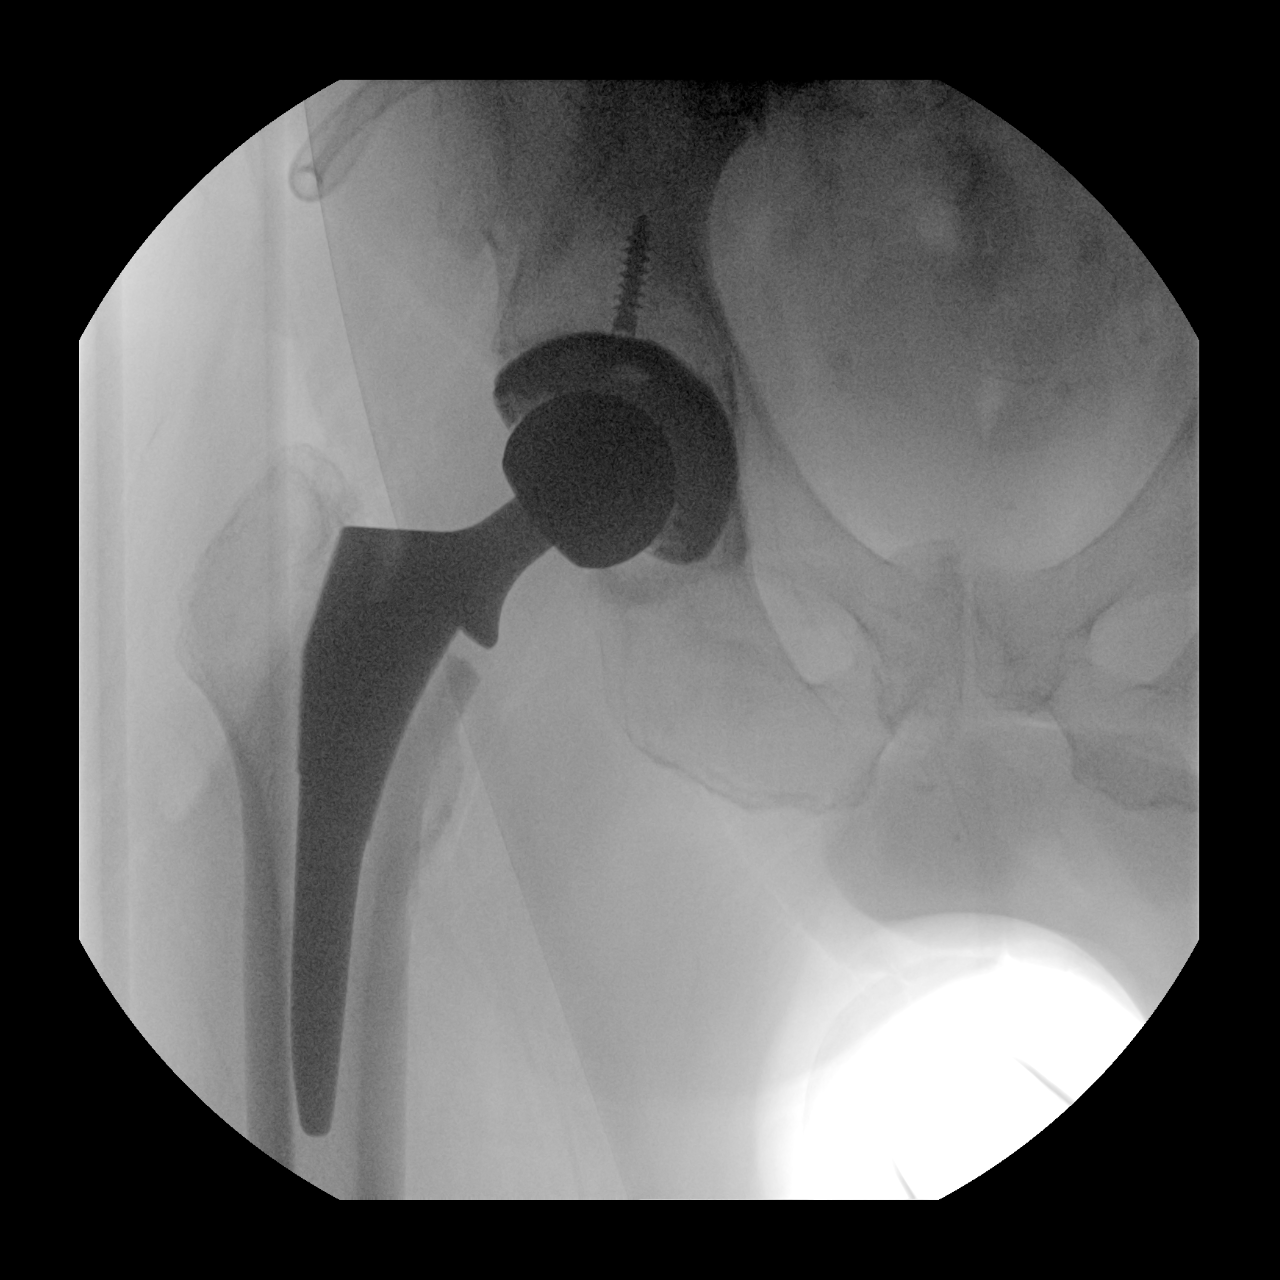
[im 2/2]
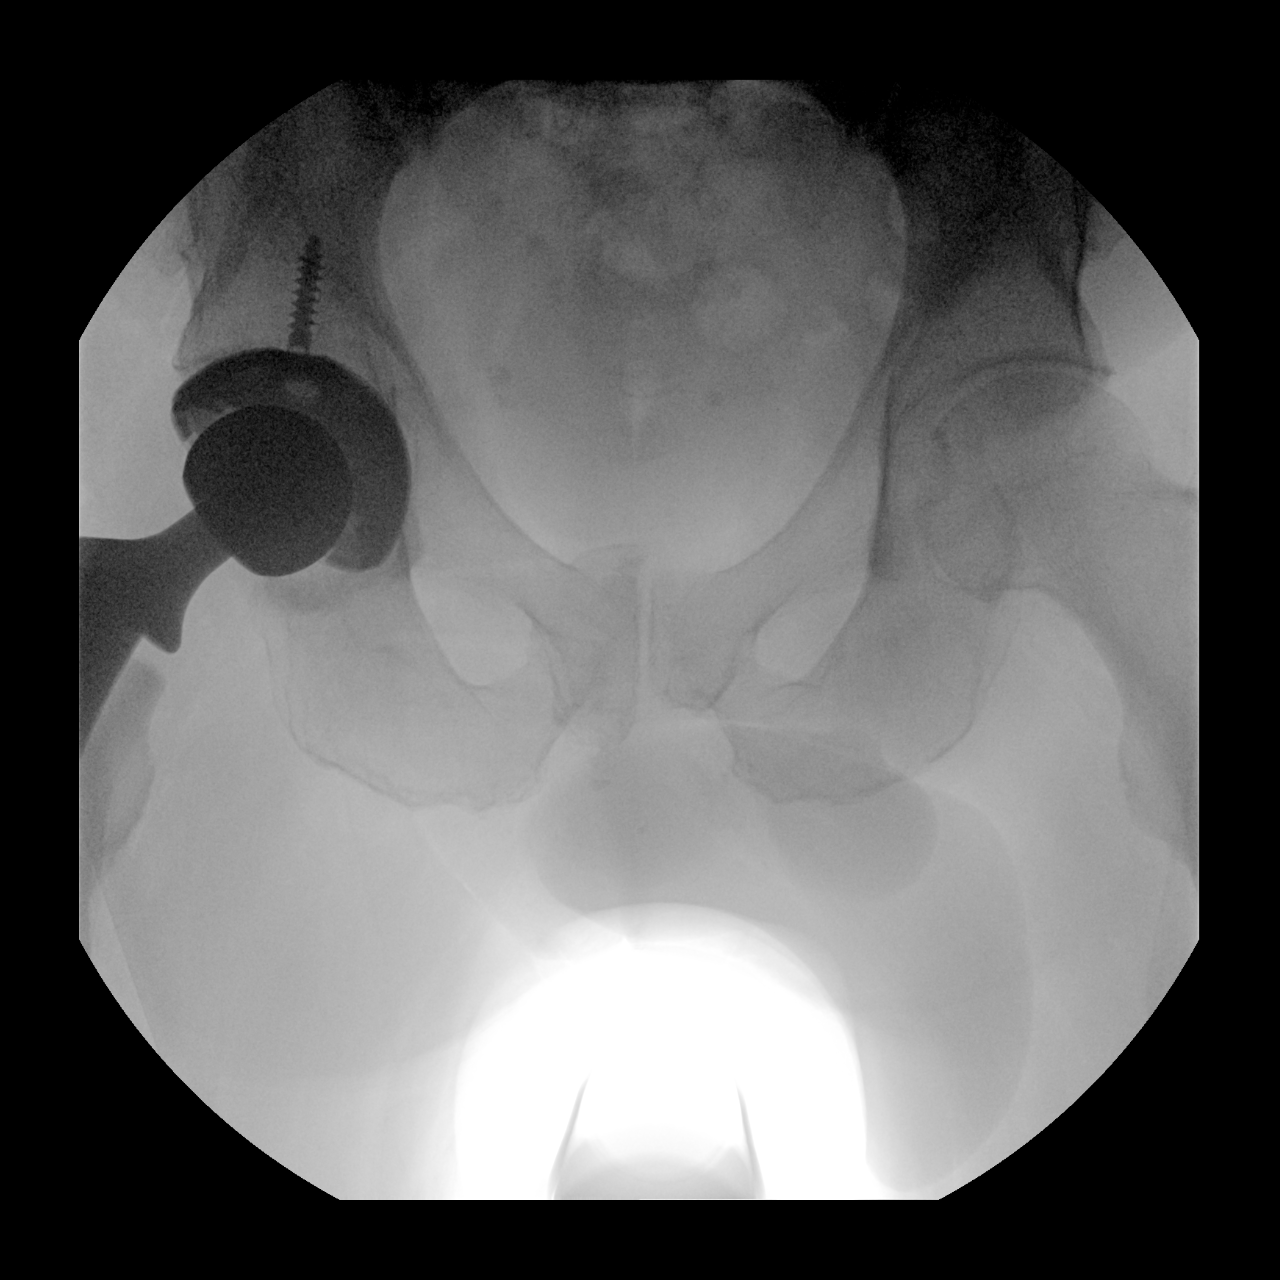

[2 of 2 positions shown; findings below may reference images not displayed]

FINDINGS: Right hip replacement in satisfactory position and alignment. No
fracture or complication.
IMPRESSION: Satisfactory right hip replacement

## 2021-08-03 IMAGING — RF DG HIP (WITH PELVIS) OPERATIVE*R*
1 series · 2 of 2 positions shown · non-contrast
Comparison: 08/30/2019

CLINICAL DATA: Right hip replacement

EXAM:
OPERATIVE right HIP (WITH PELVIS IF PERFORMED) 2 VIEWS
TECHNIQUE: Fluoroscopic spot image(s) were submitted for interpretation
post-operatively.

[Series 1: unknown protocol · 0.20mm/px · 2 of 2 slices shown]
[im 1/2]
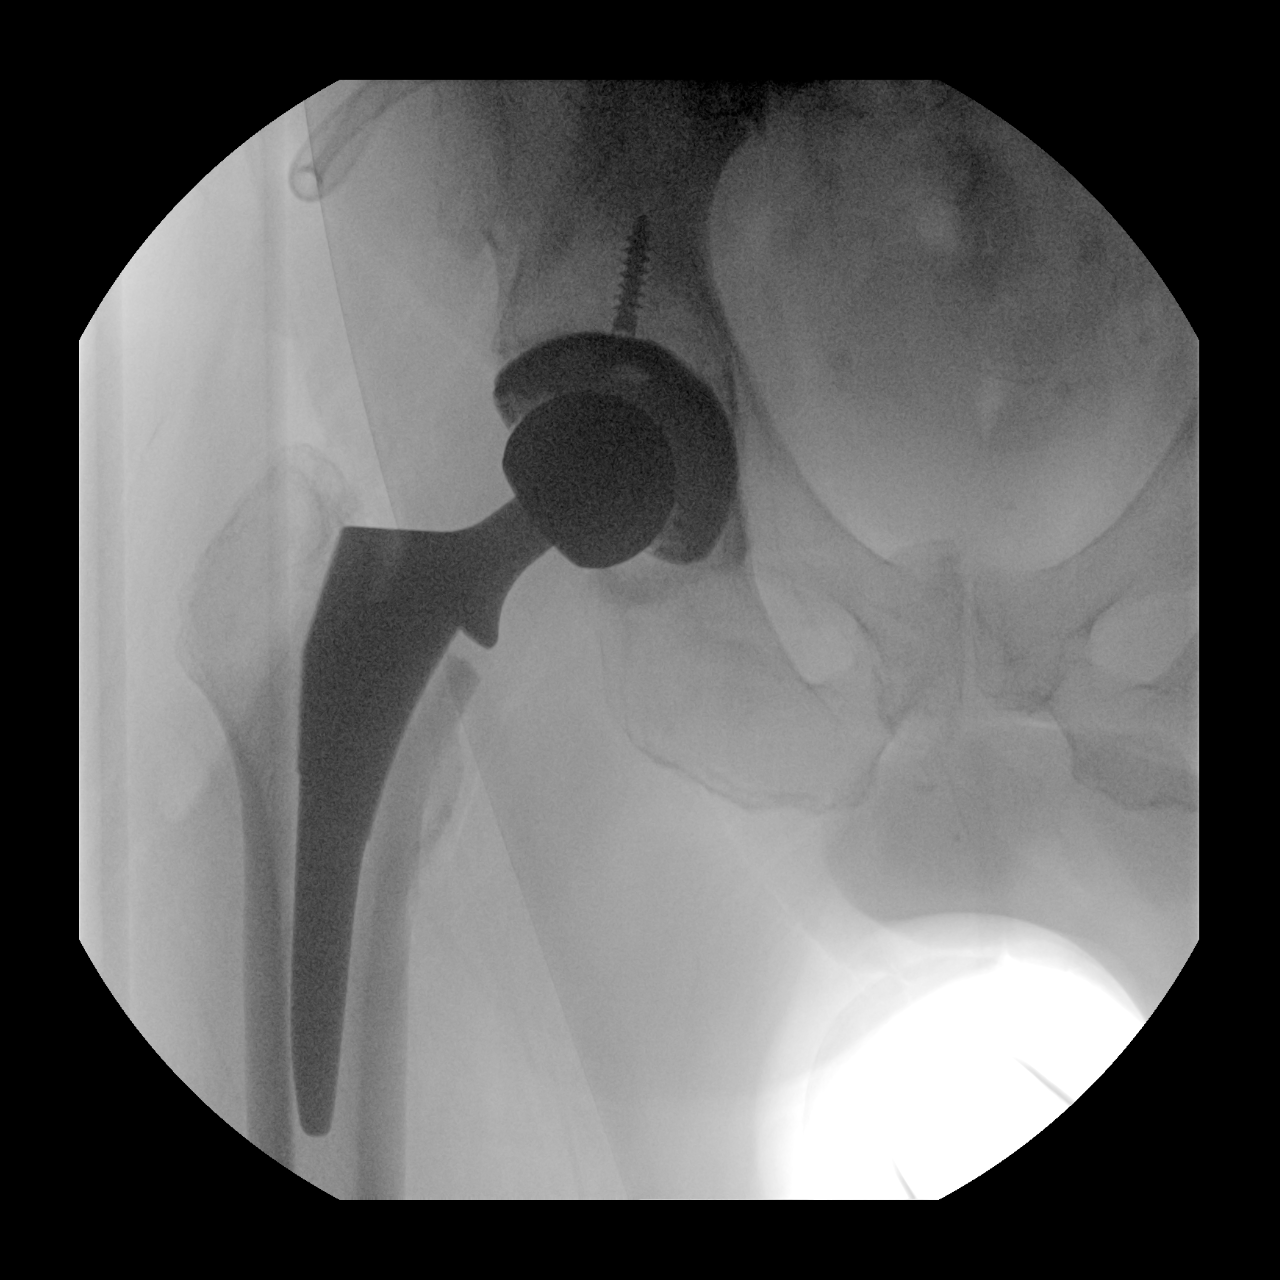
[im 2/2]
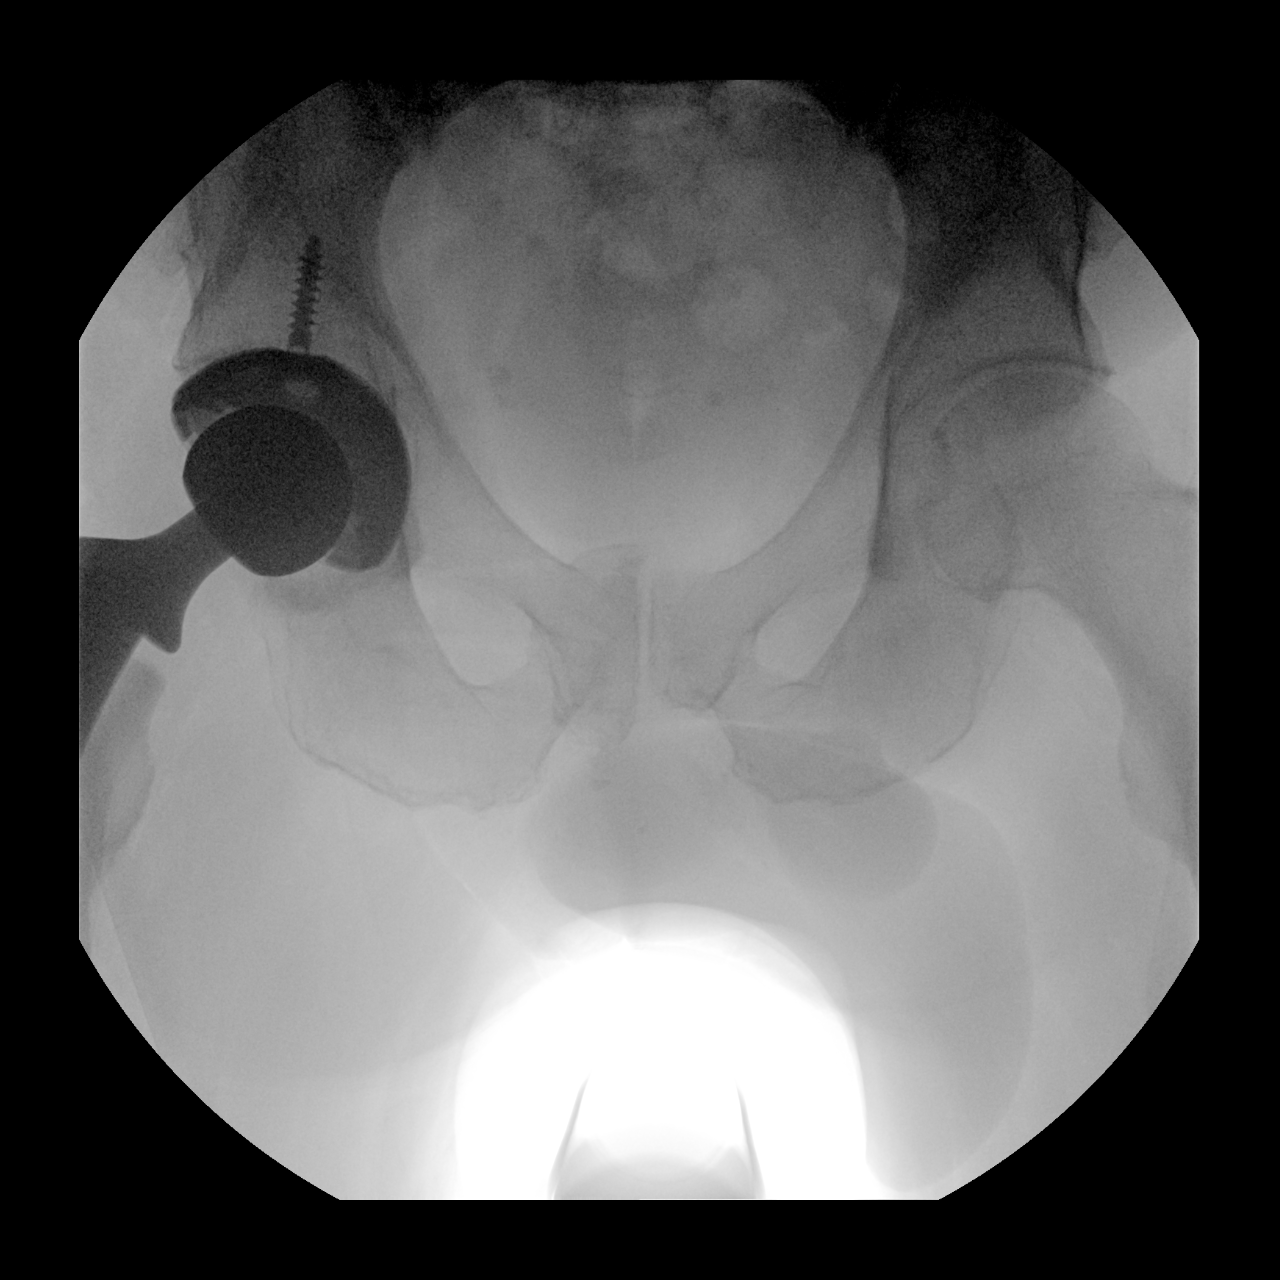

[2 of 2 positions shown; findings below may reference images not displayed]

FINDINGS: Right hip replacement in satisfactory position and alignment. No
fracture or complication.
IMPRESSION: Satisfactory right hip replacement

## 2021-08-17 ENCOUNTER — Other Ambulatory Visit (INDEPENDENT_AMBULATORY_CARE_PROVIDER_SITE_OTHER): Payer: Self-pay | Admitting: Bariatrics

## 2021-08-17 DIAGNOSIS — E1169 Type 2 diabetes mellitus with other specified complication: Secondary | ICD-10-CM

## 2021-08-21 ENCOUNTER — Other Ambulatory Visit (INDEPENDENT_AMBULATORY_CARE_PROVIDER_SITE_OTHER): Payer: Self-pay | Admitting: Bariatrics

## 2021-08-21 DIAGNOSIS — E669 Obesity, unspecified: Secondary | ICD-10-CM

## 2021-08-26 ENCOUNTER — Encounter (INDEPENDENT_AMBULATORY_CARE_PROVIDER_SITE_OTHER): Payer: Self-pay | Admitting: Bariatrics

## 2021-08-26 ENCOUNTER — Ambulatory Visit (INDEPENDENT_AMBULATORY_CARE_PROVIDER_SITE_OTHER): Payer: 59 | Admitting: Bariatrics

## 2021-08-26 VITALS — BP 117/72 | HR 73 | Temp 97.8°F | Ht 70.0 in | Wt 232.0 lb

## 2021-08-26 DIAGNOSIS — Z6833 Body mass index (BMI) 33.0-33.9, adult: Secondary | ICD-10-CM | POA: Diagnosis not present

## 2021-08-26 DIAGNOSIS — E1169 Type 2 diabetes mellitus with other specified complication: Secondary | ICD-10-CM | POA: Diagnosis not present

## 2021-08-26 DIAGNOSIS — Z6834 Body mass index (BMI) 34.0-34.9, adult: Secondary | ICD-10-CM

## 2021-08-26 DIAGNOSIS — E669 Obesity, unspecified: Secondary | ICD-10-CM

## 2021-08-26 DIAGNOSIS — E559 Vitamin D deficiency, unspecified: Secondary | ICD-10-CM | POA: Diagnosis not present

## 2021-08-26 DIAGNOSIS — Z7985 Long-term (current) use of injectable non-insulin antidiabetic drugs: Secondary | ICD-10-CM

## 2021-08-26 DIAGNOSIS — E119 Type 2 diabetes mellitus without complications: Secondary | ICD-10-CM

## 2021-08-26 MED ORDER — VITAMIN D (ERGOCALCIFEROL) 1.25 MG (50000 UNIT) PO CAPS: 50000.0000 [IU] | ORAL_CAPSULE | ORAL | 0 refills | Status: DC

## 2021-08-26 MED ORDER — SEMAGLUTIDE (1 MG/DOSE) 4 MG/3ML ~~LOC~~ SOPN: 1.0000 mg | PEN_INJECTOR | SUBCUTANEOUS | 0 refills | Status: DC

## 2021-08-27 NOTE — Progress Notes (Unsigned)
Chief Complaint:   OBESITY Marvin [name] is here to discuss his progress with his obesity treatment plan along with follow-up of his obesity related diagnoses. Marvin [name] is on the Category 2 Plan and states he is following his eating plan approximately 60% of the time. Marvin [name] states he is swimming/walking 10,000 steps 4 times per week.  Today's visit was #: 18 Starting weight: 242 lbs Starting date: 07/04/2020 Today's weight: 232 lbs Today's date: 08/26/2021 Total lbs lost to date: 10 Total lbs lost since last in-office visit: 1  Interim History: Marvin [name] is down 1 additional pound since his last visit.  Subjective:   1. Diabetes mellitus type 2 in obese Marvin [name]) Marvin [name] is taking his medications as directed.  2. Vitamin D deficiency Marvin [name] is taking prescription vitamin D as directed.  Assessment/Plan:   1. Diabetes mellitus type 2 in obese Marvin [name]) Marvin [name] will continue semaglutide 1 mg once weekly, and we will refill for 1 month.  - Semaglutide, 1 MG/DOSE, 4 MG/3ML SOPN; Inject 1 mg as directed once a week.  Dispense: 3 mL; Refill: 0  2. Vitamin D deficiency Marvin [name] will continue prescription vitamin D 50,000 units once weekly, and we will refill for 90 days.  - Vitamin D, Ergocalciferol, (DRISDOL) 1.25 MG (50000 UNIT) CAPS capsule; Take 1 capsule (50,000 Units total) by mouth every 7 (seven) days.  Dispense: 12 capsule; Refill: 0  3. Obesity, Current BMI 33.3 Marvin [name] is currently in the action stage of change. As such, his goal is to continue with weight loss efforts. He has agreed to the Category 2 Plan.   Meal planning and intentional eating were discussed.  Exercise goals: As is.  Behavioral modification strategies: increasing lean protein intake, decreasing simple carbohydrates, increasing vegetables, increasing water intake, decreasing eating out, no skipping meals, meal planning and cooking strategies, keeping healthy foods in the home, and planning for  success.  Marvin [name] has agreed to follow-up with our clinic in 4 weeks. He was informed of the importance of frequent follow-up visits to maximize his success with intensive lifestyle modifications for his multiple health conditions.   Objective:   Blood pressure 117/72, pulse 73, temperature 97.8 F (36.6 C), height 5\' 10"  (1.778 m), weight 232 lb (105.2 kg), SpO2 97 %. Body mass index is 33.29 kg/m.  General: Cooperative, alert, well developed, in no acute distress. HEENT: Conjunctivae and lids unremarkable. Cardiovascular: Regular rhythm.  Lungs: Normal work of breathing. Neurologic: No focal deficits.   Lab Results  Component Value Date   CREATININE 0.95 03/11/2021   BUN 12 03/11/2021   NA 139 03/11/2021   K 4.5 03/11/2021   CL 108 03/11/2021   CO2 26 03/11/2021   Lab Results  Component Value Date   ALT 14 05/13/2021   AST 18 05/13/2021   ALKPHOS 82 05/13/2021   BILITOT 0.5 05/13/2021   Lab Results  Component Value Date   HGBA1C 5.8 (H) 03/11/2021   HGBA1C 5.9 (H) 10/23/2020   HGBA1C 6.4 05/11/2020   HGBA1C 6.3 (H) 09/30/2019   HGBA1C 6.2 03/01/2019   Lab Results  Component Value Date   INSULIN 11.7 10/23/2020   INSULIN 10.6 07/04/2020   Lab Results  Component Value Date   TSH 2.170 07/04/2020   Lab Results  Component Value Date   CHOL 182 05/13/2021   HDL 40.80 05/13/2021   LDLCALC 120 (H) 05/13/2021   TRIG 105.0 05/13/2021   CHOLHDL 4 05/13/2021   Lab Results  Component Value Date   VD25OH  42.4 10/23/2020   VD25OH 7.3 (L) 07/04/2020   Lab Results  Component Value Date   WBC 8.8 03/11/2021   HGB 15.2 03/11/2021   HCT 45.3 03/11/2021   MCV 84.0 03/11/2021   PLT 212 03/11/2021   No results found for: "IRON", "TIBC", "FERRITIN"  Attestation Statements:   Reviewed by clinician on day of visit: allergies, medications, problem list, medical history, surgical history, family history, social history, and previous encounter notes.   Trude Mcburney, am acting as Energy manager for [employer], DO.  I have reviewed the above documentation for accuracy and completeness, and I agree with the above. Corinna Capra, DO

## 2021-08-28 ENCOUNTER — Encounter (INDEPENDENT_AMBULATORY_CARE_PROVIDER_SITE_OTHER): Payer: Self-pay | Admitting: Bariatrics

## 2021-09-03 ENCOUNTER — Ambulatory Visit (INDEPENDENT_AMBULATORY_CARE_PROVIDER_SITE_OTHER): Payer: 59 | Admitting: Orthopaedic Surgery

## 2021-09-03 ENCOUNTER — Ambulatory Visit (INDEPENDENT_AMBULATORY_CARE_PROVIDER_SITE_OTHER): Payer: 59

## 2021-09-03 DIAGNOSIS — Z96641 Presence of right artificial hip joint: Secondary | ICD-10-CM

## 2021-09-03 NOTE — Progress Notes (Signed)
Post-Op Visit Note   Patient: Marvin [name]           Date of Birth: 18-Aug-1950           MRN: [id number] Visit Date: 09/03/2021 PCP: Anne Ng, NP   Assessment & Plan:  Chief Complaint:  Chief Complaint  Patient presents with   Right Hip - Pain   Visit Diagnoses:  1. Status post total hip replacement, right     Plan: [name] is approximately 2 years status post right total hip in August 2021.  He is doing well.  Has no complaints.  The hip is functioning extremely well for him.  Right hip shows a fully healed surgical scar.  X-rays demonstrate stable implant without any complications.  At this point we can discontinue dental prophylaxis.  X-rays look good today.  We can see him back as needed.  Activity as tolerated.  Follow-Up Instructions: No follow-ups on file.   Orders:  Orders Placed This Encounter  Procedures   XR Pelvis 1-2 Views   No orders of the defined types were placed in this encounter.   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   S/P hernia repair 03/15/2020   Primary osteoarthritis of right hip 10/03/2019   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   Chronic bilateral low back pain without sciatica 02/21/2019   Type 2 diabetes mellitus without complication, without long-term current use of insulin (HCC) 08/25/2017   Hyperuricemia 08/25/2017   Acid reflux 02/23/2017   Gout 05/20/2016   HTN (hypertension) 09/11/2015   Hyperlipidemia 09/11/2015   Past Medical History:  Diagnosis Date   Alcohol abuse    Allergy    Arthritis    Back pain    Constipation 03/15/2020   Drug abuse (HCC)    GERD (gastroesophageal reflux disease)    Gout    Hernia     Hypercholesteremia    Hypertension    Joint pain    Obesity    Osteoarthritis    Pre-diabetes    Prediabetes    Stomach ulcer    Swallowing difficulty    Wears dentures    bottom front    Family History  Problem Relation Age of Onset   Healthy Mother    Healthy Father    Cancer Brother        bone cancer   Cancer Maternal Aunt        breast cancer   Cancer Maternal Uncle        lukemia   Cancer Cousin        unknown   Cancer Maternal Uncle        lukemia   Cancer Paternal Uncle        prostate   Hearing loss Maternal Grandfather 81    Past Surgical History:  Procedure Laterality Date   HERNIA REPAIR     twice   left shoulder rotator cuff repair      REPLACEMENT TOTAL KNEE     partial   SHOULDER SURGERY     right   TOTAL HIP ARTHROPLASTY Right 10/03/2019   Procedure: RIGHT TOTAL HIP ARTHROPLASTY ANTERIOR APPROACH;  Surgeon: Tarry Kos, MD;  Location: [facility];  Service:  Orthopedics;  Laterality: Right;   Social History   Occupational History   Not on file  Tobacco Use   Smoking status: Former    Types: Cigarettes    Quit date: 03/20/1998    Years since quitting: 23.4   Smokeless tobacco: Former    Types: Associate Professor Use: Never used  Substance and Sexual Activity   Alcohol use: Not Currently    Comment: hx of 23 years ago   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

## 2021-09-18 ENCOUNTER — Emergency Department (HOSPITAL_COMMUNITY): Payer: 59

## 2021-09-18 ENCOUNTER — Emergency Department (HOSPITAL_COMMUNITY)
Admission: EM | Admit: 2021-09-18 | Discharge: 2021-09-18 | Disposition: A | Discharge status: Home / Self Care | Payer: 59 | Attending: Emergency Medicine | Admitting: Emergency Medicine

## 2021-09-18 ENCOUNTER — Other Ambulatory Visit: Payer: Self-pay

## 2021-09-18 ENCOUNTER — Encounter (HOSPITAL_COMMUNITY): Payer: Self-pay | Admitting: Emergency Medicine

## 2021-09-18 DIAGNOSIS — I951 Orthostatic hypotension: Secondary | ICD-10-CM | POA: Diagnosis not present

## 2021-09-18 DIAGNOSIS — Z79899 Other long term (current) drug therapy: Secondary | ICD-10-CM | POA: Diagnosis not present

## 2021-09-18 DIAGNOSIS — Z7952 Long term (current) use of systemic steroids: Secondary | ICD-10-CM | POA: Diagnosis not present

## 2021-09-18 DIAGNOSIS — I10 Essential (primary) hypertension: Secondary | ICD-10-CM | POA: Diagnosis not present

## 2021-09-18 DIAGNOSIS — Z743 Need for continuous supervision: Secondary | ICD-10-CM | POA: Diagnosis not present

## 2021-09-18 DIAGNOSIS — R404 Transient alteration of awareness: Secondary | ICD-10-CM | POA: Diagnosis not present

## 2021-09-18 DIAGNOSIS — E860 Dehydration: Secondary | ICD-10-CM | POA: Diagnosis not present

## 2021-09-18 DIAGNOSIS — R42 Dizziness and giddiness: Secondary | ICD-10-CM | POA: Diagnosis present

## 2021-09-18 DIAGNOSIS — I499 Cardiac arrhythmia, unspecified: Secondary | ICD-10-CM | POA: Diagnosis not present

## 2021-09-18 LAB — CBC WITH DIFFERENTIAL/PLATELET
Abs Immature Granulocytes: 0.02 10*3/uL (ref 0.00–0.07)
Basophils Absolute: 0 10*3/uL (ref 0.0–0.1)
Basophils Relative: 0 %
Eosinophils Absolute: 0.1 10*3/uL (ref 0.0–0.5)
Eosinophils Relative: 1 %
HCT: 41.1 % (ref 39.0–52.0)
Hemoglobin: 13.8 g/dL (ref 13.0–17.0)
Immature Granulocytes: 0 %
Lymphocytes Relative: 13 %
Lymphs Abs: 1.2 10*3/uL (ref 0.7–4.0)
MCH: 27.8 pg (ref 26.0–34.0)
MCHC: 33.6 g/dL (ref 30.0–36.0)
MCV: 82.7 fL (ref 80.0–100.0)
Monocytes Absolute: 0.6 10*3/uL (ref 0.1–1.0)
Monocytes Relative: 7 %
Neutro Abs: 7.1 10*3/uL (ref 1.7–7.7)
Neutrophils Relative %: 79 %
Platelets: 190 10*3/uL (ref 150–400)
RBC: 4.97 MIL/uL (ref 4.22–5.81)
RDW: 14.4 % (ref 11.5–15.5)
WBC: 9 10*3/uL (ref 4.0–10.5)
nRBC: 0 % (ref 0.0–0.2)

## 2021-09-18 LAB — COMPREHENSIVE METABOLIC PANEL
ALT: 15 U/L (ref 0–44)
AST: 33 U/L (ref 15–41)
Albumin: 3.9 g/dL (ref 3.5–5.0)
Alkaline Phosphatase: 63 U/L (ref 38–126)
Anion gap: 6 (ref 5–15)
BUN: 13 mg/dL (ref 8–23)
CO2: 20 mmol/L — ABNORMAL LOW (ref 22–32)
Calcium: 8.5 mg/dL — ABNORMAL LOW (ref 8.9–10.3)
Chloride: 113 mmol/L — ABNORMAL HIGH (ref 98–111)
Creatinine, Ser: 0.85 mg/dL (ref 0.61–1.24)
GFR, Estimated: >60 mL/min (ref >60)
Glucose, Bld: 105 mg/dL — ABNORMAL HIGH (ref 70–99)
Potassium: 5.2 mmol/L — ABNORMAL HIGH (ref 3.5–5.1)
Sodium: 139 mmol/L (ref 135–145)
Total Bilirubin: 0.8 mg/dL (ref 0.3–1.2)
Total Protein: 6.8 g/dL (ref 6.5–8.1)

## 2021-09-18 LAB — TROPONIN I (HIGH SENSITIVITY): Troponin I (High Sensitivity): 2 ng/L (ref <18)

## 2021-09-18 NOTE — Discharge Instructions (Addendum)
All your labs, EKG and chest x-ray look normal today.  Most likely you are dehydrated from working and not getting enough fluids.  Make sure you are drinking at least 6-8 bottles of water per day.

## 2021-09-18 NOTE — ED Provider Notes (Signed)
[facility]ENCY [facility]PT Provider Note   CSN: [id number] Arrival date & time: 09/18/21  1324     History  Chief Complaint  Patient presents with   Dizziness   Weakness    KIEREN ADKISON is a 71 y.o. male.  Patient is a 71 year old male with a history of hypertension, hypercholesterolemia, prediabetes who is presenting today with complaints of being lightheaded and dizzy.  Patient reports that he works third shift he came home from work and felt fine and went to bed.  Woke up around 9 AM this morning and ate some breakfast and had a drink of water and then went back to bed but when he woke up an hour later he was feeling lightheaded.  It made him sweaty and feel unwell.  He reports anytime that he would sit up or try to stand the lightheadedness would feel worse and he had to lay down flat.  Because he was not feeling better he called 911.  He denied any nausea, vomiting, chest pain, palpitations or shortness of breath during this event.  He did not lose consciousness or fall.  He reported once EMS got there they gave him fluids and he started feeling much better.  Initially he reported orthostasis with the EMS that improved after fluids.  Currently he has no complaints.  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  He denies any recent illness including fever, cough, congestion or shortness of breath.  No medication changes.  He takes amlodipine daily and took it after he woke up and started feeling bad.  The history is provided by the patient.  Dizziness Quality:  Lightheadedness Associated symptoms: weakness   Weakness Associated symptoms: dizziness        Home Medications Prior to Admission medications   Medication Sig Start Date End Date Taking? Authorizing Provider  allopurinol (ZYLOPRIM) 100 MG tablet TAKE ONE TABLET BY MOUTH TWICE A DAY. 05/13/21   Nche,  Bonna Gains, NP  amLODipine (NORVASC) 10 MG tablet Take 1 tablet (10 mg total) by mouth daily. 05/13/21   Nche, Bonna Gains, NP  amoxicillin (AMOXIL) 500 MG capsule Take 2,000 mg by mouth See admin instructions. 1 hr prior before dental procedure 05/07/20   [provider]  aspirin EC 81 MG tablet Take 1 tablet (81 mg total) by mouth 2 (two) times daily. Patient taking differently: Take 81 mg by mouth daily. 10/03/19   Cristie Hem, PA-C  colchicine 0.6 MG tablet Take 1.2 mg by mouth daily as needed (gout).    [provider]  cyclobenzaprine (FLEXERIL) 10 MG tablet TAKE ONE-HALF TABLET BY MOUTH EVERY NIGHT AT BEDTIME AS NEEDED FOR MUSCLE SPASMS 09/03/20   Worthy Rancher B, FNP  fluticasone (FLONASE) 50 MCG/ACT nasal spray SPRAY TWO SPRAYS IN EACH NOSTRIL ONCE DAILY 06/18/21   Nche, Bonna Gains, NP  Semaglutide, 1 MG/DOSE, 4 MG/3ML SOPN Inject 1 mg as directed once a week. 08/26/21   Corinna Capra A, DO  simvastatin (ZOCOR) 20 MG tablet Take 1 tablet (20 mg total) by mouth 2 (two) times a week. 05/13/21   Nche, Bonna Gains, NP  Vitamin D, Ergocalciferol, (DRISDOL) 1.25 MG (50000 UNIT) CAPS capsule Take 1 capsule (50,000 Units total) by mouth every 7 (seven) days. 08/26/21   Corinna Capra A, DO      Allergies    Buprenorphine hcl and Morphine  and related    Review of Systems   Review of Systems  Neurological:  Positive for dizziness and weakness.    Physical Exam Updated Vital Signs BP 125/86   Pulse 61   Temp 97.9 F (36.6 C) (Oral)   Resp 14   Ht 5\' 10"  (1.778 m)   Wt 106.1 kg   SpO2 98%   BMI 33.58 kg/m  Physical Exam Vitals and nursing note reviewed.  Constitutional:      General: He is not in acute distress.    Appearance: He is well-developed.  HENT:     Head: Normocephalic and atraumatic.  Eyes:     Conjunctiva/sclera: Conjunctivae normal.     Pupils: Pupils are equal, round, and reactive to light.  Cardiovascular:     Rate and Rhythm: Normal rate  and regular rhythm.     Pulses: Normal pulses.     Heart sounds: No murmur heard. Pulmonary:     Effort: Pulmonary effort is normal. No respiratory distress.     Breath sounds: Normal breath sounds. No wheezing or rales.  Abdominal:     General: There is no distension.     Palpations: Abdomen is soft.     Tenderness: There is no abdominal tenderness. There is no guarding or rebound.  Musculoskeletal:        General: No tenderness. Normal range of motion.     Cervical back: Normal range of motion and neck supple.  Skin:    General: Skin is warm and dry.     Findings: No erythema or rash.  Neurological:     Mental Status: He is alert and oriented to person, place, and time. Mental status is at baseline.  Psychiatric:        Mood and Affect: Mood normal.        Behavior: Behavior normal.     ED Results / Procedures / Treatments   Labs (all labs ordered are listed, but only abnormal results are displayed) Labs Reviewed  COMPREHENSIVE METABOLIC PANEL - Abnormal; Notable for the following components:      Result Value   Potassium 5.2 (*)    Chloride 113 (*)    CO2 20 (*)    Glucose, Bld 105 (*)    Calcium 8.5 (*)    All other components within normal limits  CBC WITH DIFFERENTIAL/PLATELET  TROPONIN I (HIGH SENSITIVITY)    EKG EKG Interpretation  Date/Time:  Wednesday September 18 2021 14:06:24 EDT Ventricular Rate:  60 PR Interval:  237 QRS Duration: 96 QT Interval:  442 QTC Calculation: 442 R Axis:   21 Text Interpretation: Sinus rhythm Prolonged PR interval Abnormal R-wave progression, early transition No significant change since last tracing Confirmed by [id number] (Gwyneth Sprout) on 09/18/2021 2:26:31 PM  Radiology DG Chest Port 1 View  Result Date: 09/18/2021 CLINICAL DATA:  Near syncope.  Dizziness and weakness. EXAM: PORTABLE CHEST 1 VIEW COMPARISON:  09/30/2019 FINDINGS: Stable asymmetric elevation left hemidiaphragm. The lungs are clear without focal pneumonia,  edema, pneumothorax or pleural effusion. The cardio pericardial silhouette is enlarged. Telemetry leads overlie the chest. IMPRESSION: No active disease. Electronically Signed   By: 10/02/2019 M.D.   On: 09/18/2021 14:24    Procedures Procedures    Medications Ordered in ED Medications - No data to display  ED Course/ Medical Decision Making/ A&P  Medical Decision Making Amount and/or Complexity of Data Reviewed Independent Historian: EMS External Data Reviewed: notes. Labs: ordered. Decision-making details documented in ED Course. Radiology: ordered and independent interpretation performed. Decision-making details documented in ED Course. ECG/medicine tests: ordered and independent interpretation performed. Decision-making details documented in ED Course.   Pt with multiple medical problems and comorbidities and presenting today with a complaint that caries a high risk for morbidity and mortality.  Here today with near syncope.  Patient's symptoms have now completely resolved after receiving fluids by EMS.  Vital signs are normal here.  Patient has no focal findings on his exam.  Patient has a prior history of alcohol use but has not drank in 20 years.  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  Suspect most likely some dehydration.  Lower suspicion for dysrhythmia or ACS.  I independently interpreted patient's EKG and labs.  EKG today without evidence of Brugada's, prolonged QT, shortened PR interval or findings of dysrhythmia.  CBC, troponin are both within normal limits.  CMP within normal limits except for a potassium of 5.2 but this was with hemolysis.  Patient has a normal creatinine and low suspicion he has abnormal potassium.  I have independently visualized and interpreted pt's images today.  CXR wnl.  Patient was able to stand and walk here without difficulty.  Feel that patient is stable for  discharge home.  Given return precautions.  Findings discussed with the patient and his wife and they are comfortable with this plan.        Final Clinical Impression(s) / ED Diagnoses Final diagnoses:  Dehydration  Orthostatic hypotension    Rx / DC Orders ED Discharge Orders     None         Gwyneth Sprout, MD 09/18/21 1526

## 2021-09-18 NOTE — ED Notes (Signed)
Ambulated pt down hallway. Oxygen was 99 or 100.

## 2021-09-18 NOTE — ED Triage Notes (Signed)
Pt BIB EMS from home, c/o dizziness and weakness. Pt stayed up all night working. Woke up around 10am, upon standing felt dizzy, lightheaded, and slightly nausea. Supine BP 142/88, to standing BP 112/90. 1 liter NS EMS placed 20 gauge LFA, denies chest pain.

## 2021-09-23 ENCOUNTER — Encounter (INDEPENDENT_AMBULATORY_CARE_PROVIDER_SITE_OTHER): Payer: Self-pay | Admitting: Bariatrics

## 2021-09-23 ENCOUNTER — Ambulatory Visit (INDEPENDENT_AMBULATORY_CARE_PROVIDER_SITE_OTHER): Payer: 59 | Admitting: Bariatrics

## 2021-09-23 VITALS — BP 119/78 | HR 67 | Temp 97.5°F | Ht 70.0 in | Wt 232.0 lb

## 2021-09-23 DIAGNOSIS — E559 Vitamin D deficiency, unspecified: Secondary | ICD-10-CM

## 2021-09-23 DIAGNOSIS — Z6833 Body mass index (BMI) 33.0-33.9, adult: Secondary | ICD-10-CM | POA: Diagnosis not present

## 2021-09-23 DIAGNOSIS — E1169 Type 2 diabetes mellitus with other specified complication: Secondary | ICD-10-CM

## 2021-09-23 DIAGNOSIS — E669 Obesity, unspecified: Secondary | ICD-10-CM

## 2021-09-23 DIAGNOSIS — Z7985 Long-term (current) use of injectable non-insulin antidiabetic drugs: Secondary | ICD-10-CM

## 2021-09-23 HISTORY — DX: Obesity, unspecified: E11.69

## 2021-09-23 MED ORDER — VITAMIN D (ERGOCALCIFEROL) 1.25 MG (50000 UNIT) PO CAPS: 50000.0000 [IU] | ORAL_CAPSULE | ORAL | 0 refills | Status: DC

## 2021-09-23 MED ORDER — SEMAGLUTIDE (1 MG/DOSE) 4 MG/3ML ~~LOC~~ SOPN
1.0000 mg | PEN_INJECTOR | SUBCUTANEOUS | 0 refills | Status: DC
Start: 2021-09-23 — End: 2021-10-29

## 2021-09-24 ENCOUNTER — Other Ambulatory Visit (INDEPENDENT_AMBULATORY_CARE_PROVIDER_SITE_OTHER): Payer: Self-pay | Admitting: Bariatrics

## 2021-09-24 DIAGNOSIS — E1169 Type 2 diabetes mellitus with other specified complication: Secondary | ICD-10-CM

## 2021-09-25 ENCOUNTER — Encounter (INDEPENDENT_AMBULATORY_CARE_PROVIDER_SITE_OTHER): Payer: Self-pay

## 2021-09-29 ENCOUNTER — Ambulatory Visit
Admission: EM | Admit: 2021-09-29 | Discharge: 2021-09-29 | Disposition: A | Discharge status: Home / Self Care | Payer: 59 | Attending: Physician Assistant | Admitting: Physician Assistant

## 2021-09-29 DIAGNOSIS — Z20822 Contact with and (suspected) exposure to covid-19: Secondary | ICD-10-CM | POA: Diagnosis present

## 2021-09-29 LAB — SARS CORONAVIRUS 2 (TAT 6-24 HRS): SARS Coronavirus 2: NEGATIVE

## 2021-09-29 NOTE — ED Provider Notes (Signed)
[facility]    CSN: [id number] Arrival date & time: 09/29/21  1301      History   Chief Complaint Chief Complaint  Patient presents with   Covid Exposure    HPI Marvin [name] is a 71 y.o. male.   Patient presents today requesting COVID testing.  He has a household sick contact who is tested positive for COVID.  He is currently asymptomatic and denies any fever, chest pain, shortness of breath, sore throat, congestion, nausea, vomiting.  He has had all of his COVID vaccines.  He has had COVID in the past (most recent episode was in 2020).  He would be interested in antiviral medication if appropriate should he test positive.  He does have several risk factors including age, obesity, hypertension, prediabetes.    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   S/P hernia repair 03/15/2020   Primary osteoarthritis of right hip 10/03/2019   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   Chronic bilateral low back pain without sciatica 02/21/2019   Type 2 diabetes mellitus without complication, without long-term current use of insulin (HCC) 08/25/2017   Hyperuricemia 08/25/2017   Acid reflux 02/23/2017   Gout 05/20/2016   HTN (hypertension) 09/11/2015   Hyperlipidemia 09/11/2015    Past Surgical History:  Procedure Laterality Date   HERNIA REPAIR      twice   left shoulder rotator cuff repair      REPLACEMENT TOTAL KNEE     partial   SHOULDER SURGERY     right   TOTAL HIP ARTHROPLASTY Right 10/03/2019   Procedure: RIGHT TOTAL HIP ARTHROPLASTY ANTERIOR APPROACH;  Surgeon: Tarry Kos, MD;  Location: [facility];  Service: Orthopedics;  Laterality: Right;       Home Medications    Prior to Admission medications   Medication Sig Start Date End Date Taking? Authorizing Provider  allopurinol (ZYLOPRIM) 100 MG tablet TAKE ONE TABLET BY MOUTH TWICE A DAY. 05/13/21   Nche, Bonna Gains, NP  amLODipine (NORVASC) 10 MG tablet Take 1 tablet (10 mg total) by mouth daily. 05/13/21   Nche, Bonna Gains, NP  aspirin EC 81 MG tablet Take 1 tablet (81 mg total) by mouth 2 (two) times daily. Patient taking differently: Take 81 mg by mouth daily. 10/03/19   Cristie Hem, PA-C  colchicine 0.6 MG tablet Take 1.2 mg by mouth daily as needed (gout).    [provider]  cyclobenzaprine (FLEXERIL) 10 MG tablet TAKE ONE-HALF TABLET BY MOUTH EVERY NIGHT AT BEDTIME AS NEEDED FOR MUSCLE SPASMS 09/03/20   Worthy Rancher B, FNP  fluticasone (FLONASE) 50 MCG/ACT nasal spray SPRAY TWO SPRAYS IN EACH NOSTRIL ONCE DAILY  06/18/21   Nche, Bonna Gains, NP  Semaglutide, 1 MG/DOSE, 4 MG/3ML SOPN Inject 1 mg as directed once a week. 09/23/21   Corinna Capra A, DO  simvastatin (ZOCOR) 20 MG tablet Take 1 tablet (20 mg total) by mouth 2 (two) times a week. 05/13/21   Nche, Bonna Gains, NP  Vitamin D, Ergocalciferol, (DRISDOL) 1.25 MG (50000 UNIT) CAPS capsule Take 1 capsule (50,000 Units total) by mouth every 7 (seven) days. 09/23/21   Roswell Nickel, DO    Family History Family History  Problem Relation Age of Onset   Healthy Mother    Healthy Father    Cancer Brother        bone cancer   Cancer Maternal Aunt        breast cancer   Cancer Maternal Uncle        lukemia   Cancer Cousin        unknown   Cancer Maternal Uncle        lukemia   Cancer Paternal  Uncle        prostate   Hearing loss Maternal Grandfather 69    Social History Social History   Tobacco Use   Smoking status: Former    Types: Cigarettes    Quit date: 03/20/1998    Years since quitting: 23.5   Smokeless tobacco: Former    Types: Associate Professor Use: Never used  Substance Use Topics   Alcohol use: Not Currently    Comment: hx of 23 years ago   Drug use: Not Currently    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  Respiratory:  Negative for cough and shortness of breath.   Cardiovascular:  Negative for chest pain.  Gastrointestinal:  Negative for abdominal pain, diarrhea, nausea and vomiting.  Neurological:  Negative for dizziness, light-headedness and headaches.     Physical Exam Triage Vital Signs ED Triage Vitals  Enc Vitals Group     BP 09/29/21 1344 (!) 148/89     Pulse Rate 09/29/21 1344 70     Resp 09/29/21 1344 18     Temp 09/29/21 1344 97.8 F (36.6 C)     Temp Source 09/29/21 1344 Oral     SpO2 09/29/21 1344 98 %     Weight --      Height --      Head Circumference --      Peak Flow --      Pain Score 09/29/21 1345 0     Pain Loc --      Pain Edu? --      Excl. in GC? --    No data found.  Updated Vital Signs BP (!) 148/89 (BP Location: Right Arm)   Pulse 70   Temp 97.8 F (36.6 C) (Oral)   Resp 18   SpO2 98%   Visual Acuity Right Eye Distance:   Left Eye Distance:   Bilateral Distance:    Right Eye Near:   Left Eye Near:    Bilateral Near:     Physical Exam Vitals reviewed.  Constitutional:      General: He is awake.     Appearance: Normal appearance. He is well-developed. He is not ill-appearing.     Comments: Very pleasant male appears stated age in  no acute distress sitting comfortably in exam room  HENT:     Head:  Normocephalic and atraumatic.     Right Ear: External ear normal.     Left Ear: External ear normal.     Nose: Nose normal.  Cardiovascular:     Rate and Rhythm: Normal rate and regular rhythm.     Heart sounds: Normal heart sounds, S1 normal and S2 normal.  Pulmonary:     Effort: Pulmonary effort is normal. No accessory muscle usage or respiratory distress.     Breath sounds: Normal breath sounds. No stridor. No wheezing, rhonchi or rales.     Comments: Clear to auscultation bilaterally Neurological:     Mental Status: He is alert.  Psychiatric:        Behavior: Behavior is cooperative.      UC Treatments / Results  Labs (all labs ordered are listed, but only abnormal results are displayed) Labs Reviewed  SARS CORONAVIRUS 2 (TAT 6-24 HRS)    EKG   Radiology No results found.  Procedures Procedures (including critical care time)  Medications Ordered in UC Medications - No data to display  Initial Impression / Assessment and Plan / UC Course  I have reviewed the triage vital signs and the nursing notes.  Pertinent labs & imaging results that were available during my care of the patient were reviewed by me and considered in my medical decision making (see chart for details).     COVID testing obtained today.  We will contact him if he is positive.  He would be a candidate for antivirals if he develops any symptoms.  He had recent blood work 09/18/2021 with GFR greater than 60.  There are several medications that would interfere with his Paxlovid.  He would need to hold his colchicine and simvastatin while on the medication for 2 to 3 days after completing course of Paxlovid.  He would also need to hold his amlodipine while on medicine.  He is to rest and drink plenty fluid.  He develops any severe symptoms including nausea, vomiting, fever, chest pain, shortness of breath he needs to go to the emergency room.  Final Clinical Impressions(s) / UC Diagnoses   Final  diagnoses:  Close exposure to COVID-19 virus     Discharge Instructions      We will contact you if your test is positive.  If you start symptoms you may be a candidate for Paxlovid as we discussed.  If you decide to take this medication you need to hold your colchicine and simvastatin while on the medication and for several days after completing course.  You would also need to hold your amlodipine while on the medication but can start it immediately after you finish Paxlovid.  Please contact if you develop any symptoms.  If you have any severe symptoms including shortness of breath, fever, nausea, vomiting you need to be seen immediately.     ED Prescriptions   None    PDMP not reviewed this encounter.   Jeani Hawking, PA-C 09/29/21 1413

## 2021-09-29 NOTE — Discharge Instructions (Addendum)
We will contact you if your test is positive.  If you start symptoms you may be a candidate for Paxlovid as we discussed.  If you decide to take this medication you need to hold your colchicine and simvastatin while on the medication and for several days after completing course.  You would also need to hold your amlodipine while on the medication but can start it immediately after you finish Paxlovid.  Please contact if you develop any symptoms.  If you have any severe symptoms including shortness of breath, fever, nausea, vomiting you need to be seen immediately.

## 2021-09-29 NOTE — ED Triage Notes (Signed)
Pt c/o covid exposure without sxs.

## 2021-10-01 ENCOUNTER — Encounter (INDEPENDENT_AMBULATORY_CARE_PROVIDER_SITE_OTHER): Payer: Self-pay | Admitting: Bariatrics

## 2021-10-01 NOTE — Progress Notes (Signed)
Chief Complaint:   OBESITY Marvin [name] is here to discuss his progress with his obesity treatment plan along with follow-up of his obesity related diagnoses. Marvin [name] is on the Category 2 Plan and states he is following his eating plan approximately 70% of the time. Marvin [name] states he is doing 0 minutes 0 times per week.  Today's visit was #: 19 Starting weight: 242 lbs Starting date: 07/04/2020 Today's weight: 232 lbs Today's date: 09/23/2021 Total lbs lost to date: 10 Total lbs lost since last in-office visit: 0  Interim History: Marvin [name]'s weight remains the same.   Subjective:   1. Diabetes mellitus type 2 in obese [facility]) [facility]ior is tolerating Ozempic well.   2. Vitamin D deficiency Kemper is taking Vitamin D.  Assessment/Plan:   1. Diabetes mellitus type 2 in obese [facility]) Marvin [name] will continue Ozempic 1 mg once weekly, and we will refill for 1 month.   - Semaglutide, 1 MG/DOSE, 4 MG/3ML SOPN; Inject 1 mg as directed once a week.  Dispense: 6 mL; Refill: 0  2. Vitamin D deficiency Marvin [name] will continue Vitamin D 50,000 IU once weekly, and we will refill for 90 days.  - Vitamin D, Ergocalciferol, (DRISDOL) 1.25 MG (50000 UNIT) CAPS capsule; Take 1 capsule (50,000 Units total) by mouth every 7 (seven) days.  Dispense: 12 capsule; Refill: 0  3. Obesity, Current BMI 33.3 Marvin [name] is currently in the action stage of change. As such, his goal is to continue with weight loss efforts. He has agreed to the Category 2 Plan.   Meal planning and intentional eating were discussed.  Exercise goals: No exercise has been prescribed at this time.  Behavioral modification strategies: increasing lean protein intake, decreasing simple carbohydrates, increasing vegetables, increasing water intake, decreasing eating out, no skipping meals, meal planning and cooking strategies, keeping healthy foods in the home, and planning for success.  Marvin [name] has agreed to follow-up with our clinic in 4  weeks. He was informed of the importance of frequent follow-up visits to maximize his success with intensive lifestyle modifications for his multiple health conditions.   Objective:   Blood pressure 119/78, pulse 67, temperature (!) 97.5 F (36.4 C), height 5\' 10"  (1.778 m), weight 232 lb (105.2 kg), SpO2 97 %. Body mass index is 33.29 kg/m.  General: Cooperative, alert, well developed, in no acute distress. HEENT: Conjunctivae and lids unremarkable. Cardiovascular: Regular rhythm.  Lungs: Normal work of breathing. Neurologic: No focal deficits.   Lab Results  Component Value Date   CREATININE 0.85 09/18/2021   BUN 13 09/18/2021   NA 139 09/18/2021   K 5.2 (H) 09/18/2021   CL 113 (H) 09/18/2021   CO2 20 (L) 09/18/2021   Lab Results  Component Value Date   ALT 15 09/18/2021   AST 33 09/18/2021   ALKPHOS 63 09/18/2021   BILITOT 0.8 09/18/2021   Lab Results  Component Value Date   HGBA1C 5.8 (H) 03/11/2021   HGBA1C 5.9 (H) 10/23/2020   HGBA1C 6.4 05/11/2020   HGBA1C 6.3 (H) 09/30/2019   HGBA1C 6.2 03/01/2019   Lab Results  Component Value Date   INSULIN 11.7 10/23/2020   INSULIN 10.6 07/04/2020   Lab Results  Component Value Date   TSH 2.170 07/04/2020   Lab Results  Component Value Date   CHOL 182 05/13/2021   HDL 40.80 05/13/2021   LDLCALC 120 (H) 05/13/2021   TRIG 105.0 05/13/2021   CHOLHDL 4 05/13/2021   Lab Results  Component Value Date  VD25OH 42.4 10/23/2020   VD25OH 7.3 (L) 07/04/2020   Lab Results  Component Value Date   WBC 9.0 09/18/2021   HGB 13.8 09/18/2021   HCT 41.1 09/18/2021   MCV 82.7 09/18/2021   PLT 190 09/18/2021   No results found for: "IRON", "TIBC", "FERRITIN"  Attestation Statements:   Reviewed by clinician on day of visit: allergies, medications, problem list, medical history, surgical history, family history, social history, and previous encounter notes.   Marvin [name], am acting as Energy manager for [employer], DO.  I have reviewed the above documentation for accuracy and completeness, and I agree with the above. Corinna Capra, DO

## 2021-10-10 LAB — HM DIABETES EYE EXAM

## 2021-10-23 ENCOUNTER — Telehealth: Payer: Self-pay | Admitting: Nurse Practitioner

## 2021-10-23 NOTE — Telephone Encounter (Signed)
Left message for patient to call back and schedule Medicare Annual Wellness Visit (AWV).   Please offer to do virtually or by telephone.  Left office number and my jabber #[phone].  Last AWV:08/10/2019  Please schedule at anytime with Nurse Health Advisor.   

## 2021-10-24 ENCOUNTER — Ambulatory Visit (INDEPENDENT_AMBULATORY_CARE_PROVIDER_SITE_OTHER): Payer: 59 | Admitting: Bariatrics

## 2021-10-29 ENCOUNTER — Encounter: Payer: Self-pay | Admitting: Bariatrics

## 2021-10-29 ENCOUNTER — Ambulatory Visit (INDEPENDENT_AMBULATORY_CARE_PROVIDER_SITE_OTHER): Payer: 59 | Admitting: Bariatrics

## 2021-10-29 VITALS — BP 126/77 | HR 72 | Temp 97.7°F | Ht 70.0 in | Wt 234.0 lb

## 2021-10-29 DIAGNOSIS — E1169 Type 2 diabetes mellitus with other specified complication: Secondary | ICD-10-CM | POA: Diagnosis not present

## 2021-10-29 DIAGNOSIS — E559 Vitamin D deficiency, unspecified: Secondary | ICD-10-CM | POA: Diagnosis not present

## 2021-10-29 DIAGNOSIS — E669 Obesity, unspecified: Secondary | ICD-10-CM | POA: Diagnosis not present

## 2021-10-29 DIAGNOSIS — Z6833 Body mass index (BMI) 33.0-33.9, adult: Secondary | ICD-10-CM | POA: Diagnosis not present

## 2021-10-29 DIAGNOSIS — Z7985 Long-term (current) use of injectable non-insulin antidiabetic drugs: Secondary | ICD-10-CM

## 2021-10-29 MED ORDER — VITAMIN D (ERGOCALCIFEROL) 1.25 MG (50000 UNIT) PO CAPS: 50000.0000 [IU] | ORAL_CAPSULE | ORAL | 0 refills | Status: DC

## 2021-10-29 MED ORDER — SEMAGLUTIDE (1 MG/DOSE) 4 MG/3ML ~~LOC~~ SOPN: 1.0000 mg | PEN_INJECTOR | SUBCUTANEOUS | 0 refills | Status: DC

## 2021-10-29 NOTE — Progress Notes (Deleted)
Chief Complaint:   OBESITY Marvin [name] is here to discuss his progress with his obesity treatment plan along with follow-up of his obesity related diagnoses.   Today's visit was #: 20 Starting weight: 242 lbs Starting date: 07/04/2020 Today's weight:  Last Weight  [name] recent update: 10/29/2021 11:42 AM    Weight  106.1 kg (234 lb)             Today's date: 10/29/2021 Weight change since last visit: 0  Total lbs lost to date: 8 lbs Body mass index is 33.58 kg/m.  Total weight loss percentage to date: ***  Current Meal Plan: the Category 2 Plan for 70% of the time.  Current Exercise Plan: 0 for 0 minutes 0 times per week. Current Anti-Obesity Medications: ***. Side effects: ***.  Interim History:***  Assessment/Plan:   1. Vitamin D deficiency *** - Vitamin D, Ergocalciferol, (DRISDOL) 1.25 MG (50000 UNIT) CAPS capsule; Take 1 capsule (50,000 Units total) by mouth every 7 (seven) days.  Dispense: 12 capsule; Refill: 0  2. Diabetes mellitus type 2 in obese Marvin [name]) ***  Course: Marvin [name] is currently in the action stage of change. As such, his goal is to continue with weight loss efforts.   Nutrition goals: He has agreed to {MWMwtlossportion/plan2:23431}.   Exercise goals: {MWM EXERCISE RECS:23473}  Behavioral modification strategies: {MWMwtlossdietstrategies3:23432}.  Marvin [name] has agreed to follow-up with our clinic in {NUMBER 1-10:22536} weeks. He was informed of the importance of frequent follow-up visits to maximize his success with intensive lifestyle modifications for his multiple health conditions.   ***delete paragraph if no labs orderedCharles was informed we would discuss his lab results at his next visit unless there is a critical issue that needs to be addressed sooner. Marvin [name] agreed to keep his next visit at the agreed upon time to discuss these results.  Objective:   Vitals:   10/29/21 1100  BP:  126/77  Pulse: 72  Temp: 97.7 F (36.5 C)  Height: 5\' 10"  (1.778 m)  Weight: 234 lb (106.1 kg)  SpO2: 98%  BMI (Calculated): 33.58     General: Cooperative, alert, well developed, in no acute distress. HEENT: Conjunctivae and lids unremarkable. Cardiovascular: Regular rhythm.  Lungs: Normal work of breathing. Neurologic: No focal deficits.   Lab Results  Component Value Date   CREATININE 0.85 09/18/2021   BUN 13 09/18/2021   NA 139 09/18/2021   K 5.2 (H) 09/18/2021   CL 113 (H) 09/18/2021   CO2 20 (L) 09/18/2021   Lab Results  Component Value Date   ALT 15 09/18/2021   AST 33 09/18/2021   ALKPHOS 63 09/18/2021   BILITOT 0.8 09/18/2021   Lab Results  Component Value Date   HGBA1C 5.8 (H) 03/11/2021   HGBA1C 5.9 (H) 10/23/2020   HGBA1C 6.4 05/11/2020   HGBA1C 6.3 (H) 09/30/2019   HGBA1C 6.2 03/01/2019   Lab Results  Component Value Date   INSULIN 11.7 10/23/2020   INSULIN 10.6 07/04/2020   Lab Results  Component Value Date   TSH 2.170 07/04/2020   Lab Results  Component Value Date   CHOL 182 05/13/2021   HDL 40.80 05/13/2021  LDLCALC 120 (H) 05/13/2021   TRIG 105.0 05/13/2021   CHOLHDL 4 05/13/2021   Lab Results  Component Value Date   VD25OH 42.4 10/23/2020   VD25OH 7.3 (L) 07/04/2020   Lab Results  Component Value Date   WBC 9.0 09/18/2021   HGB 13.8 09/18/2021   HCT 41.1 09/18/2021   MCV 82.7 09/18/2021   PLT 190 09/18/2021   No results found for: "IRON", "TIBC", "FERRITIN"  Obesity Behavioral Intervention:   Approximately 15 minutes were spent on the discussion below.  ASK: We discussed the diagnosis of obesity with Marvin [name] today and Marvin [name] agreed to give [place] permission to discuss obesity behavioral modification therapy today.  ASSESS: Marvin [name] has the diagnosis of obesity and his BMI today is ***. Marvin [name] {ACTION; IS/IS JGO:[id number]} in the action stage of change.   ADVISE: Marvin [name] was educated on the multiple health risks of  obesity as well as the benefit of weight loss to improve his health. He was advised of the need for long term treatment and the importance of lifestyle modifications to improve his current health and to decrease his risk of future health problems.  AGREE: Multiple dietary modification options and treatment options were discussed and Marvin [name] agreed to follow the recommendations documented in the above note.  ARRANGE: Marvin [name] was educated on the importance of frequent visits to treat obesity as outlined per CMS and USPSTF guidelines and agreed to schedule his next follow up appointment today.  Attestation Statements:   Reviewed by clinician on day of visit: allergies, medications, problem list, medical history, surgical history, family history, social history, and previous encounter notes.  ***(delete if time-based billing not used)Time spent on visit including pre-visit chart review and post-visit care and charting was *** minutes.   I, ***, am acting as transcriptionist for ***.  I have reviewed the above documentation for accuracy and completeness, and I agree with the above. -  ***

## 2021-11-05 NOTE — Progress Notes (Unsigned)
Chief Complaint:   OBESITY Marvin [name] is here to discuss his progress with his obesity treatment plan along with follow-up of his obesity related diagnoses. Marvin [name] is on the Category 2 Plan and states he is following his eating plan approximately 70% of the time. Marvin [name] states he is doing 0 minutes 0 times per week.  Today's visit was #: 20 Starting weight: 242 lbs Starting date: 07/04/2020 Today's weight: 234 lbs Today's date: 10/29/2021 Total lbs lost to date: 8 Total lbs lost since last in-office visit: 0  Interim History: Marvin [name] is up 2 lbs since his last visit. He is up about 5 lbs in water weight per the bioimpedance scale.   Subjective:   1. Vitamin D deficiency Marvin [name] is taking Vit D prescription.   2. Diabetes mellitus type 2 in obese [facility]) Marvin [name] is taking Ozempic  Assessment/Plan:   1. Vitamin D deficiency We will refill prescription Vitamin D for 90 days. [name] will follow-up for routine testing of Vitamin D, at least 2-3 times per year to avoid over-replacement.  - Vitamin D, Ergocalciferol, (DRISDOL) 1.25 MG (50000 UNIT) CAPS capsule; Take 1 capsule (50,000 Units total) by mouth every 7 (seven) days.  Dispense: 12 capsule; Refill: 0  2. Diabetes mellitus type 2 in obese [facility]) [name] will continue Ozempic 1 mg once weekly as directed, and we will refill for 1 month.  - Semaglutide, 1 MG/DOSE, 4 MG/3ML SOPN; Inject 1 mg as directed once a week.  Dispense: 3 mL; Refill: 0  3. Obesity, Current BMI 33.6 Marvin [name] is currently in the action stage of change. As such, his goal is to continue with weight loss efforts. He has agreed to the Category 2 Plan.   He will be more active. Meal planning was discussed. He will heep his protein and water intake high.   Exercise goals: Active at work.   Behavioral modification strategies: increasing lean protein intake, decreasing simple carbohydrates, increasing vegetables, increasing water intake, decreasing eating out,  no skipping meals, meal planning and cooking strategies, keeping healthy foods in the home, and planning for success.  Marvin [name] has agreed to follow-up with our clinic in 4 weeks. He was informed of the importance of frequent follow-up visits to maximize his success with intensive lifestyle modifications for his multiple health conditions.   Objective:   Blood pressure 126/77, pulse 72, temperature 97.7 F (36.5 C), height 5\' 10"  (1.778 m), weight 234 lb (106.1 kg), SpO2 98 %. Body mass index is 33.58 kg/m.  General: Cooperative, alert, well developed, in no acute distress. HEENT: Conjunctivae and lids unremarkable. Cardiovascular: Regular rhythm.  Lungs: Normal work of breathing. Neurologic: No focal deficits.   Lab Results  Component Value Date   CREATININE 0.85 09/18/2021   BUN 13 09/18/2021   NA 139 09/18/2021   K 5.2 (H) 09/18/2021   CL 113 (H) 09/18/2021   CO2 20 (L) 09/18/2021   Lab Results  Component Value Date   ALT 15 09/18/2021   AST 33 09/18/2021   ALKPHOS 63 09/18/2021   BILITOT 0.8 09/18/2021   Lab Results  Component Value Date   HGBA1C 5.8 (H) 03/11/2021   HGBA1C 5.9 (H) 10/23/2020   HGBA1C 6.4 05/11/2020   HGBA1C 6.3 (H) 09/30/2019   HGBA1C 6.2 03/01/2019   Lab Results  Component Value Date   INSULIN 11.7 10/23/2020   INSULIN 10.6 07/04/2020   Lab Results  Component Value Date   TSH 2.170 07/04/2020   Lab Results  Component Value  Date   CHOL 182 05/13/2021   HDL 40.80 05/13/2021   LDLCALC 120 (H) 05/13/2021   TRIG 105.0 05/13/2021   CHOLHDL 4 05/13/2021   Lab Results  Component Value Date   VD25OH 42.4 10/23/2020   VD25OH 7.3 (L) 07/04/2020   Lab Results  Component Value Date   WBC 9.0 09/18/2021   HGB 13.8 09/18/2021   HCT 41.1 09/18/2021   MCV 82.7 09/18/2021   PLT 190 09/18/2021   No results found for: "IRON", "TIBC", "FERRITIN"  Attestation Statements:   Reviewed by clinician on day of visit: allergies, medications, problem  list, medical history, surgical history, family history, social history, and previous encounter notes.   Marvin [name], am acting as Location manager for [employer], DO.  I have reviewed the above documentation for accuracy and completeness, and I agree with the above. Marvin Lesch, DO

## 2021-11-06 ENCOUNTER — Encounter: Payer: Self-pay | Admitting: Bariatrics

## 2021-11-08 ENCOUNTER — Encounter: Payer: Self-pay | Admitting: Nurse Practitioner

## 2021-11-08 ENCOUNTER — Telehealth: Payer: Self-pay | Admitting: Nurse Practitioner

## 2021-11-08 ENCOUNTER — Ambulatory Visit (INDEPENDENT_AMBULATORY_CARE_PROVIDER_SITE_OTHER): Payer: 59 | Admitting: Nurse Practitioner

## 2021-11-08 VITALS — BP 118/76 | HR 64 | Temp 98.0°F | Ht 70.0 in | Wt 238.0 lb

## 2021-11-08 DIAGNOSIS — E790 Hyperuricemia without signs of inflammatory arthritis and tophaceous disease: Secondary | ICD-10-CM

## 2021-11-08 DIAGNOSIS — E785 Hyperlipidemia, unspecified: Secondary | ICD-10-CM

## 2021-11-08 DIAGNOSIS — E782 Mixed hyperlipidemia: Secondary | ICD-10-CM | POA: Diagnosis not present

## 2021-11-08 DIAGNOSIS — E1169 Type 2 diabetes mellitus with other specified complication: Secondary | ICD-10-CM

## 2021-11-08 DIAGNOSIS — I10 Essential (primary) hypertension: Secondary | ICD-10-CM

## 2021-11-08 DIAGNOSIS — E119 Type 2 diabetes mellitus without complications: Secondary | ICD-10-CM

## 2021-11-08 LAB — MICROALBUMIN / CREATININE URINE RATIO
Creatinine,U: 88.7 mg/dL
Microalb Creat Ratio: 0.8 mg/g (ref 0.0–30.0)
Microalb, Ur: <0.7 mg/dL (ref 0.0–1.9)

## 2021-11-08 LAB — RENAL FUNCTION PANEL
Albumin: 4.4 g/dL (ref 3.5–5.2)
BUN: 17 mg/dL (ref 6–23)
CO2: 26 mEq/L (ref 19–32)
Calcium: 9.2 mg/dL (ref 8.4–10.5)
Chloride: 106 mEq/L (ref 96–112)
Creatinine, Ser: 1.02 mg/dL (ref 0.40–1.50)
GFR: 75.39 mL/min (ref >60.00)
Glucose, Bld: 87 mg/dL (ref 70–99)
Phosphorus: 3.1 mg/dL (ref 2.3–4.6)
Potassium: 4 mEq/L (ref 3.5–5.1)
Sodium: 140 mEq/L (ref 135–145)

## 2021-11-08 LAB — LIPID PANEL
Cholesterol: 155 mg/dL (ref 0–200)
HDL: 41.4 mg/dL (ref >39.00)
LDL Cholesterol: 99 mg/dL (ref 0–99)
NonHDL: 113.5
Total CHOL/HDL Ratio: 4
Triglycerides: 74 mg/dL (ref 0.0–149.0)
VLDL: 14.8 mg/dL (ref 0.0–40.0)

## 2021-11-08 MED ORDER — ASPIRIN 81 MG PO TBEC: 81.0000 mg | DELAYED_RELEASE_TABLET | Freq: Every day | ORAL | 12 refills | Status: AC

## 2021-11-08 MED ORDER — SIMVASTATIN 20 MG PO TABS: 20.0000 mg | ORAL_TABLET | ORAL | 3 refills | Status: DC

## 2021-11-08 MED ORDER — ALLOPURINOL 100 MG PO TABS: 100.0000 mg | ORAL_TABLET | Freq: Every day | ORAL | 3 refills | Status: DC

## 2021-11-08 MED ORDER — AMLODIPINE BESYLATE 10 MG PO TABS
10.0000 mg | ORAL_TABLET | Freq: Every day | ORAL | 3 refills | Status: DC
Start: 2021-11-08 — End: 2022-05-12

## 2021-11-08 NOTE — Telephone Encounter (Signed)
New script sent in with correct directions

## 2021-11-08 NOTE — Assessment & Plan Note (Signed)
Repeat lipid panel No adverse effect with zocor

## 2021-11-08 NOTE — Assessment & Plan Note (Signed)
BP at goal with amlodipine BP Readings from Last 3 Encounters:  11/08/21 118/76  10/29/21 126/77  09/29/21 (!) 148/89   Maintain med dose Repeat BMP

## 2021-11-08 NOTE — Patient Instructions (Signed)
Go to lab

## 2021-11-08 NOTE — Assessment & Plan Note (Signed)
Repeat hgbA1c No adverse effect with ozempic 1mg  weekly: managed by weight and wellness clinic provider

## 2021-11-08 NOTE — Progress Notes (Signed)
Established Patient Visit  Patient: Marvin [name]   DOB: 1950-02-21   71 y.o. Male  MRN: [id number] Visit Date: 11/08/2021  Subjective:    Chief Complaint  Patient presents with   Office Visit    DM/ HTN/ Hyperlipidemia  Doesn't check BP or BS  Pt fasting  Requesting records for eye exam    HPI Hyperuricemia He decreased allopurinol dose to 148m daily Denies any acute gout exacerbation Last uric acid level at 7.2 04/2021  Maintain allopurinol dose Repeat BMP  Hyperlipidemia associated with type 2 diabetes mellitus ([place] Repeat lipid panel No adverse effect with zocor  Type 2 diabetes mellitus without complication, without long-term current use of insulin (HCC) Repeat hgbA1c No adverse effect with ozempic 175mweekly: managed by weight and wellness clinic provider  HTN (hypertension) BP at goal with amlodipine BP Readings from Last 3 Encounters:  11/08/21 118/76  10/29/21 126/77  09/29/21 (!) 148/89   Maintain med dose Repeat BMP  Wt Readings from Last 3 Encounters:  11/08/21 238 lb (108 kg)  10/29/21 234 lb (106.1 kg)  09/23/21 232 lb (105.2 kg)    BP Readings from Last 3 Encounters:  11/08/21 118/76  10/29/21 126/77  09/29/21 (!) 148/89    Reviewed medical, surgical, and social history today  Medications: Outpatient Medications Prior to Visit  Medication Sig   Semaglutide, 1 MG/DOSE, 4 MG/3ML SOPN Inject 1 mg as directed once a week.   Vitamin D, Ergocalciferol, (DRISDOL) 1.25 MG (50000 UNIT) CAPS capsule Take 1 capsule (50,000 Units total) by mouth every 7 (seven) days.   [DISCONTINUED] allopurinol (ZYLOPRIM) 100 MG tablet TAKE ONE TABLET BY MOUTH TWICE A DAY. (Patient taking differently: Take 100 mg by mouth daily. TAKE ONE TABLET BY MOUTH TWICE A DAY.)   [DISCONTINUED] amLODipine (NORVASC) 10 MG tablet Take 1 tablet (10 mg total) by mouth daily.   [DISCONTINUED] aspirin EC 81 MG tablet Take 1 tablet (81 mg total) by mouth 2 (two)  times daily. (Patient taking differently: Take 81 mg by mouth daily.)   [DISCONTINUED] simvastatin (ZOCOR) 20 MG tablet Take 1 tablet (20 mg total) by mouth 2 (two) times a week.   [DISCONTINUED] colchicine 0.6 MG tablet Take 1.2 mg by mouth daily as needed (gout). (Patient not taking: Reported on 11/08/2021)   [DISCONTINUED] cyclobenzaprine (FLEXERIL) 10 MG tablet TAKE ONE-HALF TABLET BY MOUTH EVERY NIGHT AT BEDTIME AS NEEDED FOR MUSCLE SPASMS (Patient not taking: Reported on 11/08/2021)   [DISCONTINUED] fluticasone (FLONASE) 50 MCG/ACT nasal spray SPRAY TWO SPRAYS IN EACH NOSTRIL ONCE DAILY (Patient not taking: Reported on 11/08/2021)   No facility-administered medications prior to visit.   Reviewed past medical and social history.   ROS per HPI above      Objective:  BP 118/76 (BP Location: Right Arm, Patient Position: Sitting, Cuff Size: Normal)   Pulse 64   Temp 98 F (36.7 C) (Temporal)   Ht '5\' 10"'  (1.778 m)   Wt 238 lb (108 kg)   SpO2 96%   BMI 34.15 kg/m      Physical Exam  No results found for any visits on 11/08/21.    Assessment & Plan:    Problem List Items Addressed This Visit       Cardiovascular and Mediastinum   HTN (hypertension)    BP at goal with amlodipine BP Readings from Last 3 Encounters:  11/08/21 118/76  10/29/21 126/77  09/29/21 (!) 148/89   Maintain med dose Repeat BMP      Relevant Medications   aspirin EC 81 MG tablet   amLODipine (NORVASC) 10 MG tablet   simvastatin (ZOCOR) 20 MG tablet (Start on 11/11/2021)   Other Relevant Orders   Renal function panel with eGFR     Endocrine   Hyperlipidemia associated with type 2 diabetes mellitus ([place])    Repeat lipid panel No adverse effect with zocor      Relevant Medications   aspirin EC 81 MG tablet   amLODipine (NORVASC) 10 MG tablet   simvastatin (ZOCOR) 20 MG tablet (Start on 11/11/2021)   Other Relevant Orders   Lipid panel   Type 2 diabetes mellitus without complication, without  long-term current use of insulin ([place]) - Primary    Repeat hgbA1c No adverse effect with ozempic 37m weekly: managed by weight and wellness clinic provider      Relevant Medications   aspirin EC 81 MG tablet   simvastatin (ZOCOR) 20 MG tablet (Start on 11/11/2021)   Other Relevant Orders   Urine microalbumin-creatinine with uACR   Renal function panel with eGFR   Hemoglobin A1c     Other   Hyperuricemia    He decreased allopurinol dose to 1023mdaily Denies any acute gout exacerbation Last uric acid level at 7.2 04/2021  Maintain allopurinol dose Repeat BMP      Relevant Medications   allopurinol (ZYLOPRIM) 100 MG tablet   Other Visit Diagnoses     Essential hypertension       Relevant Medications   aspirin EC 81 MG tablet   amLODipine (NORVASC) 10 MG tablet   simvastatin (ZOCOR) 20 MG tablet (Start on 11/11/2021)   Mixed hyperlipidemia       Relevant Medications   aspirin EC 81 MG tablet   amLODipine (NORVASC) 10 MG tablet   simvastatin (ZOCOR) 20 MG tablet (Start on 11/11/2021)      Return in about 6 months (around 05/09/2022) for DM, HTN, hyperlipidemia (fasting).     ChWilfred LacyNP

## 2021-11-08 NOTE — Telephone Encounter (Signed)
Caller Name: [place] Call back phone #: [phone]  Reason for Call: Allopurinol was sent in with 2 different instructions. Please call to clarify

## 2021-11-08 NOTE — Assessment & Plan Note (Addendum)
He decreased allopurinol dose to 100mg  daily Denies any acute gout exacerbation Last uric acid level at 7.2 04/2021  Maintain allopurinol dose Repeat BMP

## 2021-11-09 LAB — HEMOGLOBIN A1C
Hgb A1c MFr Bld: 5.7 % of total Hgb — ABNORMAL HIGH (ref <5.7)
Mean Plasma Glucose: 117 mg/dL
eAG (mmol/L): 6.5 mmol/L

## 2021-11-26 ENCOUNTER — Ambulatory Visit (INDEPENDENT_AMBULATORY_CARE_PROVIDER_SITE_OTHER): Payer: 59 | Admitting: Bariatrics

## 2021-11-26 ENCOUNTER — Encounter: Payer: Self-pay | Admitting: Bariatrics

## 2021-11-26 VITALS — HR 66 | Temp 97.4°F | Ht 70.0 in | Wt 236.0 lb

## 2021-11-26 DIAGNOSIS — E1169 Type 2 diabetes mellitus with other specified complication: Secondary | ICD-10-CM | POA: Diagnosis not present

## 2021-11-26 DIAGNOSIS — E559 Vitamin D deficiency, unspecified: Secondary | ICD-10-CM | POA: Diagnosis not present

## 2021-11-26 DIAGNOSIS — E669 Obesity, unspecified: Secondary | ICD-10-CM | POA: Insufficient documentation

## 2021-11-26 DIAGNOSIS — Z6833 Body mass index (BMI) 33.0-33.9, adult: Secondary | ICD-10-CM

## 2021-11-26 DIAGNOSIS — Z7985 Long-term (current) use of injectable non-insulin antidiabetic drugs: Secondary | ICD-10-CM

## 2021-11-26 MED ORDER — VITAMIN D (ERGOCALCIFEROL) 1.25 MG (50000 UNIT) PO CAPS: 50000.0000 [IU] | ORAL_CAPSULE | ORAL | 0 refills | Status: DC

## 2021-11-26 MED ORDER — SEMAGLUTIDE (1 MG/DOSE) 4 MG/3ML ~~LOC~~ SOPN
1.0000 mg | PEN_INJECTOR | SUBCUTANEOUS | 0 refills | Status: DC
Start: 2021-11-26 — End: 2021-12-24

## 2021-11-27 ENCOUNTER — Ambulatory Visit (INDEPENDENT_AMBULATORY_CARE_PROVIDER_SITE_OTHER): Payer: 59

## 2021-11-27 VITALS — Ht 71.0 in | Wt 234.0 lb

## 2021-11-27 DIAGNOSIS — Z0000 Encounter for general adult medical examination without abnormal findings: Secondary | ICD-10-CM | POA: Diagnosis not present

## 2021-11-27 NOTE — Progress Notes (Signed)
Subjective:   Marvin [name] is a 71 y.o. male who presents for Medicare Annual/Subsequent preventive examination. Virtual Visit via Telephone Note  I connected with  [name] on 11/27/21 at  9:45 AM EDT by telephone and verified that I am speaking with the correct person using two identifiers.  Location: Patient: Home  Provider: [facility]  Persons participating in the virtual visit: patient/Nurse Health Advisor   I discussed the limitations, risks, security and privacy concerns of performing an evaluation and management service by telephone and the availability of in person appointments. The patient expressed understanding and agreed to proceed.  Interactive audio and video telecommunications were attempted between this nurse and patient, however failed, due to patient having technical difficulties OR patient did not have access to video capability.  We continued and completed visit with audio only.  Some vital signs may be absent or patient reported.   Lorrene Reid, LPN  Review of Systems     Cardiac Risk Factors include: advanced age (>72men, >3 women);hypertension;male gender     Objective:    Today's Vitals   11/27/21 0957  Weight: 234 lb (106.1 kg)  Height: 5\' 11"  (1.803 m)   Body mass index is 32.64 kg/m.     11/27/2021   10:01 AM 09/18/2021    1:41 PM 03/11/2021    7:55 AM 11/08/2020    3:21 PM 09/18/2020    9:50 AM 10/03/2019    2:20 PM 09/30/2019    8:24 AM  Advanced Directives  Does Patient Have a Medical Advance Directive? No No Yes No No No No  Type of 10/02/2019;Living will      Would patient like information on creating a medical advance directive? No - Patient declined No - Patient declined  Yes (MAU/Ambulatory/Procedural Areas - Information given) No - Patient declined No - Patient declined     Current Medications (verified) Outpatient Encounter Medications as of 11/27/2021  Medication Sig    allopurinol (ZYLOPRIM) 100 MG tablet Take 1 tablet (100 mg total) by mouth daily.   amLODipine (NORVASC) 10 MG tablet Take 1 tablet (10 mg total) by mouth daily.   aspirin EC 81 MG tablet Take 1 tablet (81 mg total) by mouth daily. Swallow whole.   Semaglutide, 1 MG/DOSE, 4 MG/3ML SOPN Inject 1 mg as directed once a week.   simvastatin (ZOCOR) 20 MG tablet Take 1 tablet (20 mg total) by mouth 2 (two) times a week.   Vitamin D, Ergocalciferol, (DRISDOL) 1.25 MG (50000 UNIT) CAPS capsule Take 1 capsule (50,000 Units total) by mouth every 7 (seven) days.   No facility-administered encounter medications on file as of 11/27/2021.    Allergies (verified) Buprenorphine hcl and Morphine and related   History: Past Medical History:  Diagnosis Date   Alcohol abuse    Allergy    Arthritis    Back pain    Constipation 03/15/2020   Diabetes mellitus type 2 in obese (HCC) 09/23/2021   Drug abuse (HCC)    GERD (gastroesophageal reflux disease)    Gout    Gout 05/20/2016   Hernia    Hypercholesteremia    Hypertension    Joint pain    Obesity    Osteoarthritis    Pre-diabetes    Prediabetes    Stomach ulcer    Swallowing difficulty    Wears dentures    bottom front   Past Surgical History:  Procedure Laterality Date  HERNIA REPAIR     twice   left shoulder rotator cuff repair      REPLACEMENT TOTAL KNEE     partial   SHOULDER SURGERY     right   TOTAL HIP ARTHROPLASTY Right 10/03/2019   Procedure: RIGHT TOTAL HIP ARTHROPLASTY ANTERIOR APPROACH;  Surgeon: Tarry Kos, MD;  Location: [facility];  Service: Orthopedics;  Laterality: Right;   Family History  Problem Relation Age of Onset   Healthy Mother    Healthy Father    Cancer Brother        bone cancer   Cancer Maternal Aunt        breast cancer   Cancer Maternal Uncle        lukemia   Cancer Cousin        unknown   Cancer Maternal Uncle        lukemia   Cancer Paternal Uncle        prostate   Hearing loss Maternal  Grandfather 57   Social History   Socioeconomic History   Marital status: Married    Spouse name: Not on file   Number of children: Not on file   Years of education: Not on file   Highest education level: Not on file  Occupational History   Not on file  Tobacco Use   Smoking status: Former    Types: Cigarettes    Quit date: 03/20/1998    Years since quitting: 23.7   Smokeless tobacco: Former    Types: Associate Professor Use: Never used  Substance and Sexual Activity   Alcohol use: Not Currently    Comment: hx of 23 years ago   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  Other Topics Concern   Not on file  Social History Narrative   Not on file   Social Determinants of Health   Financial Resource Strain: Low Risk  (11/27/2021)   Overall Financial Resource Strain (CARDIA)    Difficulty of Paying Living Expenses: Not hard at all  Food Insecurity: No Food Insecurity (11/27/2021)   Hunger Vital Sign    Worried About Running Out of Food in the Last Year: Never true    Ran Out of Food in the Last Year: Never true  Transportation Needs: No Transportation Needs (11/27/2021)   PRAPARE - Administrator, Civil Service (Medical): No    Lack of Transportation (Non-Medical): No  Physical Activity: Sufficiently Active (11/27/2021)   Exercise Vital Sign    Days of Exercise per Week: 5 days    Minutes of Exercise per Session: 60 min  Stress: No Stress Concern Present (11/27/2021)   [employer] of Occupational Health - Occupational Stress Questionnaire    Feeling of Stress : Not at all  Social Connections: Socially Integrated (11/27/2021)   Social Connection and Isolation Panel [NHANES]    Frequency of Communication with Friends and Family: More than three times a week    Frequency of Social Gatherings with Friends and Family: More than three times a week    Attends Religious  Services: More than 4 times per year    Active Member of [employer] or Organizations: Yes    Attends Engineer, structural: More than 4 times per year    Marital Status: Married    Tobacco Counseling Counseling given: Not Answered   Clinical Intake:  Pre-visit preparation completed: Yes  Pain : No/denies pain     Nutritional Risks: None Diabetes: No  How often do you need to have someone help you when you read instructions, pamphlets, or other written materials from your doctor or pharmacy?: 1 - Never  Diabetic?no   Interpreter Needed?: No  Information entered by :: Renie Ora, LPN   Activities of Daily Living    11/27/2021   10:01 AM 11/24/2021    4:40 PM  In your present state of health, do you have any difficulty performing the following activities:  Hearing? 0 0  Vision? 0 0  Difficulty concentrating or making decisions? 0 0  Walking or climbing stairs? 0 0  Dressing or bathing? 0 0  Doing errands, shopping? 0 0  Preparing Food and eating ? N N  Using the Toilet? N N  In the past six months, have you accidently leaked urine? N N  Do you have problems with loss of bowel control? N N  Managing your Medications? N N  Managing your Finances? N N  Housekeeping or managing your Housekeeping? N N    Patient Care Team: [place], Bonna Gains, NP as PCP - [facility], [facility]  Indicate any recent Medical Services you may have received from other than [facility] providers in the past year (date may be approximate).     Assessment:   This is a routine wellness examination for [place].  Hearing/Vision screen Vision Screening - Comments:: Annual eye exam wear glasses   Dietary issues and exercise activities discussed: Current Exercise Habits: Home exercise routine, Type of exercise: walking;strength training/weights, Time (Minutes): 30, Frequency (Times/Week): 5, Weekly Exercise (Minutes/Week): 150, Intensity: Mild,  Exercise limited by: None identified   Goals Addressed             This Visit's Progress    DIET - REDUCE SODIUM INTAKE   On track    Obtain Annual Eye (retinal)  Exam    On track    Patient Stated   On track    Eat a healthier diet, increase excercise       Depression Screen    11/27/2021    9:59 AM 05/13/2021    8:06 AM 11/08/2020   11:05 AM 09/18/2020    9:56 AM 07/04/2020    7:59 AM 08/10/2019   10:42 AM 02/21/2019   10:30 AM  PHQ 2/9 Scores  PHQ - 2 Score 0 0 0 0 1 0 0  PHQ- 9 Score   1  3      Fall Risk    11/27/2021    9:58 AM 11/24/2021    4:40 PM 11/08/2021    9:07 AM 09/18/2020    9:57 AM 05/10/2020   10:28 AM  Fall Risk   Falls in the past year? 0 0 0 0 0  Number falls in past yr: 0  0 0 0  Injury with Fall? 0 0 0 0 0  Risk for fall due to : No Fall Risks      Follow up Falls prevention discussed   Falls evaluation completed;Falls prevention discussed     FALL RISK PREVENTION PERTAINING TO THE HOME:  Any stairs in or around the home? No  If so, are there any without handrails? No  Home free of loose throw rugs in walkways, pet beds, electrical cords, etc? Yes  Adequate lighting in your home to reduce risk of falls? Yes   ASSISTIVE DEVICES UTILIZED  TO PREVENT FALLS:  Life alert? No  Use of a cane, walker or w/c? No  Grab bars in the bathroom? Yes  Shower chair or bench in shower? Yes  Elevated toilet seat or a handicapped toilet? Yes        11/27/2021   10:02 AM 08/10/2019   10:44 AM  6CIT Screen  What Year? 0 points 0 points  What month? 0 points 0 points  What time? 0 points 0 points  Count back from 20 0 points 0 points  Months in reverse 0 points 0 points  Repeat phrase 0 points 0 points  Total Score 0 points 0 points    Immunizations Immunization History  Administered Date(s) Administered   Fluad Quad(high Dose 65+) 11/11/2019   Influenza, High Dose Seasonal PF 12/28/2016, 10/14/2018, 10/12/2021   Influenza,inj,Quad PF,6+ Mos  11/27/2016   Influenza-Unspecified 11/12/2015, 11/17/2017, 10/29/2020   Moderna SARS-COV2 Booster Vaccination 01/04/2020, 06/30/2020   Moderna Sars-Covid-2 Vaccination 04/01/2019, 04/29/2019   Pneumococcal Conjugate-13 02/24/2018   Pneumococcal Polysaccharide-23 10/14/2018   Tdap 08/24/2017   Zoster, Live 12/06/2013    TDAP status: Up to date  Flu Vaccine status: Up to date  Pneumococcal vaccine status: Up to date  Covid-19 vaccine status: Completed vaccines  Qualifies for Shingles Vaccine? Yes   Zostavax completed Yes   Shingrix Completed?: No.    Education has been provided regarding the importance of this vaccine. Patient has been advised to call insurance company to determine out of pocket expense if they have not yet received this vaccine. Advised may also receive vaccine at local pharmacy or Health Dept. Verbalized acceptance and understanding.  Screening Tests Health Maintenance  Topic Date Due   Zoster Vaccines- Shingrix (1 of 2) Never done   COVID-19 Vaccine (3 - Moderna series) 08/25/2020   HEMOGLOBIN A1C  05/09/2022   FOOT EXAM  05/14/2022   OPHTHALMOLOGY EXAM  10/11/2022   Diabetic kidney evaluation - GFR measurement  11/09/2022   Diabetic kidney evaluation - Urine ACR  11/09/2022   COLONOSCOPY (Pts 45-4449yrs Insurance coverage will need to be confirmed)  07/29/2027   TETANUS/TDAP  08/25/2027   Pneumonia Vaccine 5965+ Years old  Completed   INFLUENZA VACCINE  Completed   Hepatitis C Screening  Completed   HPV VACCINES  Aged Out    Health Maintenance  Health Maintenance Due  Topic Date Due   Zoster Vaccines- Shingrix (1 of 2) Never done   COVID-19 Vaccine (3 - Moderna series) 08/25/2020    Colorectal cancer screening: Type of screening: Colonoscopy. Completed 07/28/2017. Repeat every 10 years  Lung Cancer Screening: (Low Dose CT Chest recommended if Age 19-80 years, 30 pack-year currently smoking OR have quit w/in 15years.) does not qualify.   Lung Cancer  Screening Referral: n/a  Additional Screening:  Hepatitis C Screening: does not qualify;   Vision Screening: Recommended annual ophthalmology exams for early detection of glaucoma and other disorders of the eye. Is the patient up to date with their annual eye exam?  Yes  Who is the provider or what is the name of the office in which the patient attends annual eye exams? Dr.Swabb  If pt is not established with a provider, would they like to be referred to a provider to establish care? No .   Dental Screening: Recommended annual dental exams for proper oral hygiene  Community Resource Referral / Chronic Care Management: CRR required this visit?  No   CCM required this visit?  No  Plan:     I have personally reviewed and noted the following in the patient's chart:   Medical and social history Use of alcohol, tobacco or illicit drugs  Current medications and supplements including opioid prescriptions. Patient is not currently taking opioid prescriptions. Functional ability and status Nutritional status Physical activity Advanced directives List of other physicians Hospitalizations, surgeries, and ER visits in previous 12 months Vitals Screenings to include cognitive, depression, and falls Referrals and appointments  In addition, I have reviewed and discussed with patient certain preventive protocols, quality metrics, and best practice recommendations. A written personalized care plan for preventive services as well as general preventive health recommendations were provided to patient.     Daphane Shepherd, LPN   [id number]   Nurse Notes: none

## 2021-11-27 NOTE — Patient Instructions (Signed)
Marvin [name] , Thank you for taking time to come for your Medicare Wellness Visit. I appreciate your ongoing commitment to your health goals. Please review the following plan we discussed and let me know if I can assist you in the future.   These are the goals we discussed:    This is a list of the screening recommended for you and due dates:  Health Maintenance  Topic Date Due   Zoster (Shingles) Vaccine (1 of 2) Never done   COVID-19 Vaccine (3 - Moderna series) 08/25/2020   Hemoglobin A1C  05/09/2022   Complete foot exam   05/14/2022   Eye exam for diabetics  10/11/2022   Yearly kidney function blood test for diabetes  11/09/2022   Yearly kidney health urinalysis for diabetes  11/09/2022   Colon Cancer Screening  07/29/2027   Tetanus Vaccine  08/25/2027   Pneumonia Vaccine  Completed   Flu Shot  Completed   Hepatitis C Screening: USPSTF Recommendation to screen - Ages 18-79 yo.  Completed   HPV Vaccine  Aged Out    Advanced directives: Advance directive discussed with you today. I have provided a copy for you to complete at home and have notarized. Once this is complete please bring a copy in to our office so we can scan it into your chart.   Conditions/risks identified: Aim for 30 minutes of exercise or brisk walking, 6-8 glasses of water, and 5 servings of fruits and vegetables each day.   Next appointment: Follow up in one year for your annual wellness visit.   Preventive Care 21 Years and Older, Male  Preventive care refers to lifestyle choices and visits with your health care provider that can promote health and wellness. What does preventive care include? A yearly physical exam. This is also called an annual well check. Dental exams once or twice a year. Routine eye exams. Ask your health care provider how often you should have your eyes checked. Personal lifestyle choices, including: Daily care of your teeth and gums. Regular physical activity. Eating a healthy  diet. Avoiding tobacco and drug use. Limiting alcohol use. Practicing safe sex. Taking low doses of aspirin every day. Taking vitamin and mineral supplements as recommended by your health care provider. What happens during an annual well check? The services and screenings done by your health care provider during your annual well check will depend on your age, overall health, lifestyle risk factors, and family history of disease. Counseling  Your health care provider may ask you questions about your: Alcohol use. Tobacco use. Drug use. Emotional well-being. Home and relationship well-being. Sexual activity. Eating habits. History of falls. Memory and ability to understand (cognition). Work and work Astronomer. Screening  You may have the following tests or measurements: Height, weight, and BMI. Blood pressure. Lipid and cholesterol levels. These may be checked every 5 years, or more frequently if you are over 83 years old. Skin check. Lung cancer screening. You may have this screening every year starting at age 11 if you have a 30-pack-year history of smoking and currently smoke or have quit within the past 15 years. Fecal occult blood test (FOBT) of the stool. You may have this test every year starting at age 50. Flexible sigmoidoscopy or colonoscopy. You may have a sigmoidoscopy every 5 years or a colonoscopy every 10 years starting at age 25. Prostate cancer screening. Recommendations will vary depending on your family history and other risks. Hepatitis C blood test. Hepatitis B blood test. Sexually  transmitted disease (STD) testing. Diabetes screening. This is done by checking your blood sugar (glucose) after you have not eaten for a while (fasting). You may have this done every 1-3 years. Abdominal aortic aneurysm (AAA) screening. You may need this if you are a current or former smoker. Osteoporosis. You may be screened starting at age 72 if you are at high risk. Talk with  your health care provider about your test results, treatment options, and if necessary, the need for more tests. Vaccines  Your health care provider may recommend certain vaccines, such as: Influenza vaccine. This is recommended every year. Tetanus, diphtheria, and acellular pertussis (Tdap, Td) vaccine. You may need a Td booster every 10 years. Zoster vaccine. You may need this after age 16. Pneumococcal 13-valent conjugate (PCV13) vaccine. One dose is recommended after age 11. Pneumococcal polysaccharide (PPSV23) vaccine. One dose is recommended after age 69. Talk to your health care provider about which screenings and vaccines you need and how often you need them. This information is not intended to replace advice given to you by your health care provider. Make sure you discuss any questions you have with your health care provider. Document Released: 03/02/2015 Document Revised: 10/24/2015 Document Reviewed: 12/05/2014 Elsevier Interactive Patient Education  2017 [employer] Prevention in the Home Falls can cause injuries. They can happen to people of all ages. There are many things you can do to make your home safe and to help prevent falls. What can I do on the outside of my home? Regularly fix the edges of walkways and driveways and fix any cracks. Remove anything that might make you trip as you walk through a door, such as a raised step or threshold. Trim any bushes or trees on the path to your home. Use bright outdoor lighting. Clear any walking paths of anything that might make someone trip, such as rocks or tools. Regularly check to see if handrails are loose or broken. Make sure that both sides of any steps have handrails. Any raised decks and porches should have guardrails on the edges. Have any leaves, snow, or ice cleared regularly. Use sand or salt on walking paths during winter. Clean up any spills in your garage right away. This includes oil or grease spills. What  can I do in the bathroom? Use night lights. Install grab bars by the toilet and in the tub and shower. Do not use towel bars as grab bars. Use non-skid mats or decals in the tub or shower. If you need to sit down in the shower, use a plastic, non-slip stool. Keep the floor dry. Clean up any water that spills on the floor as soon as it happens. Remove soap buildup in the tub or shower regularly. Attach bath mats securely with double-sided non-slip rug tape. Do not have throw rugs and other things on the floor that can make you trip. What can I do in the bedroom? Use night lights. Make sure that you have a light by your bed that is easy to reach. Do not use any sheets or blankets that are too big for your bed. They should not hang down onto the floor. Have a firm chair that has side arms. You can use this for support while you get dressed. Do not have throw rugs and other things on the floor that can make you trip. What can I do in the kitchen? Clean up any spills right away. Avoid walking on wet floors. Keep items that you use  a lot in easy-to-reach places. If you need to reach something above you, use a strong step stool that has a grab bar. Keep electrical cords out of the way. Do not use floor polish or wax that makes floors slippery. If you must use wax, use non-skid floor wax. Do not have throw rugs and other things on the floor that can make you trip. What can I do with my stairs? Do not leave any items on the stairs. Make sure that there are handrails on both sides of the stairs and use them. Fix handrails that are broken or loose. Make sure that handrails are as long as the stairways. Check any carpeting to make sure that it is firmly attached to the stairs. Fix any carpet that is loose or worn. Avoid having throw rugs at the top or bottom of the stairs. If you do have throw rugs, attach them to the floor with carpet tape. Make sure that you have a light switch at the top of the  stairs and the bottom of the stairs. If you do not have them, ask someone to add them for you. What else can I do to help prevent falls? Wear shoes that: Do not have high heels. Have rubber bottoms. Are comfortable and fit you well. Are closed at the toe. Do not wear sandals. If you use a stepladder: Make sure that it is fully opened. Do not climb a closed stepladder. Make sure that both sides of the stepladder are locked into place. Ask someone to hold it for you, if possible. Clearly mark and make sure that you can see: Any grab bars or handrails. First and last steps. Where the edge of each step is. Use tools that help you move around (mobility aids) if they are needed. These include: Canes. Walkers. Scooters. Crutches. Turn on the lights when you go into a dark area. Replace any light bulbs as soon as they burn out. Set up your furniture so you have a clear path. Avoid moving your furniture around. If any of your floors are uneven, fix them. If there are any pets around you, be aware of where they are. Review your medicines with your doctor. Some medicines can make you feel dizzy. This can increase your chance of falling. Ask your doctor what other things that you can do to help prevent falls. This information is not intended to replace advice given to you by your health care provider. Make sure you discuss any questions you have with your health care provider. Document Released: 11/30/2008 Document Revised: 07/12/2015 Document Reviewed: 03/10/2014 Elsevier Interactive Patient Education  2017 [employer].

## 2021-12-02 ENCOUNTER — Encounter: Payer: Self-pay | Admitting: Bariatrics

## 2021-12-02 NOTE — Progress Notes (Signed)
Chief Complaint:   OBESITY Marvin [name] is here to discuss his progress with his obesity treatment plan along with follow-up of his obesity related diagnoses. Marvin [name] is on the Category 2 Plan and states he is following his eating plan approximately 70% of the time. Marvin [name] states he is doing 0 minutes 0 times per week.  Today's visit was #: 21 Starting weight: 242 lbs Starting date: 07/04/2020 Today's weight: 236 lbs Today's date: 11/26/2021 Total lbs lost to date: 6 Total lbs lost since last in-office visit: 0  Interim History: Marvin [name] is up 2 lbs since his last visit.   Subjective:   1. Diabetes mellitus type 2 in obese [facility]) Marvin [name] is taking Ozempic and he notes his appetite is good.   2. Vitamin D deficiency Marvin [name] is taking Vitamin D, and he notes some sun exposure.   Assessment/Plan:   1. Diabetes mellitus type 2 in obese [facility]) Miken will continue Ozempic 1 mg once weekly, and we will refill for 1 month. He is to stay well hydrated.   - Semaglutide, 1 MG/DOSE, 4 MG/3ML SOPN; Inject 1 mg as directed once a week.  Dispense: 3 mL; Refill: 0  2. Vitamin D deficiency We will refill prescription Vitamin D for 90 days. Marvin [name] will follow-up for routine testing of Vitamin D, at least 2-3 times per year to avoid over-replacement.  - Vitamin D, Ergocalciferol, (DRISDOL) 1.25 MG (50000 UNIT) CAPS capsule; Take 1 capsule (50,000 Units total) by mouth every 7 (seven) days.  Dispense: 12 capsule; Refill: 0  3. Obesity, Current BMI 33.9 Marvin [name] is currently in the action stage of change. As such, his goal is to continue with weight loss efforts. He has agreed to the Category 2 Plan and keeping a food journal and adhering to recommended goals of 1200 calories and 80 grams of protein.   He will adhere to the plan 80-90%. Increase protein.  Exercise goals: walking.   Behavioral modification strategies: increasing lean protein intake, decreasing simple carbohydrates, increasing  vegetables, increasing water intake, decreasing eating out, no skipping meals, meal planning and cooking strategies, keeping healthy foods in the home, and planning for success.  Marvin [name] has agreed to follow-up with our clinic in 4 weeks. He was informed of the importance of frequent follow-up visits to maximize his success with intensive lifestyle modifications for his multiple health conditions.   Objective:   Pulse 66, temperature (!) 97.4 F (36.3 C), height 5\' 10"  (1.778 m), weight 236 lb (107 kg), SpO2 100 %. Body mass index is 33.86 kg/m.  General: Cooperative, alert, well developed, in no acute distress. HEENT: Conjunctivae and lids unremarkable. Cardiovascular: Regular rhythm.  Lungs: Normal work of breathing. Neurologic: No focal deficits.   Lab Results  Component Value Date   CREATININE 1.02 11/08/2021   BUN 17 11/08/2021   NA 140 11/08/2021   K 4.0 11/08/2021   CL 106 11/08/2021   CO2 26 11/08/2021   Lab Results  Component Value Date   ALT 15 09/18/2021   AST 33 09/18/2021   ALKPHOS 63 09/18/2021   BILITOT 0.8 09/18/2021   Lab Results  Component Value Date   HGBA1C 5.7 (H) 11/08/2021   HGBA1C 5.8 (H) 03/11/2021   HGBA1C 5.9 (H) 10/23/2020   HGBA1C 6.4 05/11/2020   HGBA1C 6.3 (H) 09/30/2019   Lab Results  Component Value Date   INSULIN 11.7 10/23/2020   INSULIN 10.6 07/04/2020   Lab Results  Component Value Date   TSH 2.170 07/04/2020  Lab Results  Component Value Date   CHOL 155 11/08/2021   HDL 41.40 11/08/2021   LDLCALC 99 11/08/2021   TRIG 74.0 11/08/2021   CHOLHDL 4 11/08/2021   Lab Results  Component Value Date   VD25OH 42.4 10/23/2020   VD25OH 7.3 (L) 07/04/2020   Lab Results  Component Value Date   WBC 9.0 09/18/2021   HGB 13.8 09/18/2021   HCT 41.1 09/18/2021   MCV 82.7 09/18/2021   PLT 190 09/18/2021   No results found for: "IRON", "TIBC", "FERRITIN"  Attestation Statements:   Reviewed by clinician on day of visit:  allergies, medications, problem list, medical history, surgical history, family history, social history, and previous encounter notes.   Wilhemena Durie, am acting as Location manager for [employer], DO.  I have reviewed the above documentation for accuracy and completeness, and I agree with the above. Jearld Lesch, DO

## 2021-12-23 ENCOUNTER — Encounter: Payer: Self-pay | Admitting: Orthopaedic Surgery

## 2021-12-24 ENCOUNTER — Ambulatory Visit (INDEPENDENT_AMBULATORY_CARE_PROVIDER_SITE_OTHER): Payer: 59 | Admitting: Bariatrics

## 2021-12-24 ENCOUNTER — Encounter: Payer: Self-pay | Admitting: Bariatrics

## 2021-12-24 VITALS — BP 129/88 | HR 65 | Temp 97.8°F | Ht 71.0 in | Wt 235.0 lb

## 2021-12-24 DIAGNOSIS — E669 Obesity, unspecified: Secondary | ICD-10-CM | POA: Diagnosis not present

## 2021-12-24 DIAGNOSIS — Z7985 Long-term (current) use of injectable non-insulin antidiabetic drugs: Secondary | ICD-10-CM

## 2021-12-24 DIAGNOSIS — E1169 Type 2 diabetes mellitus with other specified complication: Secondary | ICD-10-CM

## 2021-12-24 DIAGNOSIS — E559 Vitamin D deficiency, unspecified: Secondary | ICD-10-CM | POA: Diagnosis not present

## 2021-12-24 DIAGNOSIS — Z6833 Body mass index (BMI) 33.0-33.9, adult: Secondary | ICD-10-CM

## 2021-12-24 MED ORDER — VITAMIN D (ERGOCALCIFEROL) 1.25 MG (50000 UNIT) PO CAPS: 50000.0000 [IU] | ORAL_CAPSULE | ORAL | 0 refills | Status: DC

## 2022-01-02 ENCOUNTER — Telehealth: Payer: Self-pay | Admitting: Orthopaedic Surgery

## 2022-01-02 NOTE — Telephone Encounter (Signed)
Sure have him worked in.  Thanks .

## 2022-01-02 NOTE — Telephone Encounter (Signed)
Pt's wife Elnita Maxwell states husband right hip can't bare weight on surgery side. He has been in pain for the last few days. Please call Cheryl at [phone].

## 2022-01-03 ENCOUNTER — Ambulatory Visit (INDEPENDENT_AMBULATORY_CARE_PROVIDER_SITE_OTHER): Payer: 59 | Admitting: Orthopaedic Surgery

## 2022-01-03 ENCOUNTER — Ambulatory Visit (INDEPENDENT_AMBULATORY_CARE_PROVIDER_SITE_OTHER): Payer: 59

## 2022-01-03 DIAGNOSIS — Z96641 Presence of right artificial hip joint: Secondary | ICD-10-CM | POA: Diagnosis not present

## 2022-01-03 MED ORDER — PREDNISONE 10 MG (21) PO TBPK: ORAL_TABLET | ORAL | 3 refills | Status: DC

## 2022-01-03 NOTE — Telephone Encounter (Signed)
Tried to call. No answer. LMOM. He is already scheduled for next Wednesday, but was offering him a sooner appointment if he wanted. Ask him to call back if so.

## 2022-01-03 NOTE — Progress Notes (Signed)
Office Visit Note   Patient: Marvin [name]           Date of Birth: 1951/01/17           MRN: [id number] Visit Date: 01/03/2022              Requested by: Anne Ng, NP [address] [place],  [place] [address] PCP: Anne Ng, NP   Assessment & Plan: Visit Diagnoses:  1. History of total hip replacement, right   2. Status post total hip replacement, right     Plan: Impression is right hip pain.  Probably along the lines of tendinitis or overuse.  I do not see any evidence of infection.  I will place him on a 6-day course of prednisone and activity modifications.  Follow-up if symptoms do not improve.  Follow-Up Instructions: No follow-ups on file.   Orders:  Orders Placed This Encounter  Procedures   XR HIP UNILAT W OR W/O PELVIS 2-3 VIEWS RIGHT   Meds ordered this encounter  Medications   predniSONE (STERAPRED UNI-PAK 21 TAB) 10 MG (21) TBPK tablet    Sig: Take as directed    Dispense:  21 tablet    Refill:  3      Procedures: No procedures performed   Clinical Data: No additional findings.   Subjective: Chief Complaint  Patient presents with   Right Hip - Pain    HPI [name] is a 71 year old gentleman who underwent right total hip replacement in August 2021.  He has done well from this.  This past Monday he started experiencing sharp pains in his right hip and upper thigh region.  He had to use a walker temporarily.  Denies any injuries.  Denies any changes in activity.  He has taken some Advil which has helped.  Today he is here for evaluation and states that he is much better.  He has been limping due to the pain.  Review of Systems   Objective: Vital Signs: There were no vitals taken for this visit.  Physical Exam  Ortho Exam Examination of right hip shows painless fluid range of motion.  He has good hip flexion strength.  He has no tenderness palpation.  The surgical scar is fully healed.  Mild trochanteric  tenderness.  He has no groin pain. Specialty Comments:  No specialty comments available.  Imaging: XR HIP UNILAT W OR W/O PELVIS 2-3 VIEWS RIGHT  Result Date: 01/03/2022 2 view x-rays of the right hip shows a stable right total hip replacement in good position without any complications.    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   S/P hernia repair 03/15/2020   Primary osteoarthritis of right hip 10/03/2019   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   Chronic bilateral low back pain without sciatica 02/21/2019   Type 2 diabetes mellitus without complication, without long-term current use of insulin (HCC) 08/25/2017   Hyperuricemia 08/25/2017   Acid reflux 02/23/2017   HTN (hypertension) 09/11/2015   Hyperlipidemia associated with type 2 diabetes mellitus (HCC) 09/11/2015   Past Medical History:  Diagnosis Date   Alcohol abuse  Allergy    Arthritis    Back pain    Constipation 03/15/2020   Diabetes mellitus type 2 in obese (HCC) 09/23/2021   Drug abuse (HCC)    GERD (gastroesophageal reflux disease)    Gout    Gout 05/20/2016   Hernia    Hypercholesteremia    Hypertension    Joint pain    Obesity    Osteoarthritis    Pre-diabetes    Prediabetes    Stomach ulcer    Swallowing difficulty    Wears dentures    bottom front    Family History  Problem Relation Age of Onset   Healthy Mother    Healthy Father    Cancer Brother        bone cancer   Cancer Maternal Aunt        breast cancer   Cancer Maternal Uncle        lukemia   Cancer Cousin        unknown   Cancer Maternal Uncle        lukemia   Cancer Paternal Uncle         prostate   Hearing loss Maternal Grandfather 55    Past Surgical History:  Procedure Laterality Date   HERNIA REPAIR     twice   left shoulder rotator cuff repair      REPLACEMENT TOTAL KNEE     partial   SHOULDER SURGERY     right   TOTAL HIP ARTHROPLASTY Right 10/03/2019   Procedure: RIGHT TOTAL HIP ARTHROPLASTY ANTERIOR APPROACH;  Surgeon: Tarry Kos, MD;  Location: [facility];  Service: Orthopedics;  Laterality: Right;   Social History   Occupational History   Not on file  Tobacco Use   Smoking status: Former    Types: Cigarettes    Quit date: 03/20/1998    Years since quitting: 23.8   Smokeless tobacco: Former    Types: Associate Professor Use: Never used  Substance and Sexual Activity   Alcohol use: Not Currently    Comment: hx of 23 years ago   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

## 2022-01-06 ENCOUNTER — Encounter: Payer: Self-pay | Admitting: Bariatrics

## 2022-01-06 NOTE — Progress Notes (Signed)
Chief Complaint:   OBESITY Marvin [name] is here to discuss his progress with his obesity treatment plan along with follow-up of his obesity related diagnoses. Marvin [name] is on the Category 2 Plan and keeping a food journal and adhering to recommended goals of 1200 calories and 80 grams of protein daily and states he is following his eating plan approximately 70% of the time. Marvin [name] states he is walking 1,200 steps 4 times per week.   Today's visit was #: 22 Starting weight: 242 lbs Starting date: 07/04/2020 Today's weight: 235 lbs Today's date: 12/24/2021 Total lbs lost to date: 7 Total lbs lost since last in-office visit: 1  Interim History: Marvin [name] is down 1 additional pound from his last visit.   Subjective:   1. Diabetes mellitus type 2 in obese [facility]) Marvin [name] is taking Ozempic 1 mg with no side effects.   2. Vitamin D deficiency Marvin [name] is taking Vitamin D.   Assessment/Plan:   1. Diabetes mellitus type 2 in obese [facility]) Marvin [name] will continue Ozempic at 1 mg once weekly, he has a prescription, and we will increase at his next visit in 1 month.   2. Vitamin D deficiency Marvin [name] will continue prescription Vitamin D 50,000 IU every week, and we will refill for 90 days.   - Vitamin D, Ergocalciferol, (DRISDOL) 1.25 MG (50000 UNIT) CAPS capsule; Take 1 capsule (50,000 Units total) by mouth every 7 (seven) days.  Dispense: 12 capsule; Refill: 0  3. Obesity, Current BMI 33.7 Marvin [name] is currently in the action stage of change. As such, his goal is to continue with weight loss efforts. He has agreed to the Category 2 Plan and keeping a food journal and adhering to recommended goals of 1200 calories and 80 grams of protein daily.   Meal planning and intentional eating were discussed.   Exercise goals: As is.   Behavioral modification strategies: increasing lean protein intake, decreasing simple carbohydrates, increasing vegetables, increasing water intake, decreasing eating out, no  skipping meals, meal planning and cooking strategies, and keeping healthy foods in the home.  Marvin [name] has agreed to follow-up with our clinic in 4 weeks. He was informed of the importance of frequent follow-up visits to maximize his success with intensive lifestyle modifications for his multiple health conditions.   Objective:   Blood pressure 129/88, pulse 65, temperature 97.8 F (36.6 C), height 5\' 11"  (1.803 m), weight 235 lb (106.6 kg), SpO2 99 %. Body mass index is 32.78 kg/m.  General: Cooperative, alert, well developed, in no acute distress. HEENT: Conjunctivae and lids unremarkable. Cardiovascular: Regular rhythm.  Lungs: Normal work of breathing. Neurologic: No focal deficits.   Lab Results  Component Value Date   CREATININE 1.02 11/08/2021   BUN 17 11/08/2021   NA 140 11/08/2021   K 4.0 11/08/2021   CL 106 11/08/2021   CO2 26 11/08/2021   Lab Results  Component Value Date   ALT 15 09/18/2021   AST 33 09/18/2021   ALKPHOS 63 09/18/2021   BILITOT 0.8 09/18/2021   Lab Results  Component Value Date   HGBA1C 5.7 (H) 11/08/2021   HGBA1C 5.8 (H) 03/11/2021   HGBA1C 5.9 (H) 10/23/2020   HGBA1C 6.4 05/11/2020   HGBA1C 6.3 (H) 09/30/2019   Lab Results  Component Value Date   INSULIN 11.7 10/23/2020   INSULIN 10.6 07/04/2020   Lab Results  Component Value Date   TSH 2.170 07/04/2020   Lab Results  Component Value Date   CHOL 155 11/08/2021  HDL 41.40 11/08/2021   LDLCALC 99 11/08/2021   TRIG 74.0 11/08/2021   CHOLHDL 4 11/08/2021   Lab Results  Component Value Date   VD25OH 42.4 10/23/2020   VD25OH 7.3 (L) 07/04/2020   Lab Results  Component Value Date   WBC 9.0 09/18/2021   HGB 13.8 09/18/2021   HCT 41.1 09/18/2021   MCV 82.7 09/18/2021   PLT 190 09/18/2021   No results found for: "IRON", "TIBC", "FERRITIN"  Attestation Statements:   Reviewed by clinician on day of visit: allergies, medications, problem list, medical history, surgical  history, family history, social history, and previous encounter notes.   Trude Mcburney, am acting as Energy manager for [employer], DO.  I have reviewed the above documentation for accuracy and completeness, and I agree with the above. Corinna Capra, DO

## 2022-01-08 ENCOUNTER — Ambulatory Visit: Payer: 59 | Admitting: Orthopaedic Surgery

## 2022-01-17 ENCOUNTER — Other Ambulatory Visit: Payer: Self-pay | Admitting: Bariatrics

## 2022-01-17 DIAGNOSIS — E1169 Type 2 diabetes mellitus with other specified complication: Secondary | ICD-10-CM

## 2022-01-21 ENCOUNTER — Ambulatory Visit (INDEPENDENT_AMBULATORY_CARE_PROVIDER_SITE_OTHER): Payer: 59 | Admitting: Bariatrics

## 2022-01-21 ENCOUNTER — Encounter: Payer: Self-pay | Admitting: Bariatrics

## 2022-01-21 VITALS — BP 131/82 | HR 66 | Temp 97.8°F | Ht 71.0 in | Wt 239.0 lb

## 2022-01-21 DIAGNOSIS — Z6833 Body mass index (BMI) 33.0-33.9, adult: Secondary | ICD-10-CM

## 2022-01-21 DIAGNOSIS — E1169 Type 2 diabetes mellitus with other specified complication: Secondary | ICD-10-CM

## 2022-01-21 DIAGNOSIS — R632 Polyphagia: Secondary | ICD-10-CM | POA: Insufficient documentation

## 2022-01-21 DIAGNOSIS — Z7985 Long-term (current) use of injectable non-insulin antidiabetic drugs: Secondary | ICD-10-CM

## 2022-01-21 DIAGNOSIS — E559 Vitamin D deficiency, unspecified: Secondary | ICD-10-CM | POA: Diagnosis not present

## 2022-01-21 DIAGNOSIS — E669 Obesity, unspecified: Secondary | ICD-10-CM

## 2022-01-21 MED ORDER — VITAMIN D (ERGOCALCIFEROL) 1.25 MG (50000 UNIT) PO CAPS: 50000.0000 [IU] | ORAL_CAPSULE | ORAL | 0 refills | Status: DC

## 2022-01-29 ENCOUNTER — Telehealth (INDEPENDENT_AMBULATORY_CARE_PROVIDER_SITE_OTHER): Payer: Self-pay | Admitting: Bariatrics

## 2022-01-29 NOTE — Telephone Encounter (Signed)
Pt needs the new dosage of Ozempic sent to his pharmacy. Pt states that Dr. Manson Passey was going to increase his dosage. Please call today pt at [phone].        [facility].

## 2022-01-30 ENCOUNTER — Other Ambulatory Visit (INDEPENDENT_AMBULATORY_CARE_PROVIDER_SITE_OTHER): Payer: Self-pay | Admitting: Bariatrics

## 2022-01-30 MED ORDER — SEMAGLUTIDE (2 MG/DOSE) 8 MG/3ML ~~LOC~~ SOPN: 2.0000 mg | PEN_INJECTOR | SUBCUTANEOUS | 0 refills | Status: DC

## 2022-02-05 NOTE — Progress Notes (Signed)
Chief Complaint:   OBESITY Marvin [name] is here to discuss his progress with his obesity treatment plan along with follow-up of his obesity related diagnoses. Marvin [name] is on the Category 2 Plan and keeping a food journal and adhering to recommended goals of 1200 calories and 80 grams of protein and states he is following his eating plan approximately 70% of the time. Marvin [name] states he is walking 10,000 steps daily.    Today's visit was #: 23 Starting weight: 242 lbs Starting date: 07/04/2020 Today's weight: 239 lbs Today's date: 01/21/2022 Total lbs lost to date: 3 Total lbs lost since last in-office visit: 0  Interim History: Marvin [name] is up 4 pounds since his last visit before the holidays, but he is doing well overall.  He had his cataract surgery and he is doing well.  Subjective:   1. Vitamin D deficiency Marvin [name] is taking prescription vitamin D.  He notes some sun exposure.  2. Diabetes mellitus type 2 in obese Marvin [name] stopped the Ozempic before his surgery and then restarted.  3. Polyphagia Marvin [name] has been taking Ozempic at 1 mg once weekly.  Assessment/Plan:   1. Vitamin D deficiency He is taking vitamin D as directed.  - Vitamin D, Ergocalciferol, (DRISDOL) 1.25 MG (50000 UNIT) CAPS capsule; Take 1 capsule (50,000 Units total) by mouth every 7 (seven) days.  Dispense: 12 capsule; Refill: 0  2. Diabetes mellitus type 2 in obese Marvin [name]) Marvin [name] will continue Ozempic at 2 mg once weekly.  3. Polyphagia Marvin [name] agreed to increase Ozempic from 1 mg to 2 mg once weekly, and we will refill for 1 month.  4. Obesity, Current BMI 33.4 Marvin [name] is currently in the action stage of change. As such, his goal is to continue with weight loss efforts. He has agreed to the Category 2 Plan and keeping a food journal and adhering to recommended goals of 1200 calories and 80 grams of protein.   Exercise goals: As is.   Behavioral modification strategies: increasing lean protein  intake, decreasing simple carbohydrates, increasing vegetables, increasing water intake, decreasing eating out, no skipping meals, meal planning and cooking strategies, keeping healthy foods in the home, and planning for success.  Marvin [name] has agreed to follow-up with our clinic in 4 weeks. He was informed of the importance of frequent follow-up visits to maximize his success with intensive lifestyle modifications for his multiple health conditions.   Objective:   Blood pressure 131/82, pulse 66, temperature 97.8 F (36.6 C), height 5\' 11"  (1.803 m), weight 239 lb (108.4 kg), SpO2 98 %. Body mass index is 33.33 kg/m.  General: Cooperative, alert, well developed, in no acute distress. HEENT: Conjunctivae and lids unremarkable. Cardiovascular: Regular rhythm.  Lungs: Normal work of breathing. Neurologic: No focal deficits.   Lab Results  Component Value Date   CREATININE 1.02 11/08/2021   BUN 17 11/08/2021   NA 140 11/08/2021   K 4.0 11/08/2021   CL 106 11/08/2021   CO2 26 11/08/2021   Lab Results  Component Value Date   ALT 15 09/18/2021   AST 33 09/18/2021   ALKPHOS 63 09/18/2021   BILITOT 0.8 09/18/2021   Lab Results  Component Value Date   HGBA1C 5.7 (H) 11/08/2021   HGBA1C 5.8 (H) 03/11/2021   HGBA1C 5.9 (H) 10/23/2020   HGBA1C 6.4 05/11/2020   HGBA1C 6.3 (H) 09/30/2019   Lab Results  Component Value Date   INSULIN 11.7 10/23/2020   INSULIN 10.6 07/04/2020   Lab Results  Component Value Date   TSH 2.170 07/04/2020   Lab Results  Component Value Date   CHOL 155 11/08/2021   HDL 41.40 11/08/2021   LDLCALC 99 11/08/2021   TRIG 74.0 11/08/2021   CHOLHDL 4 11/08/2021   Lab Results  Component Value Date   VD25OH 42.4 10/23/2020   VD25OH 7.3 (L) 07/04/2020   Lab Results  Component Value Date   WBC 9.0 09/18/2021   HGB 13.8 09/18/2021   HCT 41.1 09/18/2021   MCV 82.7 09/18/2021   PLT 190 09/18/2021   No results found for: "IRON", "TIBC",  "FERRITIN"  Attestation Statements:   Reviewed by clinician on day of visit: allergies, medications, problem list, medical history, surgical history, family history, social history, and previous encounter notes.   Trude Mcburney, am acting as Energy manager for [employer], DO.  I have reviewed the above documentation for accuracy and completeness, and I agree with the above. Corinna Capra, DO

## 2022-02-19 ENCOUNTER — Ambulatory Visit (INDEPENDENT_AMBULATORY_CARE_PROVIDER_SITE_OTHER): Payer: 59 | Admitting: Bariatrics

## 2022-02-19 ENCOUNTER — Encounter: Payer: Self-pay | Admitting: Bariatrics

## 2022-02-19 VITALS — BP 146/78 | HR 75 | Temp 97.7°F | Ht 71.0 in | Wt 239.0 lb

## 2022-02-19 DIAGNOSIS — E669 Obesity, unspecified: Secondary | ICD-10-CM | POA: Diagnosis not present

## 2022-02-19 DIAGNOSIS — E559 Vitamin D deficiency, unspecified: Secondary | ICD-10-CM | POA: Diagnosis not present

## 2022-02-19 DIAGNOSIS — Z6833 Body mass index (BMI) 33.0-33.9, adult: Secondary | ICD-10-CM | POA: Diagnosis not present

## 2022-02-19 DIAGNOSIS — E1169 Type 2 diabetes mellitus with other specified complication: Secondary | ICD-10-CM | POA: Diagnosis not present

## 2022-02-19 DIAGNOSIS — Z7985 Long-term (current) use of injectable non-insulin antidiabetic drugs: Secondary | ICD-10-CM

## 2022-02-19 MED ORDER — VITAMIN D (ERGOCALCIFEROL) 1.25 MG (50000 UNIT) PO CAPS: 50000.0000 [IU] | ORAL_CAPSULE | ORAL | 0 refills | Status: DC

## 2022-02-19 MED ORDER — SEMAGLUTIDE (2 MG/DOSE) 8 MG/3ML ~~LOC~~ SOPN: 2.0000 mg | PEN_INJECTOR | SUBCUTANEOUS | 0 refills | Status: DC

## 2022-02-20 LAB — HM DIABETES EYE EXAM

## 2022-02-24 ENCOUNTER — Other Ambulatory Visit (INDEPENDENT_AMBULATORY_CARE_PROVIDER_SITE_OTHER): Payer: Self-pay | Admitting: Bariatrics

## 2022-02-24 DIAGNOSIS — E1169 Type 2 diabetes mellitus with other specified complication: Secondary | ICD-10-CM

## 2022-02-26 DIAGNOSIS — B079 Viral wart, unspecified: Secondary | ICD-10-CM | POA: Diagnosis not present

## 2022-03-05 NOTE — Progress Notes (Signed)
Chief Complaint:   OBESITY Marvin [name] is here to discuss his progress with his obesity treatment plan along with follow-up of his obesity related diagnoses. Marvin [name] is on the Category 3 Plan and keeping a food journal and adhering to recommended goals of 1200 calories and 80 grams of protein and states he is following his eating plan approximately 70% of the time. Marvin [name] states he is walking 12,000 steps at work daily.    Today's visit was #: 24 Starting weight: 242 lbs Starting date: 07/04/2020 Today's weight: 239 lbs Today's date: 02/19/2022 Total lbs lost to date: 3 Total lbs lost since last in-office visit: 0  Interim History: [name]'s weight remains the same since his last visit.   Subjective:   1. Vitamin D deficiency Marvin [name] notes limited sun exposure.   2. Diabetes mellitus type 2 in obese [facility]) [name] is taking Semaglutide as directed.   Assessment/Plan:   1. Vitamin D deficiency Mose will continue prescription Vitamin D, and we will refill for 90 days.   - Vitamin D, Ergocalciferol, (DRISDOL) 1.25 MG (50000 UNIT) CAPS capsule; Take 1 capsule (50,000 Units total) by mouth every 7 (seven) days.  Dispense: 12 capsule; Refill: 0  2. Diabetes mellitus type 2 in obese [facility]) Darion will continue Semaglutide, and we will refill for 1 month.   - Semaglutide, 2 MG/DOSE, 8 MG/3ML SOPN; Inject 2 mg as directed once a week.  Dispense: 3 mL; Refill: 0  3. Obesity, Current BMI 33.4 Marvin [name] is currently in the action stage of change. As such, his goal is to continue with weight loss efforts. He has agreed to the Category 2 Plan and keeping a food journal and adhering to recommended goals of 1200 calories and 80 grams of protein.   Meal planning and intentional eating. Increase protein and water.   Exercise goals: As is.   Behavioral modification strategies: increasing lean protein intake, decreasing simple carbohydrates, increasing vegetables, increasing water intake,  decreasing eating out, no skipping meals, meal planning and cooking strategies, keeping healthy foods in the home, and keeping a strict food journal.  Marvin [name] has agreed to follow-up with our clinic in 4 weeks. He was informed of the importance of frequent follow-up visits to maximize his success with intensive lifestyle modifications for his multiple health conditions.   Objective:   Blood pressure (!) 146/78, pulse 75, temperature 97.7 F (36.5 C), height 5\' 11"  (1.803 m), weight 239 lb (108.4 kg), SpO2 97 %. Body mass index is 33.33 kg/m.  General: Cooperative, alert, well developed, in no acute distress. HEENT: Conjunctivae and lids unremarkable. Cardiovascular: Regular rhythm.  Lungs: Normal work of breathing. Neurologic: No focal deficits.   Lab Results  Component Value Date   CREATININE 1.02 11/08/2021   BUN 17 11/08/2021   NA 140 11/08/2021   K 4.0 11/08/2021   CL 106 11/08/2021   CO2 26 11/08/2021   Lab Results  Component Value Date   ALT 15 09/18/2021   AST 33 09/18/2021   ALKPHOS 63 09/18/2021   BILITOT 0.8 09/18/2021   Lab Results  Component Value Date   HGBA1C 5.7 (H) 11/08/2021   HGBA1C 5.8 (H) 03/11/2021   HGBA1C 5.9 (H) 10/23/2020   HGBA1C 6.4 05/11/2020   HGBA1C 6.3 (H) 09/30/2019   Lab Results  Component Value Date   INSULIN 11.7 10/23/2020   INSULIN 10.6 07/04/2020   Lab Results  Component Value Date   TSH 2.170 07/04/2020   Lab Results  Component Value Date  CHOL 155 11/08/2021   HDL 41.40 11/08/2021   LDLCALC 99 11/08/2021   TRIG 74.0 11/08/2021   CHOLHDL 4 11/08/2021   Lab Results  Component Value Date   VD25OH 42.4 10/23/2020   VD25OH 7.3 (L) 07/04/2020   Lab Results  Component Value Date   WBC 9.0 09/18/2021   HGB 13.8 09/18/2021   HCT 41.1 09/18/2021   MCV 82.7 09/18/2021   PLT 190 09/18/2021   No results found for: "IRON", "TIBC", "FERRITIN"  Attestation Statements:   Reviewed by clinician on day of visit:  allergies, medications, problem list, medical history, surgical history, family history, social history, and previous encounter notes.   Wilhemena Durie, am acting as Location manager for [employer], DO.  I have reviewed the above documentation for accuracy and completeness, and I agree with the above. Jearld Lesch, DO

## 2022-03-06 ENCOUNTER — Encounter: Payer: Self-pay | Admitting: Bariatrics

## 2022-03-12 DIAGNOSIS — B079 Viral wart, unspecified: Secondary | ICD-10-CM | POA: Diagnosis not present

## 2022-03-19 ENCOUNTER — Ambulatory Visit: Payer: 59 | Admitting: Bariatrics

## 2022-03-20 ENCOUNTER — Other Ambulatory Visit: Payer: Self-pay | Admitting: Bariatrics

## 2022-03-20 DIAGNOSIS — E1169 Type 2 diabetes mellitus with other specified complication: Secondary | ICD-10-CM

## 2022-03-26 ENCOUNTER — Ambulatory Visit (INDEPENDENT_AMBULATORY_CARE_PROVIDER_SITE_OTHER): Payer: 59 | Admitting: Bariatrics

## 2022-03-26 ENCOUNTER — Encounter: Payer: Self-pay | Admitting: Bariatrics

## 2022-03-26 VITALS — BP 122/75 | HR 64 | Temp 97.6°F | Ht 71.0 in | Wt 236.0 lb

## 2022-03-26 DIAGNOSIS — E669 Obesity, unspecified: Secondary | ICD-10-CM | POA: Diagnosis not present

## 2022-03-26 DIAGNOSIS — Z7985 Long-term (current) use of injectable non-insulin antidiabetic drugs: Secondary | ICD-10-CM

## 2022-03-26 DIAGNOSIS — E559 Vitamin D deficiency, unspecified: Secondary | ICD-10-CM

## 2022-03-26 DIAGNOSIS — E1169 Type 2 diabetes mellitus with other specified complication: Secondary | ICD-10-CM | POA: Diagnosis not present

## 2022-03-26 DIAGNOSIS — Z6832 Body mass index (BMI) 32.0-32.9, adult: Secondary | ICD-10-CM | POA: Diagnosis not present

## 2022-03-26 MED ORDER — SEMAGLUTIDE (2 MG/DOSE) 8 MG/3ML ~~LOC~~ SOPN: 2.0000 mg | PEN_INJECTOR | SUBCUTANEOUS | 0 refills | Status: DC

## 2022-04-09 NOTE — Progress Notes (Signed)
Chief Complaint:   OBESITY Marvin [name] is here to discuss his progress with his obesity treatment plan along with follow-up of his obesity related diagnoses. Marvin [name] is on the Category 2 plan and keeping a food journal with goal of 1200 calories and 80 grams of protein daily and states he is following his eating plan approximately 70% of the time. Marvin [name] states he is walking 10000-12000 steps daily.  Today's visit was #: 25 Starting weight: 242 lbs Starting date: 07/04/20 Today's weight: 236 lbs Today's date: 03/26/22 Total lbs lost to date: 6 Total lbs lost since last in-office visit: -3  Interim History: He is down an additional 3 pounds.  He has been focusing on his meals and exercise.  Subjective:   1. Vitamin D deficiency Some sun exposure. [place].  2. Diabetes mellitus type 2 in obese ([place]) Taking Ozempic.  No side effects.   Assessment/Plan:   1. Vitamin D deficiency 1.  Continue vitamin D. 2.  Will get some sun exposure periodically.  2. Diabetes mellitus type 2 in obese (HCC) Refill: - Semaglutide, 2 MG/DOSE, 8 MG/3ML SOPN; Inject 2 mg as directed once a week.  Dispense: 3 mL; Refill: 0  3. Generalized obesity, BMI 32.0-32.9,adult 1.  Will follow the plan closely. 2.  Mindful eating. 3.  Increase vegetables and protein.  Marvin [name] is currently in the action stage of change. As such, his goal is to continue with weight loss efforts. He has agreed to the Category 2 plan and keeping a food journal with goal of 1200 calories and 80 grams of protein daily.  Exercise goals: Walking and some exercise.  Behavioral modification strategies: increasing lean protein intake, decreasing simple carbohydrates, increasing vegetables, increasing water intake, decreasing eating out, no skipping meals, meal planning and cooking strategies, keeping healthy foods in the home, and planning for success.  Marvin [name] has agreed to follow-up with our clinic in 4 weeks. He was informed  of the importance of frequent follow-up visits to maximize his success with intensive lifestyle modifications for his multiple health conditions.   Objective:   Blood pressure 122/75, pulse 64, temperature 97.6 F (36.4 C), height '5\' 11"'$  (1.803 m), weight 236 lb (107 kg), SpO2 98 %. Body mass index is 32.92 kg/m.  General: Cooperative, alert, well developed, in no acute distress. HEENT: Conjunctivae and lids unremarkable. Cardiovascular: Regular rhythm.  Lungs: Normal work of breathing. Neurologic: No focal deficits.   Lab Results  Component Value Date   CREATININE 1.02 11/08/2021   BUN 17 11/08/2021   NA 140 11/08/2021   K 4.0 11/08/2021   CL 106 11/08/2021   CO2 26 11/08/2021   Lab Results  Component Value Date   ALT 15 09/18/2021   AST 33 09/18/2021   ALKPHOS 63 09/18/2021   BILITOT 0.8 09/18/2021   Lab Results  Component Value Date   HGBA1C 5.7 (H) 11/08/2021   HGBA1C 5.8 (H) 03/11/2021   HGBA1C 5.9 (H) 10/23/2020   HGBA1C 6.4 05/11/2020   HGBA1C 6.3 (H) 09/30/2019   Lab Results  Component Value Date   INSULIN 11.7 10/23/2020   INSULIN 10.6 07/04/2020   Lab Results  Component Value Date   TSH 2.170 07/04/2020   Lab Results  Component Value Date   CHOL 155 11/08/2021   HDL 41.40 11/08/2021   LDLCALC 99 11/08/2021   TRIG 74.0 11/08/2021   CHOLHDL 4 11/08/2021   Lab Results  Component Value Date   VD25OH 42.4 10/23/2020   VD25OH  7.3 (L) 07/04/2020   Lab Results  Component Value Date   WBC 9.0 09/18/2021   HGB 13.8 09/18/2021   HCT 41.1 09/18/2021   MCV 82.7 09/18/2021   PLT 190 09/18/2021   No results found for: "IRON", "TIBC", "FERRITIN"  Attestation Statements:   Reviewed by clinician on day of visit: allergies, medications, problem list, medical history, surgical history, family history, social history, and previous encounter notes.  I, Dawn Whitmire, FNP-C, am acting as transcriptionist for Dr. Jearld Lesch.  I have reviewed the above  documentation for accuracy and completeness, and I agree with the above. Jearld Lesch, DO

## 2022-04-15 ENCOUNTER — Encounter: Payer: Self-pay | Admitting: Bariatrics

## 2022-04-19 ENCOUNTER — Other Ambulatory Visit: Payer: Self-pay | Admitting: Bariatrics

## 2022-04-19 DIAGNOSIS — E1169 Type 2 diabetes mellitus with other specified complication: Secondary | ICD-10-CM

## 2022-04-23 ENCOUNTER — Encounter: Payer: Self-pay | Admitting: Bariatrics

## 2022-04-23 ENCOUNTER — Ambulatory Visit: Payer: 59 | Admitting: Bariatrics

## 2022-04-23 ENCOUNTER — Ambulatory Visit (INDEPENDENT_AMBULATORY_CARE_PROVIDER_SITE_OTHER): Payer: 59 | Admitting: Bariatrics

## 2022-04-23 VITALS — BP 114/76 | HR 61 | Temp 97.8°F | Ht 71.0 in | Wt 236.0 lb

## 2022-04-23 DIAGNOSIS — Z7985 Long-term (current) use of injectable non-insulin antidiabetic drugs: Secondary | ICD-10-CM

## 2022-04-23 DIAGNOSIS — E559 Vitamin D deficiency, unspecified: Secondary | ICD-10-CM

## 2022-04-23 DIAGNOSIS — Z6832 Body mass index (BMI) 32.0-32.9, adult: Secondary | ICD-10-CM

## 2022-04-23 DIAGNOSIS — E669 Obesity, unspecified: Secondary | ICD-10-CM | POA: Diagnosis not present

## 2022-04-23 DIAGNOSIS — E1169 Type 2 diabetes mellitus with other specified complication: Secondary | ICD-10-CM | POA: Diagnosis not present

## 2022-04-23 MED ORDER — SEMAGLUTIDE (2 MG/DOSE) 8 MG/3ML ~~LOC~~ SOPN: 2.0000 mg | PEN_INJECTOR | SUBCUTANEOUS | 0 refills | Status: DC

## 2022-04-23 MED ORDER — VITAMIN D (ERGOCALCIFEROL) 1.25 MG (50000 UNIT) PO CAPS: 50000.0000 [IU] | ORAL_CAPSULE | ORAL | 0 refills | Status: DC

## 2022-05-06 NOTE — Progress Notes (Signed)
Chief Complaint:   OBESITY Marvin [name] is here to discuss his progress with his obesity treatment plan along with follow-up of his obesity related diagnoses. Trillion is on the Category 2 plan and keeping a food journal with goal of 1200 calories and 80 grams of protein daily and states he is following his eating plan approximately 70% of the time. Jaxs states he is getting 1200-1400 steps 3 times per week.  Today's visit was #: 40 Starting weight: 242 lbs Starting date: 07/04/20 Today's weight: 236 lbs Today's date: 04/23/22 Total lbs lost to date: 6 Total lbs lost since last in-office visit: 0  Interim History: His weight is the same as previous.  Subjective:   1. Vitamin D deficiency Taking vitamin D.  2. Diabetes mellitus type 2 in obese [facility]) Taking as directed.  Assessment/Plan:   1. Vitamin D deficiency Refill- - Vitamin D, Ergocalciferol, (DRISDOL) 1.25 MG (50000 UNIT) CAPS capsule; Take 1 capsule (50,000 Units total) by mouth every 7 (seven) days.  Dispense: 12 capsule; Refill: 0  2. Diabetes mellitus type 2 in obese (HCC) Refill- - Semaglutide, 2 MG/DOSE, 8 MG/3ML SOPN; Inject 2 mg as directed once a week.  Dispense: 3 mL; Refill: 0  3. Generalized obesity BMI 32.0-32.9,adult 1.  Will adhere to the plan and exercise 80-90%.  Marvin [name] is currently in the action stage of change. As such, his goal is to continue with weight loss efforts. He has agreed to the Category 2 plan and keeping a food journal with goal of 1200 calories and 80 grams of protein daily.  Exercise goals: All adults should avoid inactivity. Some physical activity is better than none, and adults who participate in any amount of physical activity gain some health benefits.  Behavioral modification strategies: increasing lean protein intake, decreasing simple carbohydrates, increasing vegetables, increasing water intake, decreasing eating out, no skipping meals, meal planning and cooking strategies,  keeping healthy foods in the home, and planning for success.  Marvin [name] has agreed to follow-up with our clinic in 4 weeks. He was informed of the importance of frequent follow-up visits to maximize his success with intensive lifestyle modifications for his multiple health conditions.   Objective:   Blood pressure 114/76, pulse 61, temperature 97.8 F (36.6 C), height 5\' 11"  (1.803 m), weight 236 lb (107 kg), SpO2 98 %. Body mass index is 32.92 kg/m.  General: Cooperative, alert, well developed, in no acute distress. HEENT: Conjunctivae and lids unremarkable. Cardiovascular: Regular rhythm.  Lungs: Normal work of breathing. Neurologic: No focal deficits.   Lab Results  Component Value Date   CREATININE 1.02 11/08/2021   BUN 17 11/08/2021   NA 140 11/08/2021   K 4.0 11/08/2021   CL 106 11/08/2021   CO2 26 11/08/2021   Lab Results  Component Value Date   ALT 15 09/18/2021   AST 33 09/18/2021   ALKPHOS 63 09/18/2021   BILITOT 0.8 09/18/2021   Lab Results  Component Value Date   HGBA1C 5.7 (H) 11/08/2021   HGBA1C 5.8 (H) 03/11/2021   HGBA1C 5.9 (H) 10/23/2020   HGBA1C 6.4 05/11/2020   HGBA1C 6.3 (H) 09/30/2019   Lab Results  Component Value Date   INSULIN 11.7 10/23/2020   INSULIN 10.6 07/04/2020   Lab Results  Component Value Date   TSH 2.170 07/04/2020   Lab Results  Component Value Date   CHOL 155 11/08/2021   HDL 41.40 11/08/2021   LDLCALC 99 11/08/2021   TRIG 74.0 11/08/2021  CHOLHDL 4 11/08/2021   Lab Results  Component Value Date   VD25OH 42.4 10/23/2020   VD25OH 7.3 (L) 07/04/2020   Lab Results  Component Value Date   WBC 9.0 09/18/2021   HGB 13.8 09/18/2021   HCT 41.1 09/18/2021   MCV 82.7 09/18/2021   PLT 190 09/18/2021   No results found for: "IRON", "TIBC", "FERRITIN"  Attestation Statements:   Reviewed by clinician on day of visit: allergies, medications, problem list, medical history, surgical history, family history, social history,  and previous encounter notes.  I, Dawn Whitmire, FNP-C, am acting as transcriptionist for Dr. Jearld Lesch.  I have reviewed the above documentation for accuracy and completeness, and I agree with the above. Jearld Lesch, DO

## 2022-05-12 ENCOUNTER — Encounter: Payer: Self-pay | Admitting: Nurse Practitioner

## 2022-05-12 ENCOUNTER — Ambulatory Visit (INDEPENDENT_AMBULATORY_CARE_PROVIDER_SITE_OTHER): Payer: 59 | Admitting: Nurse Practitioner

## 2022-05-12 VITALS — BP 110/80 | HR 72 | Temp 98.9°F | Resp 16 | Ht 71.0 in | Wt 247.2 lb

## 2022-05-12 DIAGNOSIS — E1169 Type 2 diabetes mellitus with other specified complication: Secondary | ICD-10-CM | POA: Diagnosis not present

## 2022-05-12 DIAGNOSIS — I10 Essential (primary) hypertension: Secondary | ICD-10-CM

## 2022-05-12 DIAGNOSIS — E785 Hyperlipidemia, unspecified: Secondary | ICD-10-CM | POA: Diagnosis not present

## 2022-05-12 DIAGNOSIS — R748 Abnormal levels of other serum enzymes: Secondary | ICD-10-CM

## 2022-05-12 DIAGNOSIS — M545 Low back pain, unspecified: Secondary | ICD-10-CM | POA: Diagnosis not present

## 2022-05-12 DIAGNOSIS — E790 Hyperuricemia without signs of inflammatory arthritis and tophaceous disease: Secondary | ICD-10-CM

## 2022-05-12 DIAGNOSIS — G8929 Other chronic pain: Secondary | ICD-10-CM

## 2022-05-12 LAB — HEMOGLOBIN A1C: Hgb A1c MFr Bld: 6.1 % (ref 4.6–6.5)

## 2022-05-12 LAB — COMPREHENSIVE METABOLIC PANEL
ALT: 17 U/L (ref 0–53)
AST: 17 U/L (ref 0–37)
Albumin: 4.5 g/dL (ref 3.5–5.2)
Alkaline Phosphatase: 74 U/L (ref 39–117)
BUN: 13 mg/dL (ref 6–23)
CO2: 27 mEq/L (ref 19–32)
Calcium: 9.3 mg/dL (ref 8.4–10.5)
Chloride: 105 mEq/L (ref 96–112)
Creatinine, Ser: 1.16 mg/dL (ref 0.40–1.50)
GFR: 64.38 mL/min (ref >60.00)
Glucose, Bld: 95 mg/dL (ref 70–99)
Potassium: 4.1 mEq/L (ref 3.5–5.1)
Sodium: 139 mEq/L (ref 135–145)
Total Bilirubin: 0.6 mg/dL (ref 0.2–1.2)
Total Protein: 7.1 g/dL (ref 6.0–8.3)

## 2022-05-12 LAB — URIC ACID: Uric Acid, Serum: 7.1 mg/dL (ref 4.0–7.8)

## 2022-05-12 LAB — CK: Total CK: 127 U/L (ref 7–232)

## 2022-05-12 LAB — LDL CHOLESTEROL, DIRECT: Direct LDL: 109 mg/dL

## 2022-05-12 MED ORDER — CYCLOBENZAPRINE HCL 5 MG PO TABS: 5.0000 mg | ORAL_TABLET | Freq: Every day | ORAL | 0 refills | Status: DC | PRN

## 2022-05-12 MED ORDER — AMLODIPINE BESYLATE 10 MG PO TABS: 10.0000 mg | ORAL_TABLET | Freq: Every day | ORAL | 3 refills | Status: DC

## 2022-05-12 MED ORDER — ALLOPURINOL 100 MG PO TABS: 100.0000 mg | ORAL_TABLET | Freq: Every day | ORAL | 3 refills | Status: DC

## 2022-05-12 NOTE — Progress Notes (Signed)
Established Patient Visit  Patient: Marvin [name]   DOB: [birth year]   72 y.o. 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 Denies any muscle weakness or paresthesia or change in GU/GI function. Use of flexeril 5mg  orn at hs prn. Last filled 2022 Advised about med side effects. He verbalized understanding. Flexerill refill sent  Hyperuricemia No gout exacerbation Compliant with allopurinol and low purine diet Repeat uric acid and CMP  Hyperlipidemia associated with type 2 diabetes mellitus (HCC) Repeat CMP and hgbA1c No neuropathy No retinopathy or nephropathy Current use of ozempic without any adverse effects  Wt Readings from Last 3 Encounters:  05/12/22 247 lb 3.2 oz (112.1 kg)  04/23/22 236 lb (107 kg)  03/26/22 236 lb (107 kg)    Reviewed medical, surgical, and social history today  Medications: Outpatient Medications Prior to Visit  Medication Sig   aspirin EC 81 MG tablet Take 1 tablet (81 mg total) by mouth daily. Swallow whole.   Semaglutide, 2 MG/DOSE, 8 MG/3ML SOPN Inject 2 mg as directed once a week.   simvastatin (ZOCOR) 20 MG tablet Take 1 tablet (20 mg total) by mouth 2 (two) times a week.   Vitamin D, Ergocalciferol, (DRISDOL) 1.25 MG (50000 UNIT) CAPS capsule Take 1 capsule (50,000 Units total) by mouth every 7 (seven) days.   [DISCONTINUED] allopurinol (ZYLOPRIM) 100 MG tablet Take 1 tablet (100 mg total) by mouth daily.   [DISCONTINUED] amLODipine (NORVASC) 10 MG tablet Take 1 tablet (10 mg total) by mouth daily.   [DISCONTINUED] predniSONE (STERAPRED UNI-PAK 21 TAB) 10 MG (21) TBPK tablet Take as directed (Patient not taking:  Reported on 05/12/2022)   No facility-administered medications prior to visit.   Reviewed past medical and social history.   ROS per HPI above      Objective:  BP 110/80 (BP Location: Left Arm, Patient Position: Sitting, Cuff Size: Normal)   Pulse 72   Temp 98.9 F (37.2 C) (Temporal)   Resp 16   Ht 5\' 11"  (1.803 m)   Wt 247 lb 3.2 oz (112.1 kg)   SpO2 95%   BMI 34.48 kg/m      Physical Exam Cardiovascular:     Rate and Rhythm: Normal rate and regular rhythm.     Pulses: Normal pulses.     Heart sounds: Normal heart sounds.  Pulmonary:     Effort: Pulmonary effort is normal.     Breath sounds: Normal breath sounds.  Neurological:     Mental Status: He is alert and oriented to person, place, and time.     No results found for any visits on 05/12/22.    Assessment & Plan:    Problem List Items Addressed This Visit       Cardiovascular and Mediastinum   HTN (hypertension) - Primary   Relevant Medications   amLODipine (NORVASC) 10 MG tablet   Other Relevant Orders   Comprehensive metabolic panel     Endocrine   DM (diabetes mellitus) ([place])   Relevant Orders   Hemoglobin A1c   Comprehensive metabolic panel   Hyperlipidemia associated with  type 2 diabetes mellitus (HCC)    Repeat CMP and hgbA1c No neuropathy No retinopathy or nephropathy Current use of ozempic without any adverse effects      Relevant Medications   amLODipine (NORVASC) 10 MG tablet   Other Relevant Orders   Comprehensive metabolic panel   Direct LDL     Other   Chronic bilateral low back pain without sciatica    Associated with intermittent back muscle spasm, triggered by repeated lifting and moving packages at current job ([employer]). Denies any muscle weakness or paresthesia or change in GU/GI function. Use of flexeril 5mg  orn at hs prn. Last filled 2022 Advised about med side effects. He verbalized understanding. Flexerill refill sent      Relevant Medications   cyclobenzaprine  (FLEXERIL) 5 MG tablet   Hyperuricemia    No gout exacerbation Compliant with allopurinol and low purine diet Repeat uric acid and CMP      Relevant Medications   allopurinol (ZYLOPRIM) 100 MG tablet   Other Relevant Orders   Uric acid   Other Visit Diagnoses     Elevated CK       Relevant Orders   CK      Return in about 6 months (around 11/12/2022) for CPE (fasting).     Wilfred Lacy, NP

## 2022-05-12 NOTE — Assessment & Plan Note (Signed)
Repeat CMP and hgbA1c No neuropathy No retinopathy or nephropathy Current use of ozempic without any adverse effects

## 2022-05-12 NOTE — Assessment & Plan Note (Signed)
Associated with intermittent back muscle spasm, triggered by repeated lifting and moving packages at current job ([employer]). Denies any muscle weakness or paresthesia or change in GU/GI function. Use of flexeril 5mg  orn at hs prn. Last filled 2022 Advised about med side effects. He verbalized understanding. Flexerill refill sent

## 2022-05-12 NOTE — Assessment & Plan Note (Signed)
No gout exacerbation Compliant with allopurinol and low purine diet Repeat uric acid and CMP

## 2022-05-14 ENCOUNTER — Encounter: Payer: Self-pay | Admitting: Bariatrics

## 2022-05-15 NOTE — Progress Notes (Signed)
Stable Follow instructions as discussed during office visit.

## 2022-05-18 ENCOUNTER — Other Ambulatory Visit: Payer: Self-pay | Admitting: Bariatrics

## 2022-05-18 DIAGNOSIS — E1169 Type 2 diabetes mellitus with other specified complication: Secondary | ICD-10-CM

## 2022-05-21 ENCOUNTER — Ambulatory Visit (INDEPENDENT_AMBULATORY_CARE_PROVIDER_SITE_OTHER): Payer: 59 | Admitting: Bariatrics

## 2022-05-21 ENCOUNTER — Encounter: Payer: Self-pay | Admitting: Bariatrics

## 2022-05-21 VITALS — BP 122/80 | HR 68 | Temp 97.5°F | Ht 71.0 in | Wt 237.0 lb

## 2022-05-21 DIAGNOSIS — E1169 Type 2 diabetes mellitus with other specified complication: Secondary | ICD-10-CM

## 2022-05-21 DIAGNOSIS — Z6833 Body mass index (BMI) 33.0-33.9, adult: Secondary | ICD-10-CM

## 2022-05-21 DIAGNOSIS — I10 Essential (primary) hypertension: Secondary | ICD-10-CM | POA: Diagnosis not present

## 2022-05-21 DIAGNOSIS — E669 Obesity, unspecified: Secondary | ICD-10-CM | POA: Diagnosis not present

## 2022-05-21 DIAGNOSIS — Z7985 Long-term (current) use of injectable non-insulin antidiabetic drugs: Secondary | ICD-10-CM

## 2022-05-21 MED ORDER — SEMAGLUTIDE (2 MG/DOSE) 8 MG/3ML ~~LOC~~ SOPN: 2.0000 mg | PEN_INJECTOR | SUBCUTANEOUS | 0 refills | Status: DC

## 2022-05-21 NOTE — Progress Notes (Signed)
   WEIGHT SUMMARY AND BIOMETRICS   Weight Gained Since Last Visit: 1lb   Vitals Temp: (!) 97.5 F (36.4 C) BP: 122/80 Pulse Rate: 68 SpO2: 98 %   Anthropometric Measurements Height: 5\' 11"  (1.803 m) Weight: 237 lb (107.5 kg) BMI (Calculated): 33.07 Weight at Last Visit: 236lb Weight Lost Since Last Visit: 0 Weight Gained Since Last Visit: 1lb Starting Weight: 242lb Total Weight Loss (lbs): 5 lb (2.268 kg)   Body Composition  Body Fat %: 30.9 % Fat Mass (lbs): 73.4 lbs Muscle Mass (lbs): 156.4 lbs Total Body Water (lbs): 113.4 lbs Visceral Fat Rating : 19   Other Clinical Data Fasting: no Labs: no Today's Visit #: 54 Starting Date: 07/04/20    OBESITY Marvin [name] is here to discuss his progress with his obesity treatment plan along with follow-up of his obesity related diagnoses.     Nutrition Plan: the Category 2 plan - 70% adherence.  Current exercise: walking  Interim History:  He is up 1 lbs since his last visit.  Eating all of the food on the plan. and Protein intake is as prescribed  Pharmacotherapy: Marvin [name] is on Ozempic 2 mg SQ weekly Adverse side effects: None Hunger is moderately controlled.  Cravings are moderately controlled.  Assessment/Plan:   1. Type 2 diabetes mellitus with obesity  He is taking Ozempic as prescribed. He denies side effects.   Plan:  Will keep his water, protein, and fiber high.  Continue Ozempic.    2. Hypertension Hypertension well controlled.  Medication(s): Norvasc  BP Readings from Last 3 Encounters:  05/21/22 122/80  05/12/22 110/80  04/23/22 114/76   Lab Results  Component Value Date   CREATININE 1.16 05/12/2022   CREATININE 1.02 11/08/2021   CREATININE 0.85 09/18/2021   Lab Results  Component Value Date   GFR 64.38 05/12/2022   GFR 75.39 11/08/2021   GFR 72.75 05/11/2020    Plan: Continue all antihypertensives at current dosages. No added salt.     Generalized Obesity:  Current BMI BMI (Calculated): 33.07   Pharmacotherapy Plan Continue and refill  Ozempic 2 mg SQ weekly  Marvin [name] is currently in the action stage of change. As such, his goal is to continue with weight loss efforts.  He has agreed to the Category 2 plan.  Exercise goals: Older adults should determine their level of effort for physical activity relative to their level of fitness.   Behavioral modification strategies: no meal skipping, increase water intake, and increasing vegetables.  Marvin [name] has agreed to follow-up with our clinic in 4 weeks.   No orders of the defined types were placed in this encounter.   There are no discontinued medications.   No orders of the defined types were placed in this encounter.     Objective:   VITALS: Per patient if applicable, see vitals. GENERAL: Alert and in no acute distress. CARDIOPULMONARY: No increased WOB. Speaking in clear sentences.  PSYCH: Pleasant and cooperative. Speech normal rate and rhythm. Affect is appropriate. Insight and judgement are appropriate. Attention is focused, linear, and appropriate.  NEURO: Oriented as arrived to appointment on time with no prompting.   Attestation Statements:   This was prepared with the assistance of Presenter, broadcasting.  Occasional wrong-word or sound-a-like substitutions may have occurred due to the inherent limitations of voice recognition software.   Jearld Lesch, DO

## 2022-06-18 ENCOUNTER — Ambulatory Visit: Payer: 59 | Admitting: Bariatrics

## 2022-06-19 ENCOUNTER — Other Ambulatory Visit: Payer: Self-pay | Admitting: Bariatrics

## 2022-06-19 DIAGNOSIS — E669 Obesity, unspecified: Secondary | ICD-10-CM

## 2022-06-24 ENCOUNTER — Other Ambulatory Visit: Payer: Self-pay | Admitting: Nurse Practitioner

## 2022-06-24 DIAGNOSIS — J301 Allergic rhinitis due to pollen: Secondary | ICD-10-CM

## 2022-06-25 ENCOUNTER — Encounter: Payer: Self-pay | Admitting: Bariatrics

## 2022-06-25 ENCOUNTER — Ambulatory Visit (INDEPENDENT_AMBULATORY_CARE_PROVIDER_SITE_OTHER): Payer: 59 | Admitting: Bariatrics

## 2022-06-25 DIAGNOSIS — E669 Obesity, unspecified: Secondary | ICD-10-CM

## 2022-06-25 DIAGNOSIS — Z7985 Long-term (current) use of injectable non-insulin antidiabetic drugs: Secondary | ICD-10-CM

## 2022-06-25 DIAGNOSIS — Z6833 Body mass index (BMI) 33.0-33.9, adult: Secondary | ICD-10-CM | POA: Diagnosis not present

## 2022-06-25 DIAGNOSIS — E559 Vitamin D deficiency, unspecified: Secondary | ICD-10-CM | POA: Diagnosis not present

## 2022-06-25 DIAGNOSIS — E1169 Type 2 diabetes mellitus with other specified complication: Secondary | ICD-10-CM | POA: Diagnosis not present

## 2022-06-25 MED ORDER — VITAMIN D (ERGOCALCIFEROL) 1.25 MG (50000 UNIT) PO CAPS: 50000.0000 [IU] | ORAL_CAPSULE | ORAL | 0 refills | Status: DC

## 2022-06-25 MED ORDER — SEMAGLUTIDE (2 MG/DOSE) 8 MG/3ML ~~LOC~~ SOPN: 2.0000 mg | PEN_INJECTOR | SUBCUTANEOUS | 0 refills | Status: DC

## 2022-06-25 NOTE — Progress Notes (Signed)
   WEIGHT SUMMARY AND BIOMETRICS  Weight Lost Since Last Visit: 0  Weight Gained Since Last Visit: 0   Vitals Temp: 97.9 F (36.6 C) BP: 132/71 Pulse Rate: 70 SpO2: 98 %   Anthropometric Measurements Height: 5\' 11"  (1.803 m) Weight: 237 lb (107.5 kg) BMI (Calculated): 33.07 Weight at Last Visit: 237lb Weight Lost Since Last Visit: 0 Weight Gained Since Last Visit: 0 Starting Weight: 242lb Total Weight Loss (lbs): 5 lb (2.268 kg)   Body Composition  Body Fat %: 29.3 % Fat Mass (lbs): 69.4 lbs Muscle Mass (lbs): 159.6 lbs Total Body Water (lbs): 113.6 lbs Visceral Fat Rating : 18   Other Clinical Data Fasting: no Labs: no Today's Visit #: 28 Starting Date: 07/04/20    OBESITY Marvin [name] is here to discuss his progress with his obesity treatment plan along with follow-up of his obesity related diagnoses.     Nutrition Plan: the Category 2 plan - 70% adherence.  Current exercise:  Golf and fishing.   Interim History:  His weight remains the same. He is planning abdominal hernia surgery before his next visit, and will stop the Ozempic temporarily.  Eating all of the food on the plan. and Is not skipping meals  Pharmacotherapy: Marvin [name] is on Ozempic 2 mg SQ weekly Adverse side effects: None Hunger is well controlled.  Cravings are well controlled.  Assessment/Plan:   1. Vitamin D deficiency He is taking his vitamin D as directed.  He has adequate energy.  Plan: Rx: Vitamin D 50,000 international units 1 weekly #12 with no refills  2. Type 2 diabetes mellitus with obesity (HCC)  He is taking his Ozempic as directed and has been cutting his carbohydrates as directed.  Plan: Rx: Ozempic 2 mg into the skin weekly 1 month supply with no refills He will stop his Ozempic temporarily as per the surgeon's orders and resume after his surgery per surgeon's orders.     Generalized Obesity: Current BMI BMI (Calculated): 33.07   Pharmacotherapy  Plan Continue and refill  Ozempic 2 mg SQ weekly Will hold the medication per surgeon's orders and then resume Ozempic.   Marvin [name] is currently in the action stage of change. As such, his goal is to continue with weight loss efforts.  He has agreed to the Category 2 plan.  Exercise goals: Older adults with chronic conditions should understand whether and how their conditions affect their ability to do regular physical activity safely.  Behavioral modification strategies: no meal skipping, meal planning , better snacking choices, decreasing sodium intake, and avoiding temptations.  Marvin [name] has agreed to follow-up with our clinic in 4 weeks.       Objective:   VITALS: Per patient if applicable, see vitals. GENERAL: Alert and in no acute distress. CARDIOPULMONARY: No increased WOB. Speaking in clear sentences.  PSYCH: Pleasant and cooperative. Speech normal rate and rhythm. Affect is appropriate. Insight and judgement are appropriate. Attention is focused, linear, and appropriate.  NEURO: Oriented as arrived to appointment on time with no prompting.   Attestation Statements:    This was prepared with the assistance of Engineer, civil (consulting).  Occasional wrong-word or sound-a-like substitutions may have occurred due to the inherent limitations of voice recognition software.   Corinna Capra, DO

## 2022-06-25 NOTE — Progress Notes (Signed)
Sent message, via epic in basket, requesting orders in epic from surgeon.  

## 2022-06-30 NOTE — Progress Notes (Signed)
Anesthesia Review:  PCP: Alysia Penna  Chest x-ray : 1 v- 09/18/21  EKG : 09/18/21  Echo : Stress test: Cardiac Cath :  Activity level: can do a flight of stairs wihtout difficulty  Sleep Study/ CPAP : none  Fasting Blood Sugar :      / Checks Blood Sugar -- times a day:   Blood Thinner/ Instructions /Last Dose: ASA / Instructions/ Last Dose :    81 mg aspirin  Prediabetes- does not check glucose at home  Hgba1c-  07/02/22- 5.9  Last dose of Ozempic on 07/07/22

## 2022-06-30 NOTE — Progress Notes (Signed)
Second request for orders in [facility] spoke with Nicole at [facility].  

## 2022-06-30 NOTE — Patient Instructions (Signed)
SURGICAL WAITING ROOM VISITATION  Patients having surgery or a procedure may have no more than 2 support people in the waiting area - these visitors may rotate.    Children under the age of 40 must have an adult with them who is not the patient.  Due to an increase in RSV and influenza rates and associated hospitalizations, children ages 24 and under may not visit patients in [facility] hospitals.  If the patient needs to stay at the hospital during part of their recovery, the visitor guidelines for inpatient rooms apply. Pre-op nurse will coordinate an appropriate time for 1 support person to accompany patient in pre-op.  This support person may not rotate.    Please refer to the [facility] website for the visitor guidelines for Inpatients (after your surgery is over and you are in a regular room).       Your procedure is scheduled on:  07/15/22    Report to [facility] Main Entrance    Report to admitting at  0700 AM   Call this number if you have problems the morning of surgery [phone]   Do not eat food :After Midnight.   After Midnight you may have the following liquids until ______ AM/ PM DAY OF SURGERY  Water Non-Citrus Juices (without pulp, NO RED-Apple, White grape, White cranberry) Black Coffee (NO MILK/CREAM OR CREAMERS, sugar ok)  Clear Tea (NO MILK/CREAM OR CREAMERS, sugar ok) regular and decaf                             Plain Jell-O (NO RED)                                           Fruit ices (not with fruit pulp, NO RED)                                     Popsicles (NO RED)                                                               Sports drinks like Gatorade (NO RED)                     The day of surgery:  Drink ONE (1) Pre-Surgery Clear Ensure or G2 at AM the morning of surgery. Drink in one sitting. Do not sip.  This drink was given to you during your hospital  pre-op appointment visit. Nothing else to drink after completing the   Pre-Surgery Clear Ensure or G2.          If you have questions, please contact your surgeon's office.        Oral Hygiene is also important to reduce your risk of infection.                                    Remember - BRUSH YOUR TEETH THE MORNING OF SURGERY WITH YOUR REGULAR TOOTHPASTE  DENTURES WILL  BE REMOVED PRIOR TO SURGERY PLEASE DO NOT APPLY "Poly grip" OR ADHESIVES!!!   Do NOT smoke after Midnight   Take these medicines the morning of surgery with A SIP OF WATER:  allopurinol, amlodipine   DO NOT TAKE ANY ORAL DIABETIC MEDICATIONS DAY OF YOUR SURGERY  Bring CPAP mask and tubing day of surgery.                              You may not have any metal on your body including hair pins, jewelry, and body piercing             Do not wear make-up, lotions, powders, perfumes/cologne, or deodorant  Do not wear nail polish including gel and S&S, artificial/acrylic nails, or any other type of covering on natural nails including finger and toenails. If you have artificial nails, gel coating, etc. that needs to be removed by a nail salon please have this removed prior to surgery or surgery may need to be canceled/ delayed if the surgeon/ anesthesia feels like they are unable to be safely monitored.   Do not shave  48 hours prior to surgery.               Men may shave face and neck.   Do not bring valuables to the hospital. [facility] IS NOT             RESPONSIBLE   FOR VALUABLES.   Contacts, glasses, dentures or bridgework may not be worn into surgery.   Bring small overnight bag day of surgery.   DO NOT BRING YOUR HOME MEDICATIONS TO THE HOSPITAL. PHARMACY WILL DISPENSE MEDICATIONS LISTED ON YOUR MEDICATION LIST TO YOU DURING YOUR ADMISSION IN THE HOSPITAL!    Patients discharged on the day of surgery will not be allowed to drive home.  Someone NEEDS to stay with you for the first 24 hours after anesthesia.   Special Instructions: Bring a copy of your healthcare power of  attorney and living will documents the day of surgery if you haven't scanned them before.              Please read over the following fact sheets you were given: IF YOU HAVE QUESTIONS ABOUT YOUR PRE-OP INSTRUCTIONS PLEASE CALL [phone]   If you received a COVID test during your pre-op visit  it is requested that you wear a mask when out in public, stay away from anyone that may not be feeling well and notify your surgeon if you develop symptoms. If you test positive for Covid or have been in contact with anyone that has tested positive in the last 10 days please notify you surgeon.    [facility] - Preparing for Surgery Before surgery, you can play an important role.  Because skin is not sterile, your skin needs to be as free of germs as possible.  You can reduce the number of germs on your skin by washing with CHG (chlorahexidine gluconate) soap before surgery.  CHG is an antiseptic cleaner which kills germs and bonds with the skin to continue killing germs even after washing. Please DO NOT use if you have an allergy to CHG or antibacterial soaps.  If your skin becomes reddened/irritated stop using the CHG and inform your nurse when you arrive at Short Stay. Do not shave (including legs and underarms) for at least 48 hours prior to the first CHG shower.  You may shave your  face/neck. Please follow these instructions carefully:  1.  Shower with CHG Soap the night before surgery and the  morning of Surgery.  2.  If you choose to wash your hair, wash your hair first as usual with your  normal  shampoo.  3.  After you shampoo, rinse your hair and body thoroughly to remove the  shampoo.                           4.  Use CHG as you would any other liquid soap.  You can apply chg directly  to the skin and wash                       Gently with a scrungie or clean washcloth.  5.  Apply the CHG Soap to your body ONLY FROM THE NECK DOWN.   Do not use on face/ open                           Wound or open  sores. Avoid contact with eyes, ears mouth and genitals (private parts).                       Wash face,  Genitals (private parts) with your normal soap.             6.  Wash thoroughly, paying special attention to the area where your surgery  will be performed.  7.  Thoroughly rinse your body with warm water from the neck down.  8.  DO NOT shower/wash with your normal soap after using and rinsing off  the CHG Soap.                9.  Pat yourself dry with a clean towel.            10.  Wear clean pajamas.            11.  Place clean sheets on your bed the night of your first shower and do not  sleep with pets. Day of Surgery : Do not apply any lotions/deodorants the morning of surgery.  Please wear clean clothes to the hospital/surgery center.  FAILURE TO FOLLOW THESE INSTRUCTIONS MAY RESULT IN THE CANCELLATION OF YOUR SURGERY PATIENT SIGNATURE_________________________________  NURSE SIGNATURE__________________________________  ________________________________________________________________________

## 2022-07-02 ENCOUNTER — Other Ambulatory Visit: Payer: Self-pay

## 2022-07-02 ENCOUNTER — Encounter (HOSPITAL_COMMUNITY)
Admission: RE | Admit: 2022-07-02 | Discharge: 2022-07-02 | Disposition: A | Discharge status: Home / Self Care | Payer: 59 | Source: Ambulatory Visit | Attending: Surgery | Admitting: Surgery

## 2022-07-02 ENCOUNTER — Encounter (HOSPITAL_COMMUNITY): Payer: Self-pay

## 2022-07-02 VITALS — BP 139/98 | HR 64 | Temp 98.1°F | Resp 16 | Ht 70.0 in | Wt 240.0 lb

## 2022-07-02 DIAGNOSIS — I10 Essential (primary) hypertension: Secondary | ICD-10-CM | POA: Insufficient documentation

## 2022-07-02 DIAGNOSIS — E785 Hyperlipidemia, unspecified: Secondary | ICD-10-CM | POA: Insufficient documentation

## 2022-07-02 DIAGNOSIS — Z01812 Encounter for preprocedural laboratory examination: Secondary | ICD-10-CM | POA: Insufficient documentation

## 2022-07-02 DIAGNOSIS — E1169 Type 2 diabetes mellitus with other specified complication: Secondary | ICD-10-CM | POA: Insufficient documentation

## 2022-07-02 DIAGNOSIS — Z01818 Encounter for other preprocedural examination: Secondary | ICD-10-CM

## 2022-07-02 LAB — HEMOGLOBIN A1C
Hgb A1c MFr Bld: 5.9 % — ABNORMAL HIGH (ref 4.8–5.6)
Mean Plasma Glucose: 122.63 mg/dL

## 2022-07-02 LAB — CBC
HCT: 44.8 % (ref 39.0–52.0)
Hemoglobin: 14.8 g/dL (ref 13.0–17.0)
MCH: 27.6 pg (ref 26.0–34.0)
MCHC: 33 g/dL (ref 30.0–36.0)
MCV: 83.6 fL (ref 80.0–100.0)
Platelets: 215 10*3/uL (ref 150–400)
RBC: 5.36 MIL/uL (ref 4.22–5.81)
RDW: 14.3 % (ref 11.5–15.5)
WBC: 8.2 10*3/uL (ref 4.0–10.5)
nRBC: 0 % (ref 0.0–0.2)

## 2022-07-02 LAB — BASIC METABOLIC PANEL
Anion gap: 8 (ref 5–15)
BUN: 16 mg/dL (ref 8–23)
CO2: 24 mmol/L (ref 22–32)
Calcium: 9.5 mg/dL (ref 8.9–10.3)
Chloride: 106 mmol/L (ref 98–111)
Creatinine, Ser: 1.08 mg/dL (ref 0.61–1.24)
GFR, Estimated: >60 mL/min (ref >60)
Glucose, Bld: 95 mg/dL (ref 70–99)
Potassium: 4.3 mmol/L (ref 3.5–5.1)
Sodium: 138 mmol/L (ref 135–145)

## 2022-07-02 LAB — GLUCOSE, CAPILLARY: Glucose-Capillary: 99 mg/dL (ref 70–99)

## 2022-07-10 ENCOUNTER — Ambulatory Visit: Payer: Self-pay | Admitting: Surgery

## 2022-07-13 NOTE — Anesthesia Preprocedure Evaluation (Signed)
Anesthesia Evaluation  Patient identified by MRN, date of birth, ID band Patient awake    Reviewed: Allergy & Precautions, NPO status , Patient's Chart, lab work & pertinent test results  History of Anesthesia Complications Negative for: history of anesthetic complications  Airway Mallampati: III  TM Distance: >3 FB Neck ROM: Full   Comment: Previous grade I view with MAC 4, easy mask Dental  (+) Partial Lower, Dental Advisory Given   Pulmonary neg shortness of breath, neg sleep apnea, neg COPD, neg recent URI, former smoker   Pulmonary exam normal breath sounds clear to auscultation       Cardiovascular hypertension (amlodipine), Pt. on medications (-) angina (-) Past MI, (-) Cardiac Stents and (-) CABG (-) dysrhythmias  Rhythm:Regular Rate:Normal  HLD   Neuro/Psych neg Seizures  Neuromuscular disease (chronic bilateral low back pain)    GI/Hepatic PUD,GERD  ,,(+)     substance abuse (h/o alcohol and drug abuse)    Endo/Other  Pre-diabetes  Renal/GU negative Renal ROS     Musculoskeletal  (+) Arthritis , Osteoarthritis,    Abdominal  (+) + obese  Peds  Hematology negative hematology ROS (+)   Anesthesia Other Findings Last semiglutide: 07/07/2022  Reproductive/Obstetrics                             Anesthesia Physical Anesthesia Plan  ASA: 2  Anesthesia Plan: General   Post-op Pain Management: Tylenol PO (pre-op)*   Induction: Intravenous  PONV Risk Score and Plan: 2 and Ondansetron, Dexamethasone and Treatment may vary due to age or medical condition  Airway Management Planned: Oral ETT  Additional Equipment:   Intra-op Plan:   Post-operative Plan: Extubation in OR  Informed Consent: I have reviewed the patients History and Physical, chart, labs and discussed the procedure including the risks, benefits and alternatives for the proposed anesthesia with the patient or  authorized representative who has indicated his/her understanding and acceptance.     Dental advisory given  Plan Discussed with: CRNA and Anesthesiologist  Anesthesia Plan Comments: (Risks of general anesthesia discussed including, but not limited to, sore throat, hoarse voice, chipped/damaged teeth, injury to vocal cords, nausea and vomiting, allergic reactions, lung infection, heart attack, stroke, and death. All questions answered. )        Anesthesia Quick Evaluation

## 2022-07-14 NOTE — H&P (Signed)
Chief Complaint:  recurrent LIH  History of Present Illness:  Marvin [name] is an 72 y.o. male who works at [employer] and has experienced a recurrent LIH.  He was seen in the office and discussion of an open LIH was done.    Past Medical History:  Diagnosis Date   Alcohol abuse    Allergy    Arthritis    Back pain    Constipation 03/15/2020   Drug abuse (HCC)    Gout    Gout 05/20/2016   Hernia    Hypercholesteremia    Hypertension    Joint pain    Obesity    Osteoarthritis    Pre-diabetes    Prediabetes    Stomach ulcer    Swallowing difficulty    Wears dentures    bottom front    Past Surgical History:  Procedure Laterality Date   HERNIA REPAIR     twice   left inguinal hernia repair      left shoulder rotator cuff repair      REPLACEMENT TOTAL KNEE     partial   right knee replacement      SHOULDER SURGERY     right   TOTAL HIP ARTHROPLASTY Right 10/03/2019   Procedure: RIGHT TOTAL HIP ARTHROPLASTY ANTERIOR APPROACH;  Surgeon: Tarry Kos, MD;  Location: [facility];  Service: Orthopedics;  Laterality: Right;   UMBILICAL HERNIA REPAIR     x 2    No current facility-administered medications for this encounter.   Current Outpatient Medications  Medication Sig Dispense Refill   allopurinol (ZYLOPRIM) 100 MG tablet Take 1 tablet (100 mg total) by mouth daily. 90 tablet 3   amLODipine (NORVASC) 10 MG tablet Take 1 tablet (10 mg total) by mouth daily. 90 tablet 3   amoxicillin (AMOXIL) 500 MG tablet Take 2,000 mg by mouth once.     aspirin EC 81 MG tablet Take 1 tablet (81 mg total) by mouth daily. Swallow whole. 30 tablet 12   colchicine 0.6 MG tablet Take 0.6 mg by mouth daily as needed (Gout).     cyclobenzaprine (FLEXERIL) 5 MG tablet Take 1 tablet (5 mg total) by mouth daily as needed for muscle spasms. 30 tablet 0   fluticasone (FLONASE) 50 MCG/ACT nasal spray Place 2 sprays into both nostrils daily as needed for allergies or rhinitis.     predniSONE (STERAPRED  UNI-PAK 21 TAB) 10 MG (21) TBPK tablet Take 10 mg by mouth daily as needed (bursitis).     Semaglutide, 2 MG/DOSE, 8 MG/3ML SOPN Inject 2 mg as directed once a week. 3 mL 0   simvastatin (ZOCOR) 20 MG tablet Take 1 tablet (20 mg total) by mouth 2 (two) times a week. 12 tablet 3   Vitamin D, Ergocalciferol, (DRISDOL) 1.25 MG (50000 UNIT) CAPS capsule Take 1 capsule (50,000 Units total) by mouth every 7 (seven) days. 12 capsule 0   Buprenorphine hcl and Morphine and codeine Family History  Problem Relation Age of Onset   Healthy Mother    Healthy Father    Cancer Brother        bone cancer   Cancer Maternal Aunt        breast cancer   Cancer Maternal Uncle        lukemia   Cancer Cousin        unknown   Cancer Maternal Uncle        lukemia   Cancer Paternal Uncle  prostate   Hearing loss Maternal Grandfather 71   Social History:   reports that he quit smoking about 24 years ago. His smoking use included cigarettes. He has quit using smokeless tobacco.  His smokeless tobacco use included chew. He reports that he does not currently use alcohol. He reports that he does not currently use drugs.   REVIEW OF SYSTEMS : Negative except for see problem list  Physical Exam:   There were no vitals taken for this visit. There is no height or weight on file to calculate BMI.  Gen:  WDWN AAM NAD  Neurological: Alert and oriented to person, place, and time. Motor and sensory function is grossly intact  Head: Normocephalic and atraumatic.  Eyes: Conjunctivae are normal. Pupils are equal, round, and reactive to light. No scleral icterus.  Neck: Normal range of motion. Neck supple. No tracheal deviation or thyromegaly present.  Cardiovascular:  SR without murmurs or gallops.  No carotid bruits Breast:  not examined Respiratory: Effort normal.  No respiratory distress. No chest wall tenderness. Breath sounds normal.  No wheezes, rales or rhonchi.  Abdomen:  nontender GU:  recurrent  LIH Musculoskeletal: Normal range of motion. Extremities are nontender. No cyanosis, edema or clubbing noted Lymphadenopathy: No cervical, preauricular, postauricular or axillary adenopathy is present Skin: Skin is warm and dry. No rash noted. No diaphoresis. No erythema. No pallor. Pscyh: Normal mood and affect. Behavior is normal. Judgment and thought content normal.   LABORATORY RESULTS: No results found for this or any previous visit (from the past 48 hour(s)).   RADIOLOGY RESULTS: No results found.  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  For open repair.  He is aware of the risks and benefits of repair     [name], MD, [facility] [phone] beeper [phone]  07/14/2022 10:18 PM

## 2022-07-15 ENCOUNTER — Ambulatory Visit (HOSPITAL_BASED_OUTPATIENT_CLINIC_OR_DEPARTMENT_OTHER): Payer: 59 | Admitting: Anesthesiology

## 2022-07-15 ENCOUNTER — Encounter (HOSPITAL_COMMUNITY)
Admission: RE | Disposition: A | Discharge status: Home / Self Care | Payer: Self-pay | Source: Home / Self Care | Attending: Surgery

## 2022-07-15 ENCOUNTER — Other Ambulatory Visit: Payer: Self-pay

## 2022-07-15 ENCOUNTER — Ambulatory Visit (HOSPITAL_COMMUNITY)
Admission: RE | Admit: 2022-07-15 | Discharge: 2022-07-15 | Disposition: A | Discharge status: Home / Self Care | Payer: 59 | Attending: Surgery | Admitting: Surgery

## 2022-07-15 ENCOUNTER — Ambulatory Visit (HOSPITAL_COMMUNITY): Payer: 59 | Admitting: Physician Assistant

## 2022-07-15 ENCOUNTER — Encounter (HOSPITAL_COMMUNITY): Payer: Self-pay | Admitting: Surgery

## 2022-07-15 DIAGNOSIS — I10 Essential (primary) hypertension: Secondary | ICD-10-CM

## 2022-07-15 DIAGNOSIS — Z79899 Other long term (current) drug therapy: Secondary | ICD-10-CM | POA: Insufficient documentation

## 2022-07-15 DIAGNOSIS — Z8711 Personal history of peptic ulcer disease: Secondary | ICD-10-CM | POA: Insufficient documentation

## 2022-07-15 DIAGNOSIS — K4091 Unilateral inguinal hernia, without obstruction or gangrene, recurrent: Secondary | ICD-10-CM

## 2022-07-15 DIAGNOSIS — Z7985 Long-term (current) use of injectable non-insulin antidiabetic drugs: Secondary | ICD-10-CM | POA: Diagnosis not present

## 2022-07-15 DIAGNOSIS — Z87891 Personal history of nicotine dependence: Secondary | ICD-10-CM

## 2022-07-15 DIAGNOSIS — E669 Obesity, unspecified: Secondary | ICD-10-CM | POA: Diagnosis not present

## 2022-07-15 DIAGNOSIS — M1611 Unilateral primary osteoarthritis, right hip: Secondary | ICD-10-CM | POA: Insufficient documentation

## 2022-07-15 DIAGNOSIS — Z01818 Encounter for other preprocedural examination: Secondary | ICD-10-CM

## 2022-07-15 HISTORY — PX: INGUINAL HERNIA REPAIR: SHX194

## 2022-07-15 LAB — GLUCOSE, CAPILLARY
Glucose-Capillary: 110 mg/dL — ABNORMAL HIGH (ref 70–99)
Glucose-Capillary: 110 mg/dL — ABNORMAL HIGH (ref 70–99)

## 2022-07-15 SURGERY — REPAIR, HERNIA, INGUINAL, ADULT
Anesthesia: General | Laterality: Left

## 2022-07-15 MED ORDER — ONDANSETRON HCL 4 MG/2ML IJ SOLN
INTRAMUSCULAR | Status: AC
  Filled 2022-07-15: qty 2

## 2022-07-15 MED ORDER — BUPIVACAINE LIPOSOME 1.3 % IJ SUSP
INTRAMUSCULAR | Status: AC
  Filled 2022-07-15: qty 20

## 2022-07-15 MED ORDER — LIDOCAINE 2% (20 MG/ML) 5 ML SYRINGE
INTRAMUSCULAR | Status: DC | PRN
  Administered 2022-07-15: 100 mg via INTRAVENOUS

## 2022-07-15 MED ORDER — FENTANYL CITRATE (PF) 100 MCG/2ML IJ SOLN
INTRAMUSCULAR | Status: AC
  Filled 2022-07-15: qty 2

## 2022-07-15 MED ORDER — PROPOFOL 10 MG/ML IV BOLUS
INTRAVENOUS | Status: AC
  Filled 2022-07-15: qty 20

## 2022-07-15 MED ORDER — CHLORHEXIDINE GLUCONATE CLOTH 2 % EX PADS: 6.0000 | MEDICATED_PAD | Freq: Once | CUTANEOUS | Status: DC

## 2022-07-15 MED ORDER — CEFAZOLIN SODIUM-DEXTROSE 2-4 GM/100ML-% IV SOLN
2.0000 g | INTRAVENOUS | Status: AC
  Administered 2022-07-15: 2 g via INTRAVENOUS
  Filled 2022-07-15: qty 100

## 2022-07-15 MED ORDER — BUPIVACAINE LIPOSOME 1.3 % IJ SUSP: 20.0000 mL | Freq: Once | INTRAMUSCULAR | Status: DC

## 2022-07-15 MED ORDER — ORAL CARE MOUTH RINSE: 15.0000 mL | Freq: Once | OROMUCOSAL | Status: AC

## 2022-07-15 MED ORDER — DEXAMETHASONE SODIUM PHOSPHATE 10 MG/ML IJ SOLN
INTRAMUSCULAR | Status: AC
  Filled 2022-07-15: qty 1

## 2022-07-15 MED ORDER — OXYCODONE HCL 5 MG/5ML PO SOLN: 5.0000 mg | Freq: Once | ORAL | Status: DC | PRN

## 2022-07-15 MED ORDER — MIDAZOLAM HCL 2 MG/2ML IJ SOLN
INTRAMUSCULAR | Status: AC
  Filled 2022-07-15: qty 2

## 2022-07-15 MED ORDER — SODIUM CHLORIDE (PF) 0.9 % IJ SOLN
INTRAMUSCULAR | Status: DC | PRN
  Administered 2022-07-15: 30 mL

## 2022-07-15 MED ORDER — OXYCODONE HCL 5 MG PO TABS: 5.0000 mg | ORAL_TABLET | Freq: Once | ORAL | Status: DC | PRN

## 2022-07-15 MED ORDER — MIDAZOLAM HCL 5 MG/5ML IJ SOLN
INTRAMUSCULAR | Status: DC | PRN
  Administered 2022-07-15: 2 mg via INTRAVENOUS

## 2022-07-15 MED ORDER — ROCURONIUM BROMIDE 10 MG/ML (PF) SYRINGE
PREFILLED_SYRINGE | INTRAVENOUS | Status: AC
  Filled 2022-07-15: qty 10

## 2022-07-15 MED ORDER — SCOPOLAMINE 1 MG/3DAYS TD PT72
1.0000 | MEDICATED_PATCH | TRANSDERMAL | Status: DC
  Administered 2022-07-15: 1.5 mg via TRANSDERMAL
  Filled 2022-07-15: qty 1

## 2022-07-15 MED ORDER — FENTANYL CITRATE (PF) 100 MCG/2ML IJ SOLN
INTRAMUSCULAR | Status: DC | PRN
  Administered 2022-07-15: 100 ug via INTRAVENOUS
  Administered 2022-07-15 (×2): 50 ug via INTRAVENOUS

## 2022-07-15 MED ORDER — SUGAMMADEX SODIUM 200 MG/2ML IV SOLN
INTRAVENOUS | Status: DC | PRN
  Administered 2022-07-15: 200 mg via INTRAVENOUS

## 2022-07-15 MED ORDER — SODIUM CHLORIDE (PF) 0.9 % IJ SOLN
INTRAMUSCULAR | Status: AC
  Filled 2022-07-15: qty 10

## 2022-07-15 MED ORDER — LACTATED RINGERS IV SOLN: INTRAVENOUS | Status: DC

## 2022-07-15 MED ORDER — 0.9 % SODIUM CHLORIDE (POUR BTL) OPTIME
TOPICAL | Status: DC | PRN
  Administered 2022-07-15: 1000 mL

## 2022-07-15 MED ORDER — LIDOCAINE HCL (PF) 2 % IJ SOLN
INTRAMUSCULAR | Status: AC
  Filled 2022-07-15: qty 5

## 2022-07-15 MED ORDER — ROCURONIUM BROMIDE 10 MG/ML (PF) SYRINGE
PREFILLED_SYRINGE | INTRAVENOUS | Status: DC | PRN
  Administered 2022-07-15: 20 mg via INTRAVENOUS
  Administered 2022-07-15: 50 mg via INTRAVENOUS

## 2022-07-15 MED ORDER — PROPOFOL 10 MG/ML IV BOLUS
INTRAVENOUS | Status: DC | PRN
  Administered 2022-07-15: 150 mg via INTRAVENOUS

## 2022-07-15 MED ORDER — AMISULPRIDE (ANTIEMETIC) 5 MG/2ML IV SOLN: 10.0000 mg | Freq: Once | INTRAVENOUS | Status: DC | PRN

## 2022-07-15 MED ORDER — CHLORHEXIDINE GLUCONATE 0.12 % MT SOLN
15.0000 mL | Freq: Once | OROMUCOSAL | Status: AC
  Administered 2022-07-15: 15 mL via OROMUCOSAL

## 2022-07-15 MED ORDER — DEXAMETHASONE SODIUM PHOSPHATE 10 MG/ML IJ SOLN
INTRAMUSCULAR | Status: DC | PRN
  Administered 2022-07-15: 8 mg via INTRAVENOUS

## 2022-07-15 MED ORDER — ONDANSETRON HCL 4 MG/2ML IJ SOLN
INTRAMUSCULAR | Status: DC | PRN
  Administered 2022-07-15: 4 mg via INTRAVENOUS

## 2022-07-15 MED ORDER — ACETAMINOPHEN 500 MG PO TABS
1000.0000 mg | ORAL_TABLET | ORAL | Status: AC
  Administered 2022-07-15: 1000 mg via ORAL
  Filled 2022-07-15: qty 2

## 2022-07-15 MED ORDER — OXYCODONE HCL 5 MG PO TABS
5.0000 mg | ORAL_TABLET | Freq: Four times a day (QID) | ORAL | 0 refills | Status: DC | PRN
Start: 2022-07-15 — End: 2022-09-24

## 2022-07-15 MED ORDER — FENTANYL CITRATE PF 50 MCG/ML IJ SOSY: 25.0000 ug | PREFILLED_SYRINGE | INTRAMUSCULAR | Status: DC | PRN

## 2022-07-15 SURGICAL SUPPLY — 36 items
ADH SKN CLS APL DERMABOND .7 (GAUZE/BANDAGES/DRESSINGS) ×1
BAG COUNTER SPONGE SURGICOUNT (BAG) IMPLANT
BAG SPNG CNTER NS LX DISP (BAG)
BLADE SURG 15 STRL LF DISP TIS (BLADE) ×2 IMPLANT
BLADE SURG 15 STRL SS (BLADE) ×1
COVER SURGICAL LIGHT HANDLE (MISCELLANEOUS) ×2 IMPLANT
DERMABOND ADVANCED .7 DNX12 (GAUZE/BANDAGES/DRESSINGS) IMPLANT
DISSECTOR ROUND CHERRY 3/8 STR (MISCELLANEOUS) IMPLANT
DRAIN PENROSE 0.5X18 (DRAIN) ×2 IMPLANT
DRAPE LAPAROTOMY TRNSV 102X78 (DRAPES) ×2 IMPLANT
ELECT REM PT RETURN 15FT ADLT (MISCELLANEOUS) ×2 IMPLANT
GLOVE SURG LX STRL 8.0 MICRO (GLOVE) ×2 IMPLANT
GOWN STRL REUS W/ TWL XL LVL3 (GOWN DISPOSABLE) ×4 IMPLANT
GOWN STRL REUS W/TWL XL LVL3 (GOWN DISPOSABLE) ×2
KIT BASIN OR (CUSTOM PROCEDURE TRAY) ×2 IMPLANT
KIT TURNOVER KIT A (KITS) IMPLANT
MARKER SKIN DUAL TIP RULER LAB (MISCELLANEOUS) ×2 IMPLANT
MESH HERNIA 3X6 (Mesh General) IMPLANT
NDL HYPO 22X1.5 SAFETY MO (MISCELLANEOUS) ×2 IMPLANT
NEEDLE HYPO 22X1.5 SAFETY MO (MISCELLANEOUS) ×1 IMPLANT
PACK BASIC VI WITH GOWN DISP (CUSTOM PROCEDURE TRAY) ×2 IMPLANT
PENCIL SMOKE EVACUATOR (MISCELLANEOUS) IMPLANT
SPIKE FLUID TRANSFER (MISCELLANEOUS) ×2 IMPLANT
SPONGE T-LAP 4X18 ~~LOC~~+RFID (SPONGE) ×2 IMPLANT
STAPLER VISISTAT 35W (STAPLE) IMPLANT
SUT PROLENE 2 0 CT2 30 (SUTURE) ×4 IMPLANT
SUT SILK 2 0 SH (SUTURE) IMPLANT
SUT VIC AB 2-0 SH 27 (SUTURE) ×1
SUT VIC AB 2-0 SH 27X BRD (SUTURE) ×2 IMPLANT
SUT VIC AB 4-0 SH 18 (SUTURE) ×2 IMPLANT
SUT VICRYL 4-0 (SUTURE) ×2 IMPLANT
SYR 20ML LL LF (SYRINGE) ×2 IMPLANT
SYR BULB IRRIG 60ML STRL (SYRINGE) ×2 IMPLANT
TOWEL OR 17X26 10 PK STRL BLUE (TOWEL DISPOSABLE) ×2 IMPLANT
TOWEL OR NON WOVEN STRL DISP B (DISPOSABLE) ×2 IMPLANT
YANKAUER SUCT BULB TIP 10FT TU (MISCELLANEOUS) IMPLANT

## 2022-07-15 NOTE — Transfer of Care (Signed)
Immediate Anesthesia Transfer of Care Note  Patient: Marvin [name]  Procedure(s) Performed: OPEN LEFT INGUINAL HERNIA REPAIR (Left)  Patient Location: PACU  Anesthesia Type:General  Level of Consciousness: awake, alert , oriented, and patient cooperative  Airway & Oxygen Therapy: Patient Spontanous Breathing and Patient connected to face mask oxygen  Post-op Assessment: Report given to RN, Post -op Vital signs reviewed and stable, and Patient moving all extremities  Post vital signs: Reviewed and stable  Last Vitals:  Vitals Value Taken Time  BP 144/88 07/15/22 1112  Temp    Pulse 80 07/15/22 1113  Resp 22 07/15/22 1113  SpO2 98 % 07/15/22 1113  Vitals shown include unvalidated device data.  Last Pain:  Vitals:   07/15/22 0721  TempSrc:   PainSc: 0-No pain         Complications: No notable events documented.

## 2022-07-15 NOTE — Interval H&P Note (Signed)
History and Physical Interval Note:  07/15/2022 8:58 AM  [name]  has presented today for surgery, with the diagnosis of RECURRENT LEFT INGUINAL HERNIA.  The various methods of treatment have been discussed with the patient and family. After consideration of risks, benefits and other options for treatment, the patient has consented to  Procedure(s): OPEN LEFT INGUINAL HERNIA REPAIR (Left) as a surgical intervention.  The patient's history has been reviewed, patient examined, no change in status, stable for surgery.  I have reviewed the patient's chart and labs.  Questions were answered to the patient's satisfaction.     [name]

## 2022-07-15 NOTE — Anesthesia Procedure Notes (Signed)
Procedure Name: Intubation Date/Time: 07/15/2022 9:07 AM  Performed by: Elisabeth Cara, CRNAPre-anesthesia Checklist: Patient identified, Emergency Drugs available, Suction available, Patient being monitored and Timeout performed Patient Re-evaluated:Patient Re-evaluated prior to induction Oxygen Delivery Method: Circle system utilized Preoxygenation: Pre-oxygenation with 100% oxygen Induction Type: IV induction Ventilation: Mask ventilation without difficulty Laryngoscope Size: Mac and 4 Grade View: Grade I Tube type: Oral Tube size: 7.5 mm Number of attempts: 1 Airway Equipment and Method: Stylet Placement Confirmation: ETT inserted through vocal cords under direct vision, breath sounds checked- equal and bilateral and positive ETCO2 Secured at: 23 cm Tube secured with: Tape Dental Injury: Teeth and Oropharynx as per pre-operative assessment

## 2022-07-15 NOTE — Anesthesia Postprocedure Evaluation (Signed)
Anesthesia Post Note  Patient: Marvin [name]  Procedure(s) Performed: OPEN LEFT INGUINAL HERNIA REPAIR (Left)     Patient location during evaluation: PACU Anesthesia Type: General Level of consciousness: awake Pain management: pain level controlled Vital Signs Assessment: post-procedure vital signs reviewed and stable Respiratory status: spontaneous breathing, nonlabored ventilation and respiratory function stable Cardiovascular status: blood pressure returned to baseline and stable Postop Assessment: no apparent nausea or vomiting Anesthetic complications: no   No notable events documented.  Last Vitals:  Vitals:   07/15/22 1145 07/15/22 1157  BP: 133/85 130/80  Pulse: 74 81  Resp: 16   Temp:    SpO2: 96% 93%    Last Pain:  Vitals:   07/15/22 1157  TempSrc:   PainSc: 0-No pain                 Linton Rump

## 2022-07-15 NOTE — Op Note (Signed)
Marvin [name]  1950-07-08   07/15/2022    PCP:  Anne Ng, NP   Surgeon: Wenda Low, MD, FACS  Asst:  none  Anes:  general  Preop Dx: Recurrent left inguinal hernia Postop Dx: Left indirect/direct inguinal hernia lateral to prior mesh placement in TAPP  Procedure: Open left inguinal hernia repair with mesh Location Surgery: [facility] 1 Complications: None noted  EBL:   minimal cc  Drains: none  Description of Procedure:  The patient was taken to OR 1 .  After anesthesia was administered and the patient was prepped  with chloroprep  and a timeout was performed.  10 cc of Exparel was injected around the anterior iliac spine on the left side.  An oblique incision was made and carried down through a fairly generous pannus and the external oblique fibers were notified and divided to the external ring to open the whole area.  The cord was very thickened it was probably about 2 cm in diameter and had what felt to be sliding hernia.  It was mobilized and Penrose placed about it.  When the dissection was continued I could see that the floor of the inguinal canal was extremely attenuated.  Once I had this mobilized I would put in interrupted Prolene's to approximate that.  However for one-handed apply the hernia sac from the vas and vessels and when completely free I was able to reduce that completely.  The floor was then reconstructed with interrupted Prolene to the mention to snug up the ring.  The repair was completed using a piece of Marlex type mesh cutting and splitting and suturing along the inguinal ligament with a running Prolene carrying beyond the ring and then medially with a running Prolene.  The nerve was reflected in the superior flap of external oblique and the 2 wings of the mesh were approximated with a horizontal mattress suture of the 2-0 Prolene.  Bleeding was controlled.  I injected the area with more of the Exparel cleaning the floor and then medially and the cord  structures.  Area was irrigated and the wound was closed in layers including Scarpa's with 4-0 Vicryl and subcuticular with 4-0 Vicryl and then with a running subcuticular 4-0 Monocryl and Dermabond.  The patient tolerated the procedure well and was taken to the PACU in stable condition.     [name], MD, [facility], [place] [phone]

## 2022-07-16 ENCOUNTER — Encounter (HOSPITAL_COMMUNITY): Payer: Self-pay | Admitting: Surgery

## 2022-07-17 ENCOUNTER — Other Ambulatory Visit: Payer: Self-pay

## 2022-07-17 ENCOUNTER — Encounter: Payer: Self-pay | Admitting: Nurse Practitioner

## 2022-07-17 ENCOUNTER — Other Ambulatory Visit: Payer: Self-pay | Admitting: Nurse Practitioner

## 2022-07-17 MED ORDER — FLUTICASONE PROPIONATE 50 MCG/ACT NA SUSP
2.0000 | Freq: Every day | NASAL | 0 refills | Status: DC | PRN
  Filled 2022-07-17: qty 16, fill #0

## 2022-07-18 ENCOUNTER — Other Ambulatory Visit: Payer: Self-pay

## 2022-07-18 ENCOUNTER — Telehealth: Payer: Self-pay

## 2022-07-18 MED ORDER — FLUTICASONE PROPIONATE 50 MCG/ACT NA SUSP: 2.0000 | Freq: Every day | NASAL | 0 refills | Status: DC | PRN

## 2022-07-18 NOTE — Telephone Encounter (Signed)
Marvin [name] [phone]  Pt got your message and called, stated that he does not use any [facility] pharmacy and wants all his meds sent to [facility] at [facility]. He is looking for Rx #: [id number]  fluticasone (FLONASE) 50 MCG/ACT nasal spray [[id number]]

## 2022-07-19 ENCOUNTER — Other Ambulatory Visit: Payer: Self-pay | Admitting: Bariatrics

## 2022-07-19 DIAGNOSIS — E1169 Type 2 diabetes mellitus with other specified complication: Secondary | ICD-10-CM

## 2022-07-23 ENCOUNTER — Encounter: Payer: Self-pay | Admitting: Bariatrics

## 2022-07-23 ENCOUNTER — Ambulatory Visit (INDEPENDENT_AMBULATORY_CARE_PROVIDER_SITE_OTHER): Payer: 59 | Admitting: Bariatrics

## 2022-07-23 DIAGNOSIS — E1169 Type 2 diabetes mellitus with other specified complication: Secondary | ICD-10-CM | POA: Diagnosis not present

## 2022-07-23 DIAGNOSIS — Z6832 Body mass index (BMI) 32.0-32.9, adult: Secondary | ICD-10-CM | POA: Diagnosis not present

## 2022-07-23 DIAGNOSIS — E559 Vitamin D deficiency, unspecified: Secondary | ICD-10-CM | POA: Diagnosis not present

## 2022-07-23 DIAGNOSIS — Z7985 Long-term (current) use of injectable non-insulin antidiabetic drugs: Secondary | ICD-10-CM

## 2022-07-23 DIAGNOSIS — E669 Obesity, unspecified: Secondary | ICD-10-CM

## 2022-07-23 MED ORDER — SEMAGLUTIDE (2 MG/DOSE) 8 MG/3ML ~~LOC~~ SOPN
2.0000 mg | PEN_INJECTOR | SUBCUTANEOUS | 0 refills | Status: DC
Start: 2022-07-23 — End: 2022-08-20

## 2022-07-23 MED ORDER — VITAMIN D (ERGOCALCIFEROL) 1.25 MG (50000 UNIT) PO CAPS: 50000.0000 [IU] | ORAL_CAPSULE | ORAL | 0 refills | Status: DC

## 2022-07-23 NOTE — Progress Notes (Signed)
WEIGHT SUMMARY AND BIOMETRICS  Weight Lost Since Last Visit: 3lb   Vitals Temp: 97.8 F (36.6 C) BP: 119/77 Pulse Rate: 71 SpO2: 100 %   Anthropometric Measurements Height: 5\' 11"  (1.803 m) Weight: 234 lb (106.1 kg) BMI (Calculated): 32.65 Weight at Last Visit: 237lb Weight Lost Since Last Visit: 3lb Starting Weight: 242lb Total Weight Loss (lbs): 8 lb (3.629 kg)   Body Composition  Body Fat %: 29.3 % Fat Mass (lbs): 68.6 lbs Muscle Mass (lbs): 157.2 lbs Total Body Water (lbs): 109 lbs Visceral Fat Rating : 18   Other Clinical Data Fasting: yes Labs: no Today's Visit #: 101 Starting Date: 07/04/20    OBESITY Marvin [name] is here to discuss his progress with his obesity treatment plan along with follow-up of his obesity related diagnoses.    Nutrition Plan: the Category 2 plan - 50% adherence.  Current exercise: none He will increase his exercise.   Interim History:  He is down an additional 3 lbs since his last visit.  Eating all of the food on the plan., Is not skipping meals, Meeting calorie goals., and Water intake is adequate.  Pharmacotherapy: Marvin [name] is on Ozempic 2 mg SQ weekly Adverse side effects: None Hunger is moderately controlled.  Cravings are moderately controlled.  Assessment/Plan:   1. Vitamin D deficiency  Vitamin D is not at goal of 50.  Most recent vitamin D level was 42.4. He is on  prescription ergocalciferol 50,000 IU weekly. Lab Results  Component Value Date   VD25OH 42.4 10/23/2020   VD25OH 7.3 (L) 07/04/2020    Plan: Refill prescription vitamin D 50,000 IU weekly.    2. Type 2 diabetes mellitus with obesity (HCC)  HgbA1c is at goal. Last A1c was 5.9 CBGs: Not checking      Episodes of hypoglycemia: no Medication(s): Ozempic 2 mg SQ weekly  Lab Results  Component Value Date   HGBA1C 5.9 (H) 07/02/2022   HGBA1C 6.1 05/12/2022   HGBA1C 5.7 (H) 11/08/2021   Lab Results  Component Value Date    MICROALBUR <0.7 11/08/2021   LDLCALC 99 11/08/2021   CREATININE 1.08 07/02/2022   Lab Results  Component Value Date   GFR 64.38 05/12/2022   GFR 75.39 11/08/2021   GFR 72.75 05/11/2020    Plan: Continue and refill Ozempic 2 mg SQ weekly Continue all other medications.  Will keep all carbohydrates low both sweets and starches.  Will continue exercise regimen to 30 to 60 minutes on most days of the week.  Aim for 7 to 9 hours of sleep nightly.  Eat more low glycemic index foods.     Generalized Obesity: Current BMI BMI (Calculated): 32.65   Pharmacotherapy Plan Continue and refill  Ozempic 2 mg SQ weekly  Marvin [name] is currently in the action stage of change. As such, his goal is to continue with weight loss efforts.  He has agreed to the Category 2 plan.  Exercise goals: Older adults should determine their level of effort for physical activity relative to their level of fitness.   Behavioral modification strategies: increasing lean protein intake, no meal skipping, meal planning , and planning for success.  Marvin [name] has agreed to follow-up with our clinic in 4 weeks.     Objective:   VITALS: Per patient if applicable, see vitals. GENERAL: Alert and in no acute distress. CARDIOPULMONARY: No increased WOB. Speaking in clear sentences.  PSYCH: Pleasant and cooperative. Speech normal rate and rhythm. Affect is appropriate. Insight and judgement are  appropriate. Attention is focused, linear, and appropriate.  NEURO: Oriented as arrived to appointment on time with no prompting.   Attestation Statements:   This was prepared with the assistance of Engineer, civil (consulting).  Occasional wrong-word or sound-a-like substitutions may have occurred due to the inherent limitations of voice recognition software.   Corinna Capra, DO

## 2022-08-20 ENCOUNTER — Ambulatory Visit (INDEPENDENT_AMBULATORY_CARE_PROVIDER_SITE_OTHER): Payer: 59 | Admitting: Bariatrics

## 2022-08-20 ENCOUNTER — Encounter: Payer: Self-pay | Admitting: Bariatrics

## 2022-08-20 VITALS — BP 126/81 | HR 61 | Temp 97.5°F | Ht 71.0 in | Wt 235.0 lb

## 2022-08-20 DIAGNOSIS — E1169 Type 2 diabetes mellitus with other specified complication: Secondary | ICD-10-CM

## 2022-08-20 DIAGNOSIS — E559 Vitamin D deficiency, unspecified: Secondary | ICD-10-CM | POA: Diagnosis not present

## 2022-08-20 DIAGNOSIS — E669 Obesity, unspecified: Secondary | ICD-10-CM | POA: Diagnosis not present

## 2022-08-20 DIAGNOSIS — Z6832 Body mass index (BMI) 32.0-32.9, adult: Secondary | ICD-10-CM

## 2022-08-20 DIAGNOSIS — Z7984 Long term (current) use of oral hypoglycemic drugs: Secondary | ICD-10-CM

## 2022-08-20 MED ORDER — SEMAGLUTIDE (2 MG/DOSE) 8 MG/3ML ~~LOC~~ SOPN: 2.0000 mg | PEN_INJECTOR | SUBCUTANEOUS | 0 refills | Status: DC

## 2022-08-20 MED ORDER — VITAMIN D (ERGOCALCIFEROL) 1.25 MG (50000 UNIT) PO CAPS: 50000.0000 [IU] | ORAL_CAPSULE | ORAL | 0 refills | Status: DC

## 2022-08-20 NOTE — Progress Notes (Signed)
WEIGHT SUMMARY AND BIOMETRICS  Weight Gained Since Last Visit: 1lb   Vitals Temp: (!) 97.5 F (36.4 C) BP: 126/81 Pulse Rate: 61 SpO2: 100 %   Anthropometric Measurements Height: 5\' 11"  (1.803 m) Weight: 235 lb (106.6 kg) BMI (Calculated): 32.79 Weight at Last Visit: 234lb Weight Gained Since Last Visit: 1lb Starting Weight: 242lb Total Weight Loss (lbs): 7 lb (3.175 kg)   Body Composition  Body Fat %: 29.7 % Fat Mass (lbs): 69.8 lbs Muscle Mass (lbs): 157.2 lbs Total Body Water (lbs): 110.6 lbs Visceral Fat Rating : 18   Other Clinical Data Fasting: no Labs: no Today's Visit #: 30 Starting Date: 07/04/20    OBESITY Willian is here to discuss his progress with his obesity treatment plan along with follow-up of his obesity related diagnoses.     Nutrition Plan: the Category 2 plan - 70% adherence.  Current exercise: walking  Interim History:  He is up 1 lb since his last visit.  Eating all of the food on the plan. and Protein intake is as prescribed  Pharmacotherapy: Bryann is on Ozempic 2 mg SQ weekly Adverse side effects: None Hunger is moderately controlled.  Cravings are moderately controlled.  Assessment/Plan:   1. Vitamin D deficiency Vitamin D Deficiency Vitamin D is not at goal of 50.  Most recent vitamin D level was 42.4. He is on  prescription ergocalciferol 50,000 IU weekly. Lab Results  Component Value Date   VD25OH 42.4 10/23/2020   VD25OH 7.3 (L) 07/04/2020    Plan: Refill prescription vitamin D 50,000 IU weekly.   2. Type 2 diabetes mellitus with obesity (HCC) Type II Diabetes HgbA1c is at goal. Last A1c was 5.9 CBGs: Not checking      Episodes of hypoglycemia: no Medication(s): Metformin 500 mg twice daily with meals  Lab Results  Component Value Date   HGBA1C 5.9 (H) 07/02/2022   HGBA1C 6.1 05/12/2022   HGBA1C 5.7 (H) 11/08/2021   Lab Results  Component Value Date   MICROALBUR <0.7 11/08/2021    LDLCALC 99 11/08/2021   CREATININE 1.08 07/02/2022   Lab Results  Component Value Date   GFR 64.38 05/12/2022   GFR 75.39 11/08/2021   GFR 72.75 05/11/2020    Plan: Continue and refill Ozempic 2 mg SQ weekly Continue all other medications.  Will keep all carbohydrates low both sweets and starches.  Will continue exercise regimen to 30 to 60 minutes on most days of the week.  Aim for 7 to 9 hours of sleep nightly.  Eat more low glycemic index foods.      Generalized Obesity: Current BMI BMI (Calculated): 32.79   Pharmacotherapy Plan Continue and refill  Ozempic 2 mg SQ weekly  Noam is currently in the action stage of change. As such, his goal is to continue with weight loss efforts.  He has agreed to the Category 2 plan.  Exercise goals: Older adults should do exercises that maintain or improve balance if they are at risk of falling.   Behavioral modification strategies: increasing lean protein intake, meal planning , increasing vegetables, and increasing fiber rich foods.  Najib has agreed to follow-up with our clinic in 4 weeks.      Objective:   VITALS: Per patient if applicable, see vitals. GENERAL: Alert and in no acute distress. CARDIOPULMONARY: No increased WOB. Speaking in clear sentences.  PSYCH: Pleasant and cooperative. Speech normal rate and rhythm. Affect is appropriate. Insight and judgement are appropriate. Attention is focused, linear,  and appropriate.  NEURO: Oriented as arrived to appointment on time with no prompting.   Attestation Statements:   This was prepared with the assistance of Engineer, civil (consulting).  Occasional wrong-word or sound-a-like substitutions may have occurred due to the inherent limitations of voice recognition software.   Corinna Capra, DO

## 2022-09-24 ENCOUNTER — Encounter: Payer: Self-pay | Admitting: Bariatrics

## 2022-09-24 ENCOUNTER — Ambulatory Visit (INDEPENDENT_AMBULATORY_CARE_PROVIDER_SITE_OTHER): Payer: 59 | Admitting: Bariatrics

## 2022-09-24 VITALS — BP 122/81 | HR 59 | Temp 98.1°F | Ht 71.0 in | Wt 234.0 lb

## 2022-09-24 DIAGNOSIS — Z6832 Body mass index (BMI) 32.0-32.9, adult: Secondary | ICD-10-CM

## 2022-09-24 DIAGNOSIS — E669 Obesity, unspecified: Secondary | ICD-10-CM | POA: Diagnosis not present

## 2022-09-24 DIAGNOSIS — I10 Essential (primary) hypertension: Secondary | ICD-10-CM

## 2022-09-24 DIAGNOSIS — Z7985 Long-term (current) use of injectable non-insulin antidiabetic drugs: Secondary | ICD-10-CM

## 2022-09-24 DIAGNOSIS — E1169 Type 2 diabetes mellitus with other specified complication: Secondary | ICD-10-CM

## 2022-09-24 MED ORDER — SEMAGLUTIDE (2 MG/DOSE) 8 MG/3ML ~~LOC~~ SOPN
2.0000 mg | PEN_INJECTOR | SUBCUTANEOUS | 0 refills | Status: DC
Start: 2022-09-24 — End: 2022-11-03

## 2022-09-29 NOTE — Progress Notes (Unsigned)
Chief Complaint:   OBESITY Marvin [name] is here to discuss his progress with his obesity treatment plan along with follow-up of his obesity related diagnoses. Marvin [name] is on the Category 2 Plan and states he is following his eating plan approximately 70% of the time. Marvin [name] states he is walking 2 times per week.   Today's visit was #: 31 Starting weight: 242 lbs Starting date: 07/04/2020 Today's weight: 234 lbs Today's date: 09/24/2022 Total lbs lost to date: 8 Total lbs lost since last in-office visit: 1  Interim History: Patient is down 1 lb since his last visit. He is back to work and he is lifting more.   Subjective:   1. Type 2 diabetes mellitus with obesity (HCC) Patient is taking Ozempic.   2. Primary hypertension Patient is taking Norvasc.   Assessment/Plan:   1. Type 2 diabetes mellitus with obesity (HCC) Patient will continue Ozempic 2 mg once weekly, and we will refill for 1 month. He will keep all carbohydrates low (sugar and starches).   - Semaglutide, 2 MG/DOSE, 8 MG/3ML SOPN; Inject 2 mg as directed once a week.  Dispense: 3 mL; Refill: 0  2. Primary hypertension Patient will continue his medications as directed.   3. Generalized obesity  4. BMI 32.0-32.9,adult Marvin [name] is currently in the action stage of change. As such, his goal is to continue with weight loss efforts. He has agreed to the Category 2 Plan.   Patient will adhere closely to the meal plan about 80-90%. He will continue to increase protein, water, and fiber.   Exercise goals: Continue to be active with fishing and golfing.   Behavioral modification strategies: increasing lean protein intake, decreasing simple carbohydrates, increasing vegetables, increasing water intake, decreasing eating out, no skipping meals, meal planning and cooking strategies, keeping healthy foods in the home, and planning for success.  Marvin [name] has agreed to follow-up with our clinic in 4 weeks. He was informed of the  importance of frequent follow-up visits to maximize his success with intensive lifestyle modifications for his multiple health conditions.   Objective:   Blood pressure 122/81, pulse (!) 59, temperature 98.1 F (36.7 C), height 5\' 11"  (1.803 m), weight 234 lb (106.1 kg), SpO2 99%. Body mass index is 32.64 kg/m.  General: Cooperative, alert, well developed, in no acute distress. HEENT: Conjunctivae and lids unremarkable. Cardiovascular: Regular rhythm.  Lungs: Normal work of breathing. Neurologic: No focal deficits.   Lab Results  Component Value Date   CREATININE 1.08 07/02/2022   BUN 16 07/02/2022   NA 138 07/02/2022   K 4.3 07/02/2022   CL 106 07/02/2022   CO2 24 07/02/2022   Lab Results  Component Value Date   ALT 17 05/12/2022   AST 17 05/12/2022   ALKPHOS 74 05/12/2022   BILITOT 0.6 05/12/2022   Lab Results  Component Value Date   HGBA1C 5.9 (H) 07/02/2022   HGBA1C 6.1 05/12/2022   HGBA1C 5.7 (H) 11/08/2021   HGBA1C 5.8 (H) 03/11/2021   HGBA1C 5.9 (H) 10/23/2020   Lab Results  Component Value Date   INSULIN 11.7 10/23/2020   INSULIN 10.6 07/04/2020   Lab Results  Component Value Date   TSH 2.170 07/04/2020   Lab Results  Component Value Date   CHOL 155 11/08/2021   HDL 41.40 11/08/2021   LDLCALC 99 11/08/2021   LDLDIRECT 109.0 05/12/2022   TRIG 74.0 11/08/2021   CHOLHDL 4 11/08/2021   Lab Results  Component Value Date   VD25OH  42.4 10/23/2020   VD25OH 7.3 (L) 07/04/2020   Lab Results  Component Value Date   WBC 8.2 07/02/2022   HGB 14.8 07/02/2022   HCT 44.8 07/02/2022   MCV 83.6 07/02/2022   PLT 215 07/02/2022   No results found for: "IRON", "TIBC", "FERRITIN"  Attestation Statements:   Reviewed by clinician on day of visit: allergies, medications, problem list, medical history, surgical history, family history, social history, and previous encounter notes.   Trude Mcburney, am acting as Energy manager for [employer], DO.  I  have reviewed the above documentation for accuracy and completeness, and I agree with the above. Corinna Capra, DO

## 2022-11-03 ENCOUNTER — Ambulatory Visit (INDEPENDENT_AMBULATORY_CARE_PROVIDER_SITE_OTHER): Payer: 59 | Admitting: Bariatrics

## 2022-11-03 ENCOUNTER — Encounter: Payer: Self-pay | Admitting: Bariatrics

## 2022-11-03 VITALS — BP 129/77 | HR 65 | Temp 97.8°F | Ht 71.0 in | Wt 234.0 lb

## 2022-11-03 DIAGNOSIS — Z6832 Body mass index (BMI) 32.0-32.9, adult: Secondary | ICD-10-CM | POA: Diagnosis not present

## 2022-11-03 DIAGNOSIS — E669 Obesity, unspecified: Secondary | ICD-10-CM | POA: Diagnosis not present

## 2022-11-03 DIAGNOSIS — E559 Vitamin D deficiency, unspecified: Secondary | ICD-10-CM

## 2022-11-03 DIAGNOSIS — Z7985 Long-term (current) use of injectable non-insulin antidiabetic drugs: Secondary | ICD-10-CM

## 2022-11-03 DIAGNOSIS — E1169 Type 2 diabetes mellitus with other specified complication: Secondary | ICD-10-CM | POA: Diagnosis not present

## 2022-11-03 MED ORDER — SEMAGLUTIDE (2 MG/DOSE) 8 MG/3ML ~~LOC~~ SOPN
2.0000 mg | PEN_INJECTOR | SUBCUTANEOUS | 0 refills | Status: DC
Start: 2022-11-03 — End: 2022-12-04

## 2022-11-03 MED ORDER — VITAMIN D (ERGOCALCIFEROL) 1.25 MG (50000 UNIT) PO CAPS: 50000.0000 [IU] | ORAL_CAPSULE | ORAL | 0 refills | Status: DC

## 2022-11-03 NOTE — Progress Notes (Signed)
WEIGHT SUMMARY AND BIOMETRICS  Weight Lost Since Last Visit: 0  Weight Gained Since Last Visit: 0   Vitals Temp: 97.8 F (36.6 C) BP: 129/77 Pulse Rate: 65 SpO2: 99 %   Anthropometric Measurements Height: 5\' 11"  (1.803 m) Weight: 234 lb (106.1 kg) BMI (Calculated): 32.65 Weight at Last Visit: 234lb Weight Lost Since Last Visit: 0 Weight Gained Since Last Visit: 0 Starting Weight: 242lb Total Weight Loss (lbs): 8 lb (3.629 kg)   Body Composition  Body Fat %: 29.8 % Fat Mass (lbs): 70 lbs Muscle Mass (lbs): 156.6 lbs Total Body Water (lbs): 111 lbs Visceral Fat Rating : 18   Other Clinical Data Fasting: no Labs: no Today's Visit #: 32 Starting Date: 07/04/20    OBESITY Marvin [name] is here to discuss his progress with his obesity treatment plan along with follow-up of his obesity related diagnoses.     Nutrition Plan: the Category 2 plan - 70% adherence.  Current exercise: none  Interim History:  Her weight remains the same.  Protein intake is as prescribed, Is not skipping meals, Meeting calorie goals., and Denies polyphagia  Pharmacotherapy: Marvin [name] is on Ozempic 2 mg SQ weekly Adverse side effects: None Hunger is moderately controlled.  Cravings are moderately controlled.  Assessment/Plan:   1. Vitamin D deficiency  Vitamin D is not at goal of 50.  Most recent vitamin D level was 42.4 . He is on  prescription ergocalciferol 50,000 IU weekly. Lab Results  Component Value Date   VD25OH 42.4 10/23/2020   VD25OH 7.3 (L) 07/04/2020    Plan: Refill prescription vitamin D 50,000 IU weekly.   2. Type 2 diabetes mellitus with obesity (HCC) Type II Diabetes HgbA1c is at goal. Last A1c was 5.9 CBGs: Not checking      Episodes of hypoglycemia: no Medication(s): Ozempic 2 mg SQ weekly  Lab Results  Component Value Date   HGBA1C 5.9 (H) 07/02/2022   HGBA1C 6.1 05/12/2022   HGBA1C 5.7 (H) 11/08/2021   Lab Results  Component Value Date    MICROALBUR <0.7 11/08/2021   LDLCALC 99 11/08/2021   CREATININE 1.08 07/02/2022   Lab Results  Component Value Date   GFR 64.38 05/12/2022   GFR 75.39 11/08/2021   GFR 72.75 05/11/2020    Plan: Continue and refill Ozempic 2 mg SQ weekly Will keep all carbohydrates low both sweets and starches. Will choose healthy options.  Will continue exercise regimen to 30 to 60 minutes on most days of the week.  Aim for 7 to 9 hours of sleep nightly.  Eat more low glycemic index foods.       Generalized Obesity: Current BMI BMI (Calculated): 32.65   Pharmacotherapy Plan Continue and refill  Ozempic 2 mg SQ weekly  Marvin [name] is currently in the action stage of change. As such, his goal is to continue with weight loss efforts.  He has agreed to the Category 2 plan.  Exercise goals: Older adults should determine their level of effort for physical activity relative to their level of fitness.   Behavioral modification strategies: no meal skipping, decrease eating out, meal planning , increase water intake, better snacking choices, planning for success, increasing vegetables, avoiding temptations, travel eating strategies, and mindful eating.  Marvin [name] has agreed to follow-up with our clinic in 4 weeks.      Objective:   VITALS: Per patient if applicable, see vitals. GENERAL: Alert and in no acute distress. CARDIOPULMONARY: No increased WOB. Speaking in clear sentences.  PSYCH:  Pleasant and cooperative. Speech normal rate and rhythm. Affect is appropriate. Insight and judgement are appropriate. Attention is focused, linear, and appropriate.  NEURO: Oriented as arrived to appointment on time with no prompting.   Attestation Statements:    This was prepared with the assistance of Engineer, civil (consulting).  Occasional wrong-word or sound-a-like substitutions may have occurred due to the inherent limitations of voice recognition software.   Marvin Capra, DO

## 2022-11-10 ENCOUNTER — Other Ambulatory Visit: Payer: Self-pay | Admitting: Nurse Practitioner

## 2022-11-10 MED ORDER — FLUTICASONE PROPIONATE 50 MCG/ACT NA SUSP: 2.0000 | Freq: Every day | NASAL | 0 refills | Status: DC | PRN

## 2022-11-18 ENCOUNTER — Ambulatory Visit (INDEPENDENT_AMBULATORY_CARE_PROVIDER_SITE_OTHER): Payer: 59 | Admitting: Nurse Practitioner

## 2022-11-18 ENCOUNTER — Encounter: Payer: Self-pay | Admitting: Nurse Practitioner

## 2022-11-18 VITALS — BP 120/60 | HR 67 | Temp 98.0°F | Ht 71.0 in | Wt 238.2 lb

## 2022-11-18 DIAGNOSIS — I10 Essential (primary) hypertension: Secondary | ICD-10-CM

## 2022-11-18 DIAGNOSIS — G8929 Other chronic pain: Secondary | ICD-10-CM

## 2022-11-18 DIAGNOSIS — M545 Low back pain, unspecified: Secondary | ICD-10-CM

## 2022-11-18 DIAGNOSIS — E1169 Type 2 diabetes mellitus with other specified complication: Secondary | ICD-10-CM | POA: Diagnosis not present

## 2022-11-18 DIAGNOSIS — R351 Nocturia: Secondary | ICD-10-CM | POA: Diagnosis not present

## 2022-11-18 DIAGNOSIS — E785 Hyperlipidemia, unspecified: Secondary | ICD-10-CM | POA: Diagnosis not present

## 2022-11-18 DIAGNOSIS — E782 Mixed hyperlipidemia: Secondary | ICD-10-CM | POA: Diagnosis not present

## 2022-11-18 LAB — HEMOGLOBIN A1C: Hgb A1c MFr Bld: 5.9 % (ref 4.6–6.5)

## 2022-11-18 LAB — LIPID PANEL
Cholesterol: 148 mg/dL (ref 0–200)
HDL: 41.7 mg/dL (ref >39.00)
LDL Cholesterol: 89 mg/dL (ref 0–99)
NonHDL: 106.5
Total CHOL/HDL Ratio: 4
Triglycerides: 89 mg/dL (ref 0.0–149.0)
VLDL: 17.8 mg/dL (ref 0.0–40.0)

## 2022-11-18 LAB — RENAL FUNCTION PANEL
Albumin: 4.6 g/dL (ref 3.5–5.2)
BUN: 14 mg/dL (ref 6–23)
CO2: 27 meq/L (ref 19–32)
Calcium: 9.6 mg/dL (ref 8.4–10.5)
Chloride: 105 meq/L (ref 96–112)
Creatinine, Ser: 0.99 mg/dL (ref 0.40–1.50)
GFR: 77.58 mL/min (ref >60.00)
Glucose, Bld: 83 mg/dL (ref 70–99)
Phosphorus: 3.3 mg/dL (ref 2.3–4.6)
Potassium: 4 meq/L (ref 3.5–5.1)
Sodium: 140 meq/L (ref 135–145)

## 2022-11-18 LAB — MICROALBUMIN / CREATININE URINE RATIO
Creatinine,U: 96.8 mg/dL
Microalb Creat Ratio: 0.7 mg/g (ref 0.0–30.0)
Microalb, Ur: <0.7 mg/dL (ref 0.0–1.9)

## 2022-11-18 LAB — PSA: PSA: 0.84 ng/mL (ref 0.10–4.00)

## 2022-11-18 MED ORDER — SIMVASTATIN 20 MG PO TABS: 20.0000 mg | ORAL_TABLET | ORAL | 3 refills | Status: DC

## 2022-11-18 MED ORDER — CYCLOBENZAPRINE HCL 5 MG PO TABS
5.0000 mg | ORAL_TABLET | Freq: Every day | ORAL | 0 refills | Status: DC | PRN
Start: 2022-11-18 — End: 2023-07-21

## 2022-11-18 MED ORDER — FLUTICASONE PROPIONATE 50 MCG/ACT NA SUSP: 2.0000 | Freq: Every day | NASAL | 11 refills | Status: DC | PRN

## 2022-11-18 NOTE — Patient Instructions (Signed)
Go to lab Continue Heart healthy diet and daily exercise. Maintain current medications. 

## 2022-11-18 NOTE — Progress Notes (Signed)
Established Patient Visit  Patient: Marvin [name]   DOB: 12-06-1950   72 y.o. Male  MRN: [id number] Visit Date: 11/18/2022  Subjective:    Chief Complaint  Patient presents with   Follow-up    6 month follow up. No other questions or concerns.   HPI HTN (hypertension) BP at goal with amlodipine BP Readings from Last 3 Encounters:  11/18/22 120/60  11/03/22 129/77  09/24/22 122/81   Maintain med dose Repeat BMP  DM (diabetes mellitus) (HCC) Controlled glucose with ozempic 2mg  weekly. UTD with DIABETES eye exam-report requested Normal foot exam today Collect hgbA1c, UACr and lipid panel today  Hyperlipidemia associated with type 2 diabetes mellitus (HCC) Repeat lipid panel No myalgia with biweekly dose Maintain zocor dose Limited exercise since hernia repair  No gout exacerbation.  Wt Readings from Last 3 Encounters:  11/18/22 238 lb 3.2 oz (108 kg)  11/03/22 234 lb (106.1 kg)  09/24/22 234 lb (106.1 kg)    BP Readings from Last 3 Encounters:  11/18/22 120/60  11/03/22 129/77  09/24/22 122/81    Reviewed medical, surgical, and social history today  Medications: Outpatient Medications Prior to Visit  Medication Sig   allopurinol (ZYLOPRIM) 100 MG tablet Take 1 tablet (100 mg total) by mouth daily.   amLODipine (NORVASC) 10 MG tablet Take 1 tablet (10 mg total) by mouth daily.   aspirin EC 81 MG tablet Take 1 tablet (81 mg total) by mouth daily. Swallow whole.   colchicine 0.6 MG tablet Take 0.6 mg by mouth daily as needed (Gout).   cyclobenzaprine (FLEXERIL) 5 MG tablet Take 1 tablet (5 mg total) by mouth daily as needed for muscle spasms.   Semaglutide, 2 MG/DOSE, 8 MG/3ML SOPN Inject 2 mg as directed once a week.   simvastatin (ZOCOR) 20 MG tablet Take 1 tablet (20 mg total) by mouth 2 (two) times a week.   Vitamin D, Ergocalciferol, (DRISDOL) 1.25 MG (50000 UNIT) CAPS capsule Take 1 capsule (50,000 Units total) by mouth every 7 (seven)  days.   [DISCONTINUED] amoxicillin (AMOXIL) 500 MG tablet Take 2,000 mg by mouth once. (Patient not taking: Reported on 11/18/2022)   [DISCONTINUED] fluticasone (FLONASE) 50 MCG/ACT nasal spray Place 2 sprays into both nostrils daily as needed for allergies or rhinitis.   [DISCONTINUED] predniSONE (STERAPRED UNI-PAK 21 TAB) 10 MG (21) TBPK tablet Take 10 mg by mouth daily as needed (bursitis). (Patient not taking: Reported on 11/18/2022)   No facility-administered medications prior to visit.   Reviewed past medical and social history.   ROS per HPI above      Objective:  BP 120/60   Pulse 67   Temp 98 F (36.7 C) (Temporal)   Ht 5\' 11"  (1.803 m)   Wt 238 lb 3.2 oz (108 kg)   SpO2 98%   BMI 33.22 kg/m      Physical Exam Cardiovascular:     Rate and Rhythm: Normal rate and regular rhythm.     Pulses: Normal pulses.     Heart sounds: Normal heart sounds.  Pulmonary:     Effort: Pulmonary effort is normal.     Breath sounds: Normal breath sounds.  Musculoskeletal:     Right lower leg: No edema.     Left lower leg: No edema.  Neurological:     Mental Status: He is alert and oriented to person, place, and time.  No results found for any visits on 11/18/22.    Assessment & Plan:    Problem List Items Addressed This Visit     DM (diabetes mellitus) (HCC) - Primary    Controlled glucose with ozempic 2mg  weekly. UTD with DIABETES eye exam-report requested Normal foot exam today Collect hgbA1c, UACr and lipid panel today      Relevant Orders   Hemoglobin A1c   Microalbumin / creatinine urine ratio   Renal Function Panel   HTN (hypertension)    BP at goal with amlodipine BP Readings from Last 3 Encounters:  11/18/22 120/60  11/03/22 129/77  09/24/22 122/81   Maintain med dose Repeat BMP      Relevant Orders   Renal Function Panel   Hyperlipidemia associated with type 2 diabetes mellitus (HCC)    Repeat lipid panel No myalgia with biweekly dose Maintain  zocor dose      Relevant Orders   Lipid panel   Other Visit Diagnoses     Nocturia       Relevant Orders   PSA      Return in about 6 months (around 05/19/2023) for HTN, DM, hyperlipidemia (fasting).     Alysia Penna, NP

## 2022-11-18 NOTE — Assessment & Plan Note (Signed)
Repeat lipid panel No myalgia with biweekly dose Maintain zocor dose

## 2022-11-18 NOTE — Assessment & Plan Note (Signed)
BP at goal with amlodipine BP Readings from Last 3 Encounters:  11/18/22 120/60  11/03/22 129/77  09/24/22 122/81   Maintain med dose Repeat BMP

## 2022-11-18 NOTE — Assessment & Plan Note (Signed)
Controlled glucose with ozempic 2mg  weekly. UTD with DIABETES eye exam-report requested Normal foot exam today Collect hgbA1c, UACr and lipid panel today

## 2022-11-20 ENCOUNTER — Encounter: Payer: Self-pay | Admitting: Nurse Practitioner

## 2022-12-04 ENCOUNTER — Ambulatory Visit: Payer: 59 | Admitting: Bariatrics

## 2022-12-04 ENCOUNTER — Encounter: Payer: Self-pay | Admitting: Bariatrics

## 2022-12-04 VITALS — BP 127/76 | HR 62 | Temp 97.7°F | Ht 71.0 in | Wt 235.0 lb

## 2022-12-04 DIAGNOSIS — E1169 Type 2 diabetes mellitus with other specified complication: Secondary | ICD-10-CM | POA: Diagnosis not present

## 2022-12-04 DIAGNOSIS — Z6832 Body mass index (BMI) 32.0-32.9, adult: Secondary | ICD-10-CM

## 2022-12-04 DIAGNOSIS — Z7985 Long-term (current) use of injectable non-insulin antidiabetic drugs: Secondary | ICD-10-CM

## 2022-12-04 DIAGNOSIS — E785 Hyperlipidemia, unspecified: Secondary | ICD-10-CM

## 2022-12-04 DIAGNOSIS — E669 Obesity, unspecified: Secondary | ICD-10-CM

## 2022-12-04 DIAGNOSIS — E6609 Other obesity due to excess calories: Secondary | ICD-10-CM

## 2022-12-04 MED ORDER — SEMAGLUTIDE (2 MG/DOSE) 8 MG/3ML ~~LOC~~ SOPN
2.0000 mg | PEN_INJECTOR | SUBCUTANEOUS | 0 refills | Status: DC
Start: 2022-12-04 — End: 2023-01-01

## 2022-12-04 NOTE — Progress Notes (Signed)
WEIGHT SUMMARY AND BIOMETRICS  Weight Lost Since Last Visit: 0  Weight Gained Since Last Visit: 1lb   Vitals Temp: 97.7 F (36.5 C) BP: 127/76 Pulse Rate: 62 SpO2: 98 %   Anthropometric Measurements Height: 5\' 11"  (1.803 m) Weight: 235 lb (106.6 kg) BMI (Calculated): 32.79 Weight at Last Visit: 234lb Weight Lost Since Last Visit: 0 Weight Gained Since Last Visit: 1lb Starting Weight: 242lb Total Weight Loss (lbs): 7 lb (3.175 kg)   Body Composition  Body Fat %: 30.3 % Fat Mass (lbs): 71.4 lbs Muscle Mass (lbs): 156.2 lbs Total Body Water (lbs): 46.7 lbs Visceral Fat Rating : 19   Other Clinical Data Fasting: no Labs: no Today's Visit #: 66 Starting Date: 07/04/20    OBESITY Jancarlo is here to discuss his progress with his obesity treatment plan along with follow-up of his obesity related diagnoses.     Nutrition Plan: the Category 2 plan - 70% adherence.  Current exercise: walking at least 9,000-10,000 steps daily.  Interim History:  She is up 1 lb since his last visit. He has been on vacation.  Eating all of the food on the plan., Meeting protein goals., and Denies excessive cravings.  Pharmacotherapy: Blanche is on Ozempic 2 mg SQ weekly Adverse side effects: None Hunger is moderately controlled.  Cravings are moderately controlled.  Assessment/Plan:   1. Type 2 diabetes mellitus with obesity (HCC) Type II Diabetes HgbA1c is at goal. Last A1c was 5.9  Episodes of hypoglycemia: no Medication(s): Ozempic 2 mg SQ weekly  Lab Results  Component Value Date   HGBA1C 5.9 11/18/2022   HGBA1C 5.9 (H) 07/02/2022   HGBA1C 6.1 05/12/2022   Lab Results  Component Value Date   MICROALBUR <0.7 11/18/2022   LDLCALC 89 11/18/2022   CREATININE 0.99 11/18/2022   Lab Results  Component Value Date   GFR 77.58 11/18/2022   GFR 64.38 05/12/2022   GFR 75.39 11/08/2021    Plan: Continue and refill Ozempic 2 mg SQ weekly Continue all other  medications.  Will keep all carbohydrates low both sweets and starches.  Will continue exercise regimen to 30 to 60 minutes on most days of the week.  Aim for 7 to 9 hours of sleep nightly.  Eat more low glycemic index foods.   Hyperlipidemia LDL is not at goal. Medication(s): Zocor Cardiovascular risk factors: advanced age (older than 61 for men, 16 for women), diabetes mellitus, dyslipidemia, male gender, obesity (BMI >= 30 kg/m2), and sedentary lifestyle  Lab Results  Component Value Date   CHOL 148 11/18/2022   HDL 41.70 11/18/2022   LDLCALC 89 11/18/2022   LDLDIRECT 109.0 05/12/2022   TRIG 89.0 11/18/2022   CHOLHDL 4 11/18/2022   Lab Results  Component Value Date   ALT 17 05/12/2022   AST 17 05/12/2022   ALKPHOS 74 05/12/2022   BILITOT 0.6 05/12/2022   The 10-year ASCVD risk score (Arnett DK, et al., 2019) is: 30.8%   Values used to calculate the score:     Age: 26 years     Sex: Male     Is Non-Hispanic African American: Yes     Diabetic: Yes     Tobacco smoker: No     Systolic Blood Pressure: 127 mmHg     Is BP treated: Yes     HDL Cholesterol: 41.7 mg/dL     Total Cholesterol: 148 mg/dL  Plan:  Continue statin.  Information sheet on healthy vs unhealthy fats.  Will avoid  all trans fats.  Will read labels Will minimize saturated fats except the following: low fat meats in moderation, diary, and limited dark chocolate.  He will keep his carbohydrates low.  Will keep his exercise high.     Generalized Obesity: Current BMI BMI (Calculated): 32.79   Pharmacotherapy Plan Continue and refill  Ozempic 2 mg SQ weekly  Oak is currently in the action stage of change. As such, his goal is to continue with weight loss efforts.  He has agreed to the Category 2 plan. He will keep his protein high.   Exercise goals: Older adults should determine their level of effort for physical activity relative to their level of fitness.  He walks more on the weekends up to  20,000 steps or more.   Behavioral modification strategies: increasing lean protein intake, no meal skipping, meal planning , increase water intake, better snacking choices, planning for success, and mindful eating.  Gearold has agreed to follow-up with our clinic in 4 weeks.      Objective:   VITALS: Per patient if applicable, see vitals. GENERAL: Alert and in no acute distress. CARDIOPULMONARY: No increased WOB. Speaking in clear sentences.  PSYCH: Pleasant and cooperative. Speech normal rate and rhythm. Affect is appropriate. Insight and judgement are appropriate. Attention is focused, linear, and appropriate.  NEURO: Oriented as arrived to appointment on time with no prompting.   Attestation Statements:   This was prepared with the assistance of Engineer, civil (consulting).  Occasional wrong-word or sound-a-like substitutions may have occurred due to the inherent limitations of voice recognition software.   Corinna Capra, DO

## 2022-12-05 ENCOUNTER — Ambulatory Visit (INDEPENDENT_AMBULATORY_CARE_PROVIDER_SITE_OTHER): Payer: 59

## 2022-12-05 DIAGNOSIS — Z0000 Encounter for general adult medical examination without abnormal findings: Secondary | ICD-10-CM | POA: Diagnosis not present

## 2022-12-05 NOTE — Patient Instructions (Signed)
Marvin [name] , Thank you for taking time to come for your Medicare Wellness Visit. I appreciate your ongoing commitment to your health goals. Please review the following plan we discussed and let me know if I can assist you in the future.   Referrals/Orders/Follow-Ups/Clinician Recommendations: none  This is a list of the screening recommended for you and due dates:  Health Maintenance  Topic Date Due   COVID-19 Vaccine (6 - 2023-24 season) 12/26/2022   Eye exam for diabetics  02/21/2023   Hemoglobin A1C  05/19/2023   Yearly kidney function blood test for diabetes  11/18/2023   Yearly kidney health urinalysis for diabetes  11/18/2023   Complete foot exam   11/18/2023   Medicare Annual Wellness Visit  12/05/2023   Colon Cancer Screening  07/29/2027   DTaP/Tdap/Td vaccine (2 - Td or Tdap) 08/25/2027   Pneumonia Vaccine  Completed   Flu Shot  Completed   Hepatitis C Screening  Completed   Zoster (Shingles) Vaccine  Completed   HPV Vaccine  Aged Out    Advanced directives: (ACP Link)Information on Advanced Care Planning can be found at [facility] [place] Advance Health Care Directives Advance Health Care Directives ([email or web address]   Next Medicare Annual Wellness Visit scheduled for next year: Yes  insert Preventive Care attachment Insert FALL PREVENTION attachment if needed

## 2022-12-05 NOTE — Progress Notes (Signed)
Subjective:   Marvin [name] is a 72 y.o. male who presents for Medicare Annual/Subsequent preventive examination.  Visit Complete: Virtual I connected with  [name] on 12/05/22 by a audio enabled telemedicine application and verified that I am speaking with the correct person using two identifiers.  Patient Location: Home  Provider Location: Office/Clinic  I discussed the limitations of evaluation and management by telemedicine. The patient expressed understanding and agreed to proceed.  Vital Signs: Because this visit was a virtual/telehealth visit, some criteria may be missing or patient reported. Any vitals not documented were not able to be obtained and vitals that have been documented are patient reported.  Patient Medicare AWV questionnaire was completed by the patient on 12/01/2022; I have confirmed that all information answered by patient is correct and no changes since this date.  Cardiac Risk Factors include: advanced age (>77men, >70 women);diabetes mellitus;dyslipidemia;hypertension;male gender     Objective:    Today's Vitals   There is no height or weight on file to calculate BMI.     12/05/2022   10:00 AM 07/15/2022    7:30 AM 07/02/2022    9:55 AM 11/27/2021   10:01 AM 09/18/2021    1:41 PM 03/11/2021    7:55 AM 11/08/2020    3:21 PM  Advanced Directives  Does Patient Have a Medical Advance Directive? No No No No No Yes No  Type of Careers adviser;Living will   Would patient like information on creating a medical advance directive?  No - Patient declined  No - Patient declined No - Patient declined  Yes (MAU/Ambulatory/Procedural Areas - Information given)    Current Medications (verified) Outpatient Encounter Medications as of 12/05/2022  Medication Sig   allopurinol (ZYLOPRIM) 100 MG tablet Take 1 tablet (100 mg total) by mouth daily.   amLODipine (NORVASC) 10 MG tablet Take 1 tablet (10 mg total) by mouth  daily.   aspirin EC 81 MG tablet Take 1 tablet (81 mg total) by mouth daily. Swallow whole.   colchicine 0.6 MG tablet Take 0.6 mg by mouth daily as needed (Gout).   cyclobenzaprine (FLEXERIL) 5 MG tablet Take 1 tablet (5 mg total) by mouth daily as needed for muscle spasms.   fluticasone (FLONASE) 50 MCG/ACT nasal spray Place 2 sprays into both nostrils daily as needed for allergies or rhinitis.   Semaglutide, 2 MG/DOSE, 8 MG/3ML SOPN Inject 2 mg as directed once a week.   simvastatin (ZOCOR) 20 MG tablet Take 1 tablet (20 mg total) by mouth 2 (two) times a week. (Patient taking differently: Take 20 mg by mouth once a week.)   Vitamin D, Ergocalciferol, (DRISDOL) 1.25 MG (50000 UNIT) CAPS capsule Take 1 capsule (50,000 Units total) by mouth every 7 (seven) days.   No facility-administered encounter medications on file as of 12/05/2022.    Allergies (verified) Buprenorphine hcl and Morphine and codeine   History: Past Medical History:  Diagnosis Date   Alcohol abuse    Allergy    Arthritis    Back pain    Constipation 03/15/2020   Drug abuse (HCC)    Gout    Gout 05/20/2016   Hernia    Hypercholesteremia    Hypertension    Joint pain    Obesity    Osteoarthritis    Pre-diabetes    Prediabetes    Stomach ulcer    Swallowing difficulty    Wears dentures  bottom front   Past Surgical History:  Procedure Laterality Date   HERNIA REPAIR     twice   INGUINAL HERNIA REPAIR Left 07/15/2022   Procedure: OPEN LEFT INGUINAL HERNIA REPAIR;  Surgeon: Luretha Murphy, MD;  Location: [facility];  Service: General;  Laterality: Left;   left inguinal hernia repair      left shoulder rotator cuff repair      REPLACEMENT TOTAL KNEE     partial   right knee replacement      SHOULDER SURGERY     right   TOTAL HIP ARTHROPLASTY Right 10/03/2019   Procedure: RIGHT TOTAL HIP ARTHROPLASTY ANTERIOR APPROACH;  Surgeon: Tarry Kos, MD;  Location: [facility];  Service: Orthopedics;  Laterality:  Right;   UMBILICAL HERNIA REPAIR     x 2   Family History  Problem Relation Age of Onset   Healthy Mother    Healthy Father    Cancer Brother        bone cancer   Cancer Maternal Aunt        breast cancer   Cancer Maternal Uncle        lukemia   Cancer Cousin        unknown   Cancer Maternal Uncle        lukemia   Cancer Paternal Uncle        prostate   Hearing loss Maternal Grandfather 77   Social History   Socioeconomic History   Marital status: Married    Spouse name: Not on file   Number of children: Not on file   Years of education: Not on file   Highest education level: Associate degree: academic program  Occupational History   Not on file  Tobacco Use   Smoking status: Former    Current packs/day: 0.00    Types: Cigarettes    Quit date: 03/20/1998    Years since quitting: 24.7   Smokeless tobacco: Former    Types: Associate Professor status: Never Used  Substance and Sexual Activity   Alcohol use: Not Currently    Comment: hx of 23 years ago   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  Other Topics Concern   Not on file  Social History Narrative   Not on file   Social Determinants of Health   Financial Resource Strain: Low Risk  (12/01/2022)   Overall Financial Resource Strain (CARDIA)    Difficulty of Paying Living Expenses: Not hard at all  Food Insecurity: No Food Insecurity (12/01/2022)   Hunger Vital Sign    Worried About Running Out of Food in the Last Year: Never true    Ran Out of Food in the Last Year: Never true  Transportation Needs: No Transportation Needs (12/01/2022)   PRAPARE - Administrator, Civil Service (Medical): No    Lack of Transportation (Non-Medical): No  Physical Activity: Sufficiently Active (12/01/2022)   Exercise Vital Sign    Days of Exercise per Week: 5 days    Minutes of Exercise per Session: 120 min  Stress: No  Stress Concern Present (12/01/2022)   [employer] of Occupational Health - Occupational Stress Questionnaire    Feeling of Stress : Not at all  Social Connections: Socially Integrated (12/01/2022)   Social Connection and Isolation Panel [NHANES]    Frequency of Communication with Friends and Family:  Three times a week    Frequency of Social Gatherings with Friends and Family: Once a week    Attends Religious Services: More than 4 times per year    Active Member of [employer] or Organizations: Yes    Attends Banker Meetings: 1 to 4 times per year    Marital Status: Married    Tobacco Counseling Counseling given: Not Answered   Clinical Intake:  Pre-visit preparation completed: Yes  Pain : No/denies pain     Nutritional Risks: None Diabetes: Yes CBG done?: No Did pt. bring in CBG monitor from home?: No  How often do you need to have someone help you when you read instructions, pamphlets, or other written materials from your doctor or pharmacy?: 1 - Never  Interpreter Needed?: No  Information entered by :: NAllen LPN   Activities of Daily Living    12/01/2022   11:12 AM 07/02/2022    9:56 AM  In your present state of health, do you have any difficulty performing the following activities:  Hearing? 0   Vision? 0   Difficulty concentrating or making decisions? 0   Walking or climbing stairs? 0   Dressing or bathing? 0   Doing errands, shopping? 0 0  Preparing Food and eating ? N   Using the Toilet? N   In the past six months, have you accidently leaked urine? N   Do you have problems with loss of bowel control? N   Managing your Medications? N   Managing your Finances? N   Housekeeping or managing your Housekeeping? N     Patient Care Team: Nche, Bonna Gains, NP as PCP - [facility] (Internal [facility], [facility]  Indicate any recent Medical Services you may have received from other than [facility] providers in the past  year (date may be approximate).     Assessment:   This is a routine wellness examination for [place].  Hearing/Vision screen Hearing Screening - Comments:: Denies hearing issues Vision Screening - Comments:: Regular eye exams, Dr. Riley Kill, [facility]   Goals Addressed             This Visit's Progress    Patient Stated       12/05/2022, start playing golf regularly       Depression Screen    12/05/2022   10:01 AM 05/12/2022   10:06 AM 11/27/2021    9:59 AM 05/13/2021    8:06 AM 11/08/2020   11:05 AM 09/18/2020    9:56 AM 07/04/2020    7:59 AM  PHQ 2/9 Scores  PHQ - 2 Score 0 0 0 0 0 0 1  PHQ- 9 Score 0    1  3    Fall Risk    12/01/2022   11:12 AM 05/12/2022   10:06 AM 11/27/2021    9:58 AM 11/24/2021    4:40 PM 11/08/2021    9:07 AM  Fall Risk   Falls in the past year? 0 0 0 0 0  Number falls in past yr: 0 0 0  0  Injury with Fall? 0 0 0 0 0  Risk for fall due to : Medication side effect No Fall Risks No Fall Risks    Follow up Falls prevention discussed;Falls evaluation completed Falls evaluation completed Falls prevention discussed      MEDICARE RISK AT HOME: Medicare Risk at Home Any stairs in or around the home?: Yes If so, are there any without handrails?: Yes Home free  of loose throw rugs in walkways, pet beds, electrical cords, etc?: No Adequate lighting in your home to reduce risk of falls?: Yes Life alert?: No Use of a cane, walker or w/c?: No Grab bars in the bathroom?: Yes Shower chair or bench in shower?: No Elevated toilet seat or a handicapped toilet?: No  TIMED UP AND GO:  Was the test performed?  No    Cognitive Function:        12/05/2022   10:02 AM 11/27/2021   10:02 AM 08/10/2019   10:44 AM  6CIT Screen  What Year? 0 points 0 points 0 points  What month? 0 points 0 points 0 points  What time? 0 points 0 points 0 points  Count back from 20 0 points 0 points 0 points  Months in reverse 0 points 0 points 0 points  Repeat phrase 2  points 0 points 0 points  Total Score 2 points 0 points 0 points    Immunizations Immunization History  Administered Date(s) Administered   Fluad Quad(high Dose 65+) 11/11/2019   Influenza, High Dose Seasonal PF 12/28/2016, 10/14/2018, 10/12/2021, 10/30/2022   Influenza,inj,Quad PF,6+ Mos 11/27/2016   Influenza-Unspecified 11/12/2015, 11/17/2017, 10/29/2020   Moderna SARS-COV2 Booster Vaccination 01/04/2020, 06/30/2020   Moderna Sars-Covid-2 Vaccination 04/01/2019, 04/29/2019   Pneumococcal Conjugate-13 02/24/2018   Pneumococcal Polysaccharide-23 10/14/2018   Tdap 08/24/2017   Unspecified SARS-COV-2 Vaccination 10/31/2022   Zoster Recombinant(Shingrix) 05/13/2022, 07/24/2022   Zoster, Live 12/06/2013    TDAP status: Up to date  Flu Vaccine status: Up to date  Pneumococcal vaccine status: Up to date  Covid-19 vaccine status: Completed vaccines  Qualifies for Shingles Vaccine? Yes   Zostavax completed Yes   Shingrix Completed?: Yes  Screening Tests Health Maintenance  Topic Date Due   COVID-19 Vaccine (6 - 2023-24 season) 12/26/2022   OPHTHALMOLOGY EXAM  02/21/2023   HEMOGLOBIN A1C  05/19/2023   Diabetic kidney evaluation - eGFR measurement  11/18/2023   Diabetic kidney evaluation - Urine ACR  11/18/2023   FOOT EXAM  11/18/2023   Medicare Annual Wellness (AWV)  12/05/2023   Colonoscopy  07/29/2027   DTaP/Tdap/Td (2 - Td or Tdap) 08/25/2027   Pneumonia Vaccine 38+ Years old  Completed   INFLUENZA VACCINE  Completed   Hepatitis C Screening  Completed   Zoster Vaccines- Shingrix  Completed   HPV VACCINES  Aged Out    Health Maintenance  There are no preventive care reminders to display for this patient.   Colorectal cancer screening: Type of screening: Colonoscopy. Completed 07/28/2017. Repeat every 10 years  Lung Cancer Screening: (Low Dose CT Chest recommended if Age 34-80 years, 20 pack-year currently smoking OR have quit w/in 15years.) does not qualify.    Lung Cancer Screening Referral: no  Additional Screening:  Hepatitis C Screening: does qualify; Completed 08/24/2017  Vision Screening: Recommended annual ophthalmology exams for early detection of glaucoma and other disorders of the eye. Is the patient up to date with their annual eye exam?  Yes  Who is the provider or what is the name of the office in which the patient attends annual eye exams? [facility] If pt is not established with a provider, would they like to be referred to a provider to establish care? No .   Dental Screening: Recommended annual dental exams for proper oral hygiene  Diabetic Foot Exam: Diabetic Foot Exam: Completed 11/18/2022  Community Resource Referral / Chronic Care Management: CRR required this visit?  No   CCM required this visit?  No     Plan:     I have personally reviewed and noted the following in the patient's chart:   Medical and social history Use of alcohol, tobacco or illicit drugs  Current medications and supplements including opioid prescriptions. Patient is not currently taking opioid prescriptions. Functional ability and status Nutritional status Physical activity Advanced directives List of other physicians Hospitalizations, surgeries, and ER visits in previous 12 months Vitals Screenings to include cognitive, depression, and falls Referrals and appointments  In addition, I have reviewed and discussed with patient certain preventive protocols, quality metrics, and best practice recommendations. A written personalized care plan for preventive services as well as general preventive health recommendations were provided to patient.     Barb Merino, LPN   85/27/[id number]   After Visit Summary: (MyChart) Due to this being a telephonic visit, the after visit summary with patients personalized plan was offered to patient via MyChart   Nurse Notes: none

## 2022-12-16 DIAGNOSIS — M109 Gout, unspecified: Secondary | ICD-10-CM | POA: Diagnosis not present

## 2022-12-16 DIAGNOSIS — Z87891 Personal history of nicotine dependence: Secondary | ICD-10-CM | POA: Diagnosis not present

## 2022-12-16 DIAGNOSIS — M1990 Unspecified osteoarthritis, unspecified site: Secondary | ICD-10-CM | POA: Diagnosis not present

## 2022-12-16 DIAGNOSIS — Z8249 Family history of ischemic heart disease and other diseases of the circulatory system: Secondary | ICD-10-CM | POA: Diagnosis not present

## 2022-12-16 DIAGNOSIS — Z79899 Other long term (current) drug therapy: Secondary | ICD-10-CM | POA: Diagnosis not present

## 2022-12-16 DIAGNOSIS — Z008 Encounter for other general examination: Secondary | ICD-10-CM | POA: Diagnosis not present

## 2022-12-16 DIAGNOSIS — E669 Obesity, unspecified: Secondary | ICD-10-CM | POA: Diagnosis not present

## 2022-12-16 DIAGNOSIS — F1021 Alcohol dependence, in remission: Secondary | ICD-10-CM | POA: Diagnosis not present

## 2022-12-16 DIAGNOSIS — E559 Vitamin D deficiency, unspecified: Secondary | ICD-10-CM | POA: Diagnosis not present

## 2022-12-16 DIAGNOSIS — J309 Allergic rhinitis, unspecified: Secondary | ICD-10-CM | POA: Diagnosis not present

## 2022-12-16 DIAGNOSIS — E1122 Type 2 diabetes mellitus with diabetic chronic kidney disease: Secondary | ICD-10-CM | POA: Diagnosis not present

## 2022-12-16 DIAGNOSIS — I129 Hypertensive chronic kidney disease with stage 1 through stage 4 chronic kidney disease, or unspecified chronic kidney disease: Secondary | ICD-10-CM | POA: Diagnosis not present

## 2022-12-16 DIAGNOSIS — E785 Hyperlipidemia, unspecified: Secondary | ICD-10-CM | POA: Diagnosis not present

## 2022-12-16 LAB — HM DIABETES EYE EXAM

## 2023-01-01 ENCOUNTER — Ambulatory Visit: Payer: 59 | Admitting: Bariatrics

## 2023-01-01 ENCOUNTER — Encounter: Payer: Self-pay | Admitting: Bariatrics

## 2023-01-01 VITALS — BP 136/78 | HR 68 | Temp 97.9°F | Ht 71.0 in | Wt 236.0 lb

## 2023-01-01 DIAGNOSIS — E669 Obesity, unspecified: Secondary | ICD-10-CM

## 2023-01-01 DIAGNOSIS — E1169 Type 2 diabetes mellitus with other specified complication: Secondary | ICD-10-CM | POA: Diagnosis not present

## 2023-01-01 DIAGNOSIS — Z7985 Long-term (current) use of injectable non-insulin antidiabetic drugs: Secondary | ICD-10-CM

## 2023-01-01 DIAGNOSIS — E559 Vitamin D deficiency, unspecified: Secondary | ICD-10-CM

## 2023-01-01 DIAGNOSIS — Z6832 Body mass index (BMI) 32.0-32.9, adult: Secondary | ICD-10-CM | POA: Diagnosis not present

## 2023-01-01 DIAGNOSIS — E66811 Obesity, class 1: Secondary | ICD-10-CM

## 2023-01-01 MED ORDER — VITAMIN D (ERGOCALCIFEROL) 1.25 MG (50000 UNIT) PO CAPS
50000.0000 [IU] | ORAL_CAPSULE | ORAL | 0 refills | Status: DC
Start: 2023-01-01 — End: 2023-04-02

## 2023-01-01 MED ORDER — SEMAGLUTIDE (2 MG/DOSE) 8 MG/3ML ~~LOC~~ SOPN: 2.0000 mg | PEN_INJECTOR | SUBCUTANEOUS | 0 refills | Status: DC

## 2023-01-01 NOTE — Progress Notes (Signed)
WEIGHT SUMMARY AND BIOMETRICS  Weight Lost Since Last Visit: 0  Weight Gained Since Last Visit: 1lb   Vitals Temp: 97.9 F (36.6 C) BP: 136/78 Pulse Rate: 68 SpO2: 99 %   Anthropometric Measurements Height: 5\' 11"  (1.803 m) Weight: 236 lb (107 kg) BMI (Calculated): 32.93 Weight at Last Visit: 235lb Weight Lost Since Last Visit: 0 Weight Gained Since Last Visit: 1lb Starting Weight: 242lb Total Weight Loss (lbs): 6 lb (2.722 kg)   Body Composition  Body Fat %: 30.5 % Fat Mass (lbs): 72.2 lbs Muscle Mass (lbs): 156.4 lbs Total Body Water (lbs): 114.2 lbs Visceral Fat Rating : 19   Other Clinical Data Fasting: no Labs: no Today's Visit #: 34 Starting Date: 07/04/20    OBESITY Marvin [name] is here to discuss his progress with his obesity treatment plan along with follow-up of his obesity related diagnoses.    Nutrition Plan: the Category 2 plan - 70% adherence.  Current exercise: walking and fishing.  Interim History:  He has gained 1 lb since his last visit.  Eating all of the food on the plan., Is skipping meals, and Water intake is adequate.   Pharmacotherapy: Marvin [name] is on Ozempic 2 mg SQ weekly Adverse side effects: None Hunger is moderately controlled.  Cravings are moderately controlled.  Assessment/Plan:   1. Vitamin D deficiency Vitamin D Deficiency Vitamin D is not at goal of 50.  Most recent vitamin D level was 42.4. He is on  prescription ergocalciferol 50,000 IU weekly. Lab Results  Component Value Date   VD25OH 42.4 10/23/2020   VD25OH 7.3 (L) 07/04/2020    Plan: Refill prescription vitamin D 50,000 IU weekly.   2. Type 2 diabetes mellitus with obesity (HCC) Type II Diabetes HgbA1c is at goal. Last A1c was 5.9 CBGs: Not checking      Episodes of hypoglycemia: no Medication(s): Ozempic 2 mg SQ weekly  Lab Results   Component Value Date   HGBA1C 5.9 11/18/2022   HGBA1C 5.9 (H) 07/02/2022   HGBA1C 6.1 05/12/2022   Lab Results  Component Value Date   MICROALBUR <0.7 11/18/2022   LDLCALC 89 11/18/2022   CREATININE 0.99 11/18/2022   Lab Results  Component Value Date   GFR 77.58 11/18/2022   GFR 64.38 05/12/2022   GFR 75.39 11/08/2021    Plan: Continue and refill Ozempic 2 mg SQ weekly Continue all other medications.  Will keep all carbohydrates low both sweets and starches.  Will continue exercise regimen to 30 to 60 minutes on most days of the week.  Aim for 7 to 9 hours of sleep nightly.  Eat more low glycemic index foods.     Generalized Obesity: Current BMI BMI (Calculated): 32.93   Pharmacotherapy Plan Continue and refill  Ozempic 2 mg SQ weekly  Marvin [name] is currently in the action stage of change. As such, his goal  is to continue with weight loss efforts.  He has agreed to the Category 2 plan.  Exercise goals: Older adults should do exercises that maintain or improve balance if they are at risk of falling.   Behavioral modification strategies: increasing lean protein intake, no meal skipping, meal planning , increase water intake, better snacking choices, planning for success, increasing vegetables, decrease snacking , avoiding temptations, keep healthy foods in the home, holiday eating strategies , and mindful eating.  Marvin [name] has agreed to follow-up with our clinic in 4 weeks.      Objective:   VITALS: Per patient if applicable, see vitals. GENERAL: Alert and in no acute distress. CARDIOPULMONARY: No increased WOB. Speaking in clear sentences.  PSYCH: Pleasant and cooperative. Speech normal rate and rhythm. Affect is appropriate. Insight and judgement are appropriate. Attention is focused, linear, and appropriate.  NEURO: Oriented as arrived to appointment on time with no prompting.   Attestation Statements:    This was prepared with the assistance of Restaurant manager, fast food.  Occasional wrong-word or sound-a-like substitutions may have occurred due to the inherent limitations of voice recognition   Corinna Capra, DO

## 2023-01-29 ENCOUNTER — Encounter: Payer: Self-pay | Admitting: Bariatrics

## 2023-01-29 ENCOUNTER — Ambulatory Visit: Payer: 59 | Admitting: Bariatrics

## 2023-01-29 VITALS — BP 124/77 | HR 68 | Ht 71.0 in | Wt 233.0 lb

## 2023-01-29 DIAGNOSIS — Z7985 Long-term (current) use of injectable non-insulin antidiabetic drugs: Secondary | ICD-10-CM

## 2023-01-29 DIAGNOSIS — Z6832 Body mass index (BMI) 32.0-32.9, adult: Secondary | ICD-10-CM

## 2023-01-29 DIAGNOSIS — E669 Obesity, unspecified: Secondary | ICD-10-CM

## 2023-01-29 DIAGNOSIS — E1169 Type 2 diabetes mellitus with other specified complication: Secondary | ICD-10-CM | POA: Diagnosis not present

## 2023-01-29 DIAGNOSIS — I10 Essential (primary) hypertension: Secondary | ICD-10-CM

## 2023-01-29 DIAGNOSIS — E6609 Other obesity due to excess calories: Secondary | ICD-10-CM

## 2023-01-29 MED ORDER — SEMAGLUTIDE (2 MG/DOSE) 8 MG/3ML ~~LOC~~ SOPN: 2.0000 mg | PEN_INJECTOR | SUBCUTANEOUS | 0 refills | Status: DC

## 2023-01-29 NOTE — Progress Notes (Signed)
WEIGHT SUMMARY AND BIOMETRICS  Weight Lost Since Last Visit: 3lb  Weight Gained Since Last Visit: 0   Vitals BP: 124/77 Pulse Rate: 68 SpO2: 100 %   Anthropometric Measurements Height: 5\' 11"  (1.803 m) Weight: 233 lb (105.7 kg) BMI (Calculated): 32.51 Weight at Last Visit: 236lb Weight Lost Since Last Visit: 3lb Weight Gained Since Last Visit: 0 Starting Weight: 242lb Total Weight Loss (lbs): 9 lb (4.082 kg)   Body Composition  Body Fat %: 29.6 % Fat Mass (lbs): 69 lbs Muscle Mass (lbs): 156.4 lbs Total Body Water (lbs): 112.2 lbs Visceral Fat Rating : 18   Other Clinical Data Fasting: no Labs: no Today's Visit #: 35 Starting Date: 07/04/20    OBESITY Marvin [name] is here to discuss his progress with his obesity treatment plan along with follow-up of his obesity related diagnoses.    Nutrition Plan: the Category 2 plan - 70% adherence.  Current exercise: walking and fishing.  Interim History:  He lost 3 lbs since his last visit.  Eating all of the food on the plan., Protein intake is as prescribed, Is not skipping meals, and Denies polyphagia   Pharmacotherapy: Marvin [name] is on Ozempic 2 mg SQ weekly Adverse side effects: None Hunger is moderately controlled.  Cravings are moderately controlled.   Assessment/Plan:   1. Type 2 diabetes mellitus with obesity (HCC)  HgbA1c is at goal. Last A1c was 5.9 CBGs: Not checking      Episodes of hypoglycemia: no Medication(s): Ozempic 2 mg SQ weekly  Lab Results  Component Value Date   HGBA1C 5.9 11/18/2022   HGBA1C 5.9 (H) 07/02/2022   HGBA1C 6.1 05/12/2022   Lab Results  Component Value Date   MICROALBUR <0.7 11/18/2022   LDLCALC 89 11/18/2022   CREATININE 0.99 11/18/2022   Lab Results  Component Value Date   GFR 77.58 11/18/2022   GFR 64.38 05/12/2022   GFR 75.39 11/08/2021     Plan: Continue and refill Ozempic 2 mg SQ weekly Continue all other medications.  Will keep all carbohydrates low both sweets and starches.  Will continue exercise regimen to 30 to 60 minutes on most days of the week.  Aim for 7 to 9 hours of sleep nightly.  Eat more low glycemic index foods.     Hypertension Hypertension stable.  Medication(s): Amlodipine 10 mg 1 daily   BP Readings from Last 3 Encounters:  01/29/23 124/77  01/01/23 136/78  12/04/22 127/76   Lab Results  Component Value Date   CREATININE 0.99 11/18/2022   CREATININE 1.08 07/02/2022   CREATININE 1.16 05/12/2022   Lab Results  Component Value Date   GFR 77.58 11/18/2022   GFR 64.38 05/12/2022   GFR 75.39 11/08/2021    Plan: Continue all antihypertensives at current dosages. No added salt. Will keep sodium content to 1,500 mg or less per day.  Generalized Obesity: Current BMI BMI (Calculated): 32.51   Pharmacotherapy Plan Continue and refill  Ozempic 2 mg SQ weekly 60 day supply with no refills.   Marvin [name] is currently in the action stage of change. As such, his goal is to continue with weight loss efforts.  He has agreed to the Category 2 plan.  Exercise goals: All adults should avoid inactivity. Some physical activity is better than none, and adults who participate in any amount of physical activity gain some health benefits.  Behavioral modification strategies: increasing lean protein intake, decreasing simple carbohydrates , no meal skipping, decrease eating out, meal planning , increase water intake, better snacking choices, planning for success, increasing vegetables, get rid of junk food in the home, decrease snacking , avoiding temptations, keep healthy foods in the home, and mindful eating.  Marvin [name] has agreed to follow-up with our clinic in 4 weeks.      Objective:   VITALS: Per patient if applicable, see vitals. GENERAL: Alert and in no acute distress. CARDIOPULMONARY: No  increased WOB. Speaking in clear sentences.  PSYCH: Pleasant and cooperative. Speech normal rate and rhythm. Affect is appropriate. Insight and judgement are appropriate. Attention is focused, linear, and appropriate.  NEURO: Oriented as arrived to appointment on time with no prompting.   Attestation Statements:   This was prepared with the assistance of Engineer, civil (consulting).  Occasional wrong-word or sound-a-like substitutions may have occurred due to the inherent limitations of voice recognition   Corinna Capra, DO

## 2023-04-02 ENCOUNTER — Ambulatory Visit: Payer: 59 | Admitting: Bariatrics

## 2023-04-02 ENCOUNTER — Encounter: Payer: Self-pay | Admitting: Bariatrics

## 2023-04-02 VITALS — BP 136/82 | HR 72 | Temp 97.9°F | Ht 71.0 in | Wt 235.0 lb

## 2023-04-02 DIAGNOSIS — Z6832 Body mass index (BMI) 32.0-32.9, adult: Secondary | ICD-10-CM

## 2023-04-02 DIAGNOSIS — Z7985 Long-term (current) use of injectable non-insulin antidiabetic drugs: Secondary | ICD-10-CM

## 2023-04-02 DIAGNOSIS — E559 Vitamin D deficiency, unspecified: Secondary | ICD-10-CM | POA: Diagnosis not present

## 2023-04-02 DIAGNOSIS — E66811 Obesity, class 1: Secondary | ICD-10-CM

## 2023-04-02 DIAGNOSIS — E119 Type 2 diabetes mellitus without complications: Secondary | ICD-10-CM

## 2023-04-02 DIAGNOSIS — E669 Obesity, unspecified: Secondary | ICD-10-CM | POA: Diagnosis not present

## 2023-04-02 DIAGNOSIS — E1169 Type 2 diabetes mellitus with other specified complication: Secondary | ICD-10-CM

## 2023-04-02 MED ORDER — VITAMIN D (ERGOCALCIFEROL) 1.25 MG (50000 UNIT) PO CAPS: 50000.0000 [IU] | ORAL_CAPSULE | ORAL | 0 refills | Status: DC

## 2023-04-02 MED ORDER — SEMAGLUTIDE (2 MG/DOSE) 8 MG/3ML ~~LOC~~ SOPN: 2.0000 mg | PEN_INJECTOR | SUBCUTANEOUS | 0 refills | Status: DC

## 2023-04-02 NOTE — Progress Notes (Signed)
WEIGHT SUMMARY AND BIOMETRICS  Weight Lost Since Last Visit: 0  Weight Gained Since Last Visit: 2lb   Vitals Temp: 97.9 F (36.6 C) BP: 136/82 Pulse Rate: 72 SpO2: 100 %   Anthropometric Measurements Height: 5\' 11"  (1.803 m) Weight: 235 lb (106.6 kg) BMI (Calculated): 32.79 Weight at Last Visit: 233lb Weight Lost Since Last Visit: 0 Weight Gained Since Last Visit: 2lb Starting Weight: 242lb Total Weight Loss (lbs): 7 lb (3.175 kg)   Body Composition  Body Fat %: 30.2 % Fat Mass (lbs): 71.2 lbs Muscle Mass (lbs): 155.8 lbs Total Body Water (lbs): 112 lbs Visceral Fat Rating : 19   Other Clinical Data Fasting: no Labs: no Today's Visit #: 43 Starting Date: 07/04/20    OBESITY Marvin [name] is here to discuss his progress with his obesity treatment plan along with follow-up of his obesity related diagnoses.    Nutrition Plan: the Category 2 plan - 70% adherence.  Current exercise: walking and fishing  Interim History:  He is up 2 lbs since his last visit.  Eating all of the food on the plan., Protein intake is as prescribed, Is not skipping meals, and Water intake is adequate.   Pharmacotherapy: Jaegar is on Ozempic 2 mg SQ weekly Adverse side effects: None Hunger is moderately controlled.  Cravings are moderately controlled.  Assessment/Plan:   Vitamin D Deficiency Vitamin D is not at goal of 50.  Most recent vitamin D level was 42.4. He is on  prescription ergocalciferol 50,000 IU weekly. Lab Results  Component Value Date   VD25OH 42.4 10/23/2020   VD25OH 7.3 (L) 07/04/2020    Plan: Refill prescription vitamin D 50,000 IU weekly.   Type II Diabetes HgbA1c is at goal. Last A1c was 5.9 CBGs: Not checking      Episodes of hypoglycemia: no Medication(s): Ozempic 2 mg SQ weekly  Lab Results  Component Value Date   HGBA1C 5.9  11/18/2022   HGBA1C 5.9 (H) 07/02/2022   HGBA1C 6.1 05/12/2022   Lab Results  Component Value Date   MICROALBUR <0.7 11/18/2022   LDLCALC 89 11/18/2022   CREATININE 0.99 11/18/2022   Lab Results  Component Value Date   GFR 77.58 11/18/2022   GFR 64.38 05/12/2022   GFR 75.39 11/08/2021    Plan: Continue and refill Ozempic 2 mg SQ weekly Continue all other medications.  Will keep all carbohydrates low both sweets and starches.  Will continue exercise regimen to 30 to 60 minutes on most days of the week.  Aim for 7 to 9 hours of sleep nightly.  Eat more low glycemic index foods.    Generalized Obesity: Current BMI BMI (Calculated): 32.79   Pharmacotherapy Plan Continue and refill  Ozempic 2 mg SQ weekly  Lea is currently in the action stage of change. As such, his goal is to continue with weight loss efforts.  He has agreed to the Category 2 plan.  Exercise goals: Older adults should determine their level of effort for physical activity relative to their level of fitness.   Behavioral modification strategies: increasing lean protein intake, no meal skipping, increase water intake, better snacking choices, planning for success, avoiding temptations, and keep healthy foods in the home.  Ashir has agreed to follow-up with our clinic in 8 weeks.   Objective:   VITALS: Per patient if applicable, see vitals. GENERAL: Alert and in no acute distress. CARDIOPULMONARY: No increased WOB. Speaking in clear sentences.  PSYCH: Pleasant and cooperative. Speech normal rate and rhythm. Affect is appropriate. Insight and judgement are appropriate. Attention is focused, linear, and appropriate.  NEURO: Oriented as arrived to appointment on time with no prompting.   Attestation Statements:   This was prepared with the assistance of Engineer, civil (consulting).  Occasional wrong-word or sound-a-like substitutions may have occurred due to the inherent limitations of voice recognition    Corinna Capra, DO

## 2023-04-03 ENCOUNTER — Other Ambulatory Visit: Payer: Self-pay | Admitting: Student

## 2023-04-03 DIAGNOSIS — Z8719 Personal history of other diseases of the digestive system: Secondary | ICD-10-CM | POA: Diagnosis not present

## 2023-04-03 DIAGNOSIS — R1032 Left lower quadrant pain: Secondary | ICD-10-CM | POA: Diagnosis not present

## 2023-04-03 DIAGNOSIS — Z9889 Other specified postprocedural states: Secondary | ICD-10-CM | POA: Diagnosis not present

## 2023-04-06 ENCOUNTER — Encounter: Payer: Self-pay | Admitting: Student

## 2023-04-08 ENCOUNTER — Ambulatory Visit
Admission: RE | Admit: 2023-04-08 | Discharge: 2023-04-08 | Discharge status: Home / Self Care | Payer: 59 | Source: Ambulatory Visit | Attending: Student

## 2023-04-08 DIAGNOSIS — R1032 Left lower quadrant pain: Secondary | ICD-10-CM | POA: Diagnosis not present

## 2023-04-08 MED ORDER — IOPAMIDOL (ISOVUE-300) INJECTION 61%
100.0000 mL | Freq: Once | INTRAVENOUS | Status: AC | PRN
Start: 2023-04-08 — End: 2023-04-08
  Administered 2023-04-08: 100 mL via INTRAVENOUS

## 2023-05-05 ENCOUNTER — Ambulatory Visit: Payer: Self-pay | Admitting: Surgery

## 2023-05-05 DIAGNOSIS — M6208 Separation of muscle (nontraumatic), other site: Secondary | ICD-10-CM | POA: Diagnosis not present

## 2023-05-05 DIAGNOSIS — E66811 Obesity, class 1: Secondary | ICD-10-CM | POA: Diagnosis not present

## 2023-05-05 DIAGNOSIS — K430 Incisional hernia with obstruction, without gangrene: Secondary | ICD-10-CM | POA: Diagnosis not present

## 2023-05-05 DIAGNOSIS — K4091 Unilateral inguinal hernia, without obstruction or gangrene, recurrent: Secondary | ICD-10-CM | POA: Diagnosis not present

## 2023-05-05 DIAGNOSIS — K5909 Other constipation: Secondary | ICD-10-CM | POA: Diagnosis not present

## 2023-05-05 DIAGNOSIS — K4090 Unilateral inguinal hernia, without obstruction or gangrene, not specified as recurrent: Secondary | ICD-10-CM | POA: Diagnosis not present

## 2023-05-19 ENCOUNTER — Encounter: Payer: Self-pay | Admitting: Nurse Practitioner

## 2023-05-19 ENCOUNTER — Ambulatory Visit (INDEPENDENT_AMBULATORY_CARE_PROVIDER_SITE_OTHER): Payer: 59 | Admitting: Nurse Practitioner

## 2023-05-19 VITALS — BP 122/78 | HR 70 | Temp 98.2°F | Ht 71.0 in | Wt 244.2 lb

## 2023-05-19 DIAGNOSIS — K219 Gastro-esophageal reflux disease without esophagitis: Secondary | ICD-10-CM

## 2023-05-19 DIAGNOSIS — Z7985 Long-term (current) use of injectable non-insulin antidiabetic drugs: Secondary | ICD-10-CM

## 2023-05-19 DIAGNOSIS — E785 Hyperlipidemia, unspecified: Secondary | ICD-10-CM | POA: Diagnosis not present

## 2023-05-19 DIAGNOSIS — K5903 Drug induced constipation: Secondary | ICD-10-CM | POA: Diagnosis not present

## 2023-05-19 DIAGNOSIS — I10 Essential (primary) hypertension: Secondary | ICD-10-CM | POA: Diagnosis not present

## 2023-05-19 DIAGNOSIS — E1169 Type 2 diabetes mellitus with other specified complication: Secondary | ICD-10-CM

## 2023-05-19 DIAGNOSIS — R1013 Epigastric pain: Secondary | ICD-10-CM | POA: Diagnosis not present

## 2023-05-19 LAB — COMPREHENSIVE METABOLIC PANEL WITH GFR
ALT: 18 U/L (ref 0–53)
AST: 22 U/L (ref 0–37)
Albumin: 4.7 g/dL (ref 3.5–5.2)
Alkaline Phosphatase: 71 U/L (ref 39–117)
BUN: 13 mg/dL (ref 6–23)
CO2: 28 meq/L (ref 19–32)
Calcium: 9.7 mg/dL (ref 8.4–10.5)
Chloride: 105 meq/L (ref 96–112)
Creatinine, Ser: 1.1 mg/dL (ref 0.40–1.50)
GFR: 68.12 mL/min (ref >60.00)
Glucose, Bld: 83 mg/dL (ref 70–99)
Potassium: 4.1 meq/L (ref 3.5–5.1)
Sodium: 141 meq/L (ref 135–145)
Total Bilirubin: 0.7 mg/dL (ref 0.2–1.2)
Total Protein: 7.3 g/dL (ref 6.0–8.3)

## 2023-05-19 LAB — CBC
HCT: 44.6 % (ref 39.0–52.0)
Hemoglobin: 14.7 g/dL (ref 13.0–17.0)
MCHC: 33 g/dL (ref 30.0–36.0)
MCV: 85.4 fl (ref 78.0–100.0)
Platelets: 184 10*3/uL (ref 150.0–400.0)
RBC: 5.22 Mil/uL (ref 4.22–5.81)
RDW: 14.3 % (ref 11.5–15.5)
WBC: 9.2 10*3/uL (ref 4.0–10.5)

## 2023-05-19 LAB — HEMOGLOBIN A1C: Hgb A1c MFr Bld: 6 % (ref 4.6–6.5)

## 2023-05-19 LAB — LDL CHOLESTEROL, DIRECT: Direct LDL: 128 mg/dL

## 2023-05-19 MED ORDER — PANTOPRAZOLE SODIUM 20 MG PO TBEC: 20.0000 mg | DELAYED_RELEASE_TABLET | Freq: Every day | ORAL | 0 refills | Status: DC

## 2023-05-19 NOTE — Progress Notes (Signed)
 protonix               Established Patient Visit  Patient: Marvin [name]   DOB: 09-21-1950   73 y.o. Male  MRN: [id number] Visit Date: 05/19/2023  Subjective:    Chief Complaint  Patient presents with   Follow-up    6 month f/u    acid reflux    Couple of months of heart burn    HPI He is scheduled for left inguinal hernia repair on 08/07/2023 by Dr. Michaell Cowing. S/p inguinal repair in 2024 with no anesthesia complication. Last ECG 2023. No hx of MI or CVA or TIA or PE or DVT  Constipation Improved with use of metamucil BID  HTN (hypertension) BP at goal with amlodipine BP Readings from Last 3 Encounters:  05/19/23 122/78  04/02/23 136/82  01/29/23 124/77   Maintain med dose Repeat CMP  Dyspepsia Recurrent due to current use of GLP-1RA and change in diet (eating late). No dysphagia, no ABDOMEN pain, no change in GI function.  Sent pantoprozole 20mg  Advised to avoid eating within 4hrs of bedtime.  Hyperlipidemia associated with type 2 diabetes mellitus (HCC) Repeat direct LDL: not at goal Current use of zocor 20mg  biweekly Unable to tolerate atorvastatin at low dose. Entered referral to lipid clinic  Wt Readings from Last 3 Encounters:  05/19/23 244 lb 3.2 oz (110.8 kg)  04/02/23 235 lb (106.6 kg)  01/29/23 233 lb (105.7 kg)    Reviewed medical, surgical, and social history today  Medications: Outpatient Medications Prior to Visit  Medication Sig   allopurinol (ZYLOPRIM) 100 MG tablet Take 1 tablet (100 mg total) by mouth daily.   amLODipine (NORVASC) 10 MG tablet Take 1 tablet (10 mg total) by mouth daily.   aspirin EC 81 MG tablet Take 1 tablet (81 mg total) by mouth daily. Swallow whole.   colchicine 0.6 MG tablet Take 0.6 mg by mouth daily as needed (Gout).   cyclobenzaprine (FLEXERIL) 5 MG tablet Take 1 tablet (5 mg total) by mouth daily as needed for muscle spasms.   fluticasone (FLONASE) 50 MCG/ACT nasal spray Place 2 sprays into both nostrils daily as  needed for allergies or rhinitis.   Semaglutide, 2 MG/DOSE, 8 MG/3ML SOPN Inject 2 mg as directed once a week.   simvastatin (ZOCOR) 20 MG tablet Take 1 tablet (20 mg total) by mouth 2 (two) times a week. (Patient taking differently: Take 20 mg by mouth once a week.)   Vitamin D, Ergocalciferol, (DRISDOL) 1.25 MG (50000 UNIT) CAPS capsule Take 1 capsule (50,000 Units total) by mouth every 7 (seven) days.   No facility-administered medications prior to visit.   Reviewed past medical and social history.   ROS per HPI above  Last CBC Lab Results  Component Value Date   WBC 9.2 05/19/2023   HGB 14.7 05/19/2023   HCT 44.6 05/19/2023   MCV 85.4 05/19/2023   MCH 27.6 07/02/2022   RDW 14.3 05/19/2023   PLT 184.0 05/19/2023   Last metabolic panel Lab Results  Component Value Date   GLUCOSE 83 05/19/2023   NA 141 05/19/2023   K 4.1 05/19/2023   CL 105 05/19/2023   CO2 28 05/19/2023   BUN 13 05/19/2023   CREATININE 1.10 05/19/2023   GFR 68.12 05/19/2023   CALCIUM 9.7 05/19/2023   PHOS 3.3 11/18/2022   PROT 7.3 05/19/2023   ALBUMIN 4.7 05/19/2023   LABGLOB 2.6 10/23/2020   AGRATIO 1.8 10/23/2020   BILITOT 0.7 05/19/2023  ALKPHOS 71 05/19/2023   AST 22 05/19/2023   ALT 18 05/19/2023   ANIONGAP 8 07/02/2022   Last lipids Lab Results  Component Value Date   CHOL 148 11/18/2022   HDL 41.70 11/18/2022   LDLCALC 89 11/18/2022   LDLDIRECT 128.0 05/19/2023   TRIG 89.0 11/18/2022   CHOLHDL 4 11/18/2022   Last hemoglobin A1c Lab Results  Component Value Date   HGBA1C 6.0 05/19/2023        Objective:  BP 122/78 (BP Location: Left Arm, Patient Position: Sitting, Cuff Size: Large)   Pulse 70   Temp 98.2 F (36.8 C) (Temporal)   Ht 5\' 11"  (1.803 m)   Wt 244 lb 3.2 oz (110.8 kg)   SpO2 96%   BMI 34.06 kg/m   ECG: NSR, no change compared to 2023 ECG    Physical Exam Vitals and nursing note reviewed.  Cardiovascular:     Rate and Rhythm: Normal rate and regular  rhythm.     Pulses: Normal pulses.     Heart sounds: Normal heart sounds.  Pulmonary:     Effort: Pulmonary effort is normal.     Breath sounds: Normal breath sounds.  Abdominal:     General: There is no distension.     Tenderness: There is no abdominal tenderness. There is no guarding.  Neurological:     Mental Status: He is alert and oriented to person, place, and time.     Results for orders placed or performed in visit on 05/19/23  Hemoglobin A1c  Result Value Ref Range   Hgb A1c MFr Bld 6.0 4.6 - 6.5 %  Comprehensive metabolic panel with GFR  Result Value Ref Range   Sodium 141 135 - 145 mEq/L   Potassium 4.1 3.5 - 5.1 mEq/L   Chloride 105 96 - 112 mEq/L   CO2 28 19 - 32 mEq/L   Glucose, Bld 83 70 - 99 mg/dL   BUN 13 6 - 23 mg/dL   Creatinine, Ser 0.45 0.40 - 1.50 mg/dL   Total Bilirubin 0.7 0.2 - 1.2 mg/dL   Alkaline Phosphatase 71 39 - 117 U/L   AST 22 0 - 37 U/L   ALT 18 0 - 53 U/L   Total Protein 7.3 6.0 - 8.3 g/dL   Albumin 4.7 3.5 - 5.2 g/dL   GFR 40.98 >11.91 mL/min   Calcium 9.7 8.4 - 10.5 mg/dL  CBC  Result Value Ref Range   WBC 9.2 4.0 - 10.5 K/uL   RBC 5.22 4.22 - 5.81 Mil/uL   Platelets 184.0 150.0 - 400.0 K/uL   Hemoglobin 14.7 13.0 - 17.0 g/dL   HCT 47.8 29.5 - 62.1 %   MCV 85.4 78.0 - 100.0 fl   MCHC 33.0 30.0 - 36.0 g/dL   RDW 30.8 65.7 - 84.6 %  Direct LDL  Result Value Ref Range   Direct LDL 128.0 mg/dL      Assessment & Plan:    Problem List Items Addressed This Visit     Constipation   Improved with use of metamucil BID      DM (diabetes mellitus) (HCC)   Relevant Orders   Hemoglobin A1c (Completed)   [facility]B [facility]erral to [facility]   Dyspepsia   Recurrent due to current use of GLP-1RA and change in diet (eating late). No dysphagia, no ABDOMEN pain, no change in GI function.  Sent pantoprozole 20mg  Advised to avoid eating within 4hrs of bedtime.  Relevant Medications   pantoprazole (PROTONIX) 20 MG  tablet   HTN (hypertension) - Primary   BP at goal with amlodipine BP Readings from Last 3 Encounters:  05/19/23 122/78  04/02/23 136/82  01/29/23 124/77   Maintain med dose Repeat CMP      Relevant Orders   EKG 12-Lead (Completed)   Comprehensive metabolic panel with GFR (Completed)   CBC (Completed)   Hyperlipidemia associated with type 2 diabetes mellitus (HCC)   Repeat direct LDL: not at goal Current use of zocor 20mg  biweekly Unable to tolerate atorvastatin at low dose. Entered referral to lipid clinic      Relevant Orders   Comprehensive metabolic panel with GFR (Completed)   Direct LDL (Completed)   AMB Referral to [facility]   Return in about 6 months (around 11/18/2023) for HTN, DM, hyperlipidemia (fasting).     Alysia Penna, NP

## 2023-05-19 NOTE — Patient Instructions (Signed)
 Go to lab Maintain Heart healthy diet and daily exercise. Maintain current medications. Send pre op clearance form if needed for June surgery.

## 2023-05-19 NOTE — Assessment & Plan Note (Addendum)
 Repeat direct LDL: not at goal Current use of zocor 20mg  biweekly Unable to tolerate atorvastatin at low dose. Entered referral to lipid clinic

## 2023-05-19 NOTE — Assessment & Plan Note (Signed)
 Improved with use of metamucil BID

## 2023-05-19 NOTE — Assessment & Plan Note (Addendum)
 Recurrent due to current use of GLP-1RA and change in diet (eating late). No dysphagia, no ABDOMEN pain, no change in GI function.  Sent pantoprozole 20mg  Advised to avoid eating within 4hrs of bedtime.

## 2023-05-19 NOTE — Assessment & Plan Note (Signed)
 BP at goal with amlodipine BP Readings from Last 3 Encounters:  05/19/23 122/78  04/02/23 136/82  01/29/23 124/77   Maintain med dose Repeat CMP

## 2023-05-23 ENCOUNTER — Other Ambulatory Visit: Payer: Self-pay | Admitting: Nurse Practitioner

## 2023-05-23 DIAGNOSIS — I10 Essential (primary) hypertension: Secondary | ICD-10-CM

## 2023-05-26 NOTE — Telephone Encounter (Signed)
 Medication: Amlodipine (Norvasc) 10 mg  Directions: Take 1 tablet by mouth daily Last given: 05/12/22 Number refills: 3 Last o/v: 05/19/23 Follow up: 6 months (Around 11/18/23), 11/23/23  Labs: 05/19/23

## 2023-06-01 ENCOUNTER — Encounter: Payer: Self-pay | Admitting: Bariatrics

## 2023-06-01 ENCOUNTER — Telehealth: Payer: Self-pay | Admitting: Bariatrics

## 2023-06-01 ENCOUNTER — Ambulatory Visit: Payer: 59 | Admitting: Bariatrics

## 2023-06-01 ENCOUNTER — Other Ambulatory Visit: Payer: Self-pay | Admitting: Bariatrics

## 2023-06-01 VITALS — BP 144/85 | HR 68 | Temp 97.6°F | Ht 71.0 in | Wt 244.0 lb

## 2023-06-01 DIAGNOSIS — Z6834 Body mass index (BMI) 34.0-34.9, adult: Secondary | ICD-10-CM

## 2023-06-01 DIAGNOSIS — E1169 Type 2 diabetes mellitus with other specified complication: Secondary | ICD-10-CM

## 2023-06-01 DIAGNOSIS — E669 Obesity, unspecified: Secondary | ICD-10-CM | POA: Diagnosis not present

## 2023-06-01 DIAGNOSIS — E66811 Obesity, class 1: Secondary | ICD-10-CM

## 2023-06-01 DIAGNOSIS — E559 Vitamin D deficiency, unspecified: Secondary | ICD-10-CM | POA: Diagnosis not present

## 2023-06-01 DIAGNOSIS — R7303 Prediabetes: Secondary | ICD-10-CM | POA: Diagnosis not present

## 2023-06-01 MED ORDER — SEMAGLUTIDE (2 MG/DOSE) 8 MG/3ML ~~LOC~~ SOPN: 2.0000 mg | PEN_INJECTOR | SUBCUTANEOUS | 0 refills | Status: DC

## 2023-06-01 MED ORDER — VITAMIN D (ERGOCALCIFEROL) 1.25 MG (50000 UNIT) PO CAPS
50000.0000 [IU] | ORAL_CAPSULE | ORAL | 0 refills | Status: DC
Start: 2023-06-01 — End: 2023-06-01

## 2023-06-01 MED ORDER — TIRZEPATIDE 5 MG/0.5ML ~~LOC~~ SOAJ: 5.0000 mg | SUBCUTANEOUS | 0 refills | Status: DC

## 2023-06-01 MED ORDER — VITAMIN D (ERGOCALCIFEROL) 1.25 MG (50000 UNIT) PO CAPS: 50000.0000 [IU] | ORAL_CAPSULE | ORAL | 0 refills | Status: DC

## 2023-06-01 NOTE — Progress Notes (Signed)
 WEIGHT SUMMARY AND BIOMETRICS  Weight Lost Since Last Visit: 0  Weight Gained Since Last Visit: 9lb   Vitals Temp: 97.6 F (36.4 C) BP: (!) 144/85 Pulse Rate: 68 SpO2: 98 %   Anthropometric Measurements Height: 5\' 11"  (1.803 m) Weight: 244 lb (110.7 kg) BMI (Calculated): 34.05 Weight at Last Visit: 235lb Weight Lost Since Last Visit: 0 Weight Gained Since Last Visit: 9lb Starting Weight: 242lb Total Weight Loss (lbs): 0 lb (0 kg)   Body Composition  Body Fat %: 32.2 % Fat Mass (lbs): 78.8 lbs Muscle Mass (lbs): 157.8 lbs Total Body Water (lbs): 116.8 lbs Visceral Fat Rating : 20   Other Clinical Data Fasting: no Labs: no Today's Visit #: 37 Starting Date: 07/04/20    OBESITY Marvin [name] is here to discuss his progress with his obesity treatment plan along with follow-up of his obesity related diagnoses.    Nutrition Plan: the Category 2 plan - 50% adherence.  Current exercise: none  Interim History:  He is up 9 lbs since his last visit on 04/02/23.  Eating all of the food on the plan., Protein intake is as prescribed, Is not exceeding snack calorie allotment, Is not skipping meals, Not meeting calorie goals., and Water intake is adequate.   Pharmacotherapy: Damarion is on Ozempic 2 mg SQ weekly Adverse side effects: None Hunger is moderately controlled.  Cravings are moderately controlled.  Assessment/Plan:   Vitamin D Deficiency Vitamin D is not at goal of 50.  Most recent vitamin D level was 42.4. He is on  prescription ergocalciferol 50,000 IU weekly. Lab Results  Component Value Date   VD25OH 42.4 10/23/2020   VD25OH 7.3 (L) 07/04/2020    Plan: Refill prescription vitamin D 50,000 IU weekly.    Prediabetes Last A1c was 6.0  Medication(s): Ozempic 2 mg SQ weekly Lab Results  Component Value Date   HGBA1C 6.0 05/19/2023    HGBA1C 5.9 11/18/2022   HGBA1C 5.9 (H) 07/02/2022   HGBA1C 6.1 05/12/2022   HGBA1C 5.7 (H) 11/08/2021   Lab Results  Component Value Date   INSULIN 11.7 10/23/2020   INSULIN 10.6 07/04/2020    Plan: Will minimize all refined carbohydrates both sweets and starches.  Will work on the plan and exercise.  Consider both aerobic and resistance training.  Will keep protein, water, and fiber intake high.  Increase Polyunsaturated and Monounsaturated fats to increase satiety and encourage weight loss.  Start Mounjaro 5.0 mg SQ weekly  Will transfer to Mounjaro for weight and glucose control.  Will cook more at home.    Generalized Obesity: Current BMI BMI (Calculated): 34.05   Pharmacotherapy Plan Start  Mounjaro 5.0 mg SQ weekly  Alfonzia is currently in the action stage of change. As such, his goal is to continue with weight loss efforts.  He has agreed to the Category 2 plan.  Exercise  goals: Older adults should determine their level of effort for physical activity relative to their level of fitness.   Behavioral modification strategies: increasing lean protein intake, decrease eating out, meal planning , decrease liquid calories, increase water intake, better snacking choices, planning for success, avoiding temptations, keep healthy foods in the home, and mindful eating.  Ziyad has agreed to follow-up with our clinic in 4 weeks.     Objective:   VITALS: Per patient if applicable, see vitals. GENERAL: Alert and in no acute distress. CARDIOPULMONARY: No increased WOB. Speaking in clear sentences.  PSYCH: Pleasant and cooperative. Speech normal rate and rhythm. Affect is appropriate. Insight and judgement are appropriate. Attention is focused, linear, and appropriate.  NEURO: Oriented as arrived to appointment on time with no prompting.   Attestation Statements:   This was prepared with the assistance of Engineer, civil (consulting).  Occasional wrong-word or sound-a-like  substitutions may have occurred due to the inherent limitations of voice recognition   Kirk Peper, DO

## 2023-06-01 NOTE — Telephone Encounter (Signed)
 Notified patient that Dr. Bevin Bucks sent over Ozempic to his pharmacy. Patient verbalized understanding.

## 2023-06-01 NOTE — Telephone Encounter (Signed)
 Pt is calling because the he need to go back to his original Rx. The new RX prescribe today cost too much.

## 2023-06-05 ENCOUNTER — Other Ambulatory Visit: Payer: Self-pay | Admitting: Nurse Practitioner

## 2023-06-05 DIAGNOSIS — E790 Hyperuricemia without signs of inflammatory arthritis and tophaceous disease: Secondary | ICD-10-CM

## 2023-06-11 ENCOUNTER — Ambulatory Visit
Admission: RE | Admit: 2023-06-11 | Discharge: 2023-06-11 | Disposition: A | Discharge status: Home / Self Care | Source: Ambulatory Visit | Attending: Family Medicine | Admitting: Family Medicine

## 2023-06-11 VITALS — BP 122/83 | HR 65 | Temp 98.3°F | Resp 18 | Ht 71.0 in | Wt 234.0 lb

## 2023-06-11 DIAGNOSIS — L237 Allergic contact dermatitis due to plants, except food: Secondary | ICD-10-CM | POA: Diagnosis not present

## 2023-06-11 DIAGNOSIS — L739 Follicular disorder, unspecified: Secondary | ICD-10-CM

## 2023-06-11 MED ORDER — METHYLPREDNISOLONE ACETATE 80 MG/ML IJ SUSP
80.0000 mg | Freq: Once | INTRAMUSCULAR | Status: AC
  Administered 2023-06-11: 80 mg via INTRAMUSCULAR

## 2023-06-11 MED ORDER — PREDNISONE 10 MG (21) PO TBPK: ORAL_TABLET | Freq: Every day | ORAL | 0 refills | Status: DC

## 2023-06-11 MED ORDER — DOXYCYCLINE HYCLATE 100 MG PO CAPS: 100.0000 mg | ORAL_CAPSULE | Freq: Two times a day (BID) | ORAL | 0 refills | Status: AC

## 2023-06-11 NOTE — ED Provider Notes (Addendum)
 [facility]    CSN: [id number] Arrival date & time: 06/11/23  1151      History   Chief Complaint Chief Complaint  Patient presents with   Insect Bite    Bump inside outside nose - Entered by patient    HPI Marvin [name] is a 73 y.o. male.   HPI Pleasant 73 year old male presents with possible insect bite of nose x 1 week and poison ivy dermatitis of left arm x 3 weeks.  PMH significant for obesity, HTN, and osteoarthritis.  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   S/P hernia repair 03/15/2020   Primary osteoarthritis of right hip 10/03/2019   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   Chronic bilateral low back pain without sciatica 02/21/2019   DM (diabetes mellitus) (HCC) 08/25/2017   Hyperuricemia 08/25/2017   Dyspepsia 02/23/2017   HTN (hypertension) 09/11/2015   Hyperlipidemia associated with type 2 diabetes mellitus (HCC) 09/11/2015    Past Surgical History:  Procedure Laterality Date   HERNIA REPAIR     twice   INGUINAL HERNIA REPAIR Left 07/15/2022   Procedure: OPEN LEFT INGUINAL HERNIA REPAIR;  Surgeon: Jacolyn Matar, MD;  Location: [facility];  Service: General;  Laterality: Left;   left inguinal hernia repair      left shoulder rotator cuff repair      REPLACEMENT TOTAL KNEE     partial   right knee replacement      SHOULDER SURGERY     right   TOTAL HIP ARTHROPLASTY  Right 10/03/2019   Procedure: RIGHT TOTAL HIP ARTHROPLASTY ANTERIOR APPROACH;  Surgeon: Wes Hamman, MD;  Location: [facility];  Service: Orthopedics;  Laterality: Right;   UMBILICAL HERNIA REPAIR     x 2       Home Medications    Prior to Admission medications   Medication Sig Start Date End Date Taking? Authorizing Provider  allopurinol  (ZYLOPRIM ) 100 MG tablet TAKE 1 TABLET BY MOUTH DAILY 06/08/23  Yes Nche, Connye Delaine, NP  amLODipine  (NORVASC ) 10 MG tablet TAKE 1 TABLET BY MOUTH DAILY 05/26/23  Yes Nche, Connye Delaine, NP  aspirin  EC 81 MG tablet Take 1 tablet (81 mg total) by mouth daily. Swallow whole. 11/08/21  Yes Nche, Connye Delaine, NP  colchicine  0.6 MG tablet Take 0.6 mg by mouth daily as needed (Gout).   Yes [provider]  cyclobenzaprine  (FLEXERIL ) 5 MG tablet Take 1 tablet (5 mg total) by mouth daily as needed for muscle spasms. 11/18/22  Yes Nche, Connye Delaine, NP  doxycycline  (VIBRAMYCIN ) 100 MG capsule Take 1 capsule (100 mg total) by mouth 2 (two) times daily for 7 days. 06/11/23 06/18/23 Yes Leonides Ramp, FNP  fluticasone  (FLONASE ) 50 MCG/ACT nasal  spray Place 2 sprays into both nostrils daily as needed for allergies or rhinitis. 11/18/22  Yes Nche, Connye Delaine, NP  pantoprazole  (PROTONIX ) 20 MG tablet Take 1 tablet (20 mg total) by mouth daily. 05/19/23  Yes Nche, Connye Delaine, NP  predniSONE  (STERAPRED UNI-PAK 21 TAB) 10 MG (21) TBPK tablet Take by mouth daily. Take 6 tabs by mouth daily  for 2 days, then 5 tabs for 2 days, then 4 tabs for 2 days, then 3 tabs for 2 days, 2 tabs for 2 days, then 1 tab by mouth daily for 2 days 06/11/23  Yes Leonides Ramp, FNP  Semaglutide , 2 MG/DOSE, 8 MG/3ML SOPN Inject 2 mg as directed once a week. 06/01/23  Yes Kirk Peper A, DO  simvastatin  (ZOCOR ) 20 MG tablet Take 1 tablet (20 mg total) by mouth 2 (two) times a week. Patient taking differently: Take 20 mg by mouth once a week. 11/20/22  Yes Nche, Connye Delaine, NP  Vitamin D ,  Ergocalciferol , (DRISDOL ) 1.25 MG (50000 UNIT) CAPS capsule Take 1 capsule (50,000 Units total) by mouth every 7 (seven) days. 06/01/23  Yes Lorayne Rocks, DO    Family History Family History  Problem Relation Age of Onset   Healthy Mother    Healthy Father    Cancer Brother        bone cancer   Cancer Maternal Aunt        breast cancer   Cancer Maternal Uncle        lukemia   Cancer Cousin        unknown   Cancer Maternal Uncle        lukemia   Cancer Paternal Uncle        prostate   Hearing loss Maternal Grandfather 64    Social History Social History   Tobacco Use   Smoking status: Former    Current packs/day: 0.00    Types: Cigarettes    Quit date: 03/20/1998    Years since quitting: 25.2   Smokeless tobacco: Former    Types: Associate Professor status: Never Used  Substance Use Topics   Alcohol  use: Not Currently    Comment: hx of 23 years ago   Drug use: Not Currently    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     Physical Exam Triage Vital Signs ED Triage Vitals  Encounter Vitals Group     BP      Systolic BP Percentile      Diastolic BP Percentile      Pulse      Resp      Temp      Temp src      SpO2      Weight      Height      Head Circumference      Peak Flow      Pain Score      Pain Loc      Pain Education      Exclude from Growth Chart    No data found.  Updated Vital Signs BP 122/83 (BP Location: Right Arm)   Pulse 65   Temp 98.3 F (36.8 C) (Oral)   Resp 18   Ht 5\' 11"  (1.803 m)   Wt 234 lb (106.1 kg)   SpO2 95%  BMI 32.64 kg/m    Physical Exam Vitals and nursing note reviewed.  Constitutional:      General: He is not in acute distress.    Appearance: Normal appearance. He is obese. He is not ill-appearing.  HENT:     Head: Normocephalic and atraumatic.     Right Ear: Tympanic  membrane, ear canal and external ear normal.     Left Ear: Tympanic membrane, ear canal and external ear normal.     Nose:     Comments: Left nostril: Tiny (less than 1 mm) papulopustule, on erythematous base noted    Mouth/Throat:     Mouth: Mucous membranes are moist.     Pharynx: Oropharynx is clear.  Eyes:     Extraocular Movements: Extraocular movements intact.     Conjunctiva/sclera: Conjunctivae normal.     Pupils: Pupils are equal, round, and reactive to light.  Cardiovascular:     Rate and Rhythm: Normal rate and regular rhythm.     Pulses: Normal pulses.     Heart sounds: Normal heart sounds.  Pulmonary:     Effort: Pulmonary effort is normal.     Breath sounds: Normal breath sounds. No wheezing, rhonchi or rales.  Musculoskeletal:        General: Normal range of motion.     Cervical back: Normal range of motion and neck supple.  Skin:    General: Skin is warm and dry.     Comments: Left lower arm (volar aspects): Pruritic, erythematous, maculopapular eruption with grouped linear vesicular lesions noted-please see image below  Neurological:     General: No focal deficit present.     Mental Status: He is alert and oriented to person, place, and time. Mental status is at baseline.  Psychiatric:        Mood and Affect: Mood normal.        Behavior: Behavior normal.      UC Treatments / Results  Labs (all labs ordered are listed, but only abnormal results are displayed) Labs Reviewed - No data to display  EKG   Radiology No results found.  Procedures Procedures (including critical care time)  Medications Ordered in UC Medications  methylPREDNISolone  acetate (DEPO-MEDROL ) injection 80 mg (80 mg Intramuscular Given 06/11/23 1241)    Initial Impression / Assessment and Plan / UC Course  I have reviewed the triage vital signs and the nursing notes.  Pertinent labs & imaging results that were available during my care of the patient were reviewed by me and  considered in my medical decision making (see chart for details).     MDM: 1.  Poison ivy dermatitis-IM Depo-Medrol  80 mg given once in clinic and prior to discharge, Rx'd Sterapred Unipak 21 tab 10 mg; 2.  Folliculitis-Rx'd doxycycline  100 mg capsule: Take 1 capsule twice daily x 7 days. Advised patient to take medications as directed with food to completion.  Advised patient to start Sterapred Unipak (prednisone  taper) tomorrow morning Friday, 06/12/2023.  Encouraged to increase daily water  intake to 64 ounces per day while taking these medications.  Advised if symptoms worsen and/or unresolved please follow-up with your PCP or here for further evaluation.  Patient discharged home, hemodynamically stable. Final Clinical Impressions(s) / UC Diagnoses   Final diagnoses:  Poison ivy dermatitis  Folliculitis     Discharge Instructions      Advised patient to take medications as directed with food to completion.  Advised patient to start Sterapred Unipak (prednisone  taper) tomorrow morning Friday, 06/12/2023.  Encouraged to increase daily water  intake to 64 ounces per day while taking these medications.  Advised if symptoms worsen and/or unresolved please follow-up with your PCP or here for further evaluation.     ED Prescriptions     Medication Sig Dispense Auth. Provider   doxycycline  (VIBRAMYCIN ) 100 MG capsule Take 1 capsule (100 mg total) by mouth 2 (two) times daily for 7 days. 14 capsule Almalik Weissberg, FNP   predniSONE  (STERAPRED UNI-PAK 21 TAB) 10 MG (21) TBPK tablet Take by mouth daily. Take 6 tabs by mouth daily  for 2 days, then 5 tabs for 2 days, then 4 tabs for 2 days, then 3 tabs for 2 days, 2 tabs for 2 days, then 1 tab by mouth daily for 2 days 42 tablet Leonides Ramp, FNP      PDMP not reviewed this encounter.   Leonides Ramp, FNP 06/11/23 1259    Leonides Ramp, FNP 06/11/23 1346

## 2023-06-11 NOTE — ED Triage Notes (Signed)
 Patient c/o possible insect bite on the left side of his nose x 1 week.  Patient unsure of bite, there is a bump inside of his nose and some swelling radiating up into his eye.  Also c/o poison oak on his left arm x 3 weeks.  Been applying Calamine lotion w/o relief.

## 2023-06-11 NOTE — Discharge Instructions (Addendum)
 Advised patient to take medications as directed with food to completion.  Advised patient to start Sterapred Unipak (prednisone  taper) tomorrow morning Friday, 06/12/2023.  Encouraged to increase daily water  intake to 64 ounces per day while taking these medications.  Advised if symptoms worsen and/or unresolved please follow-up with your PCP or here for further evaluation.

## 2023-06-29 ENCOUNTER — Ambulatory Visit (INDEPENDENT_AMBULATORY_CARE_PROVIDER_SITE_OTHER): Admitting: Bariatrics

## 2023-06-29 ENCOUNTER — Encounter: Payer: Self-pay | Admitting: Bariatrics

## 2023-06-29 VITALS — BP 146/82 | HR 75 | Temp 97.8°F | Ht 71.0 in | Wt 243.0 lb

## 2023-06-29 DIAGNOSIS — E119 Type 2 diabetes mellitus without complications: Secondary | ICD-10-CM

## 2023-06-29 DIAGNOSIS — Z7985 Long-term (current) use of injectable non-insulin antidiabetic drugs: Secondary | ICD-10-CM | POA: Diagnosis not present

## 2023-06-29 DIAGNOSIS — Z6833 Body mass index (BMI) 33.0-33.9, adult: Secondary | ICD-10-CM

## 2023-06-29 DIAGNOSIS — E669 Obesity, unspecified: Secondary | ICD-10-CM | POA: Diagnosis not present

## 2023-06-29 DIAGNOSIS — E559 Vitamin D deficiency, unspecified: Secondary | ICD-10-CM | POA: Diagnosis not present

## 2023-06-29 DIAGNOSIS — E66811 Obesity, class 1: Secondary | ICD-10-CM

## 2023-06-29 DIAGNOSIS — E1169 Type 2 diabetes mellitus with other specified complication: Secondary | ICD-10-CM

## 2023-06-29 DIAGNOSIS — E6609 Other obesity due to excess calories: Secondary | ICD-10-CM

## 2023-06-29 MED ORDER — SEMAGLUTIDE (2 MG/DOSE) 8 MG/3ML ~~LOC~~ SOPN: 2.0000 mg | PEN_INJECTOR | SUBCUTANEOUS | 0 refills | Status: DC

## 2023-06-29 MED ORDER — VITAMIN D (ERGOCALCIFEROL) 1.25 MG (50000 UNIT) PO CAPS: 50000.0000 [IU] | ORAL_CAPSULE | ORAL | 0 refills | Status: DC

## 2023-06-29 NOTE — Progress Notes (Signed)
 WEIGHT SUMMARY AND BIOMETRICS  Weight Lost Since Last Visit: 1lb  Weight Gained Since Last Visit: 0   Vitals Temp: 97.8 F (36.6 C) BP: (!) 146/82 Pulse Rate: 75 SpO2: 99 %   Anthropometric Measurements Height: 5\' 11"  (1.803 m) Weight: 243 lb (110.2 kg) BMI (Calculated): 33.91 Weight at Last Visit: 244lb Weight Lost Since Last Visit: 1lb Weight Gained Since Last Visit: 0 Starting Weight: 242lb Total Weight Loss (lbs): 0 lb (0 kg)   Body Composition  Body Fat %: 31.1 % Fat Mass (lbs): 75.8 lbs Muscle Mass (lbs): 159.8 lbs Total Body Water  (lbs): 115.8 lbs Visceral Fat Rating : 20   Other Clinical Data Fasting: no Labs: no Today's Visit #: 37 Starting Date: 07/04/20    OBESITY Marvin [name] is here to discuss his progress with his obesity treatment plan along with follow-up of his obesity related diagnoses.    Nutrition Plan: the Category 2 plan - 60% adherence.  Current exercise: walking/push ups and fishing   Interim History:  He is down 1 lb since his last visit. He is doing chair exercise.  Eating all of the food on the plan., Protein intake is as prescribed, Is not skipping meals, and Water  intake is adequate.   Pharmacotherapy: Norm is on Ozempic  2 mg SQ weekly Adverse side effects: None Hunger is moderately controlled.  Cravings are moderately controlled.  Assessment/Plan:   Vitamin D  Deficiency Vitamin D  is not at goal of 50.  Most recent vitamin D  level was 42.4. He is on  prescription ergocalciferol  50,000 IU weekly. Lab Results  Component Value Date   VD25OH 42.4 10/23/2020   VD25OH 7.3 (L) 07/04/2020    Plan: Refill prescription vitamin D  50,000 IU weekly.   Type II Diabetes HgbA1c is at goal. Last A1c was 6.0 Episodes of hypoglycemia: no Medication(s): Ozempic  2 mg SQ weekly  Lab Results  Component Value Date    HGBA1C 6.0 05/19/2023   HGBA1C 5.9 11/18/2022   HGBA1C 5.9 (H) 07/02/2022   Lab Results  Component Value Date   MICROALBUR <0.7 11/18/2022   LDLCALC 89 11/18/2022   CREATININE 1.10 05/19/2023   Lab Results  Component Value Date   GFR 68.12 05/19/2023   GFR 77.58 11/18/2022   GFR 64.38 05/12/2022    Plan: Continue and refill Ozempic  2 mg SQ weekly Continue all other medications.  Will keep all carbohydrates low both sweets and starches.  Will continue exercise regimen to 30 to 60 minutes on most days of the week.  Aim for 7 to 9 hours of sleep nightly.  Eat more low glycemic index foods.  He will cook more with his wife at home.  He will walk for 1 hour most days per week.     Generalized Obesity: Current BMI BMI (Calculated): 33.91   Pharmacotherapy Plan Continue and refill  Ozempic  2 mg SQ  weekly  Taevian is currently in the action stage of change. As such, his goal is to continue with weight loss efforts.  He has agreed to the Category 2 plan.  Exercise goals: Older adults should do exercises that maintain or improve balance if they are at risk of falling.   Behavioral modification strategies: increasing lean protein intake, no meal skipping, meal planning , increase water  intake, planning for success, and avoiding temptations.  Romero has agreed to follow-up with our clinic in 4 weeks.   Objective:   VITALS: Per patient if applicable, see vitals. GENERAL: Alert and in no acute distress. CARDIOPULMONARY: No increased WOB. Speaking in clear sentences.  PSYCH: Pleasant and cooperative. Speech normal rate and rhythm. Affect is appropriate. Insight and judgement are appropriate. Attention is focused, linear, and appropriate.  NEURO: Oriented as arrived to appointment on time with no prompting.   Attestation Statements:   This was prepared with the assistance of Engineer, civil (consulting).  Occasional wrong-word or sound-a-like substitutions may have occurred due to  the inherent limitations of voice recognition   Kirk Peper, DO

## 2023-07-24 NOTE — Patient Instructions (Signed)
 SURGICAL WAITING ROOM VISITATION  Patients having surgery or a procedure may have no more than 2 support people in the waiting area - these visitors may rotate.    Children under the age of 34 must have an adult with them who is not the patient.  Visitors with respiratory illnesses are discouraged from visiting and should remain at home.  If the patient needs to stay at the hospital during part of their recovery, the visitor guidelines for inpatient rooms apply. Pre-op nurse will coordinate an appropriate time for 1 support person to accompany patient in pre-op.  This support person may not rotate.    Please refer to the [facility] website for the visitor guidelines for Inpatients (after your surgery is over and you are in a regular room).       Your procedure is scheduled on: 08/07/23   Report to [facility] Main Entrance    Report to admitting at 5:15 AM   Call this number if you have problems the morning of surgery [phone]   Do not eat food :After Midnight.   After Midnight you may have the following liquids until 4:30 AM DAY OF SURGERY  Water  Non-Citrus Juices (without pulp, NO RED-Apple, White grape, White cranberry) Black Coffee (NO MILK/CREAM OR CREAMERS, sugar ok)  Clear Tea (NO MILK/CREAM OR CREAMERS, sugar ok) regular and decaf                             Plain Jell-O (NO RED)                                           Fruit ices (not with fruit pulp, NO RED)                                     Popsicles (NO RED)                                                               Sports drinks like Gatorade (NO RED)                 FOLLOW BOWEL PREP AND ANY ADDITIONAL PRE OP INSTRUCTIONS YOU RECEIVED FROM YOUR SURGEON'S OFFICE!!!     Oral Hygiene is also important to reduce your risk of infection.                                    Remember - BRUSH YOUR TEETH THE MORNING OF SURGERY WITH YOUR REGULAR TOOTHPASTE  DENTURES WILL BE REMOVED PRIOR TO SURGERY  PLEASE DO NOT APPLY "Poly grip" OR ADHESIVES!!!   Stop all vitamins and herbal supplements 7 days before surgery.   Take these medicines the morning of surgery with A SIP OF WATER : allopurinol , amlodipine , pantoprazole (protonix ), Simvastatin , tylenol  if needed.  DO NOT TAKE ANY ORAL DIABETIC MEDICATIONS DAY OF YOUR SURGERY Hold Ozempic  for at least 7 days prior to surgery.  You may not have any metal on your body including hair pins, jewelry, and body piercing             Do not wear make-up, lotions, powders, perfumes/cologne, or deodorant              Men may shave face and neck.   Do not bring valuables to the hospital. [facility] IS NOT             RESPONSIBLE   FOR VALUABLES.   Contacts, glasses, dentures or bridgework may not be worn into surgery.  DO NOT BRING YOUR HOME MEDICATIONS TO THE HOSPITAL. PHARMACY WILL DISPENSE MEDICATIONS LISTED ON YOUR MEDICATION LIST TO YOU DURING YOUR ADMISSION IN THE HOSPITAL!    Patients discharged on the day of surgery will not be allowed to drive home.  Someone NEEDS to stay with you for the first 24 hours after anesthesia.   Special Instructions: Bring a copy of your healthcare power of attorney and living will documents the day of surgery if you haven't scanned them before.              Please read over the following fact sheets you were given: IF YOU HAVE QUESTIONS ABOUT YOUR PRE-OP INSTRUCTIONS PLEASE CALL [phone] Marvin [name]   If you received a COVID test during your pre-op visit  it is requested that you wear a mask when out in public, stay away from anyone that may not be feeling well and notify your surgeon if you develop symptoms. If you test positive for Covid or have been in contact with anyone that has tested positive in the last 10 days please notify you surgeon.    [facility] - Preparing for Surgery Before surgery, you can play an important role.  Because skin is not sterile, your skin needs to be as free of germs  as possible.  You can reduce the number of germs on your skin by washing with CHG (chlorahexidine gluconate) soap before surgery.  CHG is an antiseptic cleaner which kills germs and bonds with the skin to continue killing germs even after washing. Please DO NOT use if you have an allergy to CHG or antibacterial soaps.  If your skin becomes reddened/irritated stop using the CHG and inform your nurse when you arrive at Short Stay. Do not shave (including legs and underarms) for at least 48 hours prior to the first CHG shower.  You may shave your face/neck.  Please follow these instructions carefully:  1.  Shower with CHG Soap the night before surgery and the  morning of surgery.  2.  If you choose to wash your hair, wash your hair first as usual with your normal  shampoo.  3.  After you shampoo, rinse your hair and body thoroughly to remove the shampoo.                             4.  Use CHG as you would any other liquid soap.  You can apply chg directly to the skin and wash.  Gently with a scrungie or clean washcloth.  5.  Apply the CHG Soap to your body ONLY FROM THE NECK DOWN.   Do   not use on face/ open                           Wound or open sores. Avoid contact with eyes, ears  mouth and   genitals (private parts).                       Wash face,  Genitals (private parts) with your normal soap.             6.  Wash thoroughly, paying special attention to the area where your    surgery  will be performed.  7.  Thoroughly rinse your body with warm water  from the neck down.  8.  DO NOT shower/wash with your normal soap after using and rinsing off the CHG Soap.                9.  Pat yourself dry with a clean towel.            10.  Wear clean pajamas.            11.  Place clean sheets on your bed the night of your first shower and do not  sleep with pets. Day of Surgery : Do not apply any lotions/deodorants the morning of surgery.  Please wear clean clothes to the hospital/surgery  center.  FAILURE TO FOLLOW THESE INSTRUCTIONS MAY RESULT IN THE CANCELLATION OF YOUR SURGERY  PATIENT SIGNATURE_________________________________  NURSE SIGNATURE__________________________________  ________________________________________________________________________

## 2023-07-24 NOTE — Progress Notes (Addendum)
 COVID Vaccine received:  []  No [x]  Yes Date of any COVID positive Test in last 90 days: no PCP - Kathrene Parents NP Cardiologist - n/a  Chest x-ray -  EKG -  05/19/23 EPIC Stress Test -  ECHO -  Cardiac Cath -   Bowel Prep - [x]  No  []   Yes ______  Pacemaker / ICD device [x]  No []  Yes   Spinal Cord Stimulator:[x]  No []  Yes       History of Sleep Apnea? [x]  No []  Yes   CPAP used?- [x]  No []  Yes    Does the patient monitor blood sugar?          [x]  No []  Yes  []  N/A  Patient has: [x]  NO Hx DM   []  Pre-DM                 []  DM1  []   DM2 Does patient have a [employer] or Dexacom? []  No []  Yes   Fasting Blood Sugar Ranges-  Checks Blood Sugar _____ times a day  GLP1 agonist / usual dose - Ozempic  on Mondays. Last dose 07/27/23 GLP1 instructions:  SGLT-2 inhibitors / usual dose -  SGLT-2 instructions:   Blood Thinner / Instructions:no Aspirin  Instructions:ASA 81 mg was told he did not need to stop.  Comments:   Activity level: Patient is able  to climb a flight of stairs without difficulty; [x]  No CP  [x]  No SOB,   Patient can  perform ADLs without assistance.   Anesthesia review:   Patient denies shortness of breath, fever, cough and chest pain at PAT appointment.  Patient verbalized understanding and agreement to the Pre-Surgical Instructions that were given to them at this PAT appointment. Patient was also educated of the need to review these PAT instructions again prior to his/her surgery.I reviewed the appropriate phone numbers to call if they have any and questions or concerns.

## 2023-07-27 ENCOUNTER — Encounter (HOSPITAL_COMMUNITY)
Admission: RE | Admit: 2023-07-27 | Discharge: 2023-07-27 | Disposition: A | Discharge status: Home / Self Care | Source: Ambulatory Visit | Attending: Surgery | Admitting: Surgery

## 2023-07-27 ENCOUNTER — Encounter (HOSPITAL_COMMUNITY): Payer: Self-pay

## 2023-07-27 ENCOUNTER — Other Ambulatory Visit: Payer: Self-pay

## 2023-07-27 VITALS — BP 132/89 | HR 64 | Temp 98.2°F | Resp 16 | Ht 71.0 in | Wt 245.0 lb

## 2023-07-27 DIAGNOSIS — I10 Essential (primary) hypertension: Secondary | ICD-10-CM | POA: Diagnosis not present

## 2023-07-27 DIAGNOSIS — Z01818 Encounter for other preprocedural examination: Secondary | ICD-10-CM

## 2023-07-27 DIAGNOSIS — Z01812 Encounter for preprocedural laboratory examination: Secondary | ICD-10-CM | POA: Insufficient documentation

## 2023-07-27 LAB — CBC
HCT: 46.2 % (ref 39.0–52.0)
Hemoglobin: 15.1 g/dL (ref 13.0–17.0)
MCH: 28.3 pg (ref 26.0–34.0)
MCHC: 32.7 g/dL (ref 30.0–36.0)
MCV: 86.7 fL (ref 80.0–100.0)
Platelets: 200 10*3/uL (ref 150–400)
RBC: 5.33 MIL/uL (ref 4.22–5.81)
RDW: 14.4 % (ref 11.5–15.5)
WBC: 9.5 10*3/uL (ref 4.0–10.5)
nRBC: 0 % (ref 0.0–0.2)

## 2023-07-27 LAB — BASIC METABOLIC PANEL WITH GFR
Anion gap: 9 (ref 5–15)
BUN: 13 mg/dL (ref 8–23)
CO2: 25 mmol/L (ref 22–32)
Calcium: 9.3 mg/dL (ref 8.9–10.3)
Chloride: 105 mmol/L (ref 98–111)
Creatinine, Ser: 1.06 mg/dL (ref 0.61–1.24)
GFR, Estimated: >60 mL/min (ref >60)
Glucose, Bld: 93 mg/dL (ref 70–99)
Potassium: 4.5 mmol/L (ref 3.5–5.1)
Sodium: 139 mmol/L (ref 135–145)

## 2023-08-03 ENCOUNTER — Encounter: Payer: Self-pay | Admitting: Bariatrics

## 2023-08-03 ENCOUNTER — Ambulatory Visit (INDEPENDENT_AMBULATORY_CARE_PROVIDER_SITE_OTHER): Admitting: Bariatrics

## 2023-08-03 VITALS — BP 130/86 | HR 69 | Temp 97.9°F | Ht 71.0 in | Wt 240.0 lb

## 2023-08-03 DIAGNOSIS — E785 Hyperlipidemia, unspecified: Secondary | ICD-10-CM | POA: Diagnosis not present

## 2023-08-03 DIAGNOSIS — Z7985 Long-term (current) use of injectable non-insulin antidiabetic drugs: Secondary | ICD-10-CM | POA: Diagnosis not present

## 2023-08-03 DIAGNOSIS — Z6833 Body mass index (BMI) 33.0-33.9, adult: Secondary | ICD-10-CM | POA: Diagnosis not present

## 2023-08-03 DIAGNOSIS — E1169 Type 2 diabetes mellitus with other specified complication: Secondary | ICD-10-CM

## 2023-08-03 DIAGNOSIS — E782 Mixed hyperlipidemia: Secondary | ICD-10-CM

## 2023-08-03 DIAGNOSIS — E669 Obesity, unspecified: Secondary | ICD-10-CM | POA: Diagnosis not present

## 2023-08-03 DIAGNOSIS — E66811 Obesity, class 1: Secondary | ICD-10-CM

## 2023-08-03 MED ORDER — SEMAGLUTIDE (2 MG/DOSE) 8 MG/3ML ~~LOC~~ SOPN: 2.0000 mg | PEN_INJECTOR | SUBCUTANEOUS | 0 refills | Status: DC

## 2023-08-03 NOTE — Progress Notes (Signed)
 WEIGHT SUMMARY AND BIOMETRICS  Weight Lost Since Last Visit: 3lb  Weight Gained Since Last Visit: 0   Vitals Temp: 97.9 F (36.6 C) BP: 130/86 Pulse Rate: 69 SpO2: 99 %   Anthropometric Measurements Height: 5' 11 (1.803 m) Weight: 240 lb (108.9 kg) BMI (Calculated): 33.49 Weight at Last Visit: 243lb Weight Lost Since Last Visit: 3lb Weight Gained Since Last Visit: 0 Starting Weight: 242lb Total Weight Loss (lbs): 2 lb (0.907 kg)   Body Composition  Body Fat %: 30.8 % Fat Mass (lbs): 74.2 lbs Muscle Mass (lbs): 158.2 lbs Total Body Water  (lbs): 113.2 lbs Visceral Fat Rating : 19   Other Clinical Data Fasting: no Labs: no Today's Visit #: 17 Starting Date: 07/04/20    OBESITY Marvin [name] is here to discuss his progress with his obesity treatment plan along with follow-up of his obesity related diagnoses.    Nutrition Plan: the Category 4 plan - 70% adherence.  Current exercise: swimming and chair exercises.  Interim History:  He is down 3 lbs since his last visit. He has been fishing and other activities.  Eating all of the food on the plan., Protein intake is as prescribed, Is not skipping meals, and Water  intake is adequate.   Pharmacotherapy: Marvin [name] is on Ozempic  2 mg SQ weekly Adverse side effects: None Hunger is moderately controlled.  Cravings are moderately controlled.  Assessment/Plan:   Type II Diabetes HgbA1c is at goal. Last A1c was 6.0 CBGs: Not checking      Episodes of hypoglycemia: no Medication(s): Ozempic  2 mg SQ weekly  Lab Results  Component Value Date   HGBA1C 6.0 05/19/2023   HGBA1C 5.9 11/18/2022   HGBA1C 5.9 (H) 07/02/2022   Lab Results  Component Value Date   MICROALBUR <0.7 11/18/2022   LDLCALC 89 11/18/2022   CREATININE 1.06 07/27/2023   Lab Results  Component Value Date   GFR 68.12 05/19/2023   GFR  77.58 11/18/2022   GFR 64.38 05/12/2022    Plan: Continue and refill Ozempic  2 mg SQ weekly Continue all other medications.  Will keep all carbohydrates low both sweets and starches.  Will continue exercise regimen to 30 to 60 minutes on most days of the week.  Aim for 7 to 9 hours of sleep nightly.  Eat more low glycemic index foods.   Hyperlipidemia LDL is not at goal. Medication(s): Zocor   Cardiovascular risk factors: diabetes mellitus, dyslipidemia, hypertension, obesity (BMI >= 30 kg/m2), and sedentary lifestyle  Lab Results  Component Value Date   CHOL 148 11/18/2022   HDL 41.70 11/18/2022   LDLCALC 89 11/18/2022   LDLDIRECT 128.0 05/19/2023   TRIG 89.0 11/18/2022   CHOLHDL 4 11/18/2022   Lab Results  Component Value Date   ALT 18 05/19/2023   AST 22 05/19/2023   ALKPHOS 71 05/19/2023   BILITOT 0.7 05/19/2023   The  10-year ASCVD risk score (Arnett DK, et al., 2019) is: 32.9%   Values used to calculate the score:     Age: 73 years     Clincally relevant sex: Male     Is Non-Hispanic African American: Yes     Diabetic: Yes     Tobacco smoker: No     Systolic Blood Pressure: 130 mmHg     Is BP treated: Yes     HDL Cholesterol: 41.7 mg/dL     Total Cholesterol: 148 mg/dL  Plan:  Continue statin.  Will avoid all trans fats.  Will read labels Will minimize saturated fats except the following: low fat meats in moderation, diary, and limited dark chocolate.  Increase Omega 3 in foods, and consider an Omega 3 supplement.    Generalized Obesity: Current BMI BMI (Calculated): 33.49   Pharmacotherapy Plan Continue and refill  Ozempic  1 mg SQ weekly  Marvin [name] is currently in the action stage of change. As such, his goal is to continue with weight loss efforts.  He has agreed to the Category 2 plan.  Exercise goals: Older adults should do exercises that maintain or improve balance if they are at risk of falling.  He will continue to face and to do his chair  exercises.  After surgery he will walk for a while until his surgeon says that he can start other exercises.  Behavioral modification strategies: increasing lean protein intake, no meal skipping, meal planning , increase water  intake, better snacking choices, planning for success, increasing vegetables, keep healthy foods in the home, weigh protein portions, measure portion sizes, and mindful eating.  Marvin [name] has agreed to follow-up with our clinic in 5 weeks.     Objective:   VITALS: Per patient if applicable, see vitals. GENERAL: Alert and in no acute distress. CARDIOPULMONARY: No increased WOB. Speaking in clear sentences.  PSYCH: Pleasant and cooperative. Speech normal rate and rhythm. Affect is appropriate. Insight and judgement are appropriate. Attention is focused, linear, and appropriate.  NEURO: Oriented as arrived to appointment on time with no prompting.   Attestation Statements:   This was prepared with the assistance of Engineer, civil (consulting).  Occasional wrong-word or sound-a-like substitutions may have occurred due to the inherent limitations of voice recognition   Marvin Peper, DO

## 2023-08-06 NOTE — Anesthesia Preprocedure Evaluation (Signed)
 Anesthesia Evaluation  Patient identified by MRN, date of birth, ID band Patient awake    Reviewed: Allergy & Precautions, H&P , NPO status , Patient's Chart, lab work & pertinent test results  Airway Mallampati: III  TM Distance: >3 FB Neck ROM: Full    Dental no notable dental hx. (+) Missing, Teeth Intact   Pulmonary neg pulmonary ROS, former smoker   Pulmonary exam normal breath sounds clear to auscultation       Cardiovascular Exercise Tolerance: Good hypertension, Pt. on medications  Rhythm:Regular Rate:Normal     Neuro/Psych negative neurological ROS  negative psych ROS   GI/Hepatic Neg liver ROS, PUD,,,  Endo/Other  diabetes, Type 2    Renal/GU negative Renal ROS  negative genitourinary   Musculoskeletal  (+) Arthritis ,    Abdominal   Peds  Hematology negative hematology ROS (+)   Anesthesia Other Findings   Reproductive/Obstetrics negative OB ROS                             Anesthesia Physical Anesthesia Plan  ASA: 2  Anesthesia Plan: General   Post-op Pain Management: Ofirmev  IV (intra-op)*   Induction: Intravenous  PONV Risk Score and Plan: 3 and Ondansetron , Dexamethasone  and Treatment may vary due to age or medical condition  Airway Management Planned: Oral ETT  Additional Equipment:   Intra-op Plan:   Post-operative Plan: Extubation in OR  Informed Consent: I have reviewed the patients History and Physical, chart, labs and discussed the procedure including the risks, benefits and alternatives for the proposed anesthesia with the patient or authorized representative who has indicated his/her understanding and acceptance.     Dental advisory given  Plan Discussed with: CRNA  Anesthesia Plan Comments:        Anesthesia Quick Evaluation

## 2023-08-07 ENCOUNTER — Ambulatory Visit (HOSPITAL_COMMUNITY): Admitting: Anesthesiology

## 2023-08-07 ENCOUNTER — Encounter (HOSPITAL_COMMUNITY): Payer: Self-pay | Admitting: Surgery

## 2023-08-07 ENCOUNTER — Other Ambulatory Visit: Payer: Self-pay

## 2023-08-07 ENCOUNTER — Encounter (HOSPITAL_COMMUNITY)
Admission: RE | Disposition: A | Discharge status: Home / Self Care | Payer: Self-pay | Source: Home / Self Care | Attending: Surgery

## 2023-08-07 ENCOUNTER — Ambulatory Visit (HOSPITAL_COMMUNITY)
Admission: RE | Admit: 2023-08-07 | Discharge: 2023-08-07 | Disposition: A | Discharge status: Home / Self Care | Attending: Surgery | Admitting: Surgery

## 2023-08-07 DIAGNOSIS — I10 Essential (primary) hypertension: Secondary | ICD-10-CM | POA: Diagnosis not present

## 2023-08-07 DIAGNOSIS — Z6833 Body mass index (BMI) 33.0-33.9, adult: Secondary | ICD-10-CM | POA: Diagnosis not present

## 2023-08-07 DIAGNOSIS — K660 Peritoneal adhesions (postprocedural) (postinfection): Secondary | ICD-10-CM | POA: Diagnosis not present

## 2023-08-07 DIAGNOSIS — D176 Benign lipomatous neoplasm of spermatic cord: Secondary | ICD-10-CM | POA: Insufficient documentation

## 2023-08-07 DIAGNOSIS — Z7985 Long-term (current) use of injectable non-insulin antidiabetic drugs: Secondary | ICD-10-CM | POA: Insufficient documentation

## 2023-08-07 DIAGNOSIS — K4091 Unilateral inguinal hernia, without obstruction or gangrene, recurrent: Secondary | ICD-10-CM

## 2023-08-07 DIAGNOSIS — K4090 Unilateral inguinal hernia, without obstruction or gangrene, not specified as recurrent: Secondary | ICD-10-CM | POA: Diagnosis not present

## 2023-08-07 DIAGNOSIS — K430 Incisional hernia with obstruction, without gangrene: Secondary | ICD-10-CM | POA: Diagnosis present

## 2023-08-07 DIAGNOSIS — Z87891 Personal history of nicotine dependence: Secondary | ICD-10-CM | POA: Insufficient documentation

## 2023-08-07 DIAGNOSIS — E66811 Obesity, class 1: Secondary | ICD-10-CM | POA: Diagnosis not present

## 2023-08-07 DIAGNOSIS — Z8711 Personal history of peptic ulcer disease: Secondary | ICD-10-CM | POA: Diagnosis not present

## 2023-08-07 DIAGNOSIS — K4030 Unilateral inguinal hernia, with obstruction, without gangrene, not specified as recurrent: Secondary | ICD-10-CM | POA: Insufficient documentation

## 2023-08-07 DIAGNOSIS — K429 Umbilical hernia without obstruction or gangrene: Secondary | ICD-10-CM | POA: Insufficient documentation

## 2023-08-07 DIAGNOSIS — E119 Type 2 diabetes mellitus without complications: Secondary | ICD-10-CM | POA: Diagnosis not present

## 2023-08-07 DIAGNOSIS — K5909 Other constipation: Secondary | ICD-10-CM | POA: Diagnosis not present

## 2023-08-07 DIAGNOSIS — K432 Incisional hernia without obstruction or gangrene: Secondary | ICD-10-CM | POA: Diagnosis not present

## 2023-08-07 HISTORY — PX: INCISIONAL HERNIA REPAIR: SHX193

## 2023-08-07 HISTORY — PX: REPAIR, HERNIA, INGUINAL, BILATERAL, ROBOT-ASSISTED: SHX7636

## 2023-08-07 LAB — GLUCOSE, CAPILLARY
Glucose-Capillary: 98 mg/dL (ref 70–99)
Glucose-Capillary: 105 mg/dL — ABNORMAL HIGH (ref 70–99)
Glucose-Capillary: 141 mg/dL — ABNORMAL HIGH (ref 70–99)

## 2023-08-07 SURGERY — REPAIR, HERNIA, INGUINAL, BILATERAL, ROBOT-ASSISTED
Anesthesia: General

## 2023-08-07 MED ORDER — ALBUMIN HUMAN 5 % IV SOLN
INTRAVENOUS | Status: AC
  Filled 2023-08-07: qty 250

## 2023-08-07 MED ORDER — PHENYLEPHRINE 80 MCG/ML (10ML) SYRINGE FOR IV PUSH (FOR BLOOD PRESSURE SUPPORT)
PREFILLED_SYRINGE | INTRAVENOUS | Status: DC | PRN
  Administered 2023-08-07: 80 ug via INTRAVENOUS

## 2023-08-07 MED ORDER — 0.9 % SODIUM CHLORIDE (POUR BTL) OPTIME
TOPICAL | Status: DC | PRN
  Administered 2023-08-07: 1000 mL

## 2023-08-07 MED ORDER — LIDOCAINE HCL (PF) 2 % IJ SOLN
INTRAMUSCULAR | Status: DC | PRN
  Administered 2023-08-07: 60 mg via INTRADERMAL

## 2023-08-07 MED ORDER — ONDANSETRON HCL 4 MG/2ML IJ SOLN
INTRAMUSCULAR | Status: AC
Start: 2023-08-07 — End: 2023-08-07
  Filled 2023-08-07: qty 2

## 2023-08-07 MED ORDER — BUPIVACAINE-EPINEPHRINE (PF) 0.25% -1:200000 IJ SOLN
INTRAMUSCULAR | Status: AC
  Filled 2023-08-07: qty 30

## 2023-08-07 MED ORDER — GABAPENTIN 300 MG PO CAPS
300.0000 mg | ORAL_CAPSULE | ORAL | Status: AC
  Administered 2023-08-07: 300 mg via ORAL
  Filled 2023-08-07: qty 1

## 2023-08-07 MED ORDER — DEXAMETHASONE SODIUM PHOSPHATE 10 MG/ML IJ SOLN
INTRAMUSCULAR | Status: AC
  Filled 2023-08-07: qty 1

## 2023-08-07 MED ORDER — CEFAZOLIN SODIUM-DEXTROSE 2-4 GM/100ML-% IV SOLN
2.0000 g | INTRAVENOUS | Status: AC
  Administered 2023-08-07: 2 g via INTRAVENOUS
  Filled 2023-08-07: qty 100

## 2023-08-07 MED ORDER — PROPOFOL 10 MG/ML IV BOLUS
INTRAVENOUS | Status: DC | PRN
  Administered 2023-08-07: 150 mg via INTRAVENOUS

## 2023-08-07 MED ORDER — DEXAMETHASONE SODIUM PHOSPHATE 10 MG/ML IJ SOLN
4.0000 mg | INTRAMUSCULAR | Status: AC
  Administered 2023-08-07: 8 mg via INTRAVENOUS

## 2023-08-07 MED ORDER — ALBUMIN HUMAN 5 % IV SOLN: INTRAVENOUS | Status: DC | PRN

## 2023-08-07 MED ORDER — CHLORHEXIDINE GLUCONATE CLOTH 2 % EX PADS: 6.0000 | MEDICATED_PAD | Freq: Once | CUTANEOUS | Status: DC

## 2023-08-07 MED ORDER — ACETAMINOPHEN 500 MG PO TABS
1000.0000 mg | ORAL_TABLET | ORAL | Status: DC
  Filled 2023-08-07: qty 2

## 2023-08-07 MED ORDER — BUPIVACAINE-EPINEPHRINE 0.25% -1:200000 IJ SOLN
INTRAMUSCULAR | Status: DC | PRN
  Administered 2023-08-07: 80 mL

## 2023-08-07 MED ORDER — STERILE WATER FOR IRRIGATION IR SOLN
Status: DC | PRN
  Administered 2023-08-07: 1000 mL

## 2023-08-07 MED ORDER — ONDANSETRON HCL 4 MG/2ML IJ SOLN
INTRAMUSCULAR | Status: DC | PRN
  Administered 2023-08-07: 4 mg via INTRAVENOUS

## 2023-08-07 MED ORDER — ROCURONIUM BROMIDE 10 MG/ML (PF) SYRINGE
PREFILLED_SYRINGE | INTRAVENOUS | Status: DC | PRN
  Administered 2023-08-07: 70 mg via INTRAVENOUS
  Administered 2023-08-07 (×4): 20 mg via INTRAVENOUS

## 2023-08-07 MED ORDER — TRAMADOL HCL 50 MG PO TABS: 50.0000 mg | ORAL_TABLET | Freq: Four times a day (QID) | ORAL | 0 refills | Status: DC | PRN

## 2023-08-07 MED ORDER — ROCURONIUM BROMIDE 10 MG/ML (PF) SYRINGE
PREFILLED_SYRINGE | INTRAVENOUS | Status: AC
  Filled 2023-08-07: qty 10

## 2023-08-07 MED ORDER — FENTANYL CITRATE (PF) 250 MCG/5ML IJ SOLN
INTRAMUSCULAR | Status: DC | PRN
  Administered 2023-08-07 (×5): 50 ug via INTRAVENOUS

## 2023-08-07 MED ORDER — ORAL CARE MOUTH RINSE: 15.0000 mL | Freq: Once | OROMUCOSAL | Status: AC

## 2023-08-07 MED ORDER — HYDROMORPHONE HCL 1 MG/ML IJ SOLN: 0.2500 mg | INTRAMUSCULAR | Status: DC | PRN

## 2023-08-07 MED ORDER — PROPOFOL 10 MG/ML IV BOLUS
INTRAVENOUS | Status: AC
  Filled 2023-08-07: qty 20

## 2023-08-07 MED ORDER — LIDOCAINE HCL (PF) 2 % IJ SOLN
INTRAMUSCULAR | Status: AC
  Filled 2023-08-07: qty 5

## 2023-08-07 MED ORDER — CYCLOBENZAPRINE HCL 5 MG PO TABS
5.0000 mg | ORAL_TABLET | Freq: Three times a day (TID) | ORAL | 2 refills | Status: AC | PRN
Start: 2023-08-07 — End: ?

## 2023-08-07 MED ORDER — PHENYLEPHRINE HCL-NACL 20-0.9 MG/250ML-% IV SOLN
INTRAVENOUS | Status: DC | PRN
  Administered 2023-08-07: 35 ug/min via INTRAVENOUS

## 2023-08-07 MED ORDER — BUPIVACAINE LIPOSOME 1.3 % IJ SUSP: 20.0000 mL | Freq: Once | INTRAMUSCULAR | Status: DC

## 2023-08-07 MED ORDER — SUGAMMADEX SODIUM 200 MG/2ML IV SOLN
INTRAVENOUS | Status: DC | PRN
Start: 2023-08-07 — End: 2023-08-07
  Administered 2023-08-07: 200 mg via INTRAVENOUS

## 2023-08-07 MED ORDER — BUPIVACAINE LIPOSOME 1.3 % IJ SUSP
INTRAMUSCULAR | Status: AC
  Filled 2023-08-07: qty 20

## 2023-08-07 MED ORDER — FENTANYL CITRATE (PF) 250 MCG/5ML IJ SOLN
INTRAMUSCULAR | Status: AC
  Filled 2023-08-07: qty 5

## 2023-08-07 MED ORDER — CHLORHEXIDINE GLUCONATE 0.12 % MT SOLN
15.0000 mL | Freq: Once | OROMUCOSAL | Status: AC
  Administered 2023-08-07: 15 mL via OROMUCOSAL

## 2023-08-07 MED ORDER — LACTATED RINGERS IV SOLN: INTRAVENOUS | Status: DC

## 2023-08-07 SURGICAL SUPPLY — 41 items
APPLICATOR COTTON TIP 6 STRL (MISCELLANEOUS) ×6 IMPLANT
BAG COUNTER SPONGE SURGICOUNT (BAG) IMPLANT
BLADE SURG SZ11 CARB STEEL (BLADE) ×3 IMPLANT
CHLORAPREP W/TINT 26 (MISCELLANEOUS) ×3 IMPLANT
COVER SURGICAL LIGHT HANDLE (MISCELLANEOUS) ×3 IMPLANT
COVER TIP SHEARS 8 DVNC (MISCELLANEOUS) ×3 IMPLANT
DRAPE ARM DVNC X/XI (DISPOSABLE) ×12 IMPLANT
DRAPE COLUMN DVNC XI (DISPOSABLE) ×3 IMPLANT
DRIVER NDL MEGA SUTCUT DVNCXI (INSTRUMENTS) ×6 IMPLANT
DRIVER NDLE MEGA SUTCUT DVNCXI (INSTRUMENTS) ×4 IMPLANT
DRSG TEGADERM 2-3/8X2-3/4 SM (GAUZE/BANDAGES/DRESSINGS) IMPLANT
ELECT REM PT RETURN 15FT ADLT (MISCELLANEOUS) ×3 IMPLANT
GAUZE SPONGE 2X2 8PLY STRL LF (GAUZE/BANDAGES/DRESSINGS) IMPLANT
GLOVE ECLIPSE 8.0 STRL XLNG CF (GLOVE) ×6 IMPLANT
GLOVE INDICATOR 8.0 STRL GRN (GLOVE) ×6 IMPLANT
GOWN STRL REUS W/ TWL XL LVL3 (GOWN DISPOSABLE) ×6 IMPLANT
IRRIGATION SUCT STRKRFLW 2 WTP (MISCELLANEOUS) IMPLANT
KIT BASIN OR (CUSTOM PROCEDURE TRAY) ×3 IMPLANT
KIT TURNOVER KIT A (KITS) ×3 IMPLANT
MESH HERNIA 6X6 BARD (Mesh General) IMPLANT
NDL HYPO 22X1.5 SAFETY MO (MISCELLANEOUS) ×3 IMPLANT
NDL INSUFFLATION 14GA 120MM (NEEDLE) ×3 IMPLANT
NEEDLE HYPO 22X1.5 SAFETY MO (MISCELLANEOUS) ×2 IMPLANT
NEEDLE INSUFFLATION 14GA 120MM (NEEDLE) ×2 IMPLANT
PACK CARDIOVASCULAR III (CUSTOM PROCEDURE TRAY) ×3 IMPLANT
PAD POSITIONING PINK XL (MISCELLANEOUS) ×3 IMPLANT
SCISSORS MNPLR CVD DVNC XI (INSTRUMENTS) ×3 IMPLANT
SEAL UNIV 5-12 XI (MISCELLANEOUS) ×9 IMPLANT
SOLUTION ELECTROSURG ANTI STCK (MISCELLANEOUS) ×3 IMPLANT
SPIKE FLUID TRANSFER (MISCELLANEOUS) ×3 IMPLANT
SUT MNCRL AB 4-0 PS2 18 (SUTURE) ×3 IMPLANT
SUT PDS AB 1 CT1 27 (SUTURE) IMPLANT
SUT STRATA 2-0 15 CT-1.5 (SUTURE) IMPLANT
SUT STRATAFIX SPIRAL PDS3-0 (SUTURE) IMPLANT
SUT VIC AB 3-0 SH 27XBRD (SUTURE) IMPLANT
SUTURE STRAT PDS 2-0 30 CT-1.5 (SUTURE) IMPLANT
SYR 10ML LL (SYRINGE) ×3 IMPLANT
SYR 20ML LL LF (SYRINGE) ×3 IMPLANT
TOWEL OR 17X26 10 PK STRL BLUE (TOWEL DISPOSABLE) ×3 IMPLANT
TROCAR ADV FIXATION 5X100MM (TROCAR) IMPLANT
TUBING INSUFFLATION 10FT LAP (TUBING) ×3 IMPLANT

## 2023-08-07 NOTE — Transfer of Care (Signed)
 Immediate Anesthesia Transfer of Care Note  Patient: Marvin [name]  Procedure(s) Performed: REPAIR, HERNIA, INGUINAL, BILATERAL, ROBOT-ASSISTED (Left) REPAIR, HERNIA, INCISIONAL  Patient Location: PACU  Anesthesia Type:General  Level of Consciousness: awake, alert , and oriented  Airway & Oxygen Therapy: Patient Spontanous Breathing and Patient connected to face mask oxygen  Post-op Assessment: Report given to RN and Post -op Vital signs reviewed and stable  Post vital signs: Reviewed and stable  Last Vitals:  Vitals Value Taken Time  BP 142/87 08/07/23 10:47  Temp    Pulse 75 08/07/23 10:50  Resp 26 08/07/23 10:50  SpO2 100 % 08/07/23 10:50  Vitals shown include unfiled device data.  Last Pain:  Vitals:   08/07/23 0533  TempSrc: Oral         Complications: No notable events documented.

## 2023-08-07 NOTE — Anesthesia Postprocedure Evaluation (Signed)
 Anesthesia Post Note  Patient: MALIK PAAR  Procedure(s) Performed: REPAIR, HERNIA, INGUINAL, BILATERAL, ROBOT-ASSISTED (Left) REPAIR, HERNIA, INCISIONAL     Patient location during evaluation: PACU Anesthesia Type: General Level of consciousness: awake and alert Pain management: pain level controlled Vital Signs Assessment: post-procedure vital signs reviewed and stable Respiratory status: spontaneous breathing, nonlabored ventilation and respiratory function stable Cardiovascular status: blood pressure returned to baseline and stable Postop Assessment: no apparent nausea or vomiting Anesthetic complications: no  No notable events documented.  Last Vitals:  Vitals:   08/07/23 1120 08/07/23 1136  BP: 120/80 (!) 142/90  Pulse: 77 79  Resp: 16 18  Temp:  36.6 C  SpO2: 96% 94%    Last Pain:  Vitals:   08/07/23 1136  TempSrc:   PainSc: 0-No pain                 Sheriann Newmann,W. EDMOND

## 2023-08-07 NOTE — H&P (Signed)
 08/07/2023    REFERRING PHYSICIAN: Nche, Connye Delaine, NP  Patient Care Team: Nche, Connye Delaine, NP as PCP - General (Internal Medicine)  PROVIDER: Girtha Lama, MD  [facility] MRN: [id number] DOB: [birth year]  SUBJECTIVE   Chief Complaint: NEW PROBLEM (HERNIA/)   Marvin [name] is a 73 y.o. male  who is seen today as an office consultation  at the request of DrEthelyn Herbert  for evaluation of recurrent recurrent left inguinal hernia.  History of Present Illness:  73 year old obese male. Had left inguinal hernia repaired robotically by Dr. Gaylyn Keas in 2023 with Bard 3D max mesh. Developed recurrence and underwent open repair with onlay Marlex mesh May 2024. Dr. Gaylyn Keas has since retired. Went back to [employer]. Noticed some bulging discomfort last month. Saw our office PA. She was concerned and CT scan shows evidence of fat-containing recurrent recurrent left inguinal hernia. Internal surgical evaluation requested. Patient also had a periumbilical ventral hernias repaired laparoscopic with mesh by Dr. Mammie Sears in 2012    Medical History:  Past Medical History:  Diagnosis Date  Arthritis  GERD (gastroesophageal reflux disease)  Hypertension  Substance abuse (CMS/HHS-HCC)   Patient Active Problem List  Diagnosis  Acid reflux  Chronic bilateral low back pain without sciatica  Diverticular disease of colon  Gout  Hyperuricemia  HTN (hypertension)  Hyperlipidemia  Primary osteoarthritis of right hip  Status post total replacement of right hip  Type 2 diabetes mellitus without complication, without long-term current use of insulin  (CMS/HHS-HCC)  Ventral hernia without obstruction or gangrene  Vitamin D  deficiency  Colon cancer screening  Constipation  Morbid obesity (CMS/HHS-HCC)  DM (diabetes mellitus) (CMS/HHS-HCC)  Myalgia due to statin  S/P hernia repair   Past Surgical History:  Procedure Laterality Date  HERNIA REPAIR  JOINT REPLACEMENT  Knee Surgery  Rotator  Cuff Surgery    Allergies  Allergen Reactions  Buprenorphine Hcl Itching  Morphine Itching   Current Outpatient Medications on File Prior to Visit  Medication Sig Dispense Refill  allopurinoL  (ZYLOPRIM ) 100 MG tablet  amLODIPine  (NORVASC ) 10 MG tablet  aspirin  81 MG EC tablet Take by mouth  ergocalciferol , vitamin D2, 1,250 mcg (50,000 unit) capsule  fluticasone  propionate (FLONASE ) 50 mcg/actuation nasal spray  oxyCODONE  (ROXICODONE ) 5 MG immediate release tablet Take 1 tablet (5 mg total) by mouth every 4 (four) hours as needed for Pain 10 tablet 0  OZEMPIC  0.25 mg or 0.5 mg(2 mg/1.5 mL) pen injector  simvastatin  (ZOCOR ) 20 MG tablet   No current facility-administered medications on file prior to visit.   History reviewed. No pertinent family history.   Social History   Tobacco Use  Smoking Status Former  Smokeless Tobacco Never    Social History   Socioeconomic History  Marital status: Married  Tobacco Use  Smoking status: Former  Smokeless tobacco: Never  Vaping Use  Vaping status: Never Used  Substance and Sexual Activity  Alcohol  use: Not Currently  Comment: Quit 05/1998  Drug use: Not Currently  Comment: Quit - cocaine/marijuana 05/1998   Social Drivers of Health   Financial Resource Strain: Low Risk (12/01/2022)  Received [facility]  Overall Financial Resource Strain (CARDIA)  Difficulty of Paying Living Expenses: Not hard at all  Food Insecurity: No Food Insecurity (12/01/2022)  Received from [facility]  Hunger Vital Sign  Worried About Running Out of Food in the Last Year: Never true  Ran Out of Food in the Last Year: Never true  Transportation Needs: No Transportation Needs (12/01/2022)  Received from [facility] - Transportation  Lack of Transportation (Medical): No  Lack of Transportation (Non-Medical): No  Physical Activity: Sufficiently Active (12/01/2022)  Received from [facility]  Exercise Vital Sign  Days of Exercise  per Week: 5 days  Minutes of Exercise per Session: 120 min  Stress: No Stress Concern Present (12/01/2022)  Received from [facility] - Occupational Stress Questionnaire  Feeling of Stress : Not at all  Social Connections: Socially Integrated (12/01/2022)  Received from [facility]  Social Connection and Isolation Panel [NHANES]  Frequency of Communication with Friends and Family: Three times a week  Frequency of Social Gatherings with Friends and Family: Once a week  Attends Religious Services: More than 4 times per year  Active Member of [employer] or Organizations: Yes  Attends Banker Meetings: 1 to 4 times per year  Marital Status: Married  Housing Stability: Unknown (04/03/2023)  Housing Stability Vital Sign  Homeless in the Last Year: No   ############################################################  Review of Systems: A complete review of systems (ROS) was obtained from the patient.  We have reviewed this information and discussed as appropriate with the patient.  See HPI as well for other pertinent ROS.  Constitutional: No fevers, chills, sweats. Weight stable Eyes: No vision changes, No discharge HENT: No sore throats, nasal drainage Lymph: No neck swelling, No bruising easily Pulmonary: No cough, productive sputum CV: No orthopnea, PND . No exertional chest/neck/shoulder/arm pain. Patient can walk 30 minutes without difficulty.   GI: No personal nor family history of GI/colon cancer, inflammatory bowel disease, irritable bowel syndrome, allergy such as Celiac Sprue, dietary/dairy problems, colitis, ulcers nor gastritis. No recent sick contacts/gastroenteritis. No travel outside the country. No changes in diet.  Renal: No UTIs, No hematuria Genital: No drainage, bleeding, masses Musculoskeletal: No severe joint pain. Good ROM major joints Skin: No sores or lesions Heme/Lymph: No easy bleeding. No swollen lymph  nodes Neuro: No active seizures. No facial droop Psych: No hallucinations. No agitation  OBJECTIVE   Vitals:  05/05/23 1118  BP: 121/81  Pulse: 80  Temp: 36.9 C (98.4 F)  SpO2: 98%  Weight: (!) 108 kg (238 lb)  Height: 180.3 cm (5' 11)  PainSc: 0-No pain   Body mass index is 33.19 kg/m.  PHYSICAL EXAM:  Constitutional: Not cachectic. Hygeine adequate. Vitals signs as above.  Eyes: Wears glasses - vision corrected,Pupils reactive, normal extraocular movements. Sclera nonicteric Neuro: CN II-XII intact. No major focal sensory defects. No major motor deficits. Lymph: No head/neck/groin lymphadenopathy Psych: No severe agitation. No severe anxiety. Judgment & insight Adequate, Oriented x4, HENT: Normocephalic, Mucus membranes moist. No thrush. Hearing: adequate Neck: Supple, No tracheal deviation. No obvious thyromegaly Chest: No pain to chest wall compression. Good respiratory excursion. No audible wheezing CV: Pulses intact. regular. No major extremity edema Ext: No obvious deformity or contracture. Edema: Not present. No cyanosis Skin: No major subcutaneous nodules. Warm and dry Musculoskeletal: Severe joint rigidity not present. No obvious clubbing. No digital petechiae. Mobility: no assist device moving easily without restrictions  Abdomen: Obese Soft. Nondistended. Nontender. Hernia: Present at: Supra pulm midline, size 3x3cm. Diastasis recti: Large supraumbilical midline. No hepatomegaly. No splenomegaly.  Genital/Pelvic: Inguinal hernia: Obvious left groin bulging going down to upper scrotum sensitive consistent with recurrent recurrent left inguinal hernia. Small impulse in right inner groin suspicious for small inguinal hernia.. Inguinal lymph nodes: without lymphadenopathy nor hidradenitis.   Rectal: (Deferred)    ###################################################################  Labs, Imaging and Diagnostic Testing:  Located in 'Care Everywhere' section of  Epic EMR chart  PRIOR [facility] CLINIC NOTES:  Located in 'Care Everywhere' section of Epic EMR chart  SURGERY NOTES:  Located in 'Care Everywhere' section of Epic EMR chart  PATHOLOGY:  Not applicable  Assessment and Plan:  DIAGNOSES:  Diagnoses and all orders for this visit:  Recurrent left inguinal hernia  Recurrent incisional hernia with incarceration  Right inguinal hernia  Obesity, Class I, BMI 30.0-34.9 (see actual BMI)  Diastasis recti  Constipation, chronic    ASSESSMENT/PLAN  Pleasant obese active male with a recurrent-recurrent left inguinal hernia containing fat and omentum, possible subtle right inguinal hernia on CAT scan and exam, small but definite supraumbilical recurrent incisional hernia incarcerated with fat. Prior severe heavy lifting and coughing in the setting of obesity.  I think he would benefit from surgical repair. I would plan a robotic Tapp approach with lysis of adhesions. Will need extra time to get his old polypropylene preperitoneal mesh down. I suspect I am going to need to do bilateral repairs with overlapping mesh sheets. Possible in the larger sheets if it is a large defect. See if I can do primary closure of the recurrent hernia if it is direct space as I suspect. May have to do extra suturing and tacking. We will see.   I am guardedly hopeful that the supraumbilical hernia contains fat and may just require suture repair versus a more simple underlay mesh.   I did caution his risk of numbness or groin pain increases with a redo redo hernia repair. However it is incarcerated and sensitive and bothering him. He otherwise has great exercise tolerance. Thankfully he is no longer doing the superheavy lifting anymore.   The anatomy & physiology of the abdominal wall and pelvic floor was discussed. The pathophysiology of hernias in the inguinal and pelvic region was discussed. Natural history risks such as progressive enlargement, pain,  incarceration, and strangulation was discussed. Contributors to complications such as smoking, obesity, diabetes, prior surgery, etc were discussed.   I feel the risks of no intervention will lead to serious problems that outweigh the operative risks; therefore, I recommended surgery to reduce and repair the hernia. I explained laparoscopic techniques with possible need for an open approach. I noted usual use of mesh to patch and/or buttress hernia repair  Risks such as bleeding, infection, abscess, need for further treatment, injury to other organs, need for repair of tissues / organs, stroke, heart attack, death, and other risks were discussed. I noted a good likelihood this will help address the problem. Goals of post-operative recovery were discussed as well. Possibility that this will not correct all symptoms was explained. I stressed the importance of low-impact activity, aggressive pain control, avoiding constipation, & not pushing through pain to minimize risk of post-operative chronic pain or injury. Possibility of reherniation was discussed. We will work to minimize complications.   An educational handout further explaining the pathology & treatment options was given as well. Questions were answered. The patient and his wife both express & wish to proceed with surgery.  However his constipation definitely exacerbates things I strongly recommend he double his Metamucil or switch to MiraLAX  so he is moving his bowels every day instead of straining for have 1 once a week.  Sounds like since he started his steroid nasal application he having less coughing and sneezing.  Cough suppressants to minimize recurrent coughing which will exacerbate things as well.

## 2023-08-07 NOTE — Discharge Instructions (Addendum)
 HERNIA REPAIR: POST OP INSTRUCTIONS  ######################################################################  EAT Gradually transition to a high fiber diet with a fiber supplement over the next few weeks after discharge.  Start with a pureed / full liquid diet (see below)  WALK Walk an hour a day.  Control your pain to do that.    CONTROL PAIN Control pain so that you can walk, sleep, tolerate sneezing/coughing, and go up/down stairs.  HAVE A BOWEL MOVEMENT DAILY Keep your bowels regular to avoid problems.  OK to try a laxative to override constipation.  OK to use an antidairrheal to slow down diarrhea.  Call if not better after 2 tries  CALL IF YOU HAVE PROBLEMS/CONCERNS Call if you are still struggling despite following these instructions. Call if you have concerns not answered by these instructions  ######################################################################    DIET: Follow a light bland diet & liquids the first 24 hours after arrival home, such as soup, liquids, starches, etc.  Be sure to drink plenty of fluids.  Quickly advance to a usual solid diet within a few days.  Avoid fast food or heavy meals as your are more likely to get nauseated or have irregular bowels.  A low-fat, high-fiber diet for the rest of your life is ideal.   Take your usually prescribed home medications unless otherwise directed.  PAIN CONTROL: Pain is best controlled by a usual combination of three different methods TOGETHER: Ice/Heat Over the counter pain medication Prescription pain medication Most patients will experience some swelling and bruising around the hernia(s) such as the bellybutton, groins, or old incisions.  Ice packs or heating pads (30-60 minutes up to 6 times a day) will help. Use ice for the first few days to help decrease swelling and bruising, then switch to heat to help relax tight/sore spots and speed recovery.  Some people prefer to use ice alone, heat alone, alternating  between ice & heat.  Experiment to what works for you.  Swelling and bruising can take several weeks to resolve.   It is helpful to take an over-the-counter pain medication regularly for the first few weeks.  Choose one of the following that works best for you: Naproxen (Aleve, etc)  Two 220mg  tabs twice a day Ibuprofen  (Advil , etc) Three 200mg  tabs four times a day (every meal & bedtime) Acetaminophen  (Tylenol , etc) 325-650mg  four times a day (every meal & bedtime) A  prescription for pain medication should be given to you upon discharge.  Take your pain medication as prescribed.  If you are having problems/concerns with the prescription medicine (does not control pain, nausea, vomiting, rash, itching, etc), please call us  [phone] to see if we need to switch you to a different pain medicine that will work better for you and/or control your side effect better. If you need a refill on your pain medication, please contact your pharmacy.  They will contact our office to request authorization. Prescriptions will not be filled after 5 pm or on week-ends.  AVOID GETTING CONSTIPATED.   Between the surgery and the pain medications, it is common to experience some constipation.  Drink plenty of liquids Take a fiber supplement 2 times day (such as Metamucil, Citrucel, FiberCon, MiraLax , etc) to have a bowel movement every day. If you have not had a BM by 2 days after surgery: -drink liquids only until you have a bowel movement -take MiraLAX  2 doses every 2 hours until you have a bowel movement   Wash / shower every day.  You may  shower over the dressings as they are waterproof.    It is good for closed incisions and even open wounds to be washed every day.  Shower every day.  Short baths are fine.  Wash the incisions and wounds clean with soap & water .    You may leave closed incisions open to air if it is dry.   You may cover the incision with clean gauze & replace it after your daily shower for  comfort.  TEGADERM:  You have clear gauze band-aid dressings over your closed incision(s).  Remove the dressings 2 days after surgery = 6/22 Sunday.    ACTIVITIES as tolerated:   You may resume regular (light) daily activities beginning the next day--such as daily self-care, walking, climbing stairs--gradually increasing activities as tolerated.  Control your pain so that you can walk an hour a day.  If you can walk 30 minutes without difficulty, it is safe to try more intense activity such as jogging, treadmill, bicycling, low-impact aerobics, swimming, etc. Save the most intensive and strenuous activity for last such as sit-ups, heavy lifting, contact sports, etc  Refrain from any heavy lifting or straining until you are off narcotics for pain control.   DO NOT PUSH THROUGH PAIN.  Let pain be your guide: If it hurts to do something, don't do it.  Pain is your body warning you to avoid that activity for another week until the pain goes down. You may drive when you are no longer taking prescription pain medication, you can comfortably wear a seatbelt, and you can safely maneuver your car and apply brakes. You may have sexual intercourse when it is comfortable.   FOLLOW UP in our office Please call [facility] at [phone] to set up an appointment to see your surgeon in the office for a follow-up appointment approximately 2-3 weeks after your surgery. Make sure that you call for this appointment the day you arrive home to insure a convenient appointment time.  9.  If you have disability of FMLA / Family leave forms, please bring the forms to the office for processing.  (do not give to your surgeon).  WHEN TO CALL US  [phone]: Poor pain control Reactions / problems with new medications (rash/itching, nausea, etc)  Fever over 101.5 F (38.5 C) Inability to urinate Nausea and/or vomiting Worsening swelling or bruising Continued bleeding from incision. Increased pain, redness, or drainage  from the incision   The clinic staff is available to answer your questions during regular business hours (8:30am-5pm).  Please don't hesitate to call and ask to speak to one of our nurses for clinical concerns.   If you have a medical emergency, go to the nearest emergency room or call 911.  A surgeon from [facility] is always on call at the hospitals in [facility] [place] [address], [place], [place]  [address] ?  [address] [address], [place], [place]   [address] MAIN: [phone] ? TOLL FREE: [phone] ? FAX: [phone] [email or web address]

## 2023-08-07 NOTE — Op Note (Signed)
 08/07/2023  10:43 AM  PATIENT:  Marvin [name]  73 y.o. 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, INCISIONAL HERNIA  POST-OPERATIVE DIAGNOSIS:   RECURRENT-RECURRENT LEFT INGUINAL HERNIA RIGHT INGUINAL HERNIA INCISIONAL PORT-SITE HERNIA DIASTASIS RECTI   PROCEDURE:   REPAIR, HERNIA, INGUINAL, BILATERAL, ROBOT-ASSISTED REPAIR, HERNIA, INCISIONAL - PRIMARY CLOSURE ROBOTIC LYSIS OF ADHESIONS X 90 min (65% case) TRANSVERSUS ABDOMINIS PLANE (TAP) BLOCK - BILATERAL  SURGEON:  Eddye Goodie, MD  ASSISTANT:  (n/a)   ANESTHESIA:  General endotracheal intubation anesthesia (GETA) and Regional TRANSVERSUS ABDOMINIS PLANE (TAP) nerve block -BILATERAL for perioperative & postoperative pain control at the level of the transverse abdominis & preperitoneal spaces along the flank at the anterior axillary line, from subcostal ridge to iliac crest under laparoscopic guidance provided with liposomal bupivacaine  (Experel) 20mL mixed with 60 mL of bupivicaine 0.25% with epinephrine   Estimated Blood Loss (EBL):   Total I/O In: 350 [IV Piggyback:350] Out: 450 [Urine:400; Blood:50].   (See anesthesia record)  Delay start of Pharmacological VTE agent (>24hrs) due to concerns of significant anemia, surgical blood loss, or risk of bleeding?:  no  DRAINS: (None)  SPECIMEN:  (no specimen)  DISPOSITION OF SPECIMEN:  (not applicable)  COUNTS:  Sponge, needle, & instrument counts CORRECT at the conclusion of the case.      PLAN OF CARE: Discharge to home after PACU  PATIENT DISPOSITION:  PACU - hemodynamically stable.  INDICATION: Pleasant active obese male with history of umbilical and incisional hernias with  prior repair.  Then developed inguinal hernia.  Robotic repair by Dr. Gaylyn Keas 2023.  Recurrence rate.  An open fashion 2024.  Another recurrence of the left side and possible new 1 on the right.  Concern for a hernia perhaps of one of the port sites.  Recommended invasive exploration and repair of hernias found.  Most likely a robotic approach given numerous layers of mesh and complexity.  The anatomy & physiology of the abdominal wall and pelvic floor was discussed.  The pathophysiology of hernias in the inguinal and pelvic region was discussed.  Natural history risks such as progressive enlargement, pain, incarceration & strangulation was discussed.   Contributors to complications such as smoking, obesity, diabetes, prior surgery, etc were discussed.    I feel the risks of no intervention will lead to serious problems that outweigh the operative risks; therefore, I recommended surgery to reduce and repair the hernia.  I explained robotic and laparoscopic techniques with possible need for an open approach.  I noted usual use of mesh to patch and/or buttress hernia repair  Risks such as bleeding, infection, abscess, need for further treatment, heart attack, death, and other risks were discussed.  I noted a good likelihood this will help address the problem.   Goals of post-operative recovery were discussed as well.  Possibility that this will not correct all symptoms was explained.  I stressed the importance of low-impact activity, aggressive pain control, avoiding constipation, & not pushing through pain to minimize risk of post-operative chronic pain or injury. Possibility of reherniation was discussed.  We will work to minimize complications.     An educational handout further explaining the pathology & treatment options was given as well.  Questions were answered.  The patient expresses  understanding & wishes to proceed with surgery.  OR FINDINGS: Recurrent indirect inguinal hernia on the left side  with mesh herniation.  No definite direct space femoral nor operator hernia.  On the right side smaller but definite indirect inguinal hernia.  Very small direct space hernia with some fat incarceration.  Laxity in the femoral canal but no true hernia.  No operator hernia.  Thin abdominal wall with diastases recti with intact mesh.  Left supraumbilical port site hernia 1 cm primarily repaired.  DESCRIPTION:  Informed consent was confirmed.  The patient underwent general anaesthesia without difficulty.  The patient was positioned appropriately.  VTE prevention in place.  The patient's abdomen was clipped, prepped, & draped in a sterile fashion.  Surgical timeout confirmed our plan.  Peritoneal entry with a laparoscopic port was obtained using Varess spring needle entry technique in the left upper abdomen as the patient was positioned in reverse Trendelenburg.  I induced carbon dioxide insufflation.  No change in end tidal CO2 measurements.  Full symmetrical abdominal distention.  Initial port was carefully placed.  Camera inspection revealed no injury.  I saw no hernias in the upper and periumbilical abdomen except for a subtle 1 in the left supraumbilical region which correlated from her former port site.  Could see herniation of old mesh on the left side and defending on the right consistent with recurrent recurrent left inguinal as well as right inguinal hernias.  Extra ports were carefully placed under direct laparoscopic visualization.  I placed the 12 mm port through the periumbilical mesh.  I placed an 8 mm port through the left supraumbilical port site hernia.  Robot was carefully docked.  Proceeded with a Tapp like approach by scoring the peritoneum just infraumbilically and just inferior to his periumbilical mesh in a transverse fashion from left anterior axillary line to right anterior axillary line.  Focus of the midline to go to the retrorectus space and then transition over to the peritoneum  more laterally.  I used blunt & focused sharp dissection to free the peritoneum off the flank and down to the pubic rim.  I focused on the right side since that was a more clean plane.  Came around laterally and freed the peritoneum to help identify the cord structures  I freed the anteriolateral bladder wall off the anteriolateral pelvic wall, sparing midline attachments.   I located a swath of peritoneum going into a hernia fascial defect at the  internal ring consistent with  an indirect inguinal hernia..  I gradually freed the peritoneal hernia sac off safely and reduced it into the preperitoneal space.  Patient had a low riding direct space hernia that was rather small as well.  Some laxity in the femoral canal but no hernia there nor in the obturator canal.  I freed the peritoneum off the spermatic vessels & vas deferens.  I freed peritoneum off the retroperitoneum along the psoas muscle.   Inguinal canal cord lipoma was dissected away & removed.  I checked & assured hemostasis.     I turned attention on the opposite  RIGHT pelvis.  I did dissection in a similar, mirror-image fashion.  I started laterally on clean planes and came to the preperitoneal mesh.  I alternated from coming laterally and medially carefully freed the dome of the bladder off the left pubis.  Eventually peeled the peritoneum and the herniated prior polypropylene mesh off the spermatic cord structures, vas, iliacs.  Connected with the prior dissection in the midline  pelvis until I knew that the bladder and important structures were down.  I then meticulously freed off the mesh and hernia sac off the remaining pubic bone and reduced a large fatty hernia sac out of the inguinal canal.  The patient had an indirect inguinal hernia..   Inguinal canal cord lipoma was dissected away & removed.    I checked & assured hemostasis.  No evidence of any direct space femoral or obturator hernias noted.  I chose sheets of medium-weight  polypropylene Bard Marlex 15x15cm, one for each side.  I cut a single sigmoid-shaped slit ~6cm from a corner of each mesh.  I placed the meshes into the preperitoneal space & laid them as overlapping diamonds such that at the inferior points, a 6x6 cm corner flap rested in the true anterolateral pelvis, covering the obturator & femoral foramina.   I allowed the bladder to return to the pubis, this helping tuck the corners of the mesh in the anteriolateral pelvis.  The medial corners overlapped each other across midline cephalad to the pubic rim.   Given the numerous hernias of moderate size, I placed a third 15x15cm mesh in the center as a vertical diamond.  The lateral wings of the mesh overlap across the direct spaces and internal rings where the dominant hernias were.  This provided good coverage and reinforcement of the hernia repairs.  Because of the central mesh placement with good overlap, I did not place any tacks.   I sutured the peritoneum back to the anterior abdominal wall transversely using running absorbable serrated STRATAFIX suture.  I started from lateral corners and came to the midline.  I did do suturing to help tack the most superolateral corners of the right left mesh as well as the apex of the midline mesh.  That helped tack it.  Did inspection to prove no peritoneal breaches.  Hemostasis good.  There was no omentum to bring down.  The sigmoid colon was inspected.  The had been the most involved with the left side looked intact.  No injury to bladder or other structures.  We saw no hernias elsewhere and noted good hemostasis. Reassuring.   I evacuated carbon dioxide.  I closed the 12 mm port site as well as the 1 cm left supraumbilical incisional hernia primarily with #1 PDS in interrupted fashion to good result.    I closed the skin using 4-0 monocryl stitch.  Sterile dressings were applied.   The patient was extubated & arrived in the PACU in stable condition..  I had discussed  postoperative care with the patient in the holding area.  Instructions are written in the chart.  I discussed operative findings, updated the patient's status, discussed probable steps to recovery, and gave postoperative recommendations to the patient's spouse, Assunta Lax.  Recommendations were made.  Questions were answered.  She expressed understanding & appreciation.   Eddye Goodie, M.D., F.A.C.S. Gastrointestinal and Minimally Invasive Surgery [facility] [address], [address] [place], [place] [address] [phone] Main / Paging  08/07/2023 10:43 AM

## 2023-08-07 NOTE — Interval H&P Note (Signed)
 History and Physical Interval Note:  08/07/2023 7:20 AM  Marvin [name]  has presented today for surgery, with the diagnosis of LEFT AND POSSIBLE RIGHT INGUINAL RECURRENT HERNIAS, INCISIONAL HERNIA.  The various methods of treatment have been discussed with the patient and family. After consideration of risks, benefits and other options for treatment, the patient has consented to  Procedure(s) with comments: REPAIR, HERNIA, INGUINAL, BILATERAL, ROBOT-ASSISTED (Left) - ROBOTIC LEFT AND POSSIBLE RIGHT INGUINAL HERNIA REPAIRS REPAIR, HERNIA, INCISIONAL (N/A) - INCISIONAL HERNIA REPAIR as a surgical intervention.  The patient's history has been reviewed, patient examined, no change in status, stable for surgery.  I have reviewed the patient's chart and labs.  Questions were answered to the patient's satisfaction.    I have re-reviewed the the patient's records, history, medications, and allergies.  I have re-examined the patient.  I again discussed intraoperative plans and goals of post-operative recovery.  The patient agrees to proceed.  Marvin [name]  1950-12-06 [id number]  Patient Care Team: Nche, Connye Delaine, NP as PCP - [facility] ([facility], [facility]  Jacolyn Matar, MD as Attending Physician (General Surgery) Candyce Champagne, MD as Consulting Physician (General Surgery)  Patient Active Problem List   Diagnosis Date Noted   BMI 32.0-32.9,adult 03/26/2022   Myalgia due to statin 05/13/2021   Vitamin D  deficiency 07/05/2020   Constipation 03/15/2020   Diverticular disease of colon 03/15/2020   S/P hernia repair 03/15/2020   Primary osteoarthritis of right hip 10/03/2019   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   Chronic bilateral low back pain without sciatica 02/21/2019   DM (diabetes mellitus) (HCC) 08/25/2017   Hyperuricemia 08/25/2017   Dyspepsia 02/23/2017   HTN (hypertension) 09/11/2015   Hyperlipidemia associated  with type 2 diabetes mellitus (HCC) 09/11/2015    Past Medical History:  Diagnosis Date   Alcohol  abuse    Allergy    Arthritis    Back pain    Constipation 03/15/2020   Drug abuse (HCC)    Gout    Gout 05/20/2016   Hernia    Hypercholesteremia    Hypertension    Joint pain    Obesity    Osteoarthritis    Pre-diabetes    Prediabetes    Stomach ulcer    Swallowing difficulty    Wears dentures    bottom front    Past Surgical History:  Procedure Laterality Date   HERNIA REPAIR     twice   INGUINAL HERNIA REPAIR Left 07/15/2022   Procedure: OPEN LEFT INGUINAL HERNIA REPAIR;  Surgeon: Jacolyn Matar, MD;  Location: [facility];  Service: General;  Laterality: Left;   left inguinal hernia repair      left shoulder rotator cuff repair      REPLACEMENT TOTAL KNEE     partial   right knee replacement      SHOULDER SURGERY     right   TOTAL HIP ARTHROPLASTY Right 10/03/2019   Procedure: RIGHT TOTAL HIP ARTHROPLASTY ANTERIOR APPROACH;  Surgeon: Wes Hamman, MD;  Location: [facility];  Service: Orthopedics;  Laterality: Right;   UMBILICAL HERNIA REPAIR     x 2    Social History   Socioeconomic History   Marital status: Married    Spouse name: Not on file   Number of children: Not on file   Years of education: Not on file   Highest education level: Professional school degree (e.g., MD, DDS, DVM, JD)  Occupational History  Not on file  Tobacco Use   Smoking status: Former    Current packs/day: 0.00    Types: Cigarettes    Quit date: 03/20/1998    Years since quitting: 25.4   Smokeless tobacco: Former    Types: Associate Professor status: Never Used  Substance and Sexual Activity   Alcohol  use: Not Currently    Comment: hx of 23 years ago   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  Other Topics Concern   Not on file  Social History Narrative   Not on file   Social Drivers of  Health   Financial Resource Strain: Low Risk  (05/15/2023)   Overall Financial Resource Strain (CARDIA)    Difficulty of Paying Living Expenses: Not very hard  Food Insecurity: No Food Insecurity (05/15/2023)   Hunger Vital Sign    Worried About Running Out of Food in the Last Year: Never true    Ran Out of Food in the Last Year: Never true  Transportation Needs: No Transportation Needs (05/15/2023)   PRAPARE - Administrator, Civil Service (Medical): No    Lack of Transportation (Non-Medical): No  Physical Activity: Sufficiently Active (05/15/2023)   Exercise Vital Sign    Days of Exercise per Week: 5 days    Minutes of Exercise per Session: 90 min  Stress: Stress Concern Present (05/15/2023)   [employer] of Occupational Health - Occupational Stress Questionnaire    Feeling of Stress : To some extent  Social Connections: Socially Integrated (05/15/2023)   Social Connection and Isolation Panel    Frequency of Communication with Friends and Family: Three times a week    Frequency of Social Gatherings with Friends and Family: Twice a week    Attends Religious Services: More than 4 times per year    Active Member of [employer] or Organizations: Yes    Attends Banker Meetings: 1 to 4 times per year    Marital Status: Married  Catering manager Violence: Not At Risk (12/05/2022)   Humiliation, Afraid, Rape, and Kick questionnaire    Fear of Current or Ex-Partner: No    Emotionally Abused: No    Physically Abused: No    Sexually Abused: No    Family History  Problem Relation Age of Onset   Healthy Mother    Healthy Father    Cancer Brother        bone cancer   Cancer Maternal Aunt        breast cancer   Cancer Maternal Uncle        lukemia   Cancer Cousin        unknown   Cancer Maternal Uncle        lukemia   Cancer Paternal Uncle        prostate   Hearing loss Maternal Grandfather 15    Medications Prior to Admission  Medication Sig Dispense  Refill Last Dose/Taking   acetaminophen  (TYLENOL ) 650 MG CR tablet Take 1,300 mg by mouth every other day.   08/07/2023 at  3:00 AM   allopurinol  (ZYLOPRIM ) 100 MG tablet TAKE 1 TABLET BY MOUTH DAILY 30 tablet 3 08/07/2023 at  3:00 AM   amLODipine  (NORVASC ) 10 MG tablet TAKE 1 TABLET BY MOUTH DAILY 90 tablet 1 08/07/2023 at  3:00 AM   aspirin  EC 81 MG tablet Take 1 tablet (  81 mg total) by mouth daily. Swallow whole. 30 tablet 12 08/07/2023 at  3:00 AM   fluticasone  (FLONASE ) 50 MCG/ACT nasal spray Place 2 sprays into both nostrils daily as needed for allergies or rhinitis. (Patient taking differently: Place 2 sprays into both nostrils at bedtime.) 16 g 11 08/06/2023   pantoprazole  (PROTONIX ) 20 MG tablet Take 1 tablet (20 mg total) by mouth daily. 90 tablet 0 08/07/2023 at  3:00 AM   simvastatin  (ZOCOR ) 20 MG tablet Take 1 tablet (20 mg total) by mouth 2 (two) times a week. 12 tablet 3 08/07/2023 at  3:00 AM   Vitamin D , Ergocalciferol , (DRISDOL ) 1.25 MG (50000 UNIT) CAPS capsule Take 1 capsule (50,000 Units total) by mouth every 7 (seven) days. (Patient taking differently: Take 50,000 Units by mouth every Thursday.) 12 capsule 0 Past Week   Semaglutide , 2 MG/DOSE, 8 MG/3ML SOPN Inject 2 mg as directed once a week. 3 mL 0 07/27/2023    Current Facility-Administered Medications  Medication Dose Route Frequency Provider Last Rate Last Admin   acetaminophen  (TYLENOL ) tablet 1,000 mg  1,000 mg Oral On Call to OR Candyce Champagne, MD       bupivacaine  liposome (EXPAREL ) 1.3 % injection 266 mg  20 mL Infiltration Once Candyce Champagne, MD       ceFAZolin  (ANCEF ) IVPB 2g/100 mL premix  2 g Intravenous On Call to OR Candyce Champagne, MD       Chlorhexidine  Gluconate Cloth 2 % PADS 6 each  6 each Topical Once Candyce Champagne, MD       dexamethasone  (DECADRON ) injection 4 mg  4 mg Intravenous On Call to OR Candyce Champagne, MD       lactated ringers  infusion   Intravenous Continuous Peggy Bowens, MD 10 mL/hr at 08/07/23  [phone] Continued from Pre-op at 08/07/23 [id number]     Allergies  Allergen Reactions   Buprenorphine Hcl Itching   Morphine And Codeine Itching    Injection only, IV ok     BP (!) 139/92   Pulse 63   Temp 97.7 F (36.5 C) (Oral)   Resp 16   Ht 5' 11 (1.803 m)   Wt 108.9 kg   SpO2 95%   BMI 33.47 kg/m   Labs: Results for orders placed or performed during the hospital encounter of 08/07/23 (from the past 48 hours)  Glucose, capillary     Status: None   Collection Time: 08/07/23  5:37 AM  Result Value Ref Range   Glucose-Capillary 98 70 - 99 mg/dL    Comment: Glucose reference range applies only to samples taken after fasting for at least 8 hours.   Comment 1 Notify RN    Comment 2 Document in Chart   Glucose, capillary     Status: Abnormal   Collection Time: 08/07/23  7:17 AM  Result Value Ref Range   Glucose-Capillary 105 (H) 70 - 99 mg/dL    Comment: Glucose reference range applies only to samples taken after fasting for at least 8 hours.   Comment 1 Notify RN     Imaging / Studies: No results found.   Eddye Goodie, M.D., F.A.C.S. Gastrointestinal and Minimally Invasive Surgery [facility] [address] [place], [place] [address] [phone] Main / Paging  08/07/2023 7:20 AM    Eddye Goodie

## 2023-08-07 NOTE — Anesthesia Procedure Notes (Signed)
 Procedure Name: Intubation Date/Time: 08/07/2023 7:42 AM  Performed by: Jenene Miser, CRNAPre-anesthesia Checklist: Patient identified, Emergency Drugs available, Suction available and Patient being monitored Patient Re-evaluated:Patient Re-evaluated prior to induction Oxygen Delivery Method: Circle system utilized Preoxygenation: Pre-oxygenation with 100% oxygen Induction Type: IV induction Ventilation: Mask ventilation without difficulty Laryngoscope Size: Mac and 4 Grade View: Grade II Tube type: Oral Tube size: 7.5 mm Number of attempts: 1 Airway Equipment and Method: Stylet Placement Confirmation: ETT inserted through vocal cords under direct vision, positive ETCO2 and breath sounds checked- equal and bilateral Secured at: 21 cm Tube secured with: Tape Dental Injury: Teeth and Oropharynx as per pre-operative assessment

## 2023-08-10 ENCOUNTER — Encounter (HOSPITAL_COMMUNITY): Payer: Self-pay | Admitting: Surgery

## 2023-08-14 ENCOUNTER — Other Ambulatory Visit: Payer: Self-pay | Admitting: Nurse Practitioner

## 2023-08-14 DIAGNOSIS — K219 Gastro-esophageal reflux disease without esophagitis: Secondary | ICD-10-CM

## 2023-08-23 ENCOUNTER — Ambulatory Visit
Admission: RE | Admit: 2023-08-23 | Discharge: 2023-08-23 | Disposition: A | Discharge status: Home / Self Care | Source: Ambulatory Visit | Attending: Nurse Practitioner | Admitting: Nurse Practitioner

## 2023-08-23 VITALS — BP 141/90 | HR 96 | Temp 98.2°F | Resp 18 | Ht 70.0 in | Wt 234.0 lb

## 2023-08-23 DIAGNOSIS — R21 Rash and other nonspecific skin eruption: Secondary | ICD-10-CM

## 2023-08-23 DIAGNOSIS — T63441A Toxic effect of venom of bees, accidental (unintentional), initial encounter: Secondary | ICD-10-CM

## 2023-08-23 DIAGNOSIS — K439 Ventral hernia without obstruction or gangrene: Secondary | ICD-10-CM | POA: Insufficient documentation

## 2023-08-23 HISTORY — DX: Unspecified cataract: H26.9

## 2023-08-23 HISTORY — DX: Other psychoactive substance abuse, uncomplicated: F19.10

## 2023-08-23 NOTE — ED Triage Notes (Signed)
 I got a bee sting (bumble) on my left knee, when in the car driving, now redness/sore. No stings anywhere else. I did some Calamine lotion, worked well. I didn't find the stinger, ? Inside area. No respiratory distress/symptoms.

## 2023-08-23 NOTE — ED Provider Notes (Signed)
 [facility]    CSN: [id number] Arrival date & time: 08/23/23  0841      History   Chief Complaint Chief Complaint  Patient presents with   Bee Sting (Reaction)    HPI Marvin [name] is a 73 y.o. male.   Patient presents to clinic over concern of retained bee sting or in the left knee area.  He was driving yesterday when a bee got into the car and he smacked it on his stung him in the left knee.  He had pain, redness and swelling around the initial sting site but cannot find the stinger.  He did take antihistamine yesterday, thinks it was a Claritin, use topical calamine lotion and is worked well.  He was unable to find the stinger and his wife is out of town, she suggested that he come in to be seen.  The history is provided by the patient and medical records.    Past Medical History:  Diagnosis Date   Alcohol  abuse    Allergy    Arthritis    Back pain    Cataract    Vision cloudy in lelf eye. Had eye exam on July 25, 2020   Constipation 03/15/2020   Drug abuse (HCC)    GERD (gastroesophageal reflux disease)    Gout    Gout 05/20/2016   Hernia    Hypercholesteremia    Hypertension    Joint pain    Obesity    Osteoarthritis    Pre-diabetes    Prediabetes    Stomach ulcer    Substance abuse (HCC) 06/14/1998   In  recovery 52yrs.   Swallowing difficulty    Wears dentures    bottom front    Patient Active Problem List   Diagnosis Date Noted   Ventral hernia without obstruction or gangrene 08/23/2023   Hernia of anterior abdominal wall 08/23/2023   BMI 32.0-32.9,adult 03/26/2022   Myalgia due to statin 05/13/2021   Vitamin D  deficiency 07/05/2020   Constipation 03/15/2020   Diverticular disease of colon 03/15/2020   S/P hernia repair 03/15/2020   Primary osteoarthritis of right hip 10/03/2019   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   Chronic bilateral low back pain without sciatica 02/21/2019   DM (diabetes mellitus) (HCC)  08/25/2017   Hyperuricemia 08/25/2017   Dyspepsia 02/23/2017   Gout 05/20/2016   HTN (hypertension) 09/11/2015   Hyperlipidemia associated with type 2 diabetes mellitus (HCC) 09/11/2015   Hyperlipidemia 09/11/2015    Past Surgical History:  Procedure Laterality Date   HERNIA REPAIR     twice   INCISIONAL HERNIA REPAIR N/A 08/07/2023   Procedure: REPAIR, HERNIA, INCISIONAL;  Surgeon: Sheldon Standing, MD;  Location: [facility];  Service: General;  Laterality: N/A;  INCISIONAL HERNIA REPAIR   INGUINAL HERNIA REPAIR Left 07/15/2022   Procedure: OPEN LEFT INGUINAL HERNIA REPAIR;  Surgeon: Gladis Cough, MD;  Location: [facility];  Service: General;  Laterality: Left;   JOINT REPLACEMENT     left inguinal hernia repair      left shoulder rotator cuff repair      REPAIR, HERNIA, INGUINAL, BILATERAL, ROBOT-ASSISTED Left 08/07/2023   Procedure: REPAIR, HERNIA, INGUINAL, BILATERAL, ROBOT-ASSISTED;  Surgeon: Sheldon Standing, MD;  Location: [facility];  Service: General;  Laterality: Left;  ROBOTIC LEFT AND POSSIBLE RIGHT INGUINAL HERNIA REPAIRS   REPLACEMENT TOTAL KNEE     partial   right knee replacement      SHOULDER SURGERY  right   TOTAL HIP ARTHROPLASTY Right 10/03/2019   Procedure: RIGHT TOTAL HIP ARTHROPLASTY ANTERIOR APPROACH;  Surgeon: Jerri Kay HERO, MD;  Location: [facility];  Service: Orthopedics;  Laterality: Right;   UMBILICAL HERNIA REPAIR     x 2       Home Medications    Prior to Admission medications   Medication Sig Start Date End Date Taking? Authorizing Provider  allopurinol  (ZYLOPRIM ) 100 MG tablet TAKE 1 TABLET BY MOUTH DAILY 06/08/23  Yes Nche, Roselie Rockford, NP  amLODipine  (NORVASC ) 10 MG tablet TAKE 1 TABLET BY MOUTH DAILY 05/26/23  Yes Nche, Roselie Rockford, NP  aspirin  EC 81 MG tablet Take 1 tablet (81 mg total) by mouth daily. Swallow whole. 11/08/21  Yes Nche, Roselie Rockford, NP  pantoprazole  (PROTONIX ) 20 MG tablet TAKE 1 TABLET BY MOUTH DAILY 08/14/23  Yes Nche, Roselie Rockford,  NP  simvastatin  (ZOCOR ) 20 MG tablet Take 1 tablet (20 mg total) by mouth 2 (two) times a week. 11/20/22  Yes Nche, Roselie Rockford, NP  acetaminophen  (TYLENOL ) 650 MG CR tablet Take 1,300 mg by mouth every other day.    [provider]  cyclobenzaprine  (FLEXERIL ) 5 MG tablet Take 1 tablet (5 mg total) by mouth 3 (three) times daily as needed for muscle spasms. 08/07/23   Sheldon Standing, MD  fluticasone  (FLONASE ) 50 MCG/ACT nasal spray Place 2 sprays into both nostrils daily as needed for allergies or rhinitis. Patient taking differently: Place 2 sprays into both nostrils at bedtime. 11/18/22   Nche, Roselie Rockford, NP  Semaglutide , 2 MG/DOSE, 8 MG/3ML SOPN Inject 2 mg as directed once a week. 08/03/23   Delores Shields A, DO  traMADol  (ULTRAM ) 50 MG tablet Take 1-2 tablets (50-100 mg total) by mouth every 6 (six) hours as needed for moderate pain (pain score 4-6) or severe pain (pain score 7-10). 08/07/23   Sheldon Standing, MD  Vitamin D , Ergocalciferol , (DRISDOL ) 1.25 MG (50000 UNIT) CAPS capsule Take 1 capsule (50,000 Units total) by mouth every 7 (seven) days. Patient taking differently: Take 50,000 Units by mouth every Thursday. 06/29/23   Delores Shields LABOR, DO    Family History Family History  Problem Relation Age of Onset   Healthy Mother    Healthy Father    Cancer Brother        bone cancer   Cancer Maternal Aunt        breast cancer   Cancer Maternal Uncle        lukemia   Cancer Cousin        unknown   Cancer Maternal Uncle        lukemia   Cancer Paternal Uncle        prostate   Hearing loss Maternal Grandfather 57    Social History Social History   Tobacco Use   Smoking status: Former    Current packs/day: 0.00    Types: Cigarettes    Quit date: 03/20/1998    Years since quitting: 25.4   Smokeless tobacco: Former    Types: Associate Professor status: Never Used  Substance Use Topics   Alcohol  use: Not Currently    Comment: hx of 23 years ago   Drug use: Not  Currently    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     Allergies   Buprenorphine hcl and Morphine and codeine   Review of Systems Review of Systems  Per HPI  Physical Exam Triage Vital Signs ED Triage  Vitals  Encounter Vitals Group     BP 08/23/23 0849 (!) 141/90     Girls Systolic BP Percentile --      Girls Diastolic BP Percentile --      Boys Systolic BP Percentile --      Boys Diastolic BP Percentile --      Pulse Rate 08/23/23 0849 96     Resp 08/23/23 0849 18     Temp 08/23/23 0849 98.2 F (36.8 C)     Temp Source 08/23/23 0849 Oral     SpO2 08/23/23 0849 96 %     Weight 08/23/23 0846 234 lb (106.1 kg)     Height 08/23/23 0846 5' 10 (1.778 m)     Head Circumference --      Peak Flow --      Pain Score 08/23/23 0844 4     Pain Loc --      Pain Education --      Exclude from Growth Chart --    No data found.  Updated Vital Signs BP (!) 141/90 (BP Location: Right Arm)   Pulse 96   Temp 98.2 F (36.8 C) (Oral)   Resp 18   Ht 5' 10 (1.778 m)   Wt 234 lb (106.1 kg)   SpO2 96%   BMI 33.58 kg/m   Visual Acuity Right Eye Distance:   Left Eye Distance:   Bilateral Distance:    Right Eye Near:   Left Eye Near:    Bilateral Near:     Physical Exam Vitals and nursing note reviewed.  Constitutional:      Appearance: Normal appearance.  HENT:     Head: Normocephalic and atraumatic.     Right Ear: External ear normal.     Left Ear: External ear normal.     Nose: Nose normal.     Mouth/Throat:     Mouth: Mucous membranes are moist.  Eyes:     Conjunctiva/sclera: Conjunctivae normal.  Cardiovascular:     Rate and Rhythm: Normal rate.  Pulmonary:     Effort: Pulmonary effort is normal. No respiratory distress.  Skin:    General: Skin is warm and dry.      Neurological:     General: No focal deficit present.     Mental Status: He is alert.  Psychiatric:        Mood and Affect: Mood normal.        Behavior: Behavior is cooperative.      UC  Treatments / Results  Labs (all labs ordered are listed, but only abnormal results are displayed) Labs Reviewed - No data to display  EKG   Radiology No results found.  Procedures Procedures (including critical care time)  Medications Ordered in UC Medications - No data to display  Initial Impression / Assessment and Plan / UC Course  I have reviewed the triage vital signs and the nursing notes.  Pertinent labs & imaging results that were available during my care of the patient were reviewed by me and considered in my medical decision making (see chart for details).  Vitals in triage reviewed, patient is hemodynamically stable.  Without any respiratory symptoms, able to speak in full sentences.  Localized reaction on the left medial knee from bee sting, area is smooth with no evidence of retained stinger.  Encouraged continuation of antihistamine and topical methods.   Plan of care, follow-up care and complications such as signs of infection discussed, no questions at this time.  Final Clinical Impressions(s) / UC Diagnoses   Final diagnoses:  Bee sting, accidental or unintentional, initial encounter  Rash and nonspecific skin eruption     Discharge Instructions      I do not see evidence of retained stinger from the bee.  Continue a daily antihistamine such as 10 mg of Claritin or Zyrtec daily.  He can take 25 mg of Benadryl  for any breakthrough itching, this may cause sedation.  You can also use a topical Benadryl  cream to help with itching.  If you begin to develop knee pain, knee swelling, fever, or new concerning symptoms please return for reevaluation.     ED Prescriptions   None    I have reviewed the PDMP during this encounter.   Dreama, Sanjith Siwek  N, FNP 08/23/23 [phone]

## 2023-08-23 NOTE — Discharge Instructions (Addendum)
 I do not see evidence of retained stinger from the bee.  Continue a daily antihistamine such as 10 mg of Claritin or Zyrtec daily.  He can take 25 mg of Benadryl  for any breakthrough itching, this may cause sedation.  You can also use a topical Benadryl  cream to help with itching.  If you begin to develop knee pain, knee swelling, fever, or new concerning symptoms please return for reevaluation.

## 2023-08-30 ENCOUNTER — Other Ambulatory Visit: Payer: Self-pay | Admitting: Nurse Practitioner

## 2023-08-30 DIAGNOSIS — E782 Mixed hyperlipidemia: Secondary | ICD-10-CM

## 2023-09-14 ENCOUNTER — Ambulatory Visit (INDEPENDENT_AMBULATORY_CARE_PROVIDER_SITE_OTHER): Admitting: Bariatrics

## 2023-09-14 ENCOUNTER — Encounter: Payer: Self-pay | Admitting: Bariatrics

## 2023-09-14 VITALS — BP 120/77 | HR 77 | Temp 97.6°F | Ht 71.0 in | Wt 238.0 lb

## 2023-09-14 DIAGNOSIS — E559 Vitamin D deficiency, unspecified: Secondary | ICD-10-CM

## 2023-09-14 DIAGNOSIS — E669 Obesity, unspecified: Secondary | ICD-10-CM

## 2023-09-14 DIAGNOSIS — Z7985 Long-term (current) use of injectable non-insulin antidiabetic drugs: Secondary | ICD-10-CM

## 2023-09-14 DIAGNOSIS — Z6833 Body mass index (BMI) 33.0-33.9, adult: Secondary | ICD-10-CM

## 2023-09-14 DIAGNOSIS — E6609 Other obesity due to excess calories: Secondary | ICD-10-CM

## 2023-09-14 DIAGNOSIS — E119 Type 2 diabetes mellitus without complications: Secondary | ICD-10-CM

## 2023-09-14 MED ORDER — VITAMIN D (ERGOCALCIFEROL) 1.25 MG (50000 UNIT) PO CAPS: 50000.0000 [IU] | ORAL_CAPSULE | ORAL | 0 refills | Status: DC

## 2023-09-14 MED ORDER — SEMAGLUTIDE (2 MG/DOSE) 8 MG/3ML ~~LOC~~ SOPN: 2.0000 mg | PEN_INJECTOR | SUBCUTANEOUS | 0 refills | Status: DC

## 2023-09-14 NOTE — Progress Notes (Signed)
 WEIGHT SUMMARY AND BIOMETRICS  Weight Lost Since Last Visit: 2lb  Weight Gained Since Last Visit: 0   Vitals Temp: 97.6 F (36.4 C) BP: 120/77 Pulse Rate: 77 SpO2: 98 %   Anthropometric Measurements Height: 5' 11 (1.803 m) Weight: 238 lb (108 kg) BMI (Calculated): 33.21 Weight at Last Visit: 240lb Weight Lost Since Last Visit: 2lb Weight Gained Since Last Visit: 0 Starting Weight: 242lb Total Weight Loss (lbs): 4 lb (1.814 kg)   Body Composition  Body Fat %: 30.6 % Fat Mass (lbs): 72.8 lbs Muscle Mass (lbs): 157 lbs Total Body Water  (lbs): 112.8 lbs Visceral Fat Rating : 19   Other Clinical Data Fasting: no Labs: no Today's Visit #: 69 Starting Date: 07/04/20    OBESITY Marvin [name] [name] here to discuss his progress with his obesity treatment plan along with follow-up of his obesity related diagnoses.    Nutrition Plan: the Category 2 plan - 60% adherence.  Current exercise: walking  Interim History:  He [name] down 2 lbs since her last visit.  Eating all of the food on the plan., Protein intake [name] as prescribed, [name] not skipping meals, Water  intake [name] adequate., and Denies polyphagia   Pharmacotherapy: Marvin [name] on Ozempic  2 mg SQ weekly Adverse side effects: None Hunger [name] moderately controlled.  Cravings are well controlled.  Assessment/Plan:   1. Vitamin D  deficiency Vitamin D  [name] not at goal of 50.  Most recent vitamin D  level was 42.4. He [name] on  prescription ergocalciferol  50,000 IU weekly. Lab Results  Component Value Date   VD25OH 42.4 10/23/2020   VD25OH 7.3 (L) 07/04/2020    Plan: Refill prescription vitamin D  50,000 IU weekly.   Type II Diabetes HgbA1c [name] at goal. Last A1c was 6.0 CBGs: Not checking      Episodes of hypoglycemia: no Medication(s): Ozempic  2 mg SQ weekly  Lab Results  Component Value Date   HGBA1C 6.0  05/19/2023   HGBA1C 5.9 11/18/2022   HGBA1C 5.9 (H) 07/02/2022   Lab Results  Component Value Date   MICROALBUR 1.1 08/03/2018   LDLCALC 89 11/18/2022   CREATININE 1.06 07/27/2023   Lab Results  Component Value Date   GFR 68.12 05/19/2023   GFR 77.58 11/18/2022   GFR 64.38 05/12/2022    Plan: Continue and refill Ozempic  2 mg SQ weekly Continue all other medications.  Will keep all carbohydrates low both sweets and starches.  Will continue exercise regimen to 30 to 60 minutes on most days of the week.  Aim for 7 to 9 hours of sleep nightly.  Eat more low glycemic index foods.  He has added protein shakes.      Morbid Obesity: Current BMI BMI (Calculated): 33.21   Pharmacotherapy Plan Continue and refill  Ozempic  2 mg SQ weekly  Marvin [name] currently in the action stage of change. As such,  his goal [name] to continue with weight loss efforts.  He has agreed to the Category 4 plan.  Exercise goals: All adults should avoid inactivity. Some physical activity [name] better than none, and adults who participate in any amount of physical activity gain some health benefits.  Behavioral modification strategies: increasing lean protein intake, no meal skipping, meal planning , increase water  intake, better snacking choices, planning for success, increasing vegetables, increasing fiber rich foods, weigh protein portions, measure portion sizes, work on smaller portions, and mindful eating.  Marvin [name] has agreed to follow-up with our clinic in 4 weeks.     Objective:   VITALS: Per patient if applicable, see vitals. GENERAL: Alert and in no acute distress. CARDIOPULMONARY: No increased WOB. Speaking in clear sentences.  PSYCH: Pleasant and cooperative. Speech normal rate and rhythm. Affect [name] appropriate. Insight and judgement are appropriate. Attention [name] focused, linear, and appropriate.  NEURO: Oriented as arrived to appointment on time with no prompting.   Attestation Statements:    This was prepared with the assistance of Engineer, civil (consulting).  Occasional wrong-word or sound-a-like substitutions may have occurred due to the inherent limitations of voice recognition

## 2023-09-14 NOTE — Progress Notes (Deleted)
                                                                                                              WEIGHT SUMMARY AND BIOMETRICS  No data recorded No data recorded  No data recorded Anthropometric Measurements Height: 5' 11 (1.803 m) Weight at Last Visit: 240lb Starting Weight: 242lb   No data recorded Other Clinical Data Fasting: no Labs: no Today's Visit #: 70 Starting Date: 07/04/20    OBESITY Marvin [name] is here to discuss his progress with his obesity treatment plan along with follow-up of his obesity related diagnoses.    Nutrition Plan: the Category 4 plan - ***% adherence.  Current exercise: {exercise types:16438}  Interim History:  *** {aabnutritionassessment:29213}   Pharmacotherapy: Marvin [name] is on {dwwpharmacotherapy:29109} Adverse side effects: {dwwse:29122} Hunger is {EWCONTROLASSESSMENT:24261}.  Cravings are {EWCONTROLASSESSMENT:24261}.  Assessment/Plan:   There are no diagnoses linked to this encounter.    {dwwmorbid:29108::Morbid Obesity}: Current BMI No data recorded  Pharmacotherapy Plan {dwwmed:29123}  {dwwpharmacotherapy:29109}  Marvin [name] {CHL AMB IS/IS NOT:210130109} currently in the action stage of change. As such, his goal is to {MWMwtloss#1:210800005}.  He has agreed to {dwwsldiets:29085}.  Exercise goals: {MWM EXERCISE RECS:23473}  Behavioral modification strategies: {dwwslwtlossstrategies:29088}.  Marvin [name] has agreed to follow-up with our clinic in {NUMBER 1-10:22536} weeks.   No orders of the defined types were placed in this encounter.   There are no discontinued medications.   No orders of the defined types were placed in this encounter.     Objective:   VITALS: Per patient if applicable, see vitals. GENERAL: Alert and in no acute distress. CARDIOPULMONARY: No increased WOB. Speaking in clear sentences.  PSYCH: Pleasant and cooperative. Speech normal rate and rhythm. Affect is appropriate. Insight and  judgement are appropriate. Attention is focused, linear, and appropriate.  NEURO: Oriented as arrived to appointment on time with no prompting.   Attestation Statements:   This was prepared with the assistance of Engineer, civil (consulting).  Occasional wrong-word or sound-a-like substitutions may have occurred due to the inherent limitations of voice recognition

## 2023-09-16 ENCOUNTER — Ambulatory Visit (HOSPITAL_BASED_OUTPATIENT_CLINIC_OR_DEPARTMENT_OTHER): Admitting: Internal Medicine

## 2023-09-16 ENCOUNTER — Encounter (HOSPITAL_BASED_OUTPATIENT_CLINIC_OR_DEPARTMENT_OTHER): Payer: Self-pay

## 2023-09-16 ENCOUNTER — Encounter (HOSPITAL_BASED_OUTPATIENT_CLINIC_OR_DEPARTMENT_OTHER): Payer: Self-pay | Admitting: Internal Medicine

## 2023-09-16 VITALS — BP 138/78 | HR 76 | Ht 71.0 in | Wt 243.0 lb

## 2023-09-16 DIAGNOSIS — E785 Hyperlipidemia, unspecified: Secondary | ICD-10-CM | POA: Diagnosis not present

## 2023-09-16 DIAGNOSIS — E782 Mixed hyperlipidemia: Secondary | ICD-10-CM

## 2023-09-16 DIAGNOSIS — E119 Type 2 diabetes mellitus without complications: Secondary | ICD-10-CM | POA: Diagnosis not present

## 2023-09-16 DIAGNOSIS — I10 Essential (primary) hypertension: Secondary | ICD-10-CM | POA: Diagnosis not present

## 2023-09-16 MED ORDER — SIMVASTATIN 20 MG PO TABS: 20.0000 mg | ORAL_TABLET | Freq: Every day | ORAL | 3 refills | Status: AC

## 2023-09-16 MED ORDER — EZETIMIBE 10 MG PO TABS: 10.0000 mg | ORAL_TABLET | Freq: Every day | ORAL | 3 refills | Status: AC

## 2023-09-16 NOTE — Progress Notes (Unsigned)
 LIPID CLINIC CONSULT NOTE  Chief Complaint:  Manage dyslipidemia  Primary Care Physician: Katheen Roselie Rockford, NP  Primary Cardiologist:  None  HPI:  Marvin [name] is a 73 y.o. male who is being seen today for the evaluation of dyslipidemia at the request of Nche, Roselie Rockford, NP.  This is a pleasant 73 year old male kindly referred for evaluation management of dyslipidemia.  He has a history of type 2 diabetes with a target LDL less than 70.  He was referred for management options.  He has previously been on simvastatin  20 mg but only twice a week.  He reports taking the medication as prescribed but says that he did not previously had any issue with taking the medication more regularly.  He had a recent increase in his cholesterol.  Direct LDL in April was 128, up from 109 a year before that.  PMHx:  Past Medical History:  Diagnosis Date   Alcohol  abuse    Allergy    Arthritis    Back pain    Cataract    Vision cloudy in lelf eye. 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   Swallowing difficulty    Wears dentures    bottom front    Past Surgical History:  Procedure Laterality Date   HERNIA REPAIR     twice   INCISIONAL HERNIA REPAIR N/A 08/07/2023   Procedure: REPAIR, HERNIA, INCISIONAL;  Surgeon: Sheldon Standing, MD;  Location: [facility];  Service: General;  Laterality: N/A;  INCISIONAL HERNIA REPAIR   INGUINAL HERNIA REPAIR Left 07/15/2022   Procedure: OPEN LEFT INGUINAL HERNIA REPAIR;  Surgeon: Gladis Cough, MD;  Location: [facility];  Service: General;  Laterality: Left;   JOINT REPLACEMENT     left inguinal hernia repair      left shoulder rotator cuff repair       REPAIR, HERNIA, INGUINAL, BILATERAL, ROBOT-ASSISTED Left 08/07/2023   Procedure: REPAIR, HERNIA, INGUINAL, BILATERAL, ROBOT-ASSISTED;  Surgeon: Sheldon Standing, MD;  Location: [facility];  Service: General;  Laterality: Left;  ROBOTIC LEFT AND POSSIBLE RIGHT INGUINAL HERNIA REPAIRS   REPLACEMENT TOTAL KNEE     partial   right knee replacement      SHOULDER SURGERY     right   TOTAL HIP ARTHROPLASTY Right 10/03/2019   Procedure: RIGHT TOTAL HIP ARTHROPLASTY ANTERIOR APPROACH;  Surgeon: Jerri Kay HERO, MD;  Location: [facility];  Service: Orthopedics;  Laterality: Right;   UMBILICAL HERNIA REPAIR     x 2    FAMHx:  Family History  Problem Relation Age of Onset   Healthy Mother    Healthy Father    Cancer Brother        bone cancer   Cancer Maternal Aunt        breast cancer   Cancer Maternal Uncle        lukemia   Cancer Cousin        unknown   Cancer Maternal Uncle        lukemia   Cancer Paternal Uncle  prostate   Hearing loss Maternal Grandfather 31    SOCHx:   reports that he quit smoking about 25 years ago. His smoking use included cigarettes. He has quit using smokeless tobacco.  His smokeless tobacco use included chew. He reports that he does not currently use alcohol . He reports that he does not currently use drugs.  ALLERGIES:  Allergies  Allergen Reactions   Buprenorphine Hcl Itching   Morphine And Codeine Itching    Injection only, IV ok     ROS: Pertinent items noted in HPI and remainder of comprehensive ROS otherwise negative.  HOME MEDS: Current Outpatient Medications on File Prior to Visit  Medication Sig Dispense Refill   acetaminophen  (TYLENOL ) 650 MG CR tablet Take 1,300 mg by mouth every other day.     allopurinol  (ZYLOPRIM ) 100 MG tablet TAKE 1 TABLET BY MOUTH DAILY 30 tablet 3   amLODipine  (NORVASC ) 10 MG tablet TAKE 1 TABLET BY MOUTH DAILY 90 tablet 1   aspirin  EC 81 MG tablet Take 1 tablet (81 mg total) by mouth daily. Swallow whole. 30 tablet  12   cyclobenzaprine  (FLEXERIL ) 5 MG tablet Take 1 tablet (5 mg total) by mouth 3 (three) times daily as needed for muscle spasms. 20 tablet 2   fluticasone  (FLONASE ) 50 MCG/ACT nasal spray Place 2 sprays into both nostrils daily as needed for allergies or rhinitis. (Patient taking differently: Place 2 sprays into both nostrils at bedtime.) 16 g 11   pantoprazole  (PROTONIX ) 20 MG tablet TAKE 1 TABLET BY MOUTH DAILY 30 tablet 5   Semaglutide , 2 MG/DOSE, 8 MG/3ML SOPN Inject 2 mg as directed once a week. 6 mL 0   traMADol  (ULTRAM ) 50 MG tablet Take 1-2 tablets (50-100 mg total) by mouth every 6 (six) hours as needed for moderate pain (pain score 4-6) or severe pain (pain score 7-10). 20 tablet 0   Vitamin D , Ergocalciferol , (DRISDOL ) 1.25 MG (50000 UNIT) CAPS capsule Take 1 capsule (50,000 Units total) by mouth every 7 (seven) days. 12 capsule 0   No current facility-administered medications on file prior to visit.    LABS/IMAGING: No results found for this or any previous visit (from the past 48 hours). No results found.  LIPID PANEL:    Component Value Date/Time   CHOL 148 11/18/2022 1006   CHOL 170 10/23/2020 0750   TRIG 89.0 11/18/2022 1006   HDL 41.70 11/18/2022 1006   HDL 40 10/23/2020 0750   CHOLHDL 4 11/18/2022 1006   VLDL 17.8 11/18/2022 1006   LDLCALC 89 11/18/2022 1006   LDLCALC 117 (H) 10/23/2020 0750   LDLCALC 127 (H) 08/03/2018 1106   LDLDIRECT 128.0 05/19/2023 1105    No results found for: LIPOA   WEIGHTS: Wt Readings from Last 3 Encounters:  09/16/23 243 lb (110.2 kg)  09/14/23 238 lb (108 kg)  08/23/23 234 lb (106.1 kg)    VITALS: BP 138/78   Pulse 76   Ht 5' 11 (1.803 m)   Wt 243 lb (110.2 kg)   SpO2 97%   BMI 33.89 kg/m   EXAM: Deferred  EKG: Deferred  ASSESSMENT: Dyslipidemia, goal LDL less than 70 Type 2 diabetes Hypertension  PLAN: 1.   Marvin [name] has a dyslipidemia Target LDL less than 70.  Since he is on amlodipine  his max FDA  approved dose for simvastatin  is 20 mg daily however he only takes it twice a week.  He did not indicate that he had issues with taking it more regularly  but was only prescribed to take it twice a week.  Based on that I advised him to increase it to daily.  Will also add ezetimibe .  This was previously marketed as Vytorin and is available as a generic combination if he tolerates them well.  I think a combination of both of these along with improve diet, increased exercise and some weight loss would get him to a target LDL less than 70.  Will plan repeat lipids including NMR and LP(a) in about 3 to 4 months.  Thanks again for the kind referral.  Vinie KYM Maxcy, MD, [facility], FNLA, FACP  [place]  [facility]  Medical Director of the Advanced Lipid Disorders &  Cardiovascular Risk Reduction Clinic Diplomate of the American Board of Clinical Lipidology Attending Cardiologist  Direct Dial: [phone]  Fax: [phone]  Website:  [email or web address]  Vinie BROCKS Latica Hohmann 09/16/2023, 10:11 PM

## 2023-09-16 NOTE — Patient Instructions (Signed)
 Medication Instructions:  CHANGE Simvastatin  to 20 mg daily START Zetia  10 mg daily  *If you need a refill on your cardiac medications before your next appointment, please call your pharmacy*  Lab Work: FASTING Lipids- NMR and LPa in 3 months If you have labs (blood work) drawn today and your tests are completely normal, you will receive your results only by: MyChart Message (if you have MyChart) OR A paper copy in the mail If you have any lab test that is abnormal or we need to change your treatment, we will call you to review the results.  Follow-Up: At [facility], you and your health needs are our priority.  As part of our continuing mission to provide you with exceptional heart care, our providers are all part of one team.  This team includes your primary Cardiologist (physician) and Advanced Practice Providers or APPs (Physician Assistants and Nurse Practitioners) who all work together to provide you with the care you need, when you need it.  Your next appointment:   TBD with Dr. Mona- based on lab results in 3 months

## 2023-10-03 ENCOUNTER — Other Ambulatory Visit: Payer: Self-pay | Admitting: Nurse Practitioner

## 2023-10-03 DIAGNOSIS — E790 Hyperuricemia without signs of inflammatory arthritis and tophaceous disease: Secondary | ICD-10-CM

## 2023-11-09 ENCOUNTER — Ambulatory Visit (INDEPENDENT_AMBULATORY_CARE_PROVIDER_SITE_OTHER): Admitting: Bariatrics

## 2023-11-09 ENCOUNTER — Encounter: Payer: Self-pay | Admitting: Bariatrics

## 2023-11-09 VITALS — BP 130/83 | HR 62 | Temp 97.8°F | Ht 71.0 in | Wt 236.0 lb

## 2023-11-09 DIAGNOSIS — Z7985 Long-term (current) use of injectable non-insulin antidiabetic drugs: Secondary | ICD-10-CM

## 2023-11-09 DIAGNOSIS — Z6832 Body mass index (BMI) 32.0-32.9, adult: Secondary | ICD-10-CM | POA: Diagnosis not present

## 2023-11-09 DIAGNOSIS — E559 Vitamin D deficiency, unspecified: Secondary | ICD-10-CM

## 2023-11-09 DIAGNOSIS — E1169 Type 2 diabetes mellitus with other specified complication: Secondary | ICD-10-CM

## 2023-11-09 DIAGNOSIS — E119 Type 2 diabetes mellitus without complications: Secondary | ICD-10-CM

## 2023-11-09 DIAGNOSIS — E66811 Obesity, class 1: Secondary | ICD-10-CM

## 2023-11-09 MED ORDER — SEMAGLUTIDE (2 MG/DOSE) 8 MG/3ML ~~LOC~~ SOPN: 2.0000 mg | PEN_INJECTOR | SUBCUTANEOUS | 0 refills | Status: DC

## 2023-11-09 MED ORDER — VITAMIN D (ERGOCALCIFEROL) 1.25 MG (50000 UNIT) PO CAPS
50000.0000 [IU] | ORAL_CAPSULE | ORAL | 0 refills | Status: DC
Start: 2023-11-09 — End: 2024-01-11

## 2023-11-09 NOTE — Progress Notes (Signed)
 WEIGHT SUMMARY AND BIOMETRICS  Weight Lost Since Last Visit: 2lb  Weight Gained Since Last Visit: 0   Vitals Temp: 97.8 F (36.6 C) BP: 130/83 Pulse Rate: 62 SpO2: 98 %   Anthropometric Measurements Height: 5' 11 (1.803 m) Weight: 236 lb (107 kg) BMI (Calculated): 32.93 Weight at Last Visit: 238lb Weight Lost Since Last Visit: 2lb Weight Gained Since Last Visit: 0 Starting Weight: 242lb Total Weight Loss (lbs): 6 lb (2.722 kg)   Body Composition  Body Fat %: 30.7 % Fat Mass (lbs): 72.6 lbs Muscle Mass (lbs): 156.2 lbs Total Body Water  (lbs): 112.4 lbs Visceral Fat Rating : 19   Other Clinical Data Fasting: no Labs: no Today's Visit #: 40 Starting Date: 07/04/20    OBESITY Marvin [name] is here to discuss his progress with his obesity treatment plan along with follow-up of his obesity related diagnoses.    Nutrition Plan: the Category 2 plan - 60% adherence.  Current exercise: walking and fishing  Interim History:  He is down 2 lbs since his last visit. He has not been fishing as much and is still recovering from his surgery.  Eating all of the food on the plan., Protein intake is as prescribed, Is not skipping meals, Water  intake is adequate., and Denies polyphagia   Pharmacotherapy: Ransome is on Ozempic  2 mg SQ weekly Adverse side effects: None Hunger is moderately controlled.  Cravings are moderately controlled.  Assessment/Plan:   Type II Diabetes HgbA1c is not at goal. Last A1c was 6.0 Episodes of hypoglycemia: no Medication(s): Ozempic  2 mg SQ weekly  Lab Results  Component Value Date   HGBA1C 6.0 05/19/2023   HGBA1C 5.9 11/18/2022   HGBA1C 5.9 (H) 07/02/2022   Lab Results  Component Value Date   MICROALBUR 1.1 08/03/2018   LDLCALC 89 11/18/2022   CREATININE 1.06 07/27/2023   Lab Results  Component Value Date   GFR 68.12  05/19/2023   GFR 77.58 11/18/2022   GFR 64.38 05/12/2022    Plan: Continue and refill Ozempic  2 mg SQ weekly Continue all other medications.  Will keep all carbohydrates low both sweets and starches.  Will continue exercise regimen to 30 to 60 minutes on most days of the week.  Aim for 7 to 9 hours of sleep nightly.  Eat more low glycemic index foods.  Will start journaling more consistent with sleep and will count his calories at 1,200 to 1,300 and protein at 80 to 100 g daily.   Vitamin D  Deficiency Vitamin D  is not at goal of 50.  Most recent vitamin D  level was 42.4. He is on  prescription ergocalciferol  50,000 IU weekly. Lab Results  Component Value Date   VD25OH 42.4 10/23/2020   VD25OH 7.3 (L) 07/04/2020    Plan: Refill prescription vitamin D  50,000 IU weekly.    Morbid Obesity: Current BMI BMI (Calculated): 32.93  Pharmacotherapy Plan Continue and refill  Ozempic  2 mg SQ weekly  Stacie is currently in the action stage of change. As such, his goal is to continue with weight loss efforts.  He has agreed to the Category 2 plan.  Exercise goals: Older adults should determine their level of effort for physical activity relative to their level of fitness.   Behavioral modification strategies: increasing lean protein intake, no meal skipping, meal planning , increase water  intake, better snacking choices, planning for success, increasing vegetables, keep healthy foods in the home, increase frequency of journaling, and mindful eating.  Kelby has agreed to follow-up with our clinic in 4 weeks.      Objective:   VITALS: Per patient if applicable, see vitals. GENERAL: Alert and in no acute distress. CARDIOPULMONARY: No increased WOB. Speaking in clear sentences.  PSYCH: Pleasant and cooperative. Speech normal rate and rhythm. Affect is appropriate. Insight and judgement are appropriate. Attention is focused, linear, and appropriate.  NEURO: Oriented as arrived to  appointment on time with no prompting.   Attestation Statements:   This was prepared with the assistance of Engineer, civil (consulting).  Occasional wrong-word or sound-a-like substitutions may have occurred due to the inherent limitations of voice recognition   Clayborne Daring, DO

## 2023-11-20 ENCOUNTER — Other Ambulatory Visit: Payer: Self-pay | Admitting: Nurse Practitioner

## 2023-11-20 DIAGNOSIS — I10 Essential (primary) hypertension: Secondary | ICD-10-CM

## 2023-11-23 ENCOUNTER — Ambulatory Visit: Admitting: Nurse Practitioner

## 2023-11-23 ENCOUNTER — Other Ambulatory Visit: Payer: Self-pay | Admitting: Nurse Practitioner

## 2023-11-25 ENCOUNTER — Other Ambulatory Visit (HOSPITAL_COMMUNITY): Payer: Self-pay

## 2023-11-26 ENCOUNTER — Other Ambulatory Visit (HOSPITAL_COMMUNITY): Payer: Self-pay

## 2023-12-01 ENCOUNTER — Ambulatory Visit (INDEPENDENT_AMBULATORY_CARE_PROVIDER_SITE_OTHER): Admitting: Nurse Practitioner

## 2023-12-01 ENCOUNTER — Encounter: Payer: Self-pay | Admitting: Nurse Practitioner

## 2023-12-01 VITALS — BP 128/82 | HR 78 | Temp 97.5°F | Ht 70.0 in | Wt 243.0 lb

## 2023-12-01 DIAGNOSIS — Z7985 Long-term (current) use of injectable non-insulin antidiabetic drugs: Secondary | ICD-10-CM

## 2023-12-01 DIAGNOSIS — R351 Nocturia: Secondary | ICD-10-CM | POA: Diagnosis not present

## 2023-12-01 DIAGNOSIS — K219 Gastro-esophageal reflux disease without esophagitis: Secondary | ICD-10-CM

## 2023-12-01 DIAGNOSIS — I10 Essential (primary) hypertension: Secondary | ICD-10-CM | POA: Diagnosis not present

## 2023-12-01 DIAGNOSIS — E785 Hyperlipidemia, unspecified: Secondary | ICD-10-CM

## 2023-12-01 DIAGNOSIS — E790 Hyperuricemia without signs of inflammatory arthritis and tophaceous disease: Secondary | ICD-10-CM

## 2023-12-01 DIAGNOSIS — E1169 Type 2 diabetes mellitus with other specified complication: Secondary | ICD-10-CM

## 2023-12-01 LAB — POCT GLYCOSYLATED HEMOGLOBIN (HGB A1C): Hemoglobin A1C: 5.6 % (ref 4.0–5.6)

## 2023-12-01 MED ORDER — AMLODIPINE BESYLATE 10 MG PO TABS: 10.0000 mg | ORAL_TABLET | Freq: Every day | ORAL | 3 refills | Status: AC

## 2023-12-01 MED ORDER — PANTOPRAZOLE SODIUM 20 MG PO TBEC: 20.0000 mg | DELAYED_RELEASE_TABLET | Freq: Every day | ORAL | 1 refills | Status: DC

## 2023-12-01 NOTE — Patient Instructions (Addendum)
 hgbA1c at 5.6% DIABETES is controlled Schedule fasting lab appointment in 2weeks. Need to be fasting 8hrs prior to blood draw. Ok to drink water  and take BP meds. Maintain Heart healthy diet and daily exercise. Maintain current medications.

## 2023-12-01 NOTE — Assessment & Plan Note (Signed)
 No gout exacerbation Current use of allopurinol  Repeat CMP and uric acid

## 2023-12-01 NOTE — Progress Notes (Signed)
 Established Patient Visit  Patient: Marvin [name]   DOB: [birth year]   73 y.o. Male  MRN: [id number] Visit Date: 12/01/2023  Subjective:    Chief Complaint  Patient presents with   Follow-up    FASTING 6 month follow up DM, HTN, Hyperlipidemia  DUE for foot exam, A1c, Urine ACR   HPI HTN (hypertension) BP at goal with amlodipine  BP Readings from Last 3 Encounters:  12/01/23 128/82  11/09/23 130/83  09/16/23 138/78   Maintain med dose Repeat CMP in 2weeks F/up in 6months  Hyperlipidemia associated with type 2 diabetes mellitus (HCC) Under the care of Dr. Mona Zetia  added to zocor  dose. Needs to repeat NMR profile and lipo A in 2weeks  DM (diabetes mellitus) (HCC) Controlled glucose with ozempic  2mg  weekly. No retinopathy or neuropathy or nephropathy Foot exam completed today Repeat hgbA1c today: 5.6% Obtain CMP and UACr in 2weeks  Hyperuricemia No gout exacerbation Current use of allopurinol  Repeat CMP and uric acid   Reviewed medical, surgical, and social history today  Medications: Outpatient Medications Prior to Visit  Medication Sig   acetaminophen  (TYLENOL ) 650 MG CR tablet Take 1,300 mg by mouth every other day.   allopurinol  (ZYLOPRIM ) 100 MG tablet TAKE 1 TABLET BY MOUTH DAILY   aspirin  EC 81 MG tablet Take 1 tablet (81 mg total) by mouth daily. Swallow whole.   cyclobenzaprine  (FLEXERIL ) 5 MG tablet Take 1 tablet (5 mg total) by mouth 3 (three) times daily as needed for muscle spasms.   ezetimibe  (ZETIA ) 10 MG tablet Take 1 tablet (10 mg total) by mouth daily.   fluticasone  (FLONASE ) 50 MCG/ACT nasal spray Place 2 sprays into both nostrils at bedtime.   Semaglutide , 2 MG/DOSE, 8 MG/3ML SOPN Inject 2 mg as directed once a week.   simvastatin  (ZOCOR ) 20 MG tablet Take 1 tablet (20 mg total) by mouth daily.   Vitamin D , Ergocalciferol , (DRISDOL ) 1.25 MG (50000 UNIT) CAPS capsule Take 1 capsule (50,000 Units total) by mouth every 7  (seven) days.   [DISCONTINUED] amLODipine  (NORVASC ) 10 MG tablet TAKE 1 TABLET BY MOUTH DAILY   [DISCONTINUED] pantoprazole  (PROTONIX ) 20 MG tablet TAKE 1 TABLET BY MOUTH DAILY   [DISCONTINUED] traMADol  (ULTRAM ) 50 MG tablet Take 1-2 tablets (50-100 mg total) by mouth every 6 (six) hours as needed for moderate pain (pain score 4-6) or severe pain (pain score 7-10). (Patient not taking: Reported on 12/01/2023)   No facility-administered medications prior to visit.   Reviewed past medical and social history.   ROS per HPI above      Objective:  BP 128/82 (BP Location: Right Arm, Patient Position: Sitting, Cuff Size: Large)   Pulse 78   Temp (!) 97.5 F (36.4 C) (Oral)   Ht 5' 10 (1.778 m)   Wt 243 lb (110.2 kg)   SpO2 97%   BMI 34.87 kg/m      Physical Exam Vitals and nursing note reviewed.  Cardiovascular:     Rate and Rhythm: Normal rate and regular rhythm.     Pulses: Normal pulses.     Heart sounds: Normal heart sounds.  Pulmonary:     Effort: Pulmonary effort is normal.     Breath sounds: Normal breath sounds.  Musculoskeletal:     Right lower leg: No edema.     Left lower leg: No edema.  Neurological:     Mental Status: He is alert and  oriented to person, place, and time.  Psychiatric:        Mood and Affect: Mood normal.        Behavior: Behavior normal.        Thought Content: Thought content normal.     Results for orders placed or performed in visit on 12/01/23  POCT glycosylated hemoglobin (Hb A1C)  Result Value Ref Range   Hemoglobin A1C 5.6 4.0 - 5.6 %   HbA1c POC (<> result, manual entry)     HbA1c, POC (prediabetic range)     HbA1c, POC (controlled diabetic range)        Assessment & Plan:    Problem List Items Addressed This Visit     DM (diabetes mellitus) (HCC)   Controlled glucose with ozempic  2mg  weekly. No retinopathy or neuropathy or nephropathy Foot exam completed today Repeat hgbA1c today: 5.6% Obtain CMP and UACr in 2weeks       Relevant Orders   POCT glycosylated hemoglobin (Hb A1C) (Completed)   Comprehensive metabolic panel with GFR   Microalbumin / creatinine urine ratio   HTN (hypertension) - Primary   BP at goal with amlodipine  BP Readings from Last 3 Encounters:  12/01/23 128/82  11/09/23 130/83  09/16/23 138/78   Maintain med dose Repeat CMP in 2weeks F/up in 6months      Relevant Medications   amLODipine  (NORVASC ) 10 MG tablet   Other Relevant Orders   Comprehensive metabolic panel with GFR   Hyperlipidemia associated with type 2 diabetes mellitus (HCC)   Under the care of Dr. Mona Zetia  added to zocor  dose. Needs to repeat NMR profile and lipo A in 2weeks      Relevant Medications   amLODipine  (NORVASC ) 10 MG tablet   Other Relevant Orders   Lipoprotein A (LPA)   NMR, lipoprofile   Comprehensive metabolic panel with GFR   Hyperuricemia   No gout exacerbation Current use of allopurinol  Repeat CMP and uric acid      Relevant Orders   Uric acid   Other Visit Diagnoses       Nocturia       Relevant Orders   PSA     Gastroesophageal reflux disease without esophagitis       Relevant Medications   pantoprazole  (PROTONIX ) 20 MG tablet      Return in about 6 months (around 05/31/2024) for DM, HTN, hyperlipidemia (fasting).     Roselie Mood, NP

## 2023-12-01 NOTE — Assessment & Plan Note (Signed)
 Controlled glucose with ozempic  2mg  weekly. No retinopathy or neuropathy or nephropathy Foot exam completed today Repeat hgbA1c today: 5.6% Obtain CMP and UACr in 2weeks

## 2023-12-01 NOTE — Assessment & Plan Note (Signed)
 Under the care of Dr. Mona Zetia  added to zocor  dose. Needs to repeat NMR profile and lipo A in 2weeks

## 2023-12-01 NOTE — Assessment & Plan Note (Addendum)
 BP at goal with amlodipine  BP Readings from Last 3 Encounters:  12/01/23 128/82  11/09/23 130/83  09/16/23 138/78   Maintain med dose Repeat CMP in 2weeks F/up in 6months

## 2023-12-07 ENCOUNTER — Emergency Department (HOSPITAL_COMMUNITY)

## 2023-12-07 ENCOUNTER — Encounter (HOSPITAL_COMMUNITY): Payer: Self-pay

## 2023-12-07 ENCOUNTER — Emergency Department (HOSPITAL_COMMUNITY)
Admission: EM | Admit: 2023-12-07 | Discharge: 2023-12-07 | Disposition: A | Discharge status: Home / Self Care | Attending: Emergency Medicine | Admitting: Emergency Medicine

## 2023-12-07 DIAGNOSIS — R531 Weakness: Secondary | ICD-10-CM | POA: Diagnosis not present

## 2023-12-07 DIAGNOSIS — R202 Paresthesia of skin: Secondary | ICD-10-CM | POA: Insufficient documentation

## 2023-12-07 DIAGNOSIS — I10 Essential (primary) hypertension: Secondary | ICD-10-CM | POA: Insufficient documentation

## 2023-12-07 DIAGNOSIS — Z79899 Other long term (current) drug therapy: Secondary | ICD-10-CM | POA: Insufficient documentation

## 2023-12-07 DIAGNOSIS — Z7982 Long term (current) use of aspirin: Secondary | ICD-10-CM | POA: Diagnosis not present

## 2023-12-07 DIAGNOSIS — M79601 Pain in right arm: Secondary | ICD-10-CM | POA: Diagnosis present

## 2023-12-07 LAB — CBC WITH DIFFERENTIAL/PLATELET
Abs Immature Granulocytes: 0.02 K/uL (ref 0.00–0.07)
Basophils Absolute: 0 K/uL (ref 0.0–0.1)
Basophils Relative: 0 %
Eosinophils Absolute: 0.3 K/uL (ref 0.0–0.5)
Eosinophils Relative: 3 %
HCT: 42.4 % (ref 39.0–52.0)
Hemoglobin: 13.6 g/dL (ref 13.0–17.0)
Immature Granulocytes: 0 %
Lymphocytes Relative: 18 %
Lymphs Abs: 1.6 K/uL (ref 0.7–4.0)
MCH: 26.1 pg (ref 26.0–34.0)
MCHC: 32.1 g/dL (ref 30.0–36.0)
MCV: 81.2 fL (ref 80.0–100.0)
Monocytes Absolute: 0.7 K/uL (ref 0.1–1.0)
Monocytes Relative: 8 %
Neutro Abs: 6.5 K/uL (ref 1.7–7.7)
Neutrophils Relative %: 71 %
Platelets: 203 K/uL (ref 150–400)
RBC: 5.22 MIL/uL (ref 4.22–5.81)
RDW: 15.1 % (ref 11.5–15.5)
WBC: 9.2 K/uL (ref 4.0–10.5)
nRBC: 0 % (ref 0.0–0.2)

## 2023-12-07 LAB — COMPREHENSIVE METABOLIC PANEL WITH GFR
ALT: 17 U/L (ref 0–44)
AST: 23 U/L (ref 15–41)
Albumin: 4.2 g/dL (ref 3.5–5.0)
Alkaline Phosphatase: 116 U/L (ref 38–126)
Anion gap: 11 (ref 5–15)
BUN: 16 mg/dL (ref 8–23)
CO2: 23 mmol/L (ref 22–32)
Calcium: 9.1 mg/dL (ref 8.9–10.3)
Chloride: 108 mmol/L (ref 98–111)
Creatinine, Ser: 1.02 mg/dL (ref 0.61–1.24)
GFR, Estimated: >60 mL/min (ref >60)
Glucose, Bld: 135 mg/dL — ABNORMAL HIGH (ref 70–99)
Potassium: 3.5 mmol/L (ref 3.5–5.1)
Sodium: 141 mmol/L (ref 135–145)
Total Bilirubin: 0.3 mg/dL (ref 0.0–1.2)
Total Protein: 6.6 g/dL (ref 6.5–8.1)

## 2023-12-07 LAB — TROPONIN T, HIGH SENSITIVITY
Troponin T High Sensitivity: <15 ng/L (ref 0–19)
Troponin T High Sensitivity: <15 ng/L (ref 0–19)

## 2023-12-07 MED ORDER — LORAZEPAM 2 MG/ML IJ SOLN
0.5000 mg | Freq: Once | INTRAMUSCULAR | Status: AC | PRN
  Administered 2023-12-07: 0.5 mg via INTRAVENOUS
  Filled 2023-12-07: qty 1

## 2023-12-07 MED ORDER — FENTANYL CITRATE (PF) 50 MCG/ML IJ SOSY
50.0000 ug | PREFILLED_SYRINGE | Freq: Once | INTRAMUSCULAR | Status: AC
  Administered 2023-12-07: 50 ug via INTRAVENOUS
  Filled 2023-12-07: qty 1

## 2023-12-07 NOTE — Discharge Instructions (Signed)
 Your history, exam, and workup today did not reveal a clear cardiac cause of your symptoms nor to the MRI of your neck show evidence of acute nerve compression at this time.  Please follow-up with your primary doctor for further evaluation and management and as we discussed, your symptoms may be related to your previous right arm injury.  Please rest and stay hydrated and use over-the-counter medications to help with your symptoms.

## 2023-12-07 NOTE — ED Provider Notes (Signed)
 Care assumed from Dr. Griselda.  At time of transfer of care, patient is waiting on cervical spine MRI results to look for concerning etiology of the patient's right arm symptoms.  If MRI is reassuring, anticipate plan for discharge home and outpatient follow-up.  7:41 AM Imaging returned without evidence of acute demyelination, stroke, mass, or evidence of cord compression.  There was some arthritis and some neuroforaminal stenosis on the left side but not on the right side.  We went through all of the workup and findings gather and feel the patient safe for discharge home and he agrees.  He attributes his symptoms may related to his previous right arm and bicep injury in the past that is flaring up.  Given his otherwise negative workup we feel he is safe for discharge home and agreed to have him use over-the-counter medications and follow-up with his primary team.  He had no other questions or concerns and will be discharged for outpatient follow-up.   Clinical Impression: 1. Pain of right upper extremity   2. Numbness and tingling of right arm     Disposition: Discharge  Condition: Good  I have discussed the results, Dx and Tx plan with the pt(& family if present). He/she/they expressed understanding and agree(s) with the plan. Discharge instructions discussed at great length. Strict return precautions discussed and pt &/or family have verbalized understanding of the instructions. No further questions at time of discharge.    New Prescriptions   No medications on file    Follow Up: Nche, Roselie Rockford, NP [address] [place] [address] [phone]     [facility] at [facility] [address] [place]  [phone]    [facility] [address] [place] [address] [phone]   If you have any new neck pain or worsening arm symptoms and want to follow-up with a  neck doctor.     Lene Mckay, Lonni PARAS, MD 12/07/23 [phone]

## 2023-12-07 NOTE — ED Provider Notes (Signed)
 Marvin [name] Provider Note   CSN: [id number] Arrival date & time: 12/07/23  9875     Patient presents with: Extremity Weakness   Marvin [name] is a 73 y.o. male.   The history is provided by the patient and the spouse.  Extremity Weakness  Marvin [name] is a 73 y.o. male who presents to the Emergency Department complaining of arm pain. He presents the emergency department accompanied by his wife for evaluation of one to two weeks of right arm pain. Pain is predominantly in the mid forearm to the mid upper arm. It is described as a soreness that is worse with range of motion. He feels we can that arm for about two weeks. He does not report any injuries but states that he noticed it when he was bass fishing. Tonight he started to get tingling in his right first through third digit and started to get sweating. No reported fever. No chest pain. He does reports some acid reflux and feels mildly short of breath. He also has pain that goes to his right neck and right shoulder for about one month. He has a history of hypertension, hyperlipidemia. He is right-hand dominant.     Prior to Admission medications   Medication Sig Start Date End Date Taking? Authorizing Provider  acetaminophen  (TYLENOL ) 650 MG CR tablet Take 1,300 mg by mouth every other day.    [provider]  allopurinol  (ZYLOPRIM ) 100 MG tablet TAKE 1 TABLET BY MOUTH DAILY 10/05/23   Nche, Roselie Rockford, NP  amLODipine  (NORVASC ) 10 MG tablet Take 1 tablet (10 mg total) by mouth daily. 12/01/23   Nche, Roselie Rockford, NP  aspirin  EC 81 MG tablet Take 1 tablet (81 mg total) by mouth daily. Swallow whole. 11/08/21   Nche, Roselie Rockford, NP  cyclobenzaprine  (FLEXERIL ) 5 MG tablet Take 1 tablet (5 mg total) by mouth 3 (three) times daily as needed for muscle spasms. 08/07/23   Sheldon Standing, MD  ezetimibe  (ZETIA ) 10 MG tablet Take 1 tablet (10 mg total) by mouth daily. 09/16/23    Hilty, Vinie BROCKS, MD  fluticasone  (FLONASE ) 50 MCG/ACT nasal spray Place 2 sprays into both nostrils at bedtime. 11/25/23   Nche, Roselie Rockford, NP  pantoprazole  (PROTONIX ) 20 MG tablet Take 1 tablet (20 mg total) by mouth daily. 12/01/23   Nche, Roselie Rockford, NP  Semaglutide , 2 MG/DOSE, 8 MG/3ML SOPN Inject 2 mg as directed once a week. 11/09/23   Delores Shields A, DO  simvastatin  (ZOCOR ) 20 MG tablet Take 1 tablet (20 mg total) by mouth daily. 09/16/23   Hilty, Vinie BROCKS, MD  Vitamin D , Ergocalciferol , (DRISDOL ) 1.25 MG (50000 UNIT) CAPS capsule Take 1 capsule (50,000 Units total) by mouth every 7 (seven) days. 11/09/23   Delores Shields A, DO    Allergies: Buprenorphine hcl and Morphine and codeine    Review of Systems  Musculoskeletal:  Positive for extremity weakness.  All other systems reviewed and are negative.   Updated Vital Signs BP 130/83   Pulse 68   Temp 97.6 F (36.4 C) (Oral)   Resp 17   Ht 5' 10 (1.778 m)   Wt 106.6 kg   SpO2 96%   BMI 33.72 kg/m   Physical Exam Vitals and nursing note reviewed.  Constitutional:      Appearance: He is well-developed.  HENT:     Head: Normocephalic and atraumatic.  Neck:     Comments: There is mild  tenderness palpation over the right lateral neck without any soft tissue swelling Cardiovascular:     Rate and Rhythm: Normal rate and regular rhythm.     Heart sounds: No murmur heard. Pulmonary:     Effort: Pulmonary effort is normal. No respiratory distress.     Breath sounds: Normal breath sounds.  Abdominal:     Palpations: Abdomen is soft.     Tenderness: There is no abdominal tenderness. There is no guarding or rebound.  Musculoskeletal:     Cervical back: Neck supple.     Comments: 2+ radial pulses bilaterally. There is mild tenderness to palpation over the right mid arm without any soft tissue swelling or skin changes. He is able to fully range at the wrist, elbow, shoulder  Skin:    General: Skin is warm and dry.   Neurological:     Mental Status: He is alert and oriented to person, place, and time.     Comments: 4+5 out of five strength in the right upper extremity in proximal and distal muscle groups. Sensation to light touch intact in bilateral upper extremities.  Psychiatric:        Behavior: Behavior normal.     (all labs ordered are listed, but only abnormal results are displayed) Labs Reviewed  COMPREHENSIVE METABOLIC PANEL WITH GFR - Abnormal; Notable for the following components:      Result Value   Glucose, Bld 135 (*)    All other components within normal limits  CBC WITH DIFFERENTIAL/PLATELET  TROPONIN T, HIGH SENSITIVITY  TROPONIN T, HIGH SENSITIVITY    EKG: EKG Interpretation Date/Time:  Monday December 07 2023 01:35:33 EDT Ventricular Rate:  68 PR Interval:  196 QRS Duration:  94 QT Interval:  445 QTC Calculation: 474 R Axis:   14  Text Interpretation: Sinus rhythm Abnormal R-wave progression, early transition Left ventricular hypertrophy Borderline T abnormalities, inferior leads Confirmed by Griselda Norris [id number]) on 12/07/2023 1:58:51 AM  Radiology: DG Chest 2 View Result Date: 12/07/2023 EXAM: 2 VIEW(S) XRAY OF THE CHEST 12/07/2023 02:21:02 AM COMPARISON: Chest x-ray 09/18/2021, CT chest 07/12/2012. CLINICAL HISTORY: chest pain, arm pain. right arm weakness over the last week, tonight he started noticing numbness as well FINDINGS: LUNGS AND PLEURA: Persistent elevation of the left hemidiaphragm. No focal pulmonary opacity. No pulmonary edema. No pleural effusion. No pneumothorax. HEART AND MEDIASTINUM: No acute abnormality of the cardiac and mediastinal silhouettes. BONES AND SOFT TISSUES: No acute osseous abnormality. IMPRESSION: 1. No acute cardiopulmonary process. Electronically signed by: Morgane Naveau MD 12/07/2023 02:30 AM EDT RP Workstation: [id number]     Procedures   Medications Ordered in the ED  LORazepam (ATIVAN) injection 0.5 mg (has no administration  in time range)  fentaNYL  (SUBLIMAZE ) injection 50 mcg (50 mcg Intravenous Given 12/07/23 0206)                                    Medical Decision Making Amount and/or Complexity of Data Reviewed Labs: ordered. Radiology: ordered.  Risk Prescription drug management.   Patient with history of hypertension, hyperlipidemia here for evaluation of right arm pain, paresthesias and weakness. He does have very minimally diminished strength in the right upper extremity when compared to the left with sensation that is intact. He is well perfused. No evidence of acute infectious process at this time. Chest x-ray is negative for acute abnormality - images personally reviewed and interpreted, agree with radiologist  interpretation. Current picture is not consistent with PE, ACS, pneumonia, dissection. Given distribution of symptoms concern for possible nerve compression. Plan to obtain MRI to further evaluate.     Final diagnoses:  None    ED Discharge Orders     None          Griselda Norris, MD 12/07/23 2020

## 2023-12-07 NOTE — ED Triage Notes (Addendum)
 Patient arrived with complaints of right arm weakness over the last week, tonight he started noticing numbness as well. Reporting some indigestion.

## 2023-12-11 ENCOUNTER — Ambulatory Visit: Payer: 59

## 2023-12-11 DIAGNOSIS — Z0000 Encounter for general adult medical examination without abnormal findings: Secondary | ICD-10-CM

## 2023-12-11 NOTE — Progress Notes (Signed)
 Subjective:   Marvin [name] is a 73 y.o. who presents for a Medicare Wellness preventive visit.  As a reminder, Annual Wellness Visits don't include a physical exam, and some assessments may be limited, especially if this visit is performed virtually. We may recommend an in-person follow-up visit with your provider if needed.  Visit Complete: Virtual I connected with  Marvin [name] on 12/11/23 by a audio enabled telemedicine application and verified that I am speaking with the correct person using two identifiers.  Patient Location: Home  Provider Location: Office/Clinic  I discussed the limitations of evaluation and management by telemedicine. The patient expressed understanding and agreed to proceed.  Vital Signs: Because this visit was a virtual/telehealth visit, some criteria may be missing or patient reported. Any vitals not documented were not able to be obtained and vitals that have been documented are patient reported.  VideoError- Librarian, academic were attempted between this provider and patient, however failed, due to patient having technical difficulties OR patient did not have access to video capability.  We continued and completed visit with audio only.   Persons Participating in Visit: Patient.  AWV Questionnaire: Yes: Patient Medicare AWV questionnaire was completed by the patient on 12/07/2023; I have confirmed that all information answered by patient is correct and no changes since this date.  Cardiac Risk Factors include: advanced age (>89men, >46 women);diabetes mellitus;dyslipidemia;hypertension;male gender     Objective:    Today's Vitals   There is no height or weight on file to calculate BMI.     12/11/2023   10:15 AM 12/07/2023    1:29 AM 08/07/2023    5:43 AM 07/27/2023    9:53 AM 12/05/2022   10:00 AM 07/15/2022    7:30 AM 07/02/2022    9:55 AM  Advanced Directives  Does Patient Have a Medical Advance Directive? No  No No No No No No  Would patient like information on creating a medical advance directive? No - Patient declined No - Patient declined No - Patient declined No - Patient declined  No - Patient declined     Current Medications (verified) Outpatient Encounter Medications as of 12/11/2023  Medication Sig   acetaminophen  (TYLENOL ) 650 MG CR tablet Take 1,300 mg by mouth every other day.   allopurinol  (ZYLOPRIM ) 100 MG tablet TAKE 1 TABLET BY MOUTH DAILY   amLODipine  (NORVASC ) 10 MG tablet Take 1 tablet (10 mg total) by mouth daily.   aspirin  EC 81 MG tablet Take 1 tablet (81 mg total) by mouth daily. Swallow whole.   cyclobenzaprine  (FLEXERIL ) 5 MG tablet Take 1 tablet (5 mg total) by mouth 3 (three) times daily as needed for muscle spasms.   ezetimibe  (ZETIA ) 10 MG tablet Take 1 tablet (10 mg total) by mouth daily.   fluticasone  (FLONASE ) 50 MCG/ACT nasal spray Place 2 sprays into both nostrils at bedtime.   pantoprazole  (PROTONIX ) 20 MG tablet Take 1 tablet (20 mg total) by mouth daily.   Semaglutide , 2 MG/DOSE, 8 MG/3ML SOPN Inject 2 mg as directed once a week.   simvastatin  (ZOCOR ) 20 MG tablet Take 1 tablet (20 mg total) by mouth daily.   Vitamin D , Ergocalciferol , (DRISDOL ) 1.25 MG (50000 UNIT) CAPS capsule Take 1 capsule (50,000 Units total) by mouth every 7 (seven) days.   No facility-administered encounter medications on file as of 12/11/2023.    Allergies (verified) Buprenorphine hcl and Morphine and codeine   History: Past Medical History:  Diagnosis Date  Alcohol  abuse    Allergy    Arthritis    Back pain    Cataract    Vision cloudy in lelf eye. 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   Swallowing difficulty    Wears dentures    bottom front   Past Surgical History:  Procedure Laterality Date   HERNIA REPAIR     twice   INCISIONAL HERNIA REPAIR N/A 08/07/2023   Procedure: REPAIR, HERNIA, INCISIONAL;  Surgeon: Sheldon Standing, MD;  Location: [facility];  Service: General;  Laterality: N/A;  INCISIONAL HERNIA REPAIR   INGUINAL HERNIA REPAIR Left 07/15/2022   Procedure: OPEN LEFT INGUINAL HERNIA REPAIR;  Surgeon: Gladis Cough, MD;  Location: [facility];  Service: General;  Laterality: Left;   JOINT REPLACEMENT     left inguinal hernia repair      left shoulder rotator cuff repair      REPAIR, HERNIA, INGUINAL, BILATERAL, ROBOT-ASSISTED Left 08/07/2023   Procedure: REPAIR, HERNIA, INGUINAL, BILATERAL, ROBOT-ASSISTED;  Surgeon: Sheldon Standing, MD;  Location: [facility];  Service: General;  Laterality: Left;  ROBOTIC LEFT AND POSSIBLE RIGHT INGUINAL HERNIA REPAIRS   REPLACEMENT TOTAL KNEE     partial   right knee replacement      SHOULDER SURGERY     right   TOTAL HIP ARTHROPLASTY Right 10/03/2019   Procedure: RIGHT TOTAL HIP ARTHROPLASTY ANTERIOR APPROACH;  Surgeon: Jerri Kay HERO, MD;  Location: [facility];  Service: Orthopedics;  Laterality: Right;   UMBILICAL HERNIA REPAIR     x 2   Family History  Problem Relation Age of Onset   Healthy Mother    Healthy Father    Cancer Brother        bone cancer   Cancer Maternal Aunt        breast cancer   Cancer Maternal Uncle        lukemia   Cancer Cousin        unknown   Cancer Maternal Uncle        lukemia   Cancer Paternal Uncle        prostate   Hearing loss Maternal Grandfather 35   Social History   Socioeconomic History   Marital status: Married    Spouse name: Not on file   Number of children: Not on file   Years of education: Not on file   Highest education level: Associate degree: academic program  Occupational History   Not on file  Tobacco Use   Smoking status: Former    Current  packs/day: 0.00    Types: Cigarettes    Quit date: 03/20/1998    Years since quitting: 25.7   Smokeless tobacco: Former    Types: Associate Professor status: Never Used  Substance and Sexual Activity   Alcohol  use: Not Currently    Comment: hx of 23 years ago   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  Other Topics  Concern   Not on file  Social History Narrative   Not on file   Social Drivers of Health   Financial Resource Strain: Low Risk  (12/11/2023)   Overall Financial Resource Strain (CARDIA)    Difficulty of Paying Living Expenses: Not hard at all  Food Insecurity: No Food Insecurity (12/11/2023)   Hunger Vital Sign    Worried About Running Out of Food in the Last Year: Never true    Ran Out of Food in the Last Year: Never true  Transportation Needs: No Transportation Needs (12/11/2023)   PRAPARE - Administrator, Civil Service (Medical): No    Lack of Transportation (Non-Medical): No  Physical Activity: Sufficiently Active (12/11/2023)   Exercise Vital Sign    Days of Exercise per Week: 7 days    Minutes of Exercise per Session: 30 min  Stress: No Stress Concern Present (12/11/2023)   [employer] of Occupational Health - Occupational Stress Questionnaire    Feeling of Stress: Only a little  Social Connections: Moderately Integrated (12/11/2023)   Social Connection and Isolation Panel    Frequency of Communication with Friends and Family: More than three times a week    Frequency of Social Gatherings with Friends and Family: More than three times a week    Attends Religious Services: More than 4 times per year    Active Member of [employer] or Organizations: No    Attends Engineer, structural: Never    Marital Status: Married    Tobacco Counseling Counseling given: Not Answered    Clinical Intake:  Pre-visit preparation completed: Yes  Pain :  No/denies pain     Nutritional Risks: None Diabetes: Yes CBG done?: No Did pt. bring in CBG monitor from home?: No  Lab Results  Component Value Date   HGBA1C 5.6 12/01/2023   HGBA1C 6.0 05/19/2023   HGBA1C 5.9 11/18/2022     How often do you need to have someone help you when you read instructions, pamphlets, or other written materials from your doctor or pharmacy?: 1 - Never  Interpreter Needed?: No  Information entered by :: NAllen LPN   Activities of Daily Living     12/07/2023   12:40 PM 08/07/2023    5:27 AM  In your present state of health, do you have any difficulty performing the following activities:  Hearing? 0 0  Vision? 0 0  Difficulty concentrating or making decisions? 0 0  Walking or climbing stairs? 0   Dressing or bathing? 0   Doing errands, shopping? 0   Preparing Food and eating ? N   Using the Toilet? N   In the past six months, have you accidently leaked urine? N   Do you have problems with loss of bowel control? N   Managing your Medications? N   Managing your Finances? N   Housekeeping or managing your Housekeeping? N     Patient Care Team: Nche, Roselie Rockford, NP as PCP - General (Internal Medicine) [facility], [facility]  Gladis Cough, MD as Attending Physician (General Surgery) Sheldon Standing, MD as Consulting Physician (General Surgery)  I have updated your Care Teams any recent Medical Services you may have received from other providers in the past year.     Assessment:   This is a routine wellness examination for Marvin [name].  Hearing/Vision screen Hearing Screening - Comments:: Denies hearing issues Vision Screening - Comments:: Regular eye exams, [facility]   Goals Addressed  This Visit's Progress    Patient Stated       12/11/2023, get back in the pool       Depression Screen     12/11/2023   10:16 AM 12/05/2022   10:01 AM 05/12/2022   10:06 AM 11/27/2021    9:59 AM 05/13/2021    8:06  AM 11/08/2020   11:05 AM 09/18/2020    9:56 AM  PHQ 2/9 Scores  PHQ - 2 Score 0 0 0 0 0 0 0  PHQ- 9 Score 0 0    1     Fall Risk     12/07/2023   12:40 PM 12/01/2022   11:12 AM 05/12/2022   10:06 AM 11/27/2021    9:58 AM 11/24/2021    4:40 PM  Fall Risk   Falls in the past year? 0 0 0 0 0  Number falls in past yr: 0 0 0 0   Injury with Fall? 0 0 0 0 0  Risk for fall due to : Medication side effect Medication side effect No Fall Risks No Fall Risks   Follow up Falls prevention discussed;Falls evaluation completed Falls prevention discussed;Falls evaluation completed Falls evaluation completed Falls prevention discussed       Data saved with a previous flowsheet row definition    MEDICARE RISK AT HOME:  Medicare Risk at Home Any stairs in or around the home?: (Patient-Rptd) Yes If so, are there any without handrails?: (Patient-Rptd) Yes Home free of loose throw rugs in walkways, pet beds, electrical cords, etc?: (Patient-Rptd) Yes Adequate lighting in your home to reduce risk of falls?: (Patient-Rptd) Yes Life alert?: (Patient-Rptd) No Use of a cane, walker or w/c?: (Patient-Rptd) No Grab bars in the bathroom?: (Patient-Rptd) Yes Shower chair or bench in shower?: (Patient-Rptd) No Elevated toilet seat or a handicapped toilet?: (Patient-Rptd) No  TIMED UP AND GO:  Was the test performed?  No  Cognitive Function: 6CIT completed        12/11/2023   10:18 AM 12/05/2022   10:02 AM 11/27/2021   10:02 AM 08/10/2019   10:44 AM  6CIT Screen  What Year? 0 points 0 points 0 points 0 points  What month? 0 points 0 points 0 points 0 points  What time? 0 points 0 points 0 points 0 points  Count back from 20 0 points 0 points 0 points 0 points  Months in reverse 0 points 0 points 0 points 0 points  Repeat phrase 0 points 2 points 0 points 0 points  Total Score 0 points 2 points 0 points 0 points    Immunizations Immunization History  Administered Date(s) Administered    sv,  Bivalent, Protein Subunit Rsvpref,pf (Abrysvo) 02/15/2022   Fluad Quad(high Dose 65+) 11/11/2019, 10/29/2020, 10/12/2021   INFLUENZA, HIGH DOSE SEASONAL PF 12/28/2016, 10/14/2018, 10/12/2021, 10/30/2022, 10/31/2022, 11/08/2023   Influenza,inj,Quad PF,6+ Mos 11/27/2016   Influenza-Unspecified 11/12/2015, 11/17/2017, 10/29/2020   Moderna Covid-19 Fall Seasonal Vaccine 21yrs & older 11/27/2021, 10/31/2022, 11/08/2023   Moderna Covid-19 Vaccine Bivalent Booster 60yrs & up 12/24/2020   Moderna SARS-COV2 Booster Vaccination 01/04/2020, 06/30/2020   Moderna Sars-Covid-2 Vaccination 04/01/2019, 04/29/2019   Pneumococcal Conjugate-13 02/24/2018   Pneumococcal Polysaccharide-23 10/14/2018   Tdap 08/24/2017   Unspecified SARS-COV-2 Vaccination 10/31/2022   Zoster Recombinant(Shingrix) 05/13/2022, 07/24/2022   Zoster, Live 12/06/2013    Screening Tests Health Maintenance  Topic Date Due   Diabetic kidney evaluation - Urine ACR  10/23/2021   OPHTHALMOLOGY EXAM  12/16/2023   COVID-19 Vaccine (7 -  Moderna risk 2025-26 season) 05/07/2024   HEMOGLOBIN A1C  05/31/2024   FOOT EXAM  11/30/2024   Diabetic kidney evaluation - eGFR measurement  12/06/2024   Medicare Annual Wellness (AWV)  12/10/2024   Colonoscopy  07/29/2027   DTaP/Tdap/Td (2 - Td or Tdap) 08/25/2027   Pneumococcal Vaccine: 50+ Years  Completed   Influenza Vaccine  Completed   Hepatitis C Screening  Completed   Zoster Vaccines- Shingrix  Completed   Meningococcal B Vaccine  Aged Out    Health Maintenance Items Addressed: Up to date  Additional Screening:  Vision Screening: Recommended annual ophthalmology exams for early detection of glaucoma and other disorders of the eye. Is the patient up to date with their annual eye exam?  Yes  Who is the provider or what is the name of the office in which the patient attends annual eye exams? [facility]  Dental Screening: Recommended annual dental exams for proper oral hygiene  Community  Resource Referral / Chronic Care Management: CRR required this visit?  No   CCM required this visit?  No   Plan:    I have personally reviewed and noted the following in the patient's chart:   Medical and social history Use of alcohol , tobacco or illicit drugs  Current medications and supplements including opioid prescriptions. Patient is not currently taking opioid prescriptions. Functional ability and status Nutritional status Physical activity Advanced directives List of other physicians Hospitalizations, surgeries, and ER visits in previous 12 months Vitals Screenings to include cognitive, depression, and falls Referrals and appointments  In addition, I have reviewed and discussed with patient certain preventive protocols, quality metrics, and best practice recommendations. A written personalized care plan for preventive services as well as general preventive health recommendations were provided to patient.   Ardella FORBES Dawn, LPN   [id number]   After Visit Summary: (MyChart) Due to this being a telephonic visit, the after visit summary with patients personalized plan was offered to patient via MyChart   Notes: Nothing significant to report at this time.

## 2023-12-11 NOTE — Patient Instructions (Signed)
 [name],  Thank you for taking the time for your Medicare Wellness Visit. I appreciate your continued commitment to your health goals. Please review the care plan we discussed, and feel free to reach out if I can assist you further.  Medicare recommends these wellness visits once per year to help you and your care team stay ahead of potential health issues. These visits are designed to focus on prevention, allowing your provider to concentrate on managing your acute and chronic conditions during your regular appointments.  Please note that Annual Wellness Visits do not include a physical exam. Some assessments may be limited, especially if the visit was conducted virtually. If needed, we may recommend a separate in-person follow-up with your provider.  Ongoing Care Seeing your primary care provider every 3 to 6 months helps us  monitor your health and provide consistent, personalized care.   Referrals If a referral was made during today's visit and you haven't received any updates within two weeks, please contact the referred provider directly to check on the status.  Recommended Screenings:  Health Maintenance  Topic Date Due   Yearly kidney health urinalysis for diabetes  10/23/2021   Eye exam for diabetics  12/16/2023   COVID-19 Vaccine (7 - Moderna risk 2025-26 season) 05/07/2024   Hemoglobin A1C  05/31/2024   Complete foot exam   11/30/2024   Yearly kidney function blood test for diabetes  12/06/2024   Medicare Annual Wellness Visit  12/10/2024   Colon Cancer Screening  07/29/2027   DTaP/Tdap/Td vaccine (2 - Td or Tdap) 08/25/2027   Pneumococcal Vaccine for age over 83  Completed   Flu Shot  Completed   Hepatitis C Screening  Completed   Zoster (Shingles) Vaccine  Completed   Meningitis B Vaccine  Aged Out       12/11/2023   10:15 AM  Advanced Directives  Does Patient Have a Medical Advance Directive? No  Would patient like information on creating a medical advance  directive? No - Patient declined   Advance Care Planning is important because it: Ensures you receive medical care that aligns with your values, goals, and preferences. Provides guidance to your family and loved ones, reducing the emotional burden of decision-making during critical moments.  Vision: Annual vision screenings are recommended for early detection of glaucoma, cataracts, and diabetic retinopathy. These exams can also reveal signs of chronic conditions such as diabetes and high blood pressure.  Dental: Annual dental screenings help detect early signs of oral cancer, gum disease, and other conditions linked to overall health, including heart disease and diabetes.  Please see the attached documents for additional preventive care recommendations.

## 2023-12-17 ENCOUNTER — Other Ambulatory Visit (INDEPENDENT_AMBULATORY_CARE_PROVIDER_SITE_OTHER)

## 2023-12-17 DIAGNOSIS — R351 Nocturia: Secondary | ICD-10-CM

## 2023-12-17 DIAGNOSIS — E785 Hyperlipidemia, unspecified: Secondary | ICD-10-CM | POA: Diagnosis not present

## 2023-12-17 DIAGNOSIS — E1169 Type 2 diabetes mellitus with other specified complication: Secondary | ICD-10-CM | POA: Diagnosis not present

## 2023-12-17 DIAGNOSIS — I10 Essential (primary) hypertension: Secondary | ICD-10-CM

## 2023-12-17 DIAGNOSIS — E790 Hyperuricemia without signs of inflammatory arthritis and tophaceous disease: Secondary | ICD-10-CM | POA: Diagnosis not present

## 2023-12-17 LAB — URIC ACID: Uric Acid, Serum: 6.1 mg/dL (ref 4.0–7.8)

## 2023-12-17 LAB — COMPREHENSIVE METABOLIC PANEL WITH GFR
ALT: 16 U/L (ref 0–53)
AST: 22 U/L (ref 0–37)
Albumin: 4.8 g/dL (ref 3.5–5.2)
Alkaline Phosphatase: 88 U/L (ref 39–117)
BUN: 15 mg/dL (ref 6–23)
CO2: 23 meq/L (ref 19–32)
Calcium: 9.4 mg/dL (ref 8.4–10.5)
Chloride: 105 meq/L (ref 96–112)
Creatinine, Ser: 1.08 mg/dL (ref 0.40–1.50)
GFR: 69.36 mL/min (ref >60.00)
Glucose, Bld: 93 mg/dL (ref 70–99)
Potassium: 4.4 meq/L (ref 3.5–5.1)
Sodium: 138 meq/L (ref 135–145)
Total Bilirubin: 0.6 mg/dL (ref 0.2–1.2)
Total Protein: 7.4 g/dL (ref 6.0–8.3)

## 2023-12-17 LAB — MICROALBUMIN / CREATININE URINE RATIO
Creatinine,U: 94.9 mg/dL
Microalb Creat Ratio: UNDETERMINED mg/g (ref 0.0–30.0)
Microalb, Ur: <0.7 mg/dL

## 2023-12-17 LAB — PSA: PSA: 2.06 ng/mL (ref 0.10–4.00)

## 2023-12-19 LAB — NMR, LIPOPROFILE
Cholesterol, Total: 129 mg/dL (ref 100–199)
HDL Particle Number: 33.5 umol/L (ref >=30.5)
HDL-C: 45 mg/dL (ref >39)
LDL Particle Number: 922 nmol/L (ref <1000)
LDL Size: 20.5 nm — ABNORMAL LOW (ref >20.5)
LDL-C (NIH Calc): 68 mg/dL (ref 0–99)
LP-IR Score: 63 — ABNORMAL HIGH (ref <=45)
Small LDL Particle Number: 382 nmol/L (ref <=527)
Triglycerides: 79 mg/dL (ref 0–149)

## 2023-12-21 ENCOUNTER — Ambulatory Visit: Payer: Self-pay | Admitting: Nurse Practitioner

## 2023-12-21 DIAGNOSIS — E1169 Type 2 diabetes mellitus with other specified complication: Secondary | ICD-10-CM

## 2023-12-21 DIAGNOSIS — R972 Elevated prostate specific antigen [PSA]: Secondary | ICD-10-CM

## 2023-12-23 LAB — LIPOPROTEIN A (LPA): Lipoprotein (a): 120 nmol/L — ABNORMAL HIGH (ref <75)

## 2024-01-06 ENCOUNTER — Ambulatory Visit: Payer: Self-pay | Admitting: Orthopedic Surgery

## 2024-01-06 ENCOUNTER — Other Ambulatory Visit (INDEPENDENT_AMBULATORY_CARE_PROVIDER_SITE_OTHER)

## 2024-01-06 ENCOUNTER — Encounter: Payer: Self-pay | Admitting: Orthopaedic Surgery

## 2024-01-06 ENCOUNTER — Ambulatory Visit: Admitting: Orthopaedic Surgery

## 2024-01-06 DIAGNOSIS — G8929 Other chronic pain: Secondary | ICD-10-CM

## 2024-01-06 DIAGNOSIS — M75101 Unspecified rotator cuff tear or rupture of right shoulder, not specified as traumatic: Secondary | ICD-10-CM

## 2024-01-06 DIAGNOSIS — M25511 Pain in right shoulder: Secondary | ICD-10-CM | POA: Diagnosis not present

## 2024-01-06 DIAGNOSIS — M19011 Primary osteoarthritis, right shoulder: Secondary | ICD-10-CM | POA: Diagnosis not present

## 2024-01-06 DIAGNOSIS — Z9889 Other specified postprocedural states: Secondary | ICD-10-CM | POA: Diagnosis not present

## 2024-01-06 MED ORDER — LIDOCAINE HCL 1 % IJ SOLN
3.0000 mL | INTRAMUSCULAR | Status: AC | PRN
  Administered 2024-01-06: 3 mL

## 2024-01-06 MED ORDER — METHYLPREDNISOLONE ACETATE 40 MG/ML IJ SUSP
40.0000 mg | INTRAMUSCULAR | Status: AC | PRN
  Administered 2024-01-06: 40 mg via INTRA_ARTICULAR

## 2024-01-06 MED ORDER — BUPIVACAINE HCL 0.5 % IJ SOLN
3.0000 mL | INTRAMUSCULAR | Status: AC | PRN
  Administered 2024-01-06: 3 mL via INTRA_ARTICULAR

## 2024-01-06 MED ORDER — LIDOCAINE HCL 1 % IJ SOLN
2.0000 mL | INTRAMUSCULAR | Status: AC | PRN
  Administered 2024-01-06: 2 mL

## 2024-01-06 MED ORDER — BUPIVACAINE HCL 0.25 % IJ SOLN
2.0000 mL | INTRAMUSCULAR | Status: AC | PRN
  Administered 2024-01-06: 2 mL via INTRA_ARTICULAR

## 2024-01-06 MED ORDER — BUPIVACAINE HCL 0.25 % IJ SOLN
1.0000 mL | INTRAMUSCULAR | Status: AC | PRN
  Administered 2024-01-06: 1 mL via INTRA_ARTICULAR

## 2024-01-06 MED ORDER — LIDOCAINE HCL 1 % IJ SOLN
1.0000 mL | INTRAMUSCULAR | Status: AC | PRN
  Administered 2024-01-06: 1 mL

## 2024-01-06 NOTE — Progress Notes (Signed)
 Marvin [name] pt

## 2024-01-06 NOTE — Progress Notes (Addendum)
 Office Visit Note   Patient: Marvin [name]           Date of Birth: 1950/09/06           MRN: [id number] Visit Date: 01/06/2024              Requested by: Katheen Roselie Rockford, NP [address] [place],  [place] [address] PCP: Katheen Roselie Rockford, NP   Assessment & Plan: Visit Diagnoses:  1. Chronic right shoulder pain   2. Osteoarthritis of right AC (acromioclavicular) joint   3. Tear of right supraspinatus tendon, partial   4. H/O repair of biceps tendon     Plan: Patient's pain consistent with both right AC osteoarthritis flare and right supraspinatus weakness/partial tearning/subacromial impingement as evidenced by significant hypoechoic fluid in subacromial bursa on ultrasound.  See ultrasound note for more details.  Complete right shoulder ultrasound completed at today's visit.  Patient would like to avoid surgery if all possible, interested in cortisone injections in both.  Proceed with ultrasound-guided right AC injection and ultrasound-guided right subacromial injection.  Patient tolerated procedure well.  Patient had immediate relief of all symptoms.  Will schedule tentative follow-up for 3 months for reevaluation of right shoulder pain s/p both injections today.  Patient in agreement with treatment plan.  No further questions or concerns at this time.  Follow-Up Instructions: No follow-ups on file.   Orders:  Orders Placed This Encounter  Procedures   Large Joint Inj   Medium Joint Inj   XR Shoulder Right   US  Extrem Up Right Ltd   No orders of the defined types were placed in this encounter.     Procedures: Large Joint Inj: R subacromial bursa on 01/06/2024 9:42 AM Indications: pain Details: 22 G 1.5 in needle, ultrasound-guided posterior approach Medications: 2 mL lidocaine  1 %; 2 mL bupivacaine  0.25 %; 40 mg methylPREDNISolone  acetate 40 MG/ML Outcome: tolerated well, no immediate complications     Procedure, treatment alternatives, risks and  benefits explained, specific risks discussed. Consent was given by the patient. Immediately prior to procedure a time out was called to verify the correct patient, procedure, equipment, support staff and site/side marked as required. Patient was prepped and draped in the usual sterile fashion.    Medium Joint Inj: R acromioclavicular on 01/06/2024 9:46 AM Indications: pain (Right AC Joint OA) Details: 25 G 1.5 in needle, ultrasound-guided anterior approach Medications: 1 mL lidocaine  1 %; 1 mL bupivacaine  0.25 %; 40 mg methylPREDNISolone  acetate 40 MG/ML Outcome: tolerated well, no immediate complications Procedure, treatment alternatives, risks and benefits explained, specific risks discussed. Immediately prior to procedure a time out was called to verify the correct patient, procedure, equipment, support staff and site/side marked as required. Patient was prepped and draped in the usual sterile fashion.    Large Joint Inj: R glenohumeral on 01/06/2024 6:31 PM Indications: pain Details: 22 G needle, ultrasound-guided  Arthrogram: No  Medications: 3 mL lidocaine  1 %; 3 mL bupivacaine  0.5 %; 40 mg methylPREDNISolone  acetate 40 MG/ML Outcome: tolerated well, no immediate complications Consent was given by the patient. Patient was prepped and draped in the usual sterile fashion.       Clinical Data: No additional findings.   Subjective: Chief Complaint  Patient presents with   Right Shoulder - Pain    HPI  Patient is a 73 year old male who presents today for evaluation of right biceps pain.  Patient has prior injury to right biceps that occurred many years ago (>  10 years ago).  Has been stable for the most part.  However, recently, he went to emergency room on 12/07/2023 due to pain in his right upper extremity that was significantly worse than times before.  Wanted to ensure there is no new injury or concerning neurological findings.  Patient had MRI of cervical spine completed  without evidence of acute demyelination, stroke, mass, or evidence of cord compression. There was some arthritis and some neuroforaminal stenosis on the left side but not on the right side.  Was discharged with instructions to follow-up with primary care/orthopedics for further evaluation.  Today patient reports he is primarily having pain with abduction of shoulder as well as difficulty sleeping with significant pain when lying on right side.  He also has inability to abduct and cross his arm over his body without significant pain over his AC joint.  Patient states symptoms have slowly been building up over the last couple of months.  No recent history of new trauma.  Review of Systems  Constitutional: Negative.   HENT: Negative.    Eyes: Negative.   Respiratory: Negative.    Cardiovascular: Negative.   Gastrointestinal: Negative.   Endocrine: Negative.   Genitourinary: Negative.   Skin: Negative.   Allergic/Immunologic: Negative.   Neurological: Negative.   Hematological: Negative.   Psychiatric/Behavioral: Negative.    All other systems reviewed and are negative.   ROS negative except as noted in HPI above   Objective: Vital Signs: There were no vitals taken for this visit.  Physical Exam Vitals and nursing note reviewed.  Constitutional:      Appearance: He is well-developed.  HENT:     Head: Normocephalic and atraumatic.  Eyes:     Pupils: Pupils are equal, round, and reactive to light.  Pulmonary:     Effort: Pulmonary effort is normal.  Abdominal:     Palpations: Abdomen is soft.  Musculoskeletal:        General: Normal range of motion.     Cervical back: Neck supple.  Skin:    General: Skin is warm.  Neurological:     Mental Status: He is alert and oriented to person, place, and time.  Psychiatric:        Behavior: Behavior normal.        Thought Content: Thought content normal.        Judgment: Judgment normal.     Ortho Exam  Right Shoulder: On  inspection of left shoulder no evidence of erythema, ecchymosis, or edema present.  Has TTP over R AC joint, acromion, but not bicipital groove of humerus.  Patient has full active/passive range of motion of shoulder in flexion, extension, abduction, adduction, internal and external rotation.  Limited range of motion in abduction and cross-body adduction 2/2 severe pain.   Rotator cuff strength testing is 5/5 bilaterally in all planes of motion with the exception of R supraspinatus, 4/5.  Neurovascularly intact distally with no radiation of pain into forearms/hands.  Empty can testing negative for weakness positive for pain.  O'Brien's negative.  Hawkins negative.  Neer's positive.  Apprehension test negative.  Cross body adduction testing positive.  Specialty Comments:  No specialty comments available.  Imaging: US  Extrem Up Right Ltd Result Date: 01/06/2024 Complete ultrasound of right shoulder shows the following: Biceps tendon viewed in short and long axis.  Evidence of tendinosis with partial flaring but still fully intact fibers with no evidence of complete tearing or tissue irregularity.  No surrounding hypoechoic changes Pectoralis major  insertion on humerus viewed in long axis with no evidence of tearing or tissue irregularity. Subscap lesion long axis.  No evidence of tissue abnormality, hypoechoic changes or tearing. Supraspinatus viewed in long axis and short axis.  Evidence of partial thickness tearing with hypoechoic changes and surrounding subacromial bursa inflammation at footplate attachment on the greater tuberosity of humerus. Infraspinatus.  Long and short axis.  Evidence of mild tendinosis with mild tissue irregularity but no evidence of tearing or significant hypoechoic changes AC joint viewed in long axis.  Significant hypoechoic changes, additional calcification, and geyser sign present.  Evidence of severe right AC osteoarthritis. Glenohumeral joint reviewed.  Evidence of cortical  irregularity of humerus consistent with mild glenohumeral osteoarthritis.  XR Shoulder Right Result Date: 01/06/2024 X-ray of right shoulder shows no acute osseous abnormalities.  No evidence of fracture or dislocation.  Does show evidence of moderate/severe right AC osteoarthritis.  Mild glenohumeral osteoarthritis.    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   Swallowing difficulty    Wears dentures    bottom front    Family History  Problem Relation Age of Onset   Healthy Mother    Healthy Father    Cancer Brother        bone cancer   Cancer Maternal Aunt        breast cancer   Cancer Maternal Uncle        lukemia   Cancer Cousin        unknown    Cancer Maternal Uncle        lukemia   Cancer Paternal Uncle        prostate   Hearing loss Maternal Grandfather 23    Past Surgical History:  Procedure Laterality Date   HERNIA REPAIR     twice   INCISIONAL HERNIA REPAIR N/A 08/07/2023   Procedure: REPAIR, HERNIA, INCISIONAL;  Surgeon: Sheldon Standing, MD;  Location: [facility];  Service: General;  Laterality: N/A;  INCISIONAL HERNIA REPAIR   INGUINAL HERNIA REPAIR Left 07/15/2022   Procedure: OPEN LEFT INGUINAL HERNIA REPAIR;  Surgeon:  Gladis Cough, MD;  Location: [facility];  Service: General;  Laterality: Left;   JOINT REPLACEMENT     left inguinal hernia repair      left shoulder rotator cuff repair      REPAIR, HERNIA, INGUINAL, BILATERAL, ROBOT-ASSISTED Left 08/07/2023   Procedure: REPAIR, HERNIA, INGUINAL, BILATERAL, ROBOT-ASSISTED;  Surgeon: Sheldon Standing, MD;  Location: [facility];  Service: General;  Laterality: Left;  ROBOTIC LEFT AND POSSIBLE RIGHT INGUINAL HERNIA REPAIRS   REPLACEMENT TOTAL KNEE     partial   right knee replacement      SHOULDER SURGERY     right   TOTAL HIP ARTHROPLASTY Right 10/03/2019   Procedure: RIGHT TOTAL HIP ARTHROPLASTY ANTERIOR APPROACH;  Surgeon: Jerri Kay HERO, MD;  Location: [facility];  Service: Orthopedics;  Laterality: Right;   UMBILICAL HERNIA REPAIR     x 2   Social History   Occupational History   Not on file  Tobacco Use   Smoking status: Former    Current packs/day: 0.00    Types: Cigarettes    Quit date: 03/20/1998    Years since quitting: 25.8   Smokeless tobacco: Former    Types: Associate Professor status: Never Used  Substance and Sexual Activity   Alcohol  use: Not Currently    Comment: hx of 23 years ago   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

## 2024-01-11 ENCOUNTER — Ambulatory Visit (INDEPENDENT_AMBULATORY_CARE_PROVIDER_SITE_OTHER): Admitting: Bariatrics

## 2024-01-11 ENCOUNTER — Encounter: Payer: Self-pay | Admitting: Bariatrics

## 2024-01-11 VITALS — BP 133/81 | HR 70 | Ht 71.0 in | Wt 232.0 lb

## 2024-01-11 DIAGNOSIS — Z7985 Long-term (current) use of injectable non-insulin antidiabetic drugs: Secondary | ICD-10-CM

## 2024-01-11 DIAGNOSIS — E1169 Type 2 diabetes mellitus with other specified complication: Secondary | ICD-10-CM

## 2024-01-11 DIAGNOSIS — E559 Vitamin D deficiency, unspecified: Secondary | ICD-10-CM | POA: Diagnosis not present

## 2024-01-11 DIAGNOSIS — E119 Type 2 diabetes mellitus without complications: Secondary | ICD-10-CM | POA: Diagnosis not present

## 2024-01-11 DIAGNOSIS — E669 Obesity, unspecified: Secondary | ICD-10-CM | POA: Diagnosis not present

## 2024-01-11 DIAGNOSIS — E66811 Obesity, class 1: Secondary | ICD-10-CM

## 2024-01-11 DIAGNOSIS — Z6832 Body mass index (BMI) 32.0-32.9, adult: Secondary | ICD-10-CM

## 2024-01-11 MED ORDER — SEMAGLUTIDE (2 MG/DOSE) 8 MG/3ML ~~LOC~~ SOPN: 2.0000 mg | PEN_INJECTOR | SUBCUTANEOUS | 0 refills | Status: DC

## 2024-01-11 MED ORDER — VITAMIN D (ERGOCALCIFEROL) 1.25 MG (50000 UNIT) PO CAPS: 50000.0000 [IU] | ORAL_CAPSULE | ORAL | 0 refills | Status: DC

## 2024-01-11 NOTE — Progress Notes (Signed)
 WEIGHT SUMMARY AND BIOMETRICS  Weight Lost Since Last Visit: 4lb  Weight Gained Since Last Visit: 0   Vitals BP: 133/81 Pulse Rate: 70 SpO2: 99 %   Anthropometric Measurements Height: 5' 11 (1.803 m) Weight: 232 lb (105.2 kg) BMI (Calculated): 32.37 Weight at Last Visit: 236lb Weight Lost Since Last Visit: 4lb Weight Gained Since Last Visit: 0 Starting Weight: 242lb Total Weight Loss (lbs): 10 lb (4.536 kg)   Body Composition  Body Fat %: 29.9 % Fat Mass (lbs): 69.4 lbs Muscle Mass (lbs): 154.6 lbs Total Body Water  (lbs): 108.4 lbs Visceral Fat Rating : 18   Other Clinical Data Fasting: no Labs: no Today's Visit #: 68 Starting Date: 07/04/20    OBESITY Marvin [name] is here to discuss his progress with his obesity treatment plan along with follow-up of his obesity related diagnoses.    Nutrition Plan: the Category 2 plan - 60% adherence.  Current exercise: walking and fishing  Interim History:  He is down 4 lbs since his last visit. His labs are up-to-date.  Eating all of the food on the plan., Protein intake is as prescribed, Is not skipping meals, and Water  intake is adequate.   Pharmacotherapy: Marvin [name] is on Ozempic  2 mg SQ weekly Adverse side effects: None Hunger is moderately controlled.  Cravings are moderately controlled.  Assessment/Plan:   Vitamin D  Deficiency Vitamin D  is not at goal of 50.  Most recent vitamin D  level was 42.4. He is on  prescription ergocalciferol  50,000 IU weekly. Lab Results  Component Value Date   VD25OH 42.4 10/23/2020   VD25OH 7.3 (L) 07/04/2020    Plan: Refill prescription vitamin D  50,000 IU weekly.   Type II Diabetes HgbA1c is not at goal. Last A1c was 5.6 Episodes of hypoglycemia: no Medication(s): Ozempic  2 mg SQ weekly  Lab Results  Component Value Date   HGBA1C 5.6 12/01/2023   HGBA1C 6.0  05/19/2023   HGBA1C 5.9 11/18/2022   Lab Results  Component Value Date   MICROALBUR <0.7 12/17/2023   LDLCALC 89 11/18/2022   CREATININE 1.08 12/17/2023   Lab Results  Component Value Date   GFR 69.36 12/17/2023   GFR 68.12 05/19/2023   GFR 77.58 11/18/2022    Plan: Continue and refill Ozempic  2 mg SQ weekly Continue all other medications.  Will keep all carbohydrates low both sweets and starches.  Will continue exercise regimen to 30 to 60 minutes on most days of the week.  Strategies for the holidays.     Generalized Obesity: Current BMI BMI (Calculated): 32.37   Pharmacotherapy Plan Continue and refill  Ozempic  2 mg SQ weekly  Marvin [name] is currently in the action stage of change. As such, his goal is to continue with weight loss efforts.  He has agreed to the Category 2 plan.  Exercise goals: Older adults should determine their level of effort  for physical activity relative to their level of fitness.   Behavioral modification strategies: increasing lean protein intake, no meal skipping, meal planning , better snacking choices, planning for success, avoiding temptations, keep healthy foods in the home, weigh protein portions, and measure portion sizes.  Marvin [name] has agreed to follow-up with our clinic in 2 months.    Objective:   VITALS: Per patient if applicable, see vitals. GENERAL: Alert and in no acute distress. CARDIOPULMONARY: No increased WOB. Speaking in clear sentences.  PSYCH: Pleasant and cooperative. Speech normal rate and rhythm. Affect is appropriate. Insight and judgement are appropriate. Attention is focused, linear, and appropriate.  NEURO: Oriented as arrived to appointment on time with no prompting.   Attestation Statements:   This was prepared with the assistance of Engineer, Civil (consulting).  Occasional wrong-word or sound-a-like substitutions may have occurred due to the inherent limitations of voice recognition   Marvin Daring, DO

## 2024-01-18 ENCOUNTER — Telehealth: Payer: Self-pay | Admitting: Nurse Practitioner

## 2024-01-18 NOTE — Telephone Encounter (Unsigned)
 Copied from CRM #[id number]. Topic: Clinical - Medication Refill >> Jan 18, 2024  5:55 PM Drema MATSU wrote: Medication: cyclobenzaprine  (FLEXERIL ) 5 MG tablet   Has the patient contacted their pharmacy? Yes (Agent: If no, request that the patient contact the pharmacy for the refill. If patient does not wish to contact the pharmacy document the reason why and proceed with request.) needs permission from doctor  (Agent: If yes, when and what did the pharmacy advise?)  This is the patient's preferred pharmacy:  [facility] [id number] [place], [place] - [address] [place] [address] Phone: [phone] Fax: [phone]  Is this the correct pharmacy for this prescription? Yes If no, delete pharmacy and type the correct one.   Has the prescription been filled recently? Yes  Is the patient out of the medication? Yes  Has the patient been seen for an appointment in the last year OR does the patient have an upcoming appointment? Yes  Can we respond through MyChart? Yes  Agent: Please be advised that Rx refills may take up to 3 business days. We ask that you follow-up with your pharmacy.

## 2024-01-31 ENCOUNTER — Other Ambulatory Visit: Payer: Self-pay | Admitting: Nurse Practitioner

## 2024-01-31 DIAGNOSIS — E790 Hyperuricemia without signs of inflammatory arthritis and tophaceous disease: Secondary | ICD-10-CM

## 2024-02-08 ENCOUNTER — Other Ambulatory Visit: Payer: Self-pay | Admitting: Nurse Practitioner

## 2024-02-08 DIAGNOSIS — K219 Gastro-esophageal reflux disease without esophagitis: Secondary | ICD-10-CM

## 2024-02-24 ENCOUNTER — Ambulatory Visit (INDEPENDENT_AMBULATORY_CARE_PROVIDER_SITE_OTHER): Admitting: Physician Assistant

## 2024-02-24 ENCOUNTER — Encounter: Payer: Self-pay | Admitting: Physician Assistant

## 2024-02-24 DIAGNOSIS — M25511 Pain in right shoulder: Secondary | ICD-10-CM | POA: Diagnosis not present

## 2024-02-24 DIAGNOSIS — G8929 Other chronic pain: Secondary | ICD-10-CM

## 2024-02-24 MED ORDER — TRAMADOL HCL 50 MG PO TABS: 50.0000 mg | ORAL_TABLET | Freq: Two times a day (BID) | ORAL | 1 refills | Status: AC | PRN

## 2024-02-24 MED ORDER — DIAZEPAM 2 MG PO TABS: ORAL_TABLET | ORAL | 0 refills | Status: AC

## 2024-02-24 NOTE — Addendum Note (Signed)
 Addended by: Quinn Quam L on: 02/24/2024 02:44 PM   Modules accepted: Orders

## 2024-02-24 NOTE — Progress Notes (Signed)
 "  Office Visit Note   Patient: Marvin [name]           Date of Birth: 02/03/1951           MRN: [id number] Visit Date: 02/24/2024              Requested by: Katheen Roselie Rockford, NP [address] [place],  [place] [address] PCP: Katheen Roselie Rockford, NP   Assessment & Plan: Visit Diagnoses:  1. Chronic right shoulder pain     Plan: Impression is chronic right shoulder pain and subjective weakness.  Patient symptoms have been ongoing for a while.  Symptoms only temporary resolved with subacromial cortisone injection.  At this point, would like to order an MRI to assess for rotator cuff pathology.  He will follow-up with Dr. Jerri once this has been completed.  Follow-Up Instructions: Return for f/u with xu after mri right shoulder.   Orders:  No orders of the defined types were placed in this encounter.  No orders of the defined types were placed in this encounter.     Procedures: No procedures performed   Clinical Data: No additional findings.   Subjective: Chief Complaint  Patient presents with   Right Shoulder - Pain    HPI patient is a pleasant 74 year old gentleman who comes in today with recurrent right shoulder pain.  He has had off-and-on pain for several years after injuring his biceps.  He had worsening pain back in October and was seen in the ED.  He was then seen in our office about 6 weeks or so ago he underwent right shoulder subacromial cortisone injection.  He had great relief but unfortunately this only lasted for about a month.  He is here today with recurrent pain.  Most of his pain is in the deltoid and some in the biceps.  Worse with movements of the shoulder.  Does get a sharp shooting pain with sudden movements.  He has not been taking anything for the pain.  Review of Systems as detailed in HPI.  All others reviewed and are negative.   Objective: Vital Signs: There were no vitals taken for this visit.  Physical Exam well-developed  well-nourished gentleman in no acute distress.  Alert and oriented x 3.  Ortho Exam right shoulder exam: Forward flexion approximate 160 degrees.  Abduction to approximately 100 degrees.  Pain with empty can testing.  He has full strength throughout.  Neurovascular intact distally.  Specialty Comments:  No specialty comments available.  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   Swallowing difficulty    Wears dentures    bottom front    Family History  Problem Relation Age of Onset   Healthy Mother    Healthy Father    Cancer Brother        bone cancer   Cancer Maternal Aunt        breast cancer   Cancer Maternal Uncle         lukemia   Cancer Cousin        unknown   Cancer Maternal Uncle        lukemia   Cancer Paternal Uncle        prostate   Hearing loss Maternal Grandfather 4    Past Surgical History:  Procedure Laterality Date   HERNIA REPAIR     twice   INCISIONAL HERNIA REPAIR N/A 08/07/2023   Procedure: REPAIR, HERNIA, INCISIONAL;  Surgeon: Sheldon Standing, MD;  Location: [facility];  Service: General;  Laterality: N/A;  INCISIONAL HERNIA REPAIR   INGUINAL HERNIA REPAIR Left 07/15/2022   Procedure: OPEN LEFT INGUINAL HERNIA REPAIR;  Surgeon: Gladis Cough, MD;  Location: [facility];  Service: General;  Laterality: Left;   JOINT REPLACEMENT     left inguinal hernia repair      left shoulder rotator cuff repair      REPAIR, HERNIA, INGUINAL, BILATERAL, ROBOT-ASSISTED Left 08/07/2023   Procedure: REPAIR, HERNIA, INGUINAL, BILATERAL, ROBOT-ASSISTED;  Surgeon: Sheldon Standing, MD;  Location: [facility];  Service: General;  Laterality: Left;  ROBOTIC LEFT AND POSSIBLE RIGHT INGUINAL HERNIA REPAIRS   REPLACEMENT TOTAL KNEE     partial   right knee replacement      SHOULDER SURGERY     right   TOTAL HIP ARTHROPLASTY Right 10/03/2019   Procedure: RIGHT TOTAL HIP ARTHROPLASTY ANTERIOR APPROACH;  Surgeon: Jerri Kay HERO, MD;  Location: [facility];  Service: Orthopedics;  Laterality: Right;   UMBILICAL HERNIA REPAIR     x 2   Social History   Occupational History   Not on file  Tobacco Use   Smoking status: Former    Current packs/day: 0.00    Types: Cigarettes    Quit date: 03/20/1998    Years since quitting: 25.9   Smokeless tobacco: Former    Types: Associate Professor status: Never Used  Substance and Sexual Activity   Alcohol  use: Not Currently    Comment: hx of 23 years ago   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        "

## 2024-03-01 ENCOUNTER — Encounter: Payer: Self-pay | Admitting: Physician Assistant

## 2024-03-04 ENCOUNTER — Encounter: Payer: Self-pay | Admitting: Podiatry

## 2024-03-04 ENCOUNTER — Encounter: Payer: Self-pay | Admitting: Physician Assistant

## 2024-03-04 ENCOUNTER — Ambulatory Visit
Admission: RE | Admit: 2024-03-04 | Discharge: 2024-03-04 | Disposition: A | Discharge status: Home / Self Care | Source: Ambulatory Visit | Attending: Physician Assistant | Admitting: Physician Assistant

## 2024-03-04 DIAGNOSIS — G8929 Other chronic pain: Secondary | ICD-10-CM

## 2024-03-07 ENCOUNTER — Ambulatory Visit: Payer: Self-pay | Admitting: Physician Assistant

## 2024-03-07 ENCOUNTER — Encounter: Payer: Self-pay | Admitting: Bariatrics

## 2024-03-07 ENCOUNTER — Ambulatory Visit: Admitting: Bariatrics

## 2024-03-07 VITALS — BP 142/85 | HR 74 | Ht 71.0 in | Wt 235.0 lb

## 2024-03-07 DIAGNOSIS — E119 Type 2 diabetes mellitus without complications: Secondary | ICD-10-CM

## 2024-03-07 DIAGNOSIS — E669 Obesity, unspecified: Secondary | ICD-10-CM | POA: Diagnosis not present

## 2024-03-07 DIAGNOSIS — Z6832 Body mass index (BMI) 32.0-32.9, adult: Secondary | ICD-10-CM | POA: Diagnosis not present

## 2024-03-07 DIAGNOSIS — E559 Vitamin D deficiency, unspecified: Secondary | ICD-10-CM

## 2024-03-07 DIAGNOSIS — Z7985 Long-term (current) use of injectable non-insulin antidiabetic drugs: Secondary | ICD-10-CM | POA: Diagnosis not present

## 2024-03-07 DIAGNOSIS — E1169 Type 2 diabetes mellitus with other specified complication: Secondary | ICD-10-CM

## 2024-03-07 MED ORDER — VITAMIN D (ERGOCALCIFEROL) 1.25 MG (50000 UNIT) PO CAPS: 50000.0000 [IU] | ORAL_CAPSULE | ORAL | 0 refills | Status: AC

## 2024-03-07 MED ORDER — SEMAGLUTIDE (2 MG/DOSE) 8 MG/3ML ~~LOC~~ SOPN: 2.0000 mg | PEN_INJECTOR | SUBCUTANEOUS | 0 refills | Status: AC

## 2024-03-07 NOTE — Progress Notes (Signed)
 "                                                                                                             WEIGHT SUMMARY AND BIOMETRICS  Weight Lost Since Last Visit: 0  Weight Gained Since Last Visit: 3lb   Vitals BP: (!) 142/85 Pulse Rate: 74 SpO2: 99 %   Anthropometric Measurements Height: 5' 11 (1.803 m) Weight: 235 lb (106.6 kg) BMI (Calculated): 32.79 Weight at Last Visit: 232lb Weight Lost Since Last Visit: 0 Weight Gained Since Last Visit: 3lb Starting Weight: 242lb Total Weight Loss (lbs): 7 lb (3.175 kg)   Body Composition  Body Fat %: 30.7 % Fat Mass (lbs): 72.2 lbs Muscle Mass (lbs): 155 lbs Total Body Water  (lbs): 110.8 lbs Visceral Fat Rating : 19   Other Clinical Data Fasting: no Labs: no Today's Visit #: 54 Starting Date: 07/04/20    OBESITY Marvin [name] is here to discuss his progress with his obesity treatment plan along with follow-up of his obesity related diagnoses.    Nutrition Plan: the Category 2 plan - 60% adherence.  Current exercise: walking  Interim History:  He is up 3 lbs since his last visit. He has been having pain in his right shoulder and is seeing an orthopaedist.  Eating all of the food on the plan., Protein intake is as prescribed, and Water  intake is adequate.   Pharmacotherapy: Marvin [name] is on Ozempic  2 mg SQ weekly Adverse side effects: None Hunger is moderately controlled.  Cravings are moderately controlled.  Assessment/Plan:   Vitamin D  Deficiency Vitamin D  is not at goal of 50.  Most recent vitamin D  level was 424. He is on  prescription ergocalciferol  50,000 IU weekly. Lab Results  Component Value Date   VD25OH 42.4 10/23/2020   VD25OH 7.3 (L) 07/04/2020    Plan: Refill prescription vitamin D  50,000 IU weekly.   Type II Diabetes HgbA1c is at goal. Last A1c was 5.6 CBGs: Not checking      Episodes of hypoglycemia: no Medication(s): Ozempic  2 mg SQ weekly  Lab Results  Component Value Date    HGBA1C 5.6 12/01/2023   HGBA1C 6.0 05/19/2023   HGBA1C 5.9 11/18/2022   Lab Results  Component Value Date   MICROALBUR <0.7 12/17/2023   LDLCALC 89 11/18/2022   CREATININE 1.08 12/17/2023   Lab Results  Component Value Date   GFR 69.36 12/17/2023   GFR 68.12 05/19/2023   GFR 77.58 11/18/2022    Plan: Continue and refill Ozempic  2 mg SQ weekly Continue all other medications.  Will keep all carbohydrates low both sweets and starches.  Will continue to fish, and walk. Will follow-up with his orthopaedist for his right shoulder.  Aim for 7 to 9 hours of sleep nightly.  Eat more low glycemic index foods.  Will continue to cook at home.  Will plan meals with his wife.    Generalized Obesity: Current BMI BMI (Calculated): 32.79   Pharmacotherapy Plan Continue and refill  Ozempic  2 mg SQ weekly  Marvin [name] is  currently in the action stage of change. As such, his goal is to continue with weight loss efforts.  He has agreed to the Category 2 plan.  Exercise goals: Older adults with chronic conditions should understand whether and how their conditions affect their ability to do regular physical activity safely.  Behavioral modification strategies: increasing lean protein intake, decreasing simple carbohydrates , decrease eating out, meal planning , increase water  intake, better snacking choices, planning for success, increasing vegetables, avoiding temptations, keep healthy foods in the home, weigh protein portions, measure portion sizes, and mindful eating.  Marvin [name] has agreed to follow-up with our clinic in 2 months     Objective:   VITALS: Per patient if applicable, see vitals. GENERAL: Alert and in no acute distress. CARDIOPULMONARY: No increased WOB. Speaking in clear sentences.  PSYCH: Pleasant and cooperative. Speech normal rate and rhythm. Affect is appropriate. Insight and judgement are appropriate. Attention is focused, linear, and appropriate.  NEURO: Oriented as arrived  to appointment on time with no prompting.   Attestation Statements:   This was prepared with the assistance of Engineer, Civil (consulting).  Occasional wrong-word or sound-a-like substitutions may have occurred due to the inherent limitations of voice recognition.   Clayborne Daring, DO   "

## 2024-03-07 NOTE — Progress Notes (Signed)
 F/u with xu to go over mri

## 2024-03-07 NOTE — Progress Notes (Signed)
 Called and scheduled

## 2024-03-10 LAB — OPHTHALMOLOGY REPORT-SCANNED

## 2024-03-15 ENCOUNTER — Ambulatory Visit: Admitting: Orthopaedic Surgery

## 2024-03-15 DIAGNOSIS — M25511 Pain in right shoulder: Secondary | ICD-10-CM | POA: Diagnosis not present

## 2024-03-15 DIAGNOSIS — G8929 Other chronic pain: Secondary | ICD-10-CM

## 2024-03-15 NOTE — Progress Notes (Signed)
 "  Office Visit Note   Patient: Marvin [name]           Date of Birth: 1950-11-05           MRN: [id number] Visit Date: 03/15/2024              Requested by: Marvin Roselie Rockford, NP [address] [place],  [place] [address] PCP: Marvin Roselie Rockford, NP   Assessment & Plan: Visit Diagnoses:  1. 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  Treatment options were discussed.  The best course of action at this time is to placed him in physical therapy to improve strength and function which is his main issue.  Follow-up as needed.  Follow-Up Instructions: Return if symptoms worsen or fail to improve.   Orders:  Orders Placed This Encounter  Procedures   Ambulatory referral to Physical Therapy   No orders of the defined types were placed in this encounter.     Procedures: No procedures performed   Clinical Data: No additional findings.   Subjective: Chief Complaint  Patient presents with   Right Shoulder - Follow-up    HPI Marvin [name] returns today for MRI review. Review of Systems  Constitutional: Negative.   HENT: Negative.    Eyes: Negative.   Respiratory: Negative.    Cardiovascular: Negative.   Gastrointestinal: Negative.   Endocrine: Negative.   Genitourinary: Negative.   Skin: Negative.   Allergic/Immunologic: Negative.   Neurological: Negative.   Hematological: Negative.   Psychiatric/Behavioral: Negative.    All other systems reviewed and are negative.    Objective: Vital Signs: There were no vitals taken for this visit.  Physical Exam Vitals and nursing note reviewed.  Constitutional:      Appearance: He is well-developed.  HENT:     Head: Normocephalic and atraumatic.  Eyes:     Pupils: Pupils are equal, round, and reactive to light.  Pulmonary:     Effort: Pulmonary effort is normal.  Abdominal:      Palpations: Abdomen is soft.  Musculoskeletal:        General: Normal range of motion.     Cervical back: Neck supple.  Skin:    General: Skin is warm.  Neurological:     Mental Status: He is alert and oriented to person, place, and time.  Psychiatric:        Behavior: Behavior normal.        Thought Content: Thought content normal.        Judgment: Judgment normal.     Ortho Exam Examination of the right shoulder shows weakness to manual muscle testing the supraspinatus and infraspinatus with abduction and forward flexion. Specialty Comments:  No specialty comments available.  Imaging: No results found.   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    Swallowing difficulty    Wears dentures    bottom front    Family History  Problem Relation Age of Onset   Healthy Mother    Healthy Father    Cancer Brother        bone cancer   Cancer Maternal Aunt        breast cancer   Cancer Maternal Uncle        lukemia   Cancer Cousin        unknown   Cancer Maternal Uncle        lukemia   Cancer Paternal Uncle        prostate   Hearing loss Maternal Grandfather 43    Past Surgical History:  Procedure Laterality Date   HERNIA REPAIR     twice   INCISIONAL HERNIA REPAIR N/A 08/07/2023   Procedure: REPAIR, HERNIA, INCISIONAL;  Surgeon: Sheldon Standing, MD;  Location: [facility];  Service: General;  Laterality: N/A;  INCISIONAL HERNIA REPAIR   INGUINAL HERNIA REPAIR Left 07/15/2022   Procedure: OPEN LEFT INGUINAL HERNIA REPAIR;  Surgeon: Gladis Cough, MD;  Location: [facility];  Service: General;  Laterality: Left;   JOINT REPLACEMENT     left inguinal hernia repair      left shoulder rotator cuff repair      REPAIR, HERNIA, INGUINAL, BILATERAL, ROBOT-ASSISTED Left 08/07/2023   Procedure: REPAIR, HERNIA, INGUINAL, BILATERAL, ROBOT-ASSISTED;  Surgeon: Sheldon Standing, MD;  Location: [facility];  Service: General;  Laterality: Left;  ROBOTIC LEFT AND POSSIBLE RIGHT INGUINAL HERNIA REPAIRS   REPLACEMENT TOTAL KNEE     partial   right knee replacement      SHOULDER SURGERY     right   TOTAL HIP ARTHROPLASTY Right 10/03/2019   Procedure: RIGHT TOTAL HIP ARTHROPLASTY ANTERIOR APPROACH;  Surgeon: Jerri Kay HERO, MD;  Location: [facility];  Service: Orthopedics;  Laterality: Right;   UMBILICAL HERNIA REPAIR     x 2   Social History   Occupational History   Not on file  Tobacco Use   Smoking status: Former    Current packs/day: 0.00    Types: Cigarettes    Quit date: 03/20/1998    Years since quitting: 26.0   Smokeless tobacco: Former    Types: Associate Professor status: Never Used  Substance and Sexual Activity   Alcohol  use: Not Currently     Comment: hx of 23 years ago   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        "

## 2024-03-17 ENCOUNTER — Ambulatory Visit: Admitting: Physical Therapy

## 2024-03-17 ENCOUNTER — Encounter: Payer: Self-pay | Admitting: Physical Therapy

## 2024-03-17 DIAGNOSIS — G8929 Other chronic pain: Secondary | ICD-10-CM

## 2024-03-17 DIAGNOSIS — R293 Abnormal posture: Secondary | ICD-10-CM

## 2024-03-17 DIAGNOSIS — M25611 Stiffness of right shoulder, not elsewhere classified: Secondary | ICD-10-CM

## 2024-03-17 DIAGNOSIS — M25511 Pain in right shoulder: Secondary | ICD-10-CM | POA: Diagnosis not present

## 2024-03-17 DIAGNOSIS — M6281 Muscle weakness (generalized): Secondary | ICD-10-CM | POA: Diagnosis not present

## 2024-03-17 NOTE — Therapy (Signed)
 " OUTPATIENT PHYSICAL THERAPY EVALUATION   Patient Name: Marvin [name] MRN: [id number] DOB:12/31/1950, 74 y.o., male Today's Date: 03/17/2024  END OF SESSION:  PT End of Session - 03/17/24 1349     Visit Number 1    Number of Visits 12    Date for Recertification  04/28/24    Authorization Type [employer] and [employer]    Progress Note Due on Visit 10    PT Start Time 1300    PT Stop Time 1340    PT Time Calculation (min) 40 min    Activity Tolerance Patient tolerated treatment well    Behavior During Therapy WFL for tasks assessed/performed          Past Medical History:  Diagnosis Date   Alcohol  abuse    Allergy    Arthritis    Back pain    Cataract    Vision cloudy in lelf eye. Had eye exam on July 25, 2020   Constipation 03/15/2020   Drug abuse (HCC)    GERD (gastroesophageal reflux disease)    Gout    Gout 05/20/2016   Hernia    Hypercholesteremia    Hypertension    Joint pain    Obesity    Osteoarthritis    Pre-diabetes    Prediabetes    Stomach ulcer    Substance abuse (HCC) 06/14/1998   In  recovery 40yrs.   Swallowing difficulty    Wears dentures    bottom front   Past Surgical History:  Procedure Laterality Date   HERNIA REPAIR     twice   INCISIONAL HERNIA REPAIR N/A 08/07/2023   Procedure: REPAIR, HERNIA, INCISIONAL;  Surgeon: Sheldon Standing, MD;  Location: [facility];  Service: General;  Laterality: N/A;  INCISIONAL HERNIA REPAIR   INGUINAL HERNIA REPAIR Left 07/15/2022   Procedure: OPEN LEFT INGUINAL HERNIA REPAIR;  Surgeon: Gladis Cough, MD;  Location: [facility];  Service: General;  Laterality: Left;   JOINT REPLACEMENT     left inguinal hernia repair      left shoulder rotator cuff repair      REPAIR, HERNIA, INGUINAL, BILATERAL, ROBOT-ASSISTED Left 08/07/2023   Procedure: REPAIR, HERNIA, INGUINAL, BILATERAL, ROBOT-ASSISTED;  Surgeon: Sheldon Standing, MD;  Location: [facility];  Service: General;  Laterality: Left;  ROBOTIC LEFT AND POSSIBLE RIGHT  INGUINAL HERNIA REPAIRS   REPLACEMENT TOTAL KNEE     partial   right knee replacement      SHOULDER SURGERY     right   TOTAL HIP ARTHROPLASTY Right 10/03/2019   Procedure: RIGHT TOTAL HIP ARTHROPLASTY ANTERIOR APPROACH;  Surgeon: Jerri Kay HERO, MD;  Location: [facility];  Service: Orthopedics;  Laterality: Right;   UMBILICAL HERNIA REPAIR     x 2   Patient Active Problem List   Diagnosis Date Noted   Ventral hernia without obstruction or gangrene 08/23/2023   Hernia of anterior abdominal wall 08/23/2023   BMI 32.0-32.9,adult 03/26/2022   Myalgia due to statin 05/13/2021   Vitamin D  deficiency 07/05/2020   Constipation 03/15/2020   Diverticular disease of colon 03/15/2020   S/P hernia repair 03/15/2020   Primary osteoarthritis of right hip 10/03/2019   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   Chronic bilateral low back pain without sciatica 02/21/2019   DM (diabetes mellitus) (HCC) 08/25/2017   Hyperuricemia 08/25/2017   Dyspepsia 02/23/2017   Gout 05/20/2016   HTN (hypertension) 09/11/2015   Hyperlipidemia associated with type 2 diabetes mellitus (HCC) 09/11/2015   Hyperlipidemia  09/11/2015    PCP: Katheen Roselie Rockford, NP  REFERRING PROVIDER: Jerri Kay HERO, MD  REFERRING DIAG: [phone] (ICD-10-CM) - Chronic right shoulder pain   Rationale for Evaluation and Treatment: Rehabilitation  THERAPY DIAG:  Chronic right shoulder pain - Plan: PT plan of care cert/re-cert  Muscle weakness (generalized) - Plan: PT plan of care cert/re-cert  Stiffness of right shoulder, not elsewhere classified - Plan: PT plan of care cert/re-cert  Abnormal posture - Plan: PT plan of care cert/re-cert  ONSET DATE: 3-4 months ago    SUBJECTIVE:                                                                                                                                                                                           SUBJECTIVE STATEMENT: Patient enjoys fishing  and noticed pain during activity with casting fishing rod. Cortisone shots lasted for a month. Working more now with lots of boxes. Incident at work with reaching for box. Had ultrasound done and has chronic RTC issue.  PERTINENT HISTORY:  OA, hx ETOH and drug abuse, HTN, obesity, Lt RTC repair, Rt THA, gout, DM  PAIN:  Are you having pain? Yes: NPRS scale: 8/10 with increased activity; 4/10 Pain location: Shoulder to elbow area; bicep rupture 30 years ago in this area Pain description: Fatigued,  Aggravating factors: Doing hair; sleeping;lifting ; ADLs Relieving factors: OTC (tylenol ); Ice & Heat  PRECAUTIONS:  None  RED FLAGS: None   WEIGHT BEARING RESTRICTIONS:  No  FALLS:  Has patient fallen in last 6 months? No  LIVING ENVIRONMENT: Lives with: lives with their family and lives with their spouse Lives in: House/apartment   OCCUPATION:  [employer] (Semi-Retired; light duty)   PLOF:  Independent  PATIENT GOALS:  Increase strength  Decrease pain  OBJECTIVE:  Note: Objective measures were completed at Evaluation unless otherwise noted.  DIAGNOSTIC FINDINGS:  MRI: IMPRESSION: 1. Complete full-thickness full-width retracted tears of the supraspinatus and infraspinatus tendons with retraction to the level of the humeral head. 2. Mild-to-moderate atrophy of the supraspinatus muscle. Mild atrophy of the infraspinatus muscle. Increased T2 signal extending along the retracted myotendinous junctions of the torn supraspinatus and infraspinatus tendons, may reflect posttraumatic edema and/or denervation change. 3. Increased T2 signal within the deltoid musculature could reflect strain or denervation change. 4. Severely attenuated intra-articular biceps tendon. 5. Degeneration and tear of the superior labrum extending anteriorly and posteriorly. 6. Moderate to advanced degenerative arthropathy of the acromioclavicular joint. 7. Mild degenerative changes of the glenohumeral  joint.  PATIENT SURVEYS:  Patient-Specific Activity Scoring Scheme  0 represents unable to perform. 10 represents able to  perform at prior level. 0 1 2 3 4 5 6 7 8 9  10 (Date and Score)   Activity Eval     1. Picking up 10# or more 3    2. Reaching overhead for long periods 3    3. Holding Rt arm out for long periods 3   4. Lying on Rt side 2   5. Push ups 0   Score 2.2    Total score = sum of the activity scores/number of activities Minimum detectable change (90%CI) for average score = 2 points Minimum detectable change (90%CI) for single activity score = 3 points    COGNITIVE STATUS: Within functional limits for tasks assessed   SENSATION: WFL Reports tingling at times  POSTURE:  rounded shoulders and forward head  HAND DOMINANCE:  Right  GAIT: 03/17/24 Comments: Independent   PALPATION: 03/17/24 tenderness proximal biceps tendon Possible Popeye deformity bil   UPPER EXTREMITY ROM:  Eval: all tested in sitting  ROM Right eval Left eval  Shoulder flexion A: 133 P: 142 A: 145  Shoulder abduction A: 110 P: 130 A: 132  Shoulder internal rotation FIR: L1 FIR: T10  Shoulder external rotation A: 40 A: 40   (Blank rows = not tested)   UPPER EXTREMITY MMT:  MMT Right eval Left eval  Shoulder flexion 3+/5 with pain 5/5  Shoulder abduction 3-/5 with pain 4-/5  Shoulder internal rotation 3-/5 with pain 4/5  Shoulder external rotation 4-/5 4/5   (Blank rows = not tested)    SPECIAL TESTS:  03/17/24 Upper Extremity Rotator cuff assessment: Empty can test: positive  and Full can test: positive    TREATMENT:                                                                                                                              DATE:  03/17/24 TherEx See HEP - demonstrated with trial reps performed PRN, mod cues for comprehension    Self Care Educated on PT POC and clinical findings     PATIENT EDUCATION:  Education details:  HEP Person educated: Patient Education method: Programmer, Multimedia, Facilities Manager, and Handouts Education comprehension: verbalized understanding, returned demonstration, and needs further education  HOME EXERCISE PROGRAM: Access Code: [email or web address] URL: [email or web address] Date: 03/17/2024 Prepared by: Corean Ku  Exercises - Supine Shoulder Flexion with Dowel  - 2 x daily - 7 x weekly - 2 sets - 10 reps - 3-5 sec hold - Supine Shoulder Abduction AAROM with Dowel  - 2 x daily - 7 x weekly - 2 sets - 10 reps - Supine Shoulder External Rotation in 45 Degrees Abduction AAROM with Dowel  - 2 x daily - 7 x weekly - 2 sets - 10 reps - Standing Scapular Retraction  - 2 x daily - 7 x weekly - 2 sets - 10 reps - 5 sec hold   ASSESSMENT:  CLINICAL IMPRESSION: Patient is a 74 y.o. male who was seen today  for physical therapy evaluation and treatment for Rt shoulder pain. He demonstrates decreased ROM and strength as well as continued pain and postural abnormalities affecting functional mobility.  He will benefit from PT to address deficits listed.   OBJECTIVE IMPAIRMENTS: decreased ROM, decreased strength, impaired tone, impaired UE functional use, postural dysfunction, and pain.   ACTIVITY LIMITATIONS: carrying, lifting, bending, sleeping, bed mobility, reach over head, and hygiene/grooming  PARTICIPATION LIMITATIONS: cleaning, laundry, driving, community activity, and occupation  PERSONAL FACTORS: Age, Past/current experiences, Time since onset of injury/illness/exacerbation, and 3+ comorbidities: OA, hx ETOH and drug abuse, HTN, obesity, Lt RTC repair, Rt THA, gout, DM are also affecting patient's functional outcome.   REHAB POTENTIAL: Good  CLINICAL DECISION MAKING: Evolving/moderate complexity  EVALUATION COMPLEXITY: Moderate   GOALS: Goals reviewed with patient? Yes  SHORT TERM GOALS: Target date: 04/07/2024  Independent with initial HEP Goal status: INITIAL  2.   Rt shoulder flexion and abduction AROM improved 10 deg for improved function Goal status: INITIAL  3.  Report pain with reaching < 4/10 Goal status: INITIAL   LONG TERM GOALS: Target date: 04/28/2024  Independent with final HEP Goal status: INITIAL  2.  PSFS score improved by 2 points Goal status: INITIAL  3.  Rt shoulder AROM within 3 deg of Lt shoulder for improved function and ADLs Goal status: INIITAL  4.  Report pain < 3/10 with reaching and fishing for improved function Goal status: INITIAL  5.  Report 50% improvement in sleeping on Rt side. Goal status: INITIAL  PLAN:  PT FREQUENCY: 1-2x/week  PT DURATION: 6 weeks  PLANNED INTERVENTIONS: [id number]- PT Re-evaluation, 97750- Physical Performance Testing, 97110-Therapeutic exercises, 97530- Therapeutic activity, [id number]- Neuromuscular re-education, 97535- Self Care, [id number]- Manual therapy, [phone]- Aquatic Therapy, [id number]- Electrical stimulation (unattended), [id number]- Ultrasound, [id number]- Ionotophoresis 4mg /ml Dexamethasone , 79439 (1-2 muscles), [id number] (3+ muscles)- Dry Needling, Patient/Family education, Taping, Joint mobilization, Cryotherapy, and Moist heat.  PLAN FOR NEXT SESSION: Review HEP, maximize ROM, work on decreasing pain, strength when able   NEXT MD VISIT: PRN   Corean JULIANNA Ku, PT, DPT 03/17/24 1:50 PM   "

## 2024-03-23 ENCOUNTER — Other Ambulatory Visit

## 2024-03-23 DIAGNOSIS — R972 Elevated prostate specific antigen [PSA]: Secondary | ICD-10-CM

## 2024-03-23 DIAGNOSIS — E1169 Type 2 diabetes mellitus with other specified complication: Secondary | ICD-10-CM

## 2024-03-23 LAB — HEMOGLOBIN A1C: Hgb A1c MFr Bld: 6.2 % (ref 4.6–6.5)

## 2024-03-23 LAB — PSA: PSA: 2.77 ng/mL (ref 0.10–4.00)

## 2024-03-24 ENCOUNTER — Ambulatory Visit: Payer: Self-pay | Admitting: Nurse Practitioner

## 2024-03-24 DIAGNOSIS — R972 Elevated prostate specific antigen [PSA]: Secondary | ICD-10-CM

## 2024-04-07 ENCOUNTER — Ambulatory Visit: Admitting: Orthopaedic Surgery

## 2024-05-02 ENCOUNTER — Ambulatory Visit: Admitting: Bariatrics

## 2024-05-30 ENCOUNTER — Ambulatory Visit: Admitting: Nurse Practitioner

## 2024-06-06 ENCOUNTER — Ambulatory Visit: Admitting: Nurse Practitioner

## 2024-12-16 ENCOUNTER — Ambulatory Visit
